# Patient Record
Sex: Female | Born: 1940 | Race: White | Hispanic: No | Smoking: Never smoker
Health system: Southern US, Community
[De-identification: ages and names within clinical notes are randomized; demographics above are authoritative.]

## PROBLEM LIST (undated history)

## (undated) DIAGNOSIS — C189 Malignant neoplasm of colon, unspecified: Secondary | ICD-10-CM

## (undated) DIAGNOSIS — I739 Peripheral vascular disease, unspecified: Secondary | ICD-10-CM

## (undated) DIAGNOSIS — F419 Anxiety disorder, unspecified: Secondary | ICD-10-CM

## (undated) DIAGNOSIS — E119 Type 2 diabetes mellitus without complications: Secondary | ICD-10-CM

## (undated) DIAGNOSIS — I10 Essential (primary) hypertension: Secondary | ICD-10-CM

## (undated) HISTORY — PX: TONSILLECTOMY: SUR1361

## (undated) HISTORY — PX: COLON SURGERY: SHX602

## (undated) SURGERY — ABDOMINAL AORTOGRAM W/LOWER EXTREMITY
Anesthesia: Moderate Sedation

---

## 2005-12-11 ENCOUNTER — Ambulatory Visit: Payer: Self-pay | Admitting: Internal Medicine

## 2007-10-14 ENCOUNTER — Ambulatory Visit: Payer: Self-pay | Admitting: Internal Medicine

## 2008-03-06 ENCOUNTER — Emergency Department: Payer: Self-pay | Admitting: Unknown Physician Specialty

## 2009-09-23 ENCOUNTER — Ambulatory Visit: Payer: Self-pay | Admitting: Internal Medicine

## 2010-06-28 ENCOUNTER — Emergency Department: Payer: Self-pay | Admitting: Emergency Medicine

## 2010-07-03 ENCOUNTER — Ambulatory Visit: Payer: Self-pay | Admitting: Surgery

## 2010-07-04 ENCOUNTER — Inpatient Hospital Stay: Payer: Self-pay | Admitting: Internal Medicine

## 2010-07-06 LAB — CEA: CEA: 20.4 ng/mL — ABNORMAL HIGH (ref 0.0–4.7)

## 2010-07-07 ENCOUNTER — Ambulatory Visit: Payer: Self-pay | Admitting: Surgery

## 2010-07-08 ENCOUNTER — Ambulatory Visit: Payer: Self-pay | Admitting: Internal Medicine

## 2010-07-11 LAB — PATHOLOGY REPORT

## 2010-07-27 ENCOUNTER — Ambulatory Visit: Payer: Self-pay | Admitting: Internal Medicine

## 2010-08-01 ENCOUNTER — Ambulatory Visit: Payer: Self-pay | Admitting: Internal Medicine

## 2010-08-02 ENCOUNTER — Ambulatory Visit: Payer: Self-pay | Admitting: Surgery

## 2010-08-08 ENCOUNTER — Ambulatory Visit: Payer: Self-pay | Admitting: Internal Medicine

## 2010-09-07 ENCOUNTER — Ambulatory Visit: Payer: Self-pay | Admitting: Internal Medicine

## 2010-09-12 ENCOUNTER — Ambulatory Visit: Payer: Self-pay | Admitting: Internal Medicine

## 2010-10-08 ENCOUNTER — Ambulatory Visit: Payer: Self-pay | Admitting: Internal Medicine

## 2010-10-26 LAB — CEA: CEA: 2.9 ng/mL (ref 0.0–4.7)

## 2010-11-08 ENCOUNTER — Ambulatory Visit: Payer: Self-pay | Admitting: Internal Medicine

## 2010-12-07 ENCOUNTER — Ambulatory Visit: Payer: Self-pay | Admitting: Internal Medicine

## 2011-01-07 ENCOUNTER — Ambulatory Visit: Payer: Self-pay | Admitting: Internal Medicine

## 2011-01-23 ENCOUNTER — Ambulatory Visit: Payer: Self-pay | Admitting: Unknown Physician Specialty

## 2011-01-24 LAB — PATHOLOGY REPORT

## 2011-02-06 ENCOUNTER — Ambulatory Visit: Payer: Self-pay | Admitting: Internal Medicine

## 2011-03-09 ENCOUNTER — Ambulatory Visit: Payer: Self-pay | Admitting: Internal Medicine

## 2011-04-08 ENCOUNTER — Ambulatory Visit: Payer: Self-pay | Admitting: Internal Medicine

## 2011-04-09 ENCOUNTER — Emergency Department: Payer: Self-pay | Admitting: Emergency Medicine

## 2011-05-05 IMAGING — CT NM PET TUM IMG INITIAL (PI) SKULL BASE T - THIGH
1 of 5 series · 1 of 25 positions shown · non-contrast
Comparison: none

REASON FOR EXAM: eval for liver METS
COMMENTS:

PROCEDURE:     PET - PET/CT INIT STAGING COLORECTAL  - August 01, 2010 [DATE]
RESULT:
HISTORY: Colorectal cancer.

[Series 3: ct wb 3.0 b30f · axial · 3.0mm · 0.98mm/px · 1 of 376 slices shown]
[im 334/376  brain]
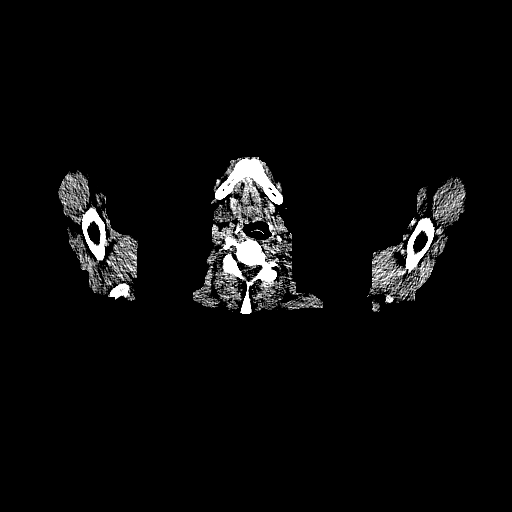

[1 of 25 positions shown; findings below may reference images not displayed]

Comparison Study: No prior.

Procedure and Findings: Following termination of a fasting blood sugar of
107 mg/dl, 14.42 mCi of F-18 FDG was administered to the patient and PET CT
obtained. CT was obtained for attenuation, correction and fusion. Comparison
is made to prior CT of 06/28/2010. Noted in the right lobe of the thyroid is
an intensely PET positive nodule with an SUV of approximately 6.2. Thyroid
malignancy cannot be excluded and further evaluation is suggested. A PET
positive small right adrenal nodular density with an SUV approximately 3 is
noted. Although this could represent adrenal adenoma an adrenal malignancy
cannot be excluded. Further characterization with MRI of the adrenal is
suggested.
IMPRESSION: 1. Intensely PET positive right thyroid lesion for which further evaluation
should be considered. Thyroid malignancy cannot be excluded.
2. Faintly PET positive right adrenal lesion. Although this could represent
adrenal adenoma an adrenal malignancy cannot be excluded and further
characterization with MRI is suggested.
3. No other significant focal abnormalities are identified. The liver
demonstrates no PET positive abnormalities. The previously identified
hepatic lesion on abdominal CT of 06/28/2010 can be followed with CT.

## 2011-05-09 ENCOUNTER — Ambulatory Visit: Payer: Self-pay | Admitting: Internal Medicine

## 2011-06-09 ENCOUNTER — Ambulatory Visit: Payer: Self-pay | Admitting: Internal Medicine

## 2011-07-04 LAB — CEA: CEA: 2.6 ng/mL (ref 0.0–4.7)

## 2011-07-09 ENCOUNTER — Ambulatory Visit: Payer: Self-pay | Admitting: Internal Medicine

## 2011-07-13 ENCOUNTER — Ambulatory Visit: Payer: Self-pay | Admitting: Otolaryngology

## 2011-08-09 ENCOUNTER — Ambulatory Visit: Payer: Self-pay | Admitting: Internal Medicine

## 2011-09-08 ENCOUNTER — Ambulatory Visit: Payer: Self-pay | Admitting: Internal Medicine

## 2011-10-11 ENCOUNTER — Ambulatory Visit: Payer: Self-pay | Admitting: Internal Medicine

## 2011-11-09 ENCOUNTER — Ambulatory Visit: Payer: Self-pay | Admitting: Internal Medicine

## 2011-11-09 ENCOUNTER — Ambulatory Visit: Payer: Self-pay | Admitting: Oncology

## 2011-11-13 LAB — COMPREHENSIVE METABOLIC PANEL
Anion Gap: 8 (ref 7–16)
BUN: 12 mg/dL (ref 7–18)
Calcium, Total: 8.7 mg/dL (ref 8.5–10.1)
Chloride: 106 mmol/L (ref 98–107)
Creatinine: 0.86 mg/dL (ref 0.60–1.30)
EGFR (African American): >60
Osmolality: 284 (ref 275–301)
Potassium: 4.6 mmol/L (ref 3.5–5.1)
SGOT(AST): 27 U/L (ref 15–37)
Total Protein: 6.8 g/dL (ref 6.4–8.2)

## 2011-11-13 LAB — CBC CANCER CENTER
Basophil #: 0 x10 3/mm (ref 0.0–0.1)
Basophil %: 0.5 %
Eosinophil %: 1.1 %
HGB: 11.1 g/dL — ABNORMAL LOW (ref 12.0–16.0)
Lymphocyte #: 1.8 x10 3/mm (ref 1.0–3.6)
Lymphocyte %: 23.6 %
MCH: 29.4 pg (ref 26.0–34.0)
MCV: 87 fL (ref 80–100)
Monocyte #: 0.3 x10 3/mm (ref 0.0–0.7)
Monocyte %: 4.4 %
Neutrophil %: 70.4 %
Platelet: 261 x10 3/mm (ref 150–440)
RDW: 13.3 % (ref 11.5–14.5)
WBC: 7.9 x10 3/mm (ref 3.6–11.0)

## 2011-11-14 LAB — CEA: CEA: 2.8 ng/mL (ref 0.0–4.7)

## 2011-12-07 ENCOUNTER — Ambulatory Visit: Payer: Self-pay | Admitting: Oncology

## 2011-12-07 ENCOUNTER — Ambulatory Visit: Payer: Self-pay | Admitting: Internal Medicine

## 2012-01-07 ENCOUNTER — Ambulatory Visit: Payer: Self-pay | Admitting: Internal Medicine

## 2012-02-05 LAB — HEPATIC FUNCTION PANEL A (ARMC)
Bilirubin, Direct: <0.1 mg/dL (ref 0.00–0.20)
SGOT(AST): 20 U/L (ref 15–37)
Total Protein: 7 g/dL (ref 6.4–8.2)

## 2012-02-05 LAB — CBC CANCER CENTER
Basophil %: 0.9 %
Eosinophil #: 0.2 x10 3/mm (ref 0.0–0.7)
Eosinophil %: 2.3 %
Lymphocyte #: 2.2 x10 3/mm (ref 1.0–3.6)
Lymphocyte %: 28.1 %
MCHC: 32.6 g/dL (ref 32.0–36.0)
Monocyte #: 0.4 x10 3/mm (ref 0.2–0.9)
Neutrophil %: 63.3 %
Platelet: 247 x10 3/mm (ref 150–440)
RBC: 4.03 10*6/uL (ref 3.80–5.20)

## 2012-02-05 LAB — CREATININE, SERUM: EGFR (African American): >60

## 2012-02-06 ENCOUNTER — Ambulatory Visit: Payer: Self-pay | Admitting: Internal Medicine

## 2012-02-06 LAB — CEA: CEA: 2.6 ng/mL (ref 0.0–4.7)

## 2012-03-18 ENCOUNTER — Ambulatory Visit: Payer: Self-pay | Admitting: Internal Medicine

## 2012-04-07 ENCOUNTER — Ambulatory Visit: Payer: Self-pay | Admitting: Internal Medicine

## 2012-06-10 ENCOUNTER — Ambulatory Visit: Payer: Self-pay | Admitting: Internal Medicine

## 2012-06-10 LAB — CBC CANCER CENTER
Basophil #: 0.1 x10 3/mm (ref 0.0–0.1)
Basophil %: 0.6 %
Eosinophil %: 0.7 %
HCT: 35.8 % (ref 35.0–47.0)
HGB: 11.5 g/dL — ABNORMAL LOW (ref 12.0–16.0)
Lymphocyte #: 2 x10 3/mm (ref 1.0–3.6)
Lymphocyte %: 19 %
MCH: 27.8 pg (ref 26.0–34.0)
MCHC: 32.2 g/dL (ref 32.0–36.0)
Monocyte %: 4.1 %
Neutrophil #: 7.9 x10 3/mm — ABNORMAL HIGH (ref 1.4–6.5)
Neutrophil %: 75.6 %
RBC: 4.15 10*6/uL (ref 3.80–5.20)
RDW: 14.2 % (ref 11.5–14.5)
WBC: 10.5 x10 3/mm (ref 3.6–11.0)

## 2012-06-10 LAB — HEPATIC FUNCTION PANEL A (ARMC)
Albumin: 3.4 g/dL (ref 3.4–5.0)
Bilirubin, Direct: <0.1 mg/dL (ref 0.00–0.20)
Bilirubin,Total: 0.4 mg/dL (ref 0.2–1.0)

## 2012-06-10 LAB — CREATININE, SERUM: EGFR (Non-African Amer.): 53 — ABNORMAL LOW

## 2012-06-13 LAB — CEA: CEA: 2.4 ng/mL (ref 0.0–4.7)

## 2012-07-08 ENCOUNTER — Ambulatory Visit: Payer: Self-pay | Admitting: Internal Medicine

## 2012-07-09 ENCOUNTER — Ambulatory Visit: Payer: Self-pay | Admitting: Internal Medicine

## 2012-07-10 ENCOUNTER — Ambulatory Visit: Payer: Self-pay | Admitting: Internal Medicine

## 2012-07-18 ENCOUNTER — Ambulatory Visit: Payer: Self-pay | Admitting: Otolaryngology

## 2012-11-24 ENCOUNTER — Ambulatory Visit: Payer: Self-pay | Admitting: Internal Medicine

## 2012-11-25 ENCOUNTER — Ambulatory Visit: Payer: Self-pay | Admitting: Internal Medicine

## 2012-11-25 LAB — HEPATIC FUNCTION PANEL A (ARMC)
Bilirubin, Direct: <0.05 mg/dL (ref 0.00–0.20)
Bilirubin,Total: 0.2 mg/dL (ref 0.2–1.0)

## 2012-11-25 LAB — CBC CANCER CENTER
Basophil #: 0.1 x10 3/mm (ref 0.0–0.1)
Eosinophil #: 0.1 x10 3/mm (ref 0.0–0.7)
HCT: 38.9 % (ref 35.0–47.0)
HGB: 12.9 g/dL (ref 12.0–16.0)
MCH: 28.2 pg (ref 26.0–34.0)
Monocyte #: 0.4 x10 3/mm (ref 0.2–0.9)
Monocyte %: 4.3 %
Neutrophil #: 6.4 x10 3/mm (ref 1.4–6.5)
Platelet: 276 x10 3/mm (ref 150–440)
RBC: 4.6 10*6/uL (ref 3.80–5.20)
RDW: 14.1 % (ref 11.5–14.5)
WBC: 8.9 x10 3/mm (ref 3.6–11.0)

## 2012-11-25 LAB — CREATININE, SERUM
Creatinine: 1.06 mg/dL (ref 0.60–1.30)
EGFR (African American): 60 — ABNORMAL LOW

## 2012-11-27 LAB — CEA: CEA: 2.1 ng/mL (ref 0.0–4.7)

## 2012-12-06 ENCOUNTER — Ambulatory Visit: Payer: Self-pay | Admitting: Internal Medicine

## 2013-03-26 ENCOUNTER — Ambulatory Visit: Payer: Self-pay | Admitting: Unknown Physician Specialty

## 2013-05-08 ENCOUNTER — Ambulatory Visit: Payer: Self-pay | Admitting: Internal Medicine

## 2013-05-25 LAB — HEPATIC FUNCTION PANEL A (ARMC)
Albumin: 3.3 g/dL — ABNORMAL LOW (ref 3.4–5.0)
Bilirubin,Total: 0.4 mg/dL (ref 0.2–1.0)
SGOT(AST): 13 U/L — ABNORMAL LOW (ref 15–37)
SGPT (ALT): 15 U/L (ref 12–78)
Total Protein: 6.9 g/dL (ref 6.4–8.2)

## 2013-05-25 LAB — CBC CANCER CENTER
Basophil #: 0.1 x10 3/mm (ref 0.0–0.1)
Basophil %: 0.9 %
Eosinophil %: 1.2 %
HGB: 12.5 g/dL (ref 12.0–16.0)
Lymphocyte #: 2 x10 3/mm (ref 1.0–3.6)
MCH: 29.4 pg (ref 26.0–34.0)
MCV: 85 fL (ref 80–100)
Neutrophil #: 6.7 x10 3/mm — ABNORMAL HIGH (ref 1.4–6.5)
Neutrophil %: 71.9 %
Platelet: 283 x10 3/mm (ref 150–440)
RBC: 4.25 10*6/uL (ref 3.80–5.20)
RDW: 14 % (ref 11.5–14.5)
WBC: 9.3 x10 3/mm (ref 3.6–11.0)

## 2013-05-25 LAB — CREATININE, SERUM
EGFR (African American): 53 — ABNORMAL LOW
EGFR (Non-African Amer.): 46 — ABNORMAL LOW

## 2013-05-26 LAB — CEA: CEA: 2 ng/mL (ref 0.0–4.7)

## 2013-06-08 ENCOUNTER — Ambulatory Visit: Payer: Self-pay | Admitting: Internal Medicine

## 2013-07-21 ENCOUNTER — Ambulatory Visit: Payer: Self-pay | Admitting: Otolaryngology

## 2013-12-02 ENCOUNTER — Ambulatory Visit: Payer: Self-pay | Admitting: Internal Medicine

## 2013-12-04 ENCOUNTER — Ambulatory Visit: Payer: Self-pay | Admitting: Internal Medicine

## 2013-12-04 LAB — CBC CANCER CENTER
BASOS ABS: 0.1 x10 3/mm (ref 0.0–0.1)
Basophil %: 0.6 %
EOS ABS: 0.2 x10 3/mm (ref 0.0–0.7)
Eosinophil %: 1.9 %
HCT: 41 % (ref 35.0–47.0)
HGB: 13.2 g/dL (ref 12.0–16.0)
Lymphocyte #: 2.3 x10 3/mm (ref 1.0–3.6)
Lymphocyte %: 24.7 %
MCH: 27.6 pg (ref 26.0–34.0)
MCHC: 32.3 g/dL (ref 32.0–36.0)
MCV: 85 fL (ref 80–100)
Monocyte #: 0.4 x10 3/mm (ref 0.2–0.9)
Monocyte %: 4.5 %
Neutrophil #: 6.3 x10 3/mm (ref 1.4–6.5)
Neutrophil %: 68.3 %
PLATELETS: 324 x10 3/mm (ref 150–440)
RBC: 4.8 10*6/uL (ref 3.80–5.20)
RDW: 14.1 % (ref 11.5–14.5)
WBC: 9.2 x10 3/mm (ref 3.6–11.0)

## 2013-12-04 LAB — CREATININE, SERUM
Creatinine: 1.1 mg/dL (ref 0.60–1.30)
EGFR (African American): 57 — ABNORMAL LOW
EGFR (Non-African Amer.): 49 — ABNORMAL LOW

## 2013-12-04 LAB — HEPATIC FUNCTION PANEL A (ARMC)
ALBUMIN: 3.6 g/dL (ref 3.4–5.0)
AST: 15 U/L (ref 15–37)
Alkaline Phosphatase: 112 U/L
Bilirubin, Direct: 0.1 mg/dL (ref 0.00–0.20)
Bilirubin,Total: 0.3 mg/dL (ref 0.2–1.0)
SGPT (ALT): 19 U/L (ref 12–78)
TOTAL PROTEIN: 7.6 g/dL (ref 6.4–8.2)

## 2013-12-06 ENCOUNTER — Ambulatory Visit: Payer: Self-pay | Admitting: Internal Medicine

## 2013-12-07 LAB — CEA: CEA: 2.2 ng/mL (ref 0.0–4.7)

## 2014-11-22 ENCOUNTER — Ambulatory Visit: Payer: Self-pay | Admitting: Internal Medicine

## 2014-12-02 ENCOUNTER — Ambulatory Visit: Payer: Self-pay | Admitting: Internal Medicine

## 2014-12-07 ENCOUNTER — Ambulatory Visit
Admit: 2014-12-07 | Disposition: A | Discharge status: Home / Self Care | Payer: Self-pay | Attending: Internal Medicine | Admitting: Internal Medicine

## 2015-01-21 ENCOUNTER — Other Ambulatory Visit: Payer: Self-pay | Admitting: Internal Medicine

## 2015-01-21 DIAGNOSIS — C189 Malignant neoplasm of colon, unspecified: Secondary | ICD-10-CM

## 2015-12-06 ENCOUNTER — Inpatient Hospital Stay: Payer: Medicare Other | Attending: Family Medicine

## 2015-12-06 ENCOUNTER — Inpatient Hospital Stay: Payer: Medicare Other | Admitting: Family Medicine

## 2015-12-06 ENCOUNTER — Ambulatory Visit: Payer: Self-pay | Admitting: Internal Medicine

## 2016-01-04 ENCOUNTER — Inpatient Hospital Stay
Admission: AD | Admit: 2016-01-04 | Discharge: 2016-01-06 | DRG: 253 | Disposition: A | Discharge status: Home / Self Care | Payer: Medicare Other | Source: Ambulatory Visit | Attending: Internal Medicine | Admitting: Internal Medicine

## 2016-01-04 DIAGNOSIS — E1152 Type 2 diabetes mellitus with diabetic peripheral angiopathy with gangrene: Principal | ICD-10-CM | POA: Diagnosis present

## 2016-01-04 DIAGNOSIS — L97509 Non-pressure chronic ulcer of other part of unspecified foot with unspecified severity: Secondary | ICD-10-CM

## 2016-01-04 DIAGNOSIS — Z794 Long term (current) use of insulin: Secondary | ICD-10-CM | POA: Diagnosis not present

## 2016-01-04 DIAGNOSIS — E871 Hypo-osmolality and hyponatremia: Secondary | ICD-10-CM | POA: Diagnosis present

## 2016-01-04 DIAGNOSIS — E1122 Type 2 diabetes mellitus with diabetic chronic kidney disease: Secondary | ICD-10-CM | POA: Diagnosis present

## 2016-01-04 DIAGNOSIS — N182 Chronic kidney disease, stage 2 (mild): Secondary | ICD-10-CM | POA: Diagnosis present

## 2016-01-04 DIAGNOSIS — I771 Stricture of artery: Secondary | ICD-10-CM | POA: Diagnosis present

## 2016-01-04 DIAGNOSIS — L03115 Cellulitis of right lower limb: Secondary | ICD-10-CM | POA: Diagnosis present

## 2016-01-04 DIAGNOSIS — E11621 Type 2 diabetes mellitus with foot ulcer: Secondary | ICD-10-CM | POA: Diagnosis present

## 2016-01-04 DIAGNOSIS — Z86718 Personal history of other venous thrombosis and embolism: Secondary | ICD-10-CM | POA: Diagnosis not present

## 2016-01-04 DIAGNOSIS — Z833 Family history of diabetes mellitus: Secondary | ICD-10-CM | POA: Diagnosis not present

## 2016-01-04 DIAGNOSIS — I129 Hypertensive chronic kidney disease with stage 1 through stage 4 chronic kidney disease, or unspecified chronic kidney disease: Secondary | ICD-10-CM | POA: Diagnosis present

## 2016-01-04 DIAGNOSIS — Z85038 Personal history of other malignant neoplasm of large intestine: Secondary | ICD-10-CM | POA: Diagnosis not present

## 2016-01-04 DIAGNOSIS — E1140 Type 2 diabetes mellitus with diabetic neuropathy, unspecified: Secondary | ICD-10-CM | POA: Diagnosis present

## 2016-01-04 HISTORY — DX: Essential (primary) hypertension: I10

## 2016-01-04 HISTORY — DX: Type 2 diabetes mellitus without complications: E11.9

## 2016-01-04 HISTORY — DX: Malignant neoplasm of colon, unspecified: C18.9

## 2016-01-04 LAB — BASIC METABOLIC PANEL
Anion gap: 8 (ref 5–15)
BUN: 17 mg/dL (ref 6–20)
CO2: 22 mmol/L (ref 22–32)
CREATININE: 1.1 mg/dL — AB (ref 0.44–1.00)
Calcium: 8.6 mg/dL — ABNORMAL LOW (ref 8.9–10.3)
Chloride: 102 mmol/L (ref 101–111)
GFR calc Af Amer: 55 mL/min — ABNORMAL LOW (ref >60)
GFR, EST NON AFRICAN AMERICAN: 47 mL/min — AB (ref >60)
GLUCOSE: 153 mg/dL — AB (ref 65–99)
POTASSIUM: 3.7 mmol/L (ref 3.5–5.1)
SODIUM: 132 mmol/L — AB (ref 135–145)

## 2016-01-04 LAB — CBC
HEMATOCRIT: 34.4 % — AB (ref 35.0–47.0)
Hemoglobin: 11.3 g/dL — ABNORMAL LOW (ref 12.0–16.0)
MCH: 26.8 pg (ref 26.0–34.0)
MCHC: 32.8 g/dL (ref 32.0–36.0)
MCV: 81.8 fL (ref 80.0–100.0)
PLATELETS: 547 10*3/uL — AB (ref 150–440)
RBC: 4.2 MIL/uL (ref 3.80–5.20)
RDW: 14.4 % (ref 11.5–14.5)
WBC: 17.6 10*3/uL — AB (ref 3.6–11.0)

## 2016-01-04 LAB — GLUCOSE, CAPILLARY: Glucose-Capillary: 220 mg/dL — ABNORMAL HIGH (ref 65–99)

## 2016-01-04 LAB — SURGICAL PCR SCREEN
MRSA, PCR: NEGATIVE
STAPHYLOCOCCUS AUREUS: NEGATIVE

## 2016-01-04 MED ORDER — VANCOMYCIN HCL IN DEXTROSE 1-5 GM/200ML-% IV SOLN
1000.0000 mg | INTRAVENOUS | Status: DC
  Administered 2016-01-05 – 2016-01-06 (×2): 1000 mg via INTRAVENOUS
  Filled 2016-01-04 (×3): qty 200

## 2016-01-04 MED ORDER — ONDANSETRON HCL 4 MG/2ML IJ SOLN: 4.0000 mg | Freq: Four times a day (QID) | INTRAMUSCULAR | Status: DC | PRN

## 2016-01-04 MED ORDER — PIPERACILLIN-TAZOBACTAM 3.375 G IVPB
3.3750 g | Freq: Once | INTRAVENOUS | Status: AC
  Administered 2016-01-04: 3.375 g via INTRAVENOUS
  Filled 2016-01-04: qty 50

## 2016-01-04 MED ORDER — ENOXAPARIN SODIUM 30 MG/0.3ML ~~LOC~~ SOLN: 30.0000 mg | SUBCUTANEOUS | Status: DC

## 2016-01-04 MED ORDER — DOCUSATE SODIUM 100 MG PO CAPS
100.0000 mg | ORAL_CAPSULE | Freq: Two times a day (BID) | ORAL | Status: DC
  Administered 2016-01-04 – 2016-01-06 (×4): 100 mg via ORAL
  Filled 2016-01-04 (×4): qty 1

## 2016-01-04 MED ORDER — VANCOMYCIN HCL IN DEXTROSE 1-5 GM/200ML-% IV SOLN: 1000.0000 mg | INTRAVENOUS | Status: DC

## 2016-01-04 MED ORDER — BISACODYL 5 MG PO TBEC: 5.0000 mg | DELAYED_RELEASE_TABLET | Freq: Every day | ORAL | Status: DC | PRN

## 2016-01-04 MED ORDER — ENOXAPARIN SODIUM 40 MG/0.4ML ~~LOC~~ SOLN
40.0000 mg | SUBCUTANEOUS | Status: DC
  Administered 2016-01-05: 40 mg via SUBCUTANEOUS
  Filled 2016-01-04: qty 0.4

## 2016-01-04 MED ORDER — ACETAMINOPHEN 325 MG PO TABS: 650.0000 mg | ORAL_TABLET | Freq: Four times a day (QID) | ORAL | Status: DC | PRN

## 2016-01-04 MED ORDER — ACETAMINOPHEN 650 MG RE SUPP: 650.0000 mg | Freq: Four times a day (QID) | RECTAL | Status: DC | PRN

## 2016-01-04 MED ORDER — INSULIN ASPART 100 UNIT/ML ~~LOC~~ SOLN
0.0000 [IU] | Freq: Three times a day (TID) | SUBCUTANEOUS | Status: DC
  Administered 2016-01-05: 8 [IU] via SUBCUTANEOUS
  Administered 2016-01-05: 5 [IU] via SUBCUTANEOUS
  Administered 2016-01-05 – 2016-01-06 (×4): 3 [IU] via SUBCUTANEOUS
  Filled 2016-01-04: qty 3
  Filled 2016-01-04: qty 5
  Filled 2016-01-04: qty 3
  Filled 2016-01-04: qty 8
  Filled 2016-01-04 (×2): qty 3

## 2016-01-04 MED ORDER — HYDROCODONE-ACETAMINOPHEN 5-325 MG PO TABS: 1.0000 | ORAL_TABLET | ORAL | Status: DC | PRN

## 2016-01-04 MED ORDER — SODIUM CHLORIDE 0.9 % IV SOLN
INTRAVENOUS | Status: DC
  Administered 2016-01-04 – 2016-01-05 (×2): via INTRAVENOUS

## 2016-01-04 MED ORDER — VANCOMYCIN HCL IN DEXTROSE 1-5 GM/200ML-% IV SOLN
1000.0000 mg | INTRAVENOUS | Status: AC
  Administered 2016-01-04: 1000 mg via INTRAVENOUS
  Filled 2016-01-04: qty 200

## 2016-01-04 MED ORDER — ONDANSETRON HCL 4 MG PO TABS: 4.0000 mg | ORAL_TABLET | Freq: Four times a day (QID) | ORAL | Status: DC | PRN

## 2016-01-04 NOTE — Progress Notes (Signed)
ANTIBIOTIC CONSULT NOTE - INITIAL  Pharmacy Consult for Vancomycin and Zosyn  Indication: wound infection  No Known Allergies  Patient Measurements: Height: 5\' 2"  (157.5 cm) Weight: 156 lb 1.6 oz (70.806 kg) IBW/kg (Calculated) : 50.1 Adjusted Body Weight:   Vital Signs: Temp: 99.9 F (37.7 C) (03/29 2104) Temp Source: Oral (03/29 2104) BP: 153/49 mmHg (03/29 2104) Pulse Rate: 71 (03/29 2104) Intake/Output from previous day:   Intake/Output from this shift:    Labs:  Recent Labs  01/04/16 1804  WBC 17.6*  HGB 11.3*  PLT 547*  CREATININE 1.10*   Estimated Creatinine Clearance: 39.5 mL/min (by C-G formula based on Cr of 1.1). No results for input(s): VANCOTROUGH, VANCOPEAK, VANCORANDOM, GENTTROUGH, GENTPEAK, GENTRANDOM, TOBRATROUGH, TOBRAPEAK, TOBRARND, AMIKACINPEAK, AMIKACINTROU, AMIKACIN in the last 72 hours.   Microbiology: No results found for this or any previous visit (from the past 720 hour(s)).  Medical History: History reviewed. No pertinent past medical history.  Medications:  Prescriptions prior to admission  Medication Sig Dispense Refill Last Dose  . insulin lispro protamine-lispro (HUMALOG 75/25 MIX) (75-25) 100 UNIT/ML SUSP injection Inject 40 Units into the skin.   01/04/2016 at Unknown time  . lisinopril (PRINIVIL,ZESTRIL) 20 MG tablet Take 20 mg by mouth daily.   01/04/2016 at Unknown time  . simvastatin (ZOCOR) 20 MG tablet Take 20 mg by mouth daily.      Scheduled:  . docusate sodium  100 mg Oral BID  . enoxaparin (LOVENOX) injection  40 mg Subcutaneous Q24H  . [START ON 01/05/2016] insulin aspart  0-15 Units Subcutaneous TID WC  . piperacillin-tazobactam (ZOSYN)  IV  3.375 g Intravenous Once  . [START ON 01/05/2016] vancomycin  1,000 mg Intravenous Q24H   Assessment: Pharmacy consulted to dose and monitor Vancomycin and Zosyn therapy in this 75 year old female being treated for right great toe infection secondary to Type II DM. Podiatry consult  has been placed by hospitalist  PK parameters: Kel (hr-1): 0.033 Half-life (hrs): 21.00 Vd (liters): 49.56 (factor used: 0.7 L/kg)   Will give Vancomycin 1 g IV x 1 then will start Vancomycin 1 g IV q24 hours ~10 hours after the initial dose of Vancomycin.  Zosyn: Ordered Zosyn 3.375 g IV x 1 and Will then start Zosyn 3.375 g IV q8 hours.   Will draw 1st Vanc trough on 4/2 @ 6:00, which will not be at [facility].    Goal of Therapy:  Vancomycin Trough level target 15-20 mcg/ml.   Plan:  Follow up culture results     Myley Bahner D 01/04/2016,9:53 PM

## 2016-01-04 NOTE — Progress Notes (Signed)
ANTIBIOTIC CONSULT NOTE - INITIAL  Pharmacy Consult for Vancomycin and Zosyn  Indication: wound infection  No Known Allergies  Patient Measurements: Height: 5\' 2"  (157.5 cm) Weight: 156 lb 1.6 oz (70.806 kg) IBW/kg (Calculated) : 50.1 Adjusted Body Weight:   Vital Signs: Temp: 99 F (37.2 C) (03/29 1741) Temp Source: Oral (03/29 1741) BP: 115/67 mmHg (03/29 1741) Pulse Rate: 70 (03/29 1741) Intake/Output from previous day:   Intake/Output from this shift:    Labs:  Recent Labs  01/04/16 1804  WBC 17.6*  HGB 11.3*  PLT 547*  CREATININE 1.10*   Estimated Creatinine Clearance: 39.5 mL/min (by C-G formula based on Cr of 1.1). No results for input(s): VANCOTROUGH, VANCOPEAK, VANCORANDOM, GENTTROUGH, GENTPEAK, GENTRANDOM, TOBRATROUGH, TOBRAPEAK, TOBRARND, AMIKACINPEAK, AMIKACINTROU, AMIKACIN in the last 72 hours.   Microbiology: No results found for this or any previous visit (from the past 720 hour(s)).  Medical History: No past medical history on file.  Medications:  No prescriptions prior to admission   Scheduled:  . docusate sodium  100 mg Oral BID  . enoxaparin (LOVENOX) injection  30 mg Subcutaneous Q24H  . [START ON 01/05/2016] insulin aspart  0-15 Units Subcutaneous TID WC  . piperacillin-tazobactam (ZOSYN)  IV  3.375 g Intravenous Once  . vancomycin  1,000 mg Intravenous STAT   Assessment: Pharmacy consulted to dose and monitor Vancomycin and Zosyn therapy in this 75 year old female being treated for right great toe infection secondary to Type II DM. Podiatry consult has been placed by hospitalist  PK parameters: Kel (hr-1): 0.033 Half-life (hrs): 21.00 Vd (liters): 49.56 (factor used: 0.7 L/kg)   Will give Vancomycin 1 g IV x 1 then will start Vancomycin 1 g IV q24 hours ~10 hours after the initial dose of Vancomycin.  Zosyn: Ordered Zosyn 3.375 g IV x 1 and Will then start Zosyn 3.375 g IV q8 hours.    Goal of Therapy:  Vancomycin Trough level  target 15-20 mcg/ml.   Plan:  Follow up culture results  Alexismarie Flaim D 01/04/2016,7:04 PM

## 2016-01-04 NOTE — H&P (Signed)
[facility] [place] NAME: Beth Mcguire    MR#:  [id number]  DATE OF BIRTH:  January 22, 1941  DATE OF ADMISSION:  01/04/2016  PRIMARY CARE PHYSICIAN: Beth Noble, MD    REQUESTING/REFERRING PHYSICIAN:Dr.Cline  CHIEF COMPLAINT:  No chief complaint on file.   HISTORY OF PRESENT ILLNESS:  Beth Mcguire  is a 75 y.o. female with a known history of  DMII, htn sent in from her at the office because of worsening great toe infection on the right great toe. Patient is referred for direct admission because of possible left chronic infection of the right great toe with poor  Circulation.the patient says it started  Like a  a blister on the right great toe  with a worsening redness, swelling, pain ,her primary doctor prescribed amoxicillin. ML Dr. patient's and has 1 more day to take but because of black discoloration associated with worsening pain she was sent to  Podiatrist. No fever.  PAST MEDICAL HISTORY:  No past medical history on file. history of hypertension, diabetes mellitus.  PAST SURGICAL HISTOIRY:  No past surgical history on file. History of colon surgery before. History of colon cancer.  SOCIAL HISTORY:  No smoking or drinking no drugs  Social History  Substance Use Topics  . Smoking status: Not on file  . Smokeless tobacco: Not on file  . Alcohol Use: Not on file    FAMILY HISTORY:  No family history on file. Grandmother has diabetes  DRUG ALLERGIES:  Allergies not on fileNo known allergies CONSTITUTIONAL: No fever, fatigue or weakness.  EYES: No blurred or double vision.  EARS, NOSE, AND THROAT: No tinnitus or ear pain.  RESPIRATORY: No cough, shortness of breath, wheezing or hemoptysis.  CARDIOVASCULAR: No chest pain, orthopnea, edema.  GASTROINTESTINAL: No nausea, vomiting, diarrhea or abdominal pain.  GENITOURINARY: No dysuria, hematuria.  ENDOCRINE: No polyuria, nocturia,  HEMATOLOGY: No anemia, easy bruising or  bleeding SKIN: No rash or lesion. MUSCULOSKELET;Patient has area of redness tenderness and black discoloration on the right great toe.NEUROLOGIC: No tingling, numbness, weakness.  PSYCHIATRY: No anxiety or depression.   MEDICATIONS AT HOME:   Prior to Admission medications   Not on File      VITAL SIGNS:  Blood pressure 115/67, pulse 70, temperature 99 F (37.2 C), temperature source Oral, resp. rate 18, SpO2 100 %.  PHYSICAL EXAMINATION:  GENERAL:  75 y.o.-year-old patient lying in the bed with no acute distress.  EYES: Pupils equal, round, reactive to light and accommodation. No scleral icterus. Extraocular muscles intact.  HEENT: Head atraumatic, normocephalic. Oropharynx and nasopharynx clear.  NECK:  Supple, no jugular venous distention. No thyroid enlargement, no tenderness.  LUNGS: Normal breath sounds bilaterally, no wheezing, rales,rhonchi or crepitation. No use of accessory muscles of respiration.  CARDIOVASCULAR: S1, S2 normal. No murmurs, rubs, or gallops.  ABDOMEN: Soft, nontender, nondistended. Bowel sounds present. No organomegaly or mass.  EXTREMITIES:  Erythema, edema, pain on the right great toe with black discoloration and open area without drainage, right great toe is discolored to black, with poor circulation dorsalis pedis on the right foot. NEUROLOGIC: Cranial nerves II through XII are intact. Muscle strength 5/5 in all extremities. Sensation intact. Gait not checked.  PSYCHIATRIC: The patient is alert and oriented x 3.  SKIN: No obvious rash, lesion, or ulcer.   LABORATORY PANEL:   CBC No results for input(s): WBC, HGB, HCT, PLT in the last 168 hours. ------------------------------------------------------------------------------------------------------------------  Chemistries  No results for input(s): NA, K, CL, CO2, GLUCOSE, BUN, CREATININE, CALCIUM, MG, AST, ALT, ALKPHOS, BILITOT in the last 168 hours.  Invalid input(s):  GFRCGP ------------------------------------------------------------------------------------------------------------------  Cardiac Enzymes No results for input(s): TROPONINI in the last 168 hours. ------------------------------------------------------------------------------------------------------------------  RADIOLOGY:  No results found.  EKG:   Orders placed or performed in visit on 07/04/10  . EKG 12-Lead    IMPRESSION AND PLAN:  #1 right great toe infection secondary to diabetes mellitus type 2: Failed outpatient therapy with amoxicillin: Started on IV vancomycin, Zosyn, podiatry consult, vascular consult. Obtain CBC ,BMP., blood cultures, EKG. #2 peripheral vascular disease with possible right toe gangrene: Vascular consult, dorsalis pedis is absent on the right foot. #3 diabetes mellitus type 2: get  home medication list, right now start on sliding scale with coverage. #4 pain in the right great toe due to infection: Use pain medicine as needed. #5 GI, DVT prophylaxis.  D/w  this with patient's family.    All the records are reviewed and case discussed with ED provider. Management plans discussed with the patient, family and they are in agreement.  CODE STATUS: full  TOTAL TIME TAKING CARE OF THIS PATIENT: 15minutes.    Beth Mcguire M.D on 01/04/2016 at 5:56 PM  Between 7am to 6pm - Pager - [phone]  After 6pm go to [email or web address] - password [email or web address] [facility]  Office  [phone]  CC: Primary care physician; Beth Noble, MD  Note: This dictation was prepared with Dragon dictation along with smaller phrase technology. Any transcriptional errors that result from this process are unintentional.

## 2016-01-04 NOTE — Consult Note (Signed)
Reason for Consult: Diabetic ulcer with cellulitis right foot Referring Physician: [facility]  Beth Mcguire is an 75 y.o. female.  HPI: The patient relates a sore on her right foot is been going on for about a month. Started with what appeared to be a blister but then began to ulcerate. She was seen by her primary care physician a couple of weeks ago and placed on Augmentin. Was seen on follow-up this past Monday with no significant improvement and referred to podiatry. Outpatient she was noted to have continued significant cellulitis with evidence for vascular compromise and decision was made to admit for IV antibiotics and vascular workup as well as possible debridement  No past medical history on file.  No past surgical history on file.  No family history on file.  Social History:  has no tobacco, alcohol, and drug history on file.  Allergies: No Known Allergies  Medications:  Scheduled: . docusate sodium  100 mg Oral BID  . enoxaparin (LOVENOX) injection  40 mg Subcutaneous Q24H  . [START ON 01/05/2016] insulin aspart  0-15 Units Subcutaneous TID WC  . piperacillin-tazobactam (ZOSYN)  IV  3.375 g Intravenous Once  . vancomycin  1,000 mg Intravenous STAT    Results for orders placed or performed during the hospital encounter of 01/04/16 (from the past 48 hour(s))  Basic metabolic panel     Status: Abnormal   Collection Time: 01/04/16  6:04 PM  Result Value Ref Range   Sodium 132 (L) 135 - 145 mmol/L   Potassium 3.7 3.5 - 5.1 mmol/L   Chloride 102 101 - 111 mmol/L   CO2 22 22 - 32 mmol/L   Glucose, Bld 153 (H) 65 - 99 mg/dL   BUN 17 6 - 20 mg/dL   Creatinine, Ser 1.10 (H) 0.44 - 1.00 mg/dL   Calcium 8.6 (L) 8.9 - 10.3 mg/dL   GFR calc non Af Amer 47 (L) >60 mL/min   GFR calc Af Amer 55 (L) >60 mL/min    Comment: (NOTE) The eGFR has been calculated using the CKD EPI equation. This calculation has not been validated in all clinical situations. eGFR's  persistently <60 mL/min signify possible Chronic Kidney Disease.    Anion gap 8 5 - 15  CBC     Status: Abnormal   Collection Time: 01/04/16  6:04 PM  Result Value Ref Range   WBC 17.6 (H) 3.6 - 11.0 K/uL   RBC 4.20 3.80 - 5.20 MIL/uL   Hemoglobin 11.3 (L) 12.0 - 16.0 g/dL   HCT 34.4 (L) 35.0 - 47.0 %   MCV 81.8 80.0 - 100.0 fL   MCH 26.8 26.0 - 34.0 pg   MCHC 32.8 32.0 - 36.0 g/dL   RDW 14.4 11.5 - 14.5 %   Platelets 547 (H) 150 - 440 K/uL    No results found.  Review of Systems  Constitutional: Negative.   HENT: Negative.   Eyes: Negative.   Respiratory: Negative.   Cardiovascular: Negative.   Gastrointestinal: Negative.   Genitourinary: Negative.   Musculoskeletal:       Some pain in the right foot around the great toe joint or ulceration  Skin:       Redness and swelling in her right foot with a draining ulcer for the last few weeks  Neurological:       Relates some numbness and tingling in the feet associated with her diabetes  Endo/Heme/Allergies: Negative.   Psychiatric/Behavioral: Negative.    Blood  pressure 115/67, pulse 70, temperature 99 F (37.2 C), temperature source Oral, resp. rate 18, height '5\' 2"'  (1.575 m), weight 70.806 kg (156 lb 1.6 oz), SpO2 100 %. Physical Exam  Cardiovascular:  DP pulses trace on the left and thready and barely palpable on the right. PT pulses nonpalpable bilateral  Musculoskeletal:  Adequate range of motion the pedal joints. There is pain on palpation around the right great toe joint area.  Neurological:  Complete loss of protective threshold with a monofilament wire in the forefoot and toes bilateral. Proprioception is absent  Skin:  Significant erythema and edema in the entire right foot extending up to the level of the ankle. A necrotic-appearing ulcerations noted over the medial first metatarsal head. Full thickness underlying ulceration measuring approximately 3 cm x 2 cm. Some cyanotic appearance is noted to the hallux on  the right.    Assessment/Plan: Assessment: 1. Full-thickness ulceration right foot with significant cellulitis. 2. Diabetes with associated neuropathy. 3. Evidence for peripheral vascular disease.  Plan: The patient was discussed with vascular surgery and they are going to be able to get her in tomorrow for an angioma procedure to evaluate her circulation. She may need some type of debridement or possible amputation depending on her vascular status and clinical course over the next few days. We may also consider MRI to evaluate for deeper infection or abscess. Her outpatient x-rays were negative for any obvious bone involvement. We will follow her closely after her vascular procedure.  Durward Fortes 01/04/2016, 7:20 PM

## 2016-01-05 ENCOUNTER — Encounter: Payer: Self-pay | Admitting: Internal Medicine

## 2016-01-05 ENCOUNTER — Encounter
Admission: AD | Disposition: A | Discharge status: Home / Self Care | Payer: Self-pay | Source: Ambulatory Visit | Attending: Internal Medicine

## 2016-01-05 HISTORY — PX: PERIPHERAL VASCULAR CATHETERIZATION: SHX172C

## 2016-01-05 LAB — BASIC METABOLIC PANEL
Anion gap: 7 (ref 5–15)
BUN: 17 mg/dL (ref 6–20)
CHLORIDE: 104 mmol/L (ref 101–111)
CO2: 20 mmol/L — AB (ref 22–32)
CREATININE: 1.05 mg/dL — AB (ref 0.44–1.00)
Calcium: 8 mg/dL — ABNORMAL LOW (ref 8.9–10.3)
GFR calc Af Amer: 58 mL/min — ABNORMAL LOW (ref >60)
GFR calc non Af Amer: 50 mL/min — ABNORMAL LOW (ref >60)
Glucose, Bld: 236 mg/dL — ABNORMAL HIGH (ref 65–99)
POTASSIUM: 4.1 mmol/L (ref 3.5–5.1)
SODIUM: 131 mmol/L — AB (ref 135–145)

## 2016-01-05 LAB — CBC
HCT: 30.1 % — ABNORMAL LOW (ref 35.0–47.0)
Hemoglobin: 10 g/dL — ABNORMAL LOW (ref 12.0–16.0)
MCH: 26.9 pg (ref 26.0–34.0)
MCHC: 33.3 g/dL (ref 32.0–36.0)
MCV: 80.7 fL (ref 80.0–100.0)
PLATELETS: 490 10*3/uL — AB (ref 150–440)
RBC: 3.73 MIL/uL — ABNORMAL LOW (ref 3.80–5.20)
RDW: 14.4 % (ref 11.5–14.5)
WBC: 13.5 10*3/uL — AB (ref 3.6–11.0)

## 2016-01-05 LAB — GLUCOSE, CAPILLARY
GLUCOSE-CAPILLARY: 151 mg/dL — AB (ref 65–99)
Glucose-Capillary: 272 mg/dL — ABNORMAL HIGH (ref 65–99)
Glucose-Capillary: 224 mg/dL — ABNORMAL HIGH (ref 65–99)
Glucose-Capillary: 227 mg/dL — ABNORMAL HIGH (ref 65–99)

## 2016-01-05 LAB — HEMOGLOBIN A1C: Hgb A1c MFr Bld: 9.5 % — ABNORMAL HIGH (ref 4.0–6.0)

## 2016-01-05 SURGERY — LOWER EXTREMITY ANGIOGRAPHY
Anesthesia: Moderate Sedation

## 2016-01-05 MED ORDER — MIDAZOLAM HCL 5 MG/5ML IJ SOLN
INTRAMUSCULAR | Status: AC
  Filled 2016-01-05: qty 5

## 2016-01-05 MED ORDER — SODIUM CHLORIDE 0.9 % IV SOLN: INTRAVENOUS | Status: DC

## 2016-01-05 MED ORDER — FENTANYL CITRATE (PF) 100 MCG/2ML IJ SOLN
INTRAMUSCULAR | Status: AC
  Filled 2016-01-05: qty 2

## 2016-01-05 MED ORDER — HYDROMORPHONE HCL 1 MG/ML IJ SOLN
1.0000 mg | Freq: Once | INTRAMUSCULAR | Status: AC
  Administered 2016-01-05: 1 mg via INTRAVENOUS

## 2016-01-05 MED ORDER — HYDROMORPHONE HCL 1 MG/ML IJ SOLN
INTRAMUSCULAR | Status: AC
  Administered 2016-01-05: 1 mg via INTRAVENOUS
  Filled 2016-01-05: qty 1

## 2016-01-05 MED ORDER — LIDOCAINE-EPINEPHRINE (PF) 1 %-1:200000 IJ SOLN
INTRAMUSCULAR | Status: DC | PRN
  Administered 2016-01-05: 10 mL via INTRADERMAL

## 2016-01-05 MED ORDER — CLOPIDOGREL BISULFATE 75 MG PO TABS
75.0000 mg | ORAL_TABLET | Freq: Every day | ORAL | Status: DC
  Administered 2016-01-05 – 2016-01-06 (×2): 75 mg via ORAL
  Filled 2016-01-05 (×2): qty 1

## 2016-01-05 MED ORDER — LIDOCAINE-EPINEPHRINE (PF) 1 %-1:200000 IJ SOLN
INTRAMUSCULAR | Status: AC
  Filled 2016-01-05: qty 30

## 2016-01-05 MED ORDER — PIPERACILLIN-TAZOBACTAM 3.375 G IVPB
3.3750 g | Freq: Three times a day (TID) | INTRAVENOUS | Status: DC
  Administered 2016-01-05 – 2016-01-06 (×3): 3.375 g via INTRAVENOUS
  Filled 2016-01-05 (×8): qty 50

## 2016-01-05 MED ORDER — HYDRALAZINE HCL 20 MG/ML IJ SOLN
INTRAMUSCULAR | Status: DC | PRN
  Administered 2016-01-05: 20 mg via INTRAVENOUS

## 2016-01-05 MED ORDER — SODIUM BICARBONATE BOLUS VIA INFUSION
INTRAVENOUS | Status: AC
  Administered 2016-01-05: 15:00:00 via INTRAVENOUS
  Filled 2016-01-05: qty 1

## 2016-01-05 MED ORDER — CHLORHEXIDINE GLUCONATE CLOTH 2 % EX PADS: 6.0000 | MEDICATED_PAD | Freq: Once | CUTANEOUS | Status: DC

## 2016-01-05 MED ORDER — FENTANYL CITRATE (PF) 100 MCG/2ML IJ SOLN
INTRAMUSCULAR | Status: DC | PRN
  Administered 2016-01-05 (×3): 50 ug via INTRAVENOUS

## 2016-01-05 MED ORDER — IOPAMIDOL (ISOVUE-300) INJECTION 61%
INTRAVENOUS | Status: DC | PRN
  Administered 2016-01-05: 75 mL via INTRA_ARTERIAL

## 2016-01-05 MED ORDER — DIPHENHYDRAMINE HCL 50 MG/ML IJ SOLN
INTRAMUSCULAR | Status: DC | PRN
  Administered 2016-01-05: 50 mg via INTRAVENOUS

## 2016-01-05 MED ORDER — SODIUM BICARBONATE 8.4 % IV SOLN
INTRAVENOUS | Status: AC
  Administered 2016-01-05: 14:00:00 via INTRAVENOUS
  Filled 2016-01-05: qty 1000

## 2016-01-05 MED ORDER — HYDRALAZINE HCL 20 MG/ML IJ SOLN
INTRAMUSCULAR | Status: AC
  Filled 2016-01-05: qty 1

## 2016-01-05 MED ORDER — DIPHENHYDRAMINE HCL 50 MG/ML IJ SOLN
INTRAMUSCULAR | Status: AC
  Filled 2016-01-05: qty 1

## 2016-01-05 MED ORDER — HEPARIN SODIUM (PORCINE) 1000 UNIT/ML IJ SOLN
INTRAMUSCULAR | Status: AC
  Filled 2016-01-05: qty 1

## 2016-01-05 MED ORDER — INSULIN GLARGINE 100 UNIT/ML ~~LOC~~ SOLN
15.0000 [IU] | Freq: Every day | SUBCUTANEOUS | Status: DC
  Administered 2016-01-05: 15 [IU] via SUBCUTANEOUS
  Filled 2016-01-05 (×2): qty 0.15

## 2016-01-05 MED ORDER — MIDAZOLAM HCL 2 MG/2ML IJ SOLN
INTRAMUSCULAR | Status: DC | PRN
  Administered 2016-01-05: 1 mg via INTRAVENOUS
  Administered 2016-01-05: 2 mg via INTRAVENOUS
  Administered 2016-01-05: 1 mg via INTRAVENOUS

## 2016-01-05 MED ORDER — HEPARIN SODIUM (PORCINE) 1000 UNIT/ML IJ SOLN
INTRAMUSCULAR | Status: DC | PRN
  Administered 2016-01-05: 4000 [IU] via INTRAVENOUS

## 2016-01-05 SURGICAL SUPPLY — 24 items
BALLN LUTONIX DCB 4X100X130 (BALLOONS) ×4
BALLN LUTONIX DCB 6X100X130 (BALLOONS) ×4
BALLN ULTRVRSE 3X220X150 (BALLOONS) ×4
BALLN ULTRVRSE 5X220X150 (BALLOONS) ×4
BALLOON LUTONIX DCB 4X100X130 (BALLOONS) ×2 IMPLANT
BALLOON LUTONIX DCB 6X100X130 (BALLOONS) ×2 IMPLANT
BALLOON ULTRVRSE 3X220X150 (BALLOONS) ×2 IMPLANT
BALLOON ULTRVRSE 5X220X150 (BALLOONS) ×2 IMPLANT
CATH CXI SUPP ANG 4FR 135 (MICROCATHETER) ×2 IMPLANT
CATH CXI SUPP ANG 4FR 135CM (MICROCATHETER) ×4
CATH KA2 5FR 65CM (CATHETERS) ×4 IMPLANT
CATH PIG 70CM (CATHETERS) ×4 IMPLANT
CATH VERT 100CM (CATHETERS) ×4 IMPLANT
DEVICE PRESTO INFLATION (MISCELLANEOUS) ×4 IMPLANT
DEVICE STARCLOSE SE CLOSURE (Vascular Products) ×4 IMPLANT
GLIDEWIRE ADV .035X260CM (WIRE) ×4 IMPLANT
PACK ANGIOGRAPHY (CUSTOM PROCEDURE TRAY) ×4 IMPLANT
SHEATH ANL2 6FRX45 HC (SHEATH) ×4 IMPLANT
SHEATH BRITE TIP 5FRX11 (SHEATH) ×4 IMPLANT
STENT TIGRIS 6X100X120 (Permanent Stent) ×4 IMPLANT
SYR MEDRAD MARK V 150ML (SYRINGE) ×4 IMPLANT
TUBING CONTRAST HIGH PRESS 72 (TUBING) ×4 IMPLANT
WIRE G V18X300CM (WIRE) ×4 IMPLANT
WIRE J 3MM .035X145CM (WIRE) ×4 IMPLANT

## 2016-01-05 NOTE — Progress Notes (Signed)
Patient is lethargic, arousable to voice. Administered dilaudid x 1 for pain with improvement. Not able to doppler pulses in right foot, also ashen/cyanotic, MD notified. BICARB to infuse until 2200, patient to lay flat until 1750, RN notified. Report called to [facility]. Will transport shortly.

## 2016-01-05 NOTE — Op Note (Signed)
**Note De-Mcguire via Obfuscation** Beth Mcguire Percutaneous Study/Intervention Procedural Note   Date of Surgery: 01/05/2016  Surgeon(s):DEW,JASON   Assistants:none  Pre-operative Diagnosis: PAD with ulceration and gangrenous changes right foot  Post-operative diagnosis: Same  Procedure(s) Performed: 1. Ultrasound guidance for vascular access left femoral artery 2. Catheter placement into right peroneal artery from left femoral approach 3. Aortogram and selective right lower extremity angiogram 4. Percutaneous transluminal angioplasty of Beth entire superficial femoral artery and popliteal artery with 5 mm diameter angioplasty balloon as well as a 6 mm diameter Lutonix drug-coated angioplasty balloon in Beth proximal SFA 5. Percutaneous transluminal angioplasty of Beth tibioperoneal trunk and distal popliteal artery with 4 mm diameter Lutonix drug-coated angioplasty balloon percutaneous transluminal angioplasty of Beth peroneal artery with 3 mm diameter angioplasty balloon  6.  Tigris stent placement to Beth mid superficial femoral artery for greater than 50% residual stenosis after angioplasty 7. StarClose closure device left femoral artery  EBL: 25 cc  Contrast: 75 cc  Fluro Time: 7.7 minutes  Moderate Conscious Sedation Time: approximately 60 minutes using 4 mg of Versed and 100 mcg of Fentanyl  Indications: Beth is a 75 Mcguire with gangrenous changes and ulceration of Beth right foot and nonpalpable pedal pulses. Beth Mcguire is brought in for angiography for further evaluation and potential treatment. Risks and benefits are discussed and informed consent is obtained  Procedure: Beth Mcguire and appropriate procedural time out was performed. Beth Mcguire supine on Beth table and prepped and draped in Beth usual sterile fashion.Moderate conscious sedation was  administered during a face to face encounter with Beth Mcguire throughout Beth procedure with my supervision of the RN administering medicines and monitoring Beth Mcguire's vital signs, pulse oximetry, telemetry and mental status throughout from Beth start of Beth procedure until Beth Mcguire was taken to Beth recovery room. Ultrasound was used to evaluate Beth left common femoral artery. It was patent . A digital ultrasound image was acquired. A Seldinger needle was used to access Beth left common femoral artery under direct ultrasound guidance and a permanent image was performed. A 0.035 J wire was advanced without resistance and a 5Fr sheath was Mcguire. Pigtail catheter was Mcguire into Beth aorta and an AP aortogram was performed. This demonstrated normal renal arteries and normal aorta and iliac segments without significant stenosis. I then crossed Beth aortic bifurcation and advanced to Beth right femoral head. Selective right lower extremity angiogram was then performed. This demonstrated moderate to high-grade mid profunda femoris artery stenosis, flush occlusion of Beth SFA with reconstitution of Beth popliteal artery just above Beth knee. There is then one-vessel runoff with a diseased peroneal artery with high-grade stenosis in Beth proximal to mid segment and significant stenosis in Beth tibioperoneal trunk as well.. Beth Mcguire was systemically heparinized and a 6 [place] [employer] sheath was then Mcguire over Beth [employer] wire. I then used a Kumpe catheter and Beth advantage wire to get into Beth SFA and navigate through Beth occlusion confirming intraluminal flow in Beth popliteal artery and then advancing into Beth mid peroneal artery distally. I then decided to proceed with treatment. I exchanged for a 0.018 wire. Beth peroneal artery was treated with a 3 mm diameter by 22 cm length angioplasty balloon from Beth mid to distal peroneal artery up to Beth tibioperoneal trunk. This was inflated to 8 atm for 1 minute. A 4  mm diameter at 10 cm length Lutonix drug-coated angioplasty balloon was used to treat Beth tibioperoneal trunk.  This was inflated to 10 atm for 1 minute. I then used a 5 mm diameter by 22 cm length angioplasty balloon to treat from Beth below-knee popliteal artery up through Beth origin of Beth superficial femoral artery. 3 inflations with this balloon were required to treat Beth entire area. Each inflation was from 8-12 atm and held for 1 minute. Completion angiogram following this showed residual moderate stenosis at Beth origin of Beth superficial femoral artery and one area of residual high-grade near occlusive stenosis in Beth mid superficial femoral artery. Beth tibioperoneal trunk and peroneal artery looked good with no greater than 30% residual stenosis. I treated Beth mid superficial femoral artery stenosis with a 6 mm diameter by 10 cm length Tigris stent. I treated Beth origin of Beth superficial femoral artery stenosis with a 6 mm diameter by 10 cm length Lutonix drug-coated angioplasty balloon, and then postdilated Beth stent with this balloon as well. Completion angiogram showed about a 25% residual stenosis at Beth SFA origin, no residual stenosis within Beth SFA stent, and no greater than 20% residual stenosis throughout Beth other regions of Beth SFA and popliteal arteries. Beth Mcguire flow had been markedly improved. I elected to terminate Beth procedure. Beth sheath was removed and StarClose closure device was deployed in Beth left femoral artery with excellent hemostatic result. Beth Mcguire having tolerated Beth procedure well.  Findings:  Aortogram: 2 right renal arteries and one left renal artery all patent without stenosis. Aorta and iliac arteries widely patent without significant stenosis. Right Lower Extremity: Moderate to high-grade mid profunda femoris artery stenosis, flush occlusion of Beth SFA with reconstitution of Beth popliteal  artery just above Beth knee. There is then one-vessel runoff with a diseased peroneal artery with high-grade stenosis in Beth proximal to mid segment and significant stenosis in Beth tibioperoneal trunk as well.   Disposition: Beth was taken to Beth recovery room in stable Mcguire having tolerated Beth procedure well.  Complications: None  DEW,JASON 01/05/2016 4:02 PM

## 2016-01-05 NOTE — Progress Notes (Addendum)
Inpatient Diabetes Program Recommendations  AACE/ADA: New Consensus Statement on Inpatient Glycemic Control (2015)  Target Ranges:  Prepandial:   less than 140 mg/dL      Peak postprandial:   less than 180 mg/dL (1-2 hours)      Critically ill patients:  140 - 180 mg/dL   Review of Glycemic Control  Results for MIREILY, WICHERS (MRN [id number]) as of 01/05/2016 10:32  Ref. Range 01/04/2016 22:16 01/05/2016 07:52  Glucose-Capillary Latest Ref Range: 65-99 mg/dL 220 (H) 272 (H)   Diabetes history: Type 2 Outpatient Diabetes medications: Humalog 72/25 insulin 40 units at hs Current orders for Inpatient glycemic control: Novolog 0-15 units tid  Inpatient Diabetes Program Recommendations:  Please consider adding 15 units of Lantus insulin stating now (home dose of basal is 30 units).  Please order an A1C to determine blood sugars over the past 3 months.  If we are going to keep this patient NPO for an extended period of time- consider changing Novolog to q6h.  Patient reports she's been taking her insulin at hs.  She reports waking through the night sweaty.  She does not take insulin in the am.  Re-educated on the use of Humalog 75/25 and the need to take it BEFORE her evening meal for safety- I have written this down for her. Reviewed site selection for insulin administration and insulin pen technique.  Gentry Fitz, RN, BA, MHA, CDE Diabetes Coordinator Inpatient Diabetes Program  [phone] (Team Pager) [phone] ([place]) 01/05/2016 10:37 AM

## 2016-01-05 NOTE — Progress Notes (Signed)
[facility] [place] NAME: Beth Mcguire    MR#:  [id number]  DATE OF BIRTH:  [birth year]  SUBJECTIVE:  CHIEF COMPLAINT:  No chief complaint on file.  Admitted for right great toe cellulitis failed outpatient therapy. Continues to have on and off pain. Angiogram later today. Afebrile. REVIEW OF SYSTEMS:    Review of Systems  Constitutional: Negative for fever and chills.  HENT: Negative for sore throat.   Eyes: Negative for blurred vision, double vision and pain.  Respiratory: Negative for cough, hemoptysis, shortness of breath and wheezing.   Cardiovascular: Negative for chest pain, palpitations, orthopnea and leg swelling.  Gastrointestinal: Negative for heartburn, nausea, vomiting, abdominal pain, diarrhea and constipation.  Genitourinary: Negative for dysuria and hematuria.  Musculoskeletal: Negative for back pain and joint pain.  Skin: Negative for rash.  Neurological: Negative for sensory change, speech change, focal weakness and headaches.  Endo/Heme/Allergies: Does not bruise/bleed easily.  Psychiatric/Behavioral: Negative for depression. The patient is not nervous/anxious.    Right foot pain DRUG ALLERGIES:  No Known Allergies  VITALS:  Blood pressure 151/43, pulse 66, temperature 99.1 F (37.3 C), temperature source Oral, resp. rate 20, height 5\' 2"  (1.575 m), weight 68.266 kg (150 lb 8 oz), SpO2 96 %.  PHYSICAL EXAMINATION:   Physical Exam  GENERAL:  75 y.o.-year-old patient lying in the bed with no acute distress.  EYES: Pupils equal, round, reactive to light and accommodation. No scleral icterus. Extraocular muscles intact.  HEENT: Head atraumatic, normocephalic. Oropharynx and nasopharynx clear.  NECK:  Supple, no jugular venous distention. No thyroid enlargement, no tenderness.  LUNGS: Normal breath sounds bilaterally, no wheezing, rales, rhonchi. No use of accessory muscles of respiration.  CARDIOVASCULAR:  S1, S2 normal. No murmurs, rubs, or gallops.  ABDOMEN: Soft, nontender, nondistended. Bowel sounds present. No organomegaly or mass.  EXTREMITIES: No cyanosis, clubbing or edema b/l.    NEUROLOGIC: Cranial nerves II through XII are intact. No focal Motor or sensory deficits b/l.   PSYCHIATRIC: The patient is alert and oriented x 3.  SKIN: Right foot dressing. Decreased pulses in lower extremity's. Absent dorsalis pedis right foot  LABORATORY PANEL:   CBC  Recent Labs Lab 01/05/16 0633  WBC 13.5*  HGB 10.0*  HCT 30.1*  PLT 490*   ------------------------------------------------------------------------------------------------------------------ Chemistries   Recent Labs Lab 01/05/16 0633  NA 131*  K 4.1  CL 104  CO2 20*  GLUCOSE 236*  BUN 17  CREATININE 1.05*  CALCIUM 8.0*   ------------------------------------------------------------------------------------------------------------------  Cardiac Enzymes No results for input(s): TROPONINI in the last 168 hours. ------------------------------------------------------------------------------------------------------------------  RADIOLOGY:  No results found.   ASSESSMENT AND PLAN:   # Right great toe cellulitis with possible osteomyelitis Due to peripheral arterial disease Failed outpatient treatment with amoxicillin. On IV Zosyn and vancomycin per Blood cultures pending We'll need to wait for intraoperative cultures. 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  # Diabetes mellitus Start Lantus at lower dose Sliding scale insulin Diabetic diet  # hypertension Continue lisinopril  # Mild asymptomatic hyponatremia On IV fluids  # DVT prophylaxis with Lovenox  All the records are reviewed and case discussed with Care Management/Social Workerr. Management plans discussed with the patient, family and  they are in agreement.  CODE STATUS: FULL CODE  DVT Prophylaxis: SCDs  TOTAL TIME TAKING CARE OF THIS PATIENT: 35 minutes.   POSSIBLE D/C  IN 2-3 DAYS, DEPENDING ON CLINICAL CONDITION.  Hillary Bow R M.D on 01/05/2016 at 12:01 PM  Between 7am to 6pm - Pager - [phone]  After 6pm go to [email or web address] - password [email or web address] [facility]  Office  [phone]  CC: Primary care physician; Marden Noble, MD  Note: This dictation was prepared with Dragon dictation along with smaller phrase technology. Any transcriptional errors that result from this process are unintentional.

## 2016-01-05 NOTE — Consult Note (Signed)
[facility] Consult Note  MRN : [id number]  Beth Mcguire is a 75 y.o. (02/16/1941) female who presents with chief complaint of No chief complaint on file. Marland Kitchen  History of Present Illness: I am asked by Dr. Cleda Mccreedy from podiatry to evaluate the patient's perfusion secondary to gangrenous changes and nonhealing ulcerations on the right foot. She has had skin changes for weeks to months now. Her pain has gradually worsened over weeks to months as well. She has minimal improvement with local wound care and antibiotics, and her foot is cold and painful. This is on the right side. Her left side does not have any open ulcerations or ischemic rest pain. It is difficult to discern whether or not she had claudication prior to this. She has long-standing diabetes mellitus and hypertension as atherosclerotic risk factors.  Current Facility-Administered Medications  Medication Dose Route Frequency Provider Last Rate Last Dose  . 0.9 %  sodium chloride infusion   Intravenous Continuous Epifanio Lesches, MD 50 mL/hr at 01/04/16 1956    . 0.9 %  sodium chloride infusion   Intravenous Continuous Algernon Huxley, MD      . Doug Sou Hold] acetaminophen (TYLENOL) tablet 650 mg  650 mg Oral Q6H PRN Epifanio Lesches, MD       Or  . Doug Sou Hold] acetaminophen (TYLENOL) suppository 650 mg  650 mg Rectal Q6H PRN Epifanio Lesches, MD      . Doug Sou Hold] bisacodyl (DULCOLAX) EC tablet 5 mg  5 mg Oral Daily PRN Epifanio Lesches, MD      . Chlorhexidine Gluconate Cloth 2 % PADS 6 each  6 each Topical Once Algernon Huxley, MD      . Doug Sou Hold] docusate sodium (COLACE) capsule 100 mg  100 mg Oral BID Epifanio Lesches, MD   100 mg at 01/05/16 0853  . [MAR Hold] enoxaparin (LOVENOX) injection 40 mg  40 mg Subcutaneous Q24H Epifanio Lesches, MD   40 mg at 01/04/16 2202  . [MAR Hold] HYDROcodone-acetaminophen (NORCO/VICODIN) 5-325 MG per tablet 1-2 tablet  1-2 tablet Oral Q4H PRN Epifanio Lesches, MD      . Doug Sou Hold] insulin aspart (novoLOG) injection 0-15 Units  0-15 Units Subcutaneous TID WC Epifanio Lesches, MD   3 Units at 01/05/16 1153  . [MAR Hold] insulin glargine (LANTUS) injection 15 Units  15 Units Subcutaneous QHS Hillary Bow, MD      . Doug Sou Hold] ondansetron [facility]) tablet 4 mg  4 mg Oral Q6H PRN Epifanio Lesches, MD       Or  . Doug Sou Hold] ondansetron (ZOFRAN) injection 4 mg  4 mg Intravenous Q6H PRN Epifanio Lesches, MD      . Doug Sou Hold] piperacillin-tazobactam (ZOSYN) IVPB 3.375 g  3.375 g Intravenous 3 times per day Epifanio Lesches, MD   3.375 g at 01/05/16 0854  . [MAR Hold] vancomycin (VANCOCIN) IVPB 1000 mg/200 mL premix  1,000 mg Intravenous Q24H Epifanio Lesches, MD   1,000 mg at 01/05/16 [id number]    Past Medical History  Diagnosis Date  . Diabetes ([place])   . Hypertension   . Colon cancer [facility])     Past Surgical History  Procedure Laterality Date  . Colon surgery      Social History Social History  Substance Use Topics  . Smoking status: Never Smoker   . Smokeless tobacco: None  . Alcohol Use: None  No IV drug use  Family History Family History  Problem Relation Age of  Onset  . Diabetes    No bleeding disorders, clotting disorders, autoimmune diseases, or aneurysms known  No Known Allergies   REVIEW OF SYSTEMS (Negative unless checked)  Constitutional: [] Weight loss  [] Fever  [] Chills Cardiac: [] Chest pain   [] Chest pressure   [] Palpitations   [] Shortness of breath when laying flat   [] Shortness of breath at rest   [] Shortness of breath with exertion. Vascular:  [] Pain in legs with walking   [] Pain in legs at rest   [] Pain in legs when laying flat   [] Claudication   [] Pain in feet when walking  [x] Pain in feet at rest  [] Pain in feet when laying flat   [] History of DVT   [] Phlebitis   [] Swelling in legs   [] Varicose veins   [x] Non-healing ulcers Pulmonary:   [] Uses home oxygen   [] Productive cough   [] Hemoptysis    [] Wheeze  [] COPD   [] Asthma Neurologic:  [] Dizziness  [] Blackouts   [] Seizures   [] History of stroke   [] History of TIA  [] Aphasia   [] Temporary blindness   [] Dysphagia   [] Weakness or numbness in arms   [] Weakness or numbness in legs Musculoskeletal:  [] Arthritis   [] Joint swelling   [] Joint pain   [] Low back pain Hematologic:  [] Easy bruising  [] Easy bleeding   [] Hypercoagulable state   [] Anemic  [] Hepatitis Gastrointestinal:  [] Blood in stool   [] Vomiting blood  [] Gastroesophageal reflux/heartburn   [] Difficulty swallowing. Genitourinary:  [x] Chronic kidney disease   [] Difficult urination  [] Frequent urination  [] Burning with urination   [] Blood in urine Skin:  [] Rashes   [x] Ulcers   [x] Wounds Psychological:  [] History of anxiety   []  History of major depression.  Physical Examination  Filed Vitals:   01/04/16 2104 01/05/16 0436 01/05/16 0500 01/05/16 1335  BP: 153/49 151/43  154/53  Pulse: 71 66  64  Temp: 99.9 F (37.7 C) 99.1 F (37.3 C)  98.8 F (37.1 C)  TempSrc: Oral Oral  Oral  Resp: 18 20  17   Height:      Weight:   68.266 kg (150 lb 8 oz)   SpO2: 97% 96%  98%   Body mass index is 27.52 kg/(m^2). Gen:  WD/WN, NAD Head: [place]/AT, No temporalis wasting. Prominent temp pulse not noted. Ear/Nose/Throat: Hearing grossly intact, nares w/o erythema or drainage, oropharynx w/o Erythema/Exudate Eyes: PERRLA, EOMI.  Neck: Supple, no nuchal rigidity.  No JVD.  Pulmonary:  Good air movement, Equal bilaterally.  Cardiac: RRR, normal S1, S2 Vascular:  Vessel Right Left  Radial Palpable Palpable  Ulnar Palpable Palpable  Brachial Palpable Palpable  Carotid Palpable, without bruit Palpable, without bruit  Aorta Not palpable N/A  Femoral Palpable Palpable  Popliteal Not Palpable Not Palpable  PT Not Palpable Palpable  DP Not Palpable Not Palpable   Gastrointestinal: soft, non-tender/non-distended. No guarding/reflex. No masses, surgical incisions, or scars. Musculoskeletal: M/S  5/5 throughout.  Right lower extremity with ischemic changes.  No deformity or atrophy. Mild lower extremity edema bilaterally Neurologic: CN 2-12 intact. Pain and light touch intact in extremities.  Symmetrical.  Speech is fluent. Motor exam as listed above. Psychiatric: Judgment intact, Mood & affect appropriate for pt's clinical situation. Dermatologic: Gangrenous changes to right great toe. Ulceration on the feet with mild surrounding erythema. Right foot is cool with poor capillary refill. Left foot is warm with good capillary refill Lymph : No Cervical, Axillary, or Inguinal lymphadenopathy.   CBC Lab Results  Component Value Date   WBC 13.5* 01/05/2016  HGB 10.0* 01/05/2016   HCT 30.1* 01/05/2016   MCV 80.7 01/05/2016   PLT 490* 01/05/2016    BMET    Component Value Date/Time   NA 131* 01/05/2016 0633   NA 142 11/13/2011 1110   K 4.1 01/05/2016 0633   K 4.6 11/13/2011 1110   CL 104 01/05/2016 0633   CL 106 11/13/2011 1110   CO2 20* 01/05/2016 0633   CO2 28 11/13/2011 1110   GLUCOSE 236* 01/05/2016 0633   GLUCOSE 123* 11/13/2011 1110   BUN 17 01/05/2016 0633   BUN 12 11/13/2011 1110   CREATININE 1.05* 01/05/2016 0633   CREATININE 1.10 12/04/2013 0953   CALCIUM 8.0* 01/05/2016 0633   CALCIUM 8.7 11/13/2011 1110   GFRNONAA 50* 01/05/2016 0633   GFRNONAA 49* 12/04/2013 0953   GFRNONAA >60 11/13/2011 1110   GFRAA 58* 01/05/2016 0633   GFRAA 57* 12/04/2013 0953   GFRAA >60 11/13/2011 1110   Estimated Creatinine Clearance: 40.7 mL/min (by C-G formula based on Cr of 1.05).  COAG No results found for: INR, PROTIME  Radiology No results found.    Assessment/Plan 1. Gangrene/ulceration of the right foot. Clearly a critically limb threatening situation. Discussed with the patient that revascularization is indicated to try to improve her perfusion to attempt wound healing and limb salvage. Without optimize perfusion, limb loss would be expected. Given the situation,  she will proceed for angiography today. Risks and benefits were discussed. 2. PAD with ulceration/gangrene. Plan as above. 3. Mild chronic kidney disease with estimated glomerular filtration rate of approximately 50. Continue hydration to avoid nephrotoxicity. 4. Diabetes mellitus. Optimize glucose control important for wound healing as well as avoidance of progression of atherosclerotic peripheral arterial disease.   Helayne Metsker, MD  01/05/2016 3:55 PM

## 2016-01-06 LAB — GLUCOSE, CAPILLARY
GLUCOSE-CAPILLARY: 152 mg/dL — AB (ref 65–99)
GLUCOSE-CAPILLARY: 190 mg/dL — AB (ref 65–99)
GLUCOSE-CAPILLARY: 173 mg/dL — AB (ref 65–99)

## 2016-01-06 MED ORDER — FREESTYLE SYSTEM KIT: 1.0000 | PACK | Freq: Three times a day (TID) | Status: DC

## 2016-01-06 MED ORDER — CLOPIDOGREL BISULFATE 75 MG PO TABS: 75.0000 mg | ORAL_TABLET | Freq: Every day | ORAL | Status: DC

## 2016-01-06 MED ORDER — ASPIRIN 81 MG PO TABS: 81.0000 mg | ORAL_TABLET | Freq: Every day | ORAL | Status: DC

## 2016-01-06 MED ORDER — AMOXICILLIN-POT CLAVULANATE 875-125 MG PO TABS: 1.0000 | ORAL_TABLET | Freq: Two times a day (BID) | ORAL | Status: DC

## 2016-01-06 NOTE — Discharge Instructions (Signed)
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
## 2016-01-06 NOTE — Progress Notes (Signed)
Inpatient Diabetes Program Recommendations  AACE/ADA: New Consensus Statement on Inpatient Glycemic Control (2015)  Target Ranges:  Prepandial:   less than 140 mg/dL      Peak postprandial:   less than 180 mg/dL (1-2 hours)      Critically ill patients:  140 - 180 mg/dL   Review of Glycemic Control  Results for Beth Mcguire, Beth Mcguire (MRN [id number]) as of 01/06/2016 08:58  Ref. Range 01/05/2016 07:52 01/05/2016 11:20 01/05/2016 17:14 01/05/2016 21:07 01/06/2016 07:39  Glucose-Capillary Latest Ref Range: 65-99 mg/dL 272 (H) 151 (H) 224 (H) 227 (H) 152 (H)    Diabetes history: Type 2, A1C 9.5% Outpatient Diabetes medications: Humalog 72/25 insulin 40 units at hs Current orders for Inpatient glycemic control: Novolog 0-15 units tid, Lantus 15 units qhs  Inpatient Diabetes Program Recommendations: Improved fasting blood sugar after starting Lantus 15 units last night.  (home dose of basal is 30 units).  Consider starting Novolog 3 units tid with meals (hold if she eats less than 50%)   Gentry Fitz, RN, [place], [place], CDE Diabetes Coordinator Inpatient Diabetes Program  [phone] (Team Pager) [phone] ([place]) 01/06/2016 9:01 AM

## 2016-01-06 NOTE — Progress Notes (Signed)
Pharmacy called this writer to administer missed dose from 0630.

## 2016-01-06 NOTE — Care Management (Signed)
Glucometer Rx placed with other meds on patient's chart for RN. No further RNCM needs.

## 2016-01-06 NOTE — Progress Notes (Signed)
1 Day Post-Op  Subjective: Patient seen. Does not complain of any pain this morning.  Objective: Vital signs in last 24 hours: Temp:  [97.8 F (36.6 C)-99.3 F (37.4 C)] 98.7 F (37.1 C) (03/31 0740) Pulse Rate:  [64-79] 69 (03/31 0742) Resp:  [17-21] 18 (03/31 0346) BP: (137-167)/(34-55) 150/35 mmHg (03/31 0742) SpO2:  [93 %-99 %] 98 % (03/31 0740) Weight:  [74.254 kg (163 lb 11.2 oz)] 74.254 kg (163 lb 11.2 oz) (03/31 0532) Last BM Date: 01/03/16  Intake/Output from previous day: 03/30 0701 - 03/31 0700 In: 1214.2 [I.V.:1214.2] Out: -  Intake/Output this shift: Total I/O In: 120 [P.O.:120] Out: -   Dry eschar with some necrotic tissue over the medial aspect of the first metatarsal head. Some mild progression of the cyanosis and bluish discoloration in the right hallux. Erythema has improved and is mostly around the first metatarsophalangeal joint. Maybe some very slight discoloration change in the second toe but capillary refill was still intact.  Lab Results:   Recent Labs  01/04/16 1804 01/05/16 0633  WBC 17.6* 13.5*  HGB 11.3* 10.0*  HCT 34.4* 30.1*  PLT 547* 490*   BMET  Recent Labs  01/04/16 1804 01/05/16 0633  NA 132* 131*  K 3.7 4.1  CL 102 104  CO2 22 20*  GLUCOSE 153* 236*  BUN 17 17  CREATININE 1.10* 1.05*  CALCIUM 8.6* 8.0*   PT/INR No results for input(s): LABPROT, INR in the last 72 hours. ABG No results for input(s): PHART, HCO3 in the last 72 hours.  Invalid input(s): PCO2, PO2  Studies/Results: No results found.  Anti-infectives: Anti-infectives    Start     Dose/Rate Route Frequency Ordered Stop   01/05/16 0630  vancomycin (VANCOCIN) IVPB 1000 mg/200 mL premix     1,000 mg 200 mL/hr over 60 Minutes Intravenous Every 24 hours 01/04/16 2151     01/05/16 0600  piperacillin-tazobactam (ZOSYN) IVPB 3.375 g     3.375 g 12.5 mL/hr over 240 Minutes Intravenous 3 times per day 01/05/16 0110     01/04/16 1815  vancomycin (VANCOCIN)  IVPB 1000 mg/200 mL premix     1,000 mg 200 mL/hr over 60 Minutes Intravenous STAT 01/04/16 1804 01/04/16 2129   01/04/16 1815  piperacillin-tazobactam (ZOSYN) IVPB 3.375 g     3.375 g 12.5 mL/hr over 240 Minutes Intravenous  Once 01/04/16 1806 01/05/16 0200   01/04/16 1800  vancomycin (VANCOCIN) IVPB 1000 mg/200 mL premix  Status:  Discontinued     1,000 mg 200 mL/hr over 60 Minutes Intravenous Every 24 hours 01/04/16 1756 01/04/16 1802      Assessment/Plan: s/p Procedure(s): Lower Extremity Angiography (N/A) Lower Extremity Intervention Assessment: Gangrene with improving cellulitis right first metatarsophalangeal joint. Peripheral vascular disease. Diabetes with associated neuropathy.   Plan: A wet-to-dry dressing was applied to the area on the right foot. Discussed with the patient that she will need amputation of the great toe with partial removal of her forefoot bone at some point. Decision will need to be made as to whether to let it demarcate additionally go ahead and perform this weekend. Spoke with Dr. Lucky Cowboy this morning and the patient only has peroneal vessel runoff into the foot. I will speak with Dr. Vickki Muff this morning about the patient as he will be covering over the weekend.  LOS: 2 days    Durward Fortes 01/06/2016

## 2016-01-06 NOTE — Progress Notes (Signed)
[facility]  Daily Progress Note   Subjective  - 1 Day Post-Op  Did well overnight without major events.  Objective Filed Vitals:   01/06/16 0346 01/06/16 0532 01/06/16 0740 01/06/16 0742  BP: 139/55  156/34 150/35  Pulse: 74  71 69  Temp: 98.7 F (37.1 C)  98.7 F (37.1 C)   TempSrc: Oral  Oral   Resp: 18     Height:      Weight:  74.254 kg (163 lb 11.2 oz)    SpO2: 93%  98%     Intake/Output Summary (Last 24 hours) at 01/06/16 0928 Last data filed at 01/06/16 0723  Gross per 24 hour  Intake 1334.17 ml  Output      0 ml  Net 1334.17 ml    PULM  CTAB CV  RRR VASC  Incision clean dry and intact from access. Right foot warm with gangrenous changes to the right great toe.  Laboratory CBC    Component Value Date/Time   WBC 13.5* 01/05/2016 0633   WBC 9.2 12/04/2013 0953   HGB 10.0* 01/05/2016 0633   HGB 13.2 12/04/2013 0953   HCT 30.1* 01/05/2016 0633   HCT 41.0 12/04/2013 0953   PLT 490* 01/05/2016 0633   PLT 324 12/04/2013 0953    BMET    Component Value Date/Time   NA 131* 01/05/2016 0633   NA 142 11/13/2011 1110   K 4.1 01/05/2016 0633   K 4.6 11/13/2011 1110   CL 104 01/05/2016 0633   CL 106 11/13/2011 1110   CO2 20* 01/05/2016 0633   CO2 28 11/13/2011 1110   GLUCOSE 236* 01/05/2016 0633   GLUCOSE 123* 11/13/2011 1110   BUN 17 01/05/2016 0633   BUN 12 11/13/2011 1110   CREATININE 1.05* 01/05/2016 0633   CREATININE 1.10 12/04/2013 0953   CALCIUM 8.0* 01/05/2016 0633   CALCIUM 8.7 11/13/2011 1110   GFRNONAA 50* 01/05/2016 0633   GFRNONAA 49* 12/04/2013 0953   GFRNONAA >60 11/13/2011 1110   GFRAA 58* 01/05/2016 0633   GFRAA 57* 12/04/2013 0953   GFRAA >60 11/13/2011 1110    Assessment/Planning: POD #1 s/p extensive RLE revascularization   Doing well. Access site clean and dry and intact. Perfusion seems improved.  Okay to proceed with any podiatry surgery as needed. Follow-up in our office in 3 weeks with  ABIs    Beth Mcguire  01/06/2016, 9:28 AM

## 2016-01-06 NOTE — Progress Notes (Signed)
Discharge note: Reviewed discharge information and summary, with verbalization of understanding. Escorted by NT

## 2016-01-06 NOTE — Care Management Important Message (Signed)
Important Message  Patient Details  Name: Beth Mcguire MRN: [id number] Date of Birth: [birth year]   Medicare Important Message Given:  Yes    Marshell Garfinkel, RN 01/06/2016, 8:19 AM

## 2016-01-09 ENCOUNTER — Encounter: Payer: Self-pay | Admitting: Vascular Surgery

## 2016-01-09 NOTE — Discharge Summary (Signed)
[facility] [place] NAME: Beth Mcguire    MR#:  [id number]  DATE OF BIRTH:  September 29, 1941  DATE OF ADMISSION:  01/04/2016 ADMITTING PHYSICIAN: Epifanio Lesches, MD  DATE OF DISCHARGE: 01/06/2016  7:08 PM  PRIMARY CARE PHYSICIAN: Marden Noble, MD   ADMISSION DIAGNOSIS:  cellulitis right foot pad with ulceration  DISCHARGE DIAGNOSIS:  Active Problems:   Toe ulcer due to DM [facility])   SECONDARY DIAGNOSIS:   Past Medical History  Diagnosis Date  . Diabetes ([place])   . Hypertension   . Colon cancer [facility])      ADMITTING HISTORY  Beth Mcguire is a 75 y.o. female with a known history of DMII, htn sent in from her at the office because of worsening great toe infection on the right great toe. Patient is referred for direct admission because of possible left chronic infection of the right great toe with poor Circulation.the patient says it started Like a a blister on the right great toe with a worsening redness, swelling, pain ,her primary doctor prescribed amoxicillin. ML Dr. patient's and has 1 more day to take but because of black discoloration associated with worsening pain she was sent to Podiatrist. No fever.  HOSPITAL COURSE:   # Right great toe cellulitis with gangrene Nonhealing Due to peripheral arterial disease Failed outpatient treatment On IV Zosyn and vancomycin during hospitalization Blood cultures negative Discussed with podiatry Dr. Cleda Mccreedy on day of discharge and suggested outpatient oral antibiotics and close follow-up in the office. May need amputation in the future if gangrene worsens.  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  CONSULTS OBTAINED:  Treatment Team:  Algernon Huxley, MD  DRUG ALLERGIES:  No Known Allergies  DISCHARGE MEDICATIONS:   Discharge Medication List as of 01/06/2016  6:04 PM    START taking these medications   Details  amoxicillin-clavulanate (AUGMENTIN) 875-125 MG tablet Take 1 tablet by mouth 2 (two) times daily., Starting 01/06/2016, Until Discontinued, Print    aspirin 81 MG tablet Take 1 tablet (81 mg total) by mouth daily., Starting 01/06/2016, Until Discontinued, No Print    clopidogrel (PLAVIX) 75 MG tablet Take 1 tablet (75 mg total) by mouth daily., Starting 01/06/2016, Until Discontinued, Print    glucose monitoring kit (FREESTYLE) monitoring kit 1 each by Does not apply route 4 (four) times daily -  before meals and at bedtime., Starting 01/06/2016, Until Discontinued, Print      CONTINUE these medications which have NOT CHANGED   Details  insulin lispro protamine-lispro (HUMALOG 75/25 MIX) (75-25) 100 UNIT/ML SUSP injection Inject 40 Units into the skin., Until Discontinued, Historical Med    lisinopril (PRINIVIL,ZESTRIL) 20 MG tablet Take 20 mg by mouth daily., Until Discontinued, Historical Med    simvastatin (ZOCOR) 20 MG tablet Take 20 mg by mouth daily., Until Discontinued, Historical Med       Today   VITAL SIGNS:  Blood pressure 169/48, pulse 72, temperature 99.1 F (37.3 C), temperature source Oral, resp. rate 18, height '5\' 2"'  (1.575 m), weight 74.254 kg (163 lb 11.2 oz), SpO2 97 %.  I/O:  No intake or output data in the 24 hours ending 01/09/16 1330  PHYSICAL EXAMINATION:  Physical Exam  GENERAL:  75 y.o.-year-old patient lying in the bed with no acute distress.  LUNGS: Normal breath sounds bilaterally, no wheezing, rales,rhonchi or crepitation. No use of accessory muscles of respiration.  CARDIOVASCULAR: S1, S2 normal. No murmurs, rubs, or gallops.  ABDOMEN: Soft, non-tender, non-distended. Bowel sounds present. No organomegaly or mass.   NEUROLOGIC: Moves all 4 extremities. PSYCHIATRIC: The patient is alert and awake SKIN: Right foot cellulitis and toe gangrene.  DATA REVIEW:   CBC  Recent Labs Lab 01/05/16 0633  WBC 13.5*  HGB 10.0*  HCT 30.1*  PLT 490*   Chemistries   Recent Labs Lab 01/05/16 0633  NA 131*  K 4.1  CL 104  CO2 20*  GLUCOSE 236*  BUN 17  CREATININE 1.05*  CALCIUM 8.0*    Cardiac Enzymes No results for input(s): TROPONINI in the last 168 hours.  Microbiology Results  Results for orders placed or performed during the hospital encounter of 01/04/16  CULTURE, BLOOD (ROUTINE X 2) w Reflex to PCR ID Panel     Status: None (Preliminary result)   Collection Time: 01/04/16  7:07 PM  Result Value Ref Range Status   Specimen Description BLOOD RIGHT WRIST  Final   Special Requests BOTTLES DRAWN AEROBIC AND ANAEROBIC  1CC  Final   Culture NO GROWTH 4 DAYS  Final   Report Status PENDING  Incomplete  CULTURE, BLOOD (ROUTINE X 2) w Reflex to PCR ID Panel     Status: None (Preliminary result)   Collection Time: 01/04/16  8:25 PM  Result Value Ref Range Status   Specimen Description BLOOD RIGHT ANTECUBITAL  Final   Special Requests BOTTLES DRAWN AEROBIC AND ANAEROBIC 10ML  Final   Culture NO GROWTH 4 DAYS  Final   Report Status PENDING  Incomplete  Surgical PCR screen     Status: None   Collection Time: 01/04/16 10:04 PM  Result Value Ref Range Status   MRSA, PCR NEGATIVE NEGATIVE Final   Staphylococcus aureus NEGATIVE NEGATIVE Final    Comment:        The Xpert SA Assay (FDA approved for NASAL specimens in patients over 69 years of age), is one component of a comprehensive surveillance program.  Test performance has been validated by [facility] for patients greater than or equal to 7 year old. It is not intended to diagnose infection nor to guide or monitor treatment.     RADIOLOGY:  No results found.  Follow up with PCP in 1 week.  Management plans discussed with the  patient, family and they are in agreement.  CODE STATUS:  Code Status History    Date Active Date Inactive Code Status Order ID Comments User Context   01/04/2016  5:56 PM 01/06/2016 10:08 PM Full Code [id number]  Epifanio Lesches, MD Inpatient     TOTAL TIME TAKING CARE OF THIS PATIENT ON DAY OF DISCHARGE: more than 30 minutes.   Hillary Bow R M.D on 01/09/2016 at 1:30 PM  Between 7am to 6pm - Pager - [phone]  After 6pm go to [email or web address] - password [email or web address] [facility] [facility]  Office  [phone]  CC: Primary care physician; Marden Noble, MD  Note: This dictation was prepared with Dragon dictation along with smaller phrase technology. Any transcriptional errors that result from this process are unintentional.

## 2016-01-10 LAB — CULTURE, BLOOD (ROUTINE X 2)
CULTURE: NO GROWTH
Culture: NO GROWTH

## 2016-01-12 ENCOUNTER — Inpatient Hospital Stay
Admission: AD | Admit: 2016-01-12 | Discharge: 2016-01-16 | DRG: 256 | Disposition: A | Discharge status: Skilled Nursing Facility | Payer: Medicare Other | Source: Ambulatory Visit | Attending: Internal Medicine | Admitting: Internal Medicine

## 2016-01-12 ENCOUNTER — Encounter: Payer: Self-pay | Admitting: Internal Medicine

## 2016-01-12 DIAGNOSIS — Z833 Family history of diabetes mellitus: Secondary | ICD-10-CM | POA: Diagnosis not present

## 2016-01-12 DIAGNOSIS — Z85038 Personal history of other malignant neoplasm of large intestine: Secondary | ICD-10-CM

## 2016-01-12 DIAGNOSIS — L97519 Non-pressure chronic ulcer of other part of right foot with unspecified severity: Secondary | ICD-10-CM | POA: Diagnosis present

## 2016-01-12 DIAGNOSIS — F419 Anxiety disorder, unspecified: Secondary | ICD-10-CM | POA: Diagnosis present

## 2016-01-12 DIAGNOSIS — Z794 Long term (current) use of insulin: Secondary | ICD-10-CM

## 2016-01-12 DIAGNOSIS — E11621 Type 2 diabetes mellitus with foot ulcer: Secondary | ICD-10-CM | POA: Diagnosis present

## 2016-01-12 DIAGNOSIS — E1169 Type 2 diabetes mellitus with other specified complication: Secondary | ICD-10-CM | POA: Diagnosis present

## 2016-01-12 DIAGNOSIS — Z9862 Peripheral vascular angioplasty status: Secondary | ICD-10-CM | POA: Diagnosis not present

## 2016-01-12 DIAGNOSIS — Z79899 Other long term (current) drug therapy: Secondary | ICD-10-CM

## 2016-01-12 DIAGNOSIS — L97509 Non-pressure chronic ulcer of other part of unspecified foot with unspecified severity: Secondary | ICD-10-CM

## 2016-01-12 DIAGNOSIS — E1152 Type 2 diabetes mellitus with diabetic peripheral angiopathy with gangrene: Secondary | ICD-10-CM | POA: Diagnosis present

## 2016-01-12 DIAGNOSIS — M869 Osteomyelitis, unspecified: Secondary | ICD-10-CM | POA: Diagnosis present

## 2016-01-12 DIAGNOSIS — Z7982 Long term (current) use of aspirin: Secondary | ICD-10-CM | POA: Diagnosis not present

## 2016-01-12 DIAGNOSIS — L03031 Cellulitis of right toe: Secondary | ICD-10-CM | POA: Diagnosis present

## 2016-01-12 DIAGNOSIS — Z7902 Long term (current) use of antithrombotics/antiplatelets: Secondary | ICD-10-CM | POA: Diagnosis not present

## 2016-01-12 DIAGNOSIS — I10 Essential (primary) hypertension: Secondary | ICD-10-CM | POA: Diagnosis present

## 2016-01-12 HISTORY — DX: Anxiety disorder, unspecified: F41.9

## 2016-01-12 LAB — CBC WITH DIFFERENTIAL/PLATELET
BASOS PCT: 1 %
Basophils Absolute: 0.1 10*3/uL (ref 0–0.1)
Eosinophils Absolute: 0.3 10*3/uL (ref 0–0.7)
Eosinophils Relative: 2 %
HEMATOCRIT: 32.9 % — AB (ref 35.0–47.0)
HEMOGLOBIN: 10.8 g/dL — AB (ref 12.0–16.0)
LYMPHS ABS: 2.7 10*3/uL (ref 1.0–3.6)
LYMPHS PCT: 20 %
MCH: 26.3 pg (ref 26.0–34.0)
MCHC: 32.7 g/dL (ref 32.0–36.0)
MCV: 80.4 fL (ref 80.0–100.0)
MONO ABS: 0.8 10*3/uL (ref 0.2–0.9)
MONOS PCT: 6 %
NEUTROS ABS: 9.4 10*3/uL — AB (ref 1.4–6.5)
NEUTROS PCT: 71 %
Platelets: 678 10*3/uL — ABNORMAL HIGH (ref 150–440)
RBC: 4.09 MIL/uL (ref 3.80–5.20)
RDW: 14.1 % (ref 11.5–14.5)
WBC: 13.2 10*3/uL — ABNORMAL HIGH (ref 3.6–11.0)

## 2016-01-12 LAB — GLUCOSE, CAPILLARY
GLUCOSE-CAPILLARY: 150 mg/dL — AB (ref 65–99)
Glucose-Capillary: 165 mg/dL — ABNORMAL HIGH (ref 65–99)

## 2016-01-12 LAB — BASIC METABOLIC PANEL
ANION GAP: 8 (ref 5–15)
BUN: 11 mg/dL (ref 6–20)
CHLORIDE: 105 mmol/L (ref 101–111)
CO2: 23 mmol/L (ref 22–32)
Calcium: 9 mg/dL (ref 8.9–10.3)
Creatinine, Ser: 0.98 mg/dL (ref 0.44–1.00)
GFR calc non Af Amer: 54 mL/min — ABNORMAL LOW (ref >60)
Glucose, Bld: 200 mg/dL — ABNORMAL HIGH (ref 65–99)
POTASSIUM: 4.3 mmol/L (ref 3.5–5.1)
Sodium: 136 mmol/L (ref 135–145)

## 2016-01-12 MED ORDER — BISACODYL 10 MG RE SUPP: 10.0000 mg | Freq: Every day | RECTAL | Status: DC | PRN

## 2016-01-12 MED ORDER — HYDROCODONE-ACETAMINOPHEN 5-325 MG PO TABS: 1.0000 | ORAL_TABLET | ORAL | Status: DC | PRN

## 2016-01-12 MED ORDER — DOCUSATE SODIUM 100 MG PO CAPS
100.0000 mg | ORAL_CAPSULE | Freq: Two times a day (BID) | ORAL | Status: DC
  Administered 2016-01-12 – 2016-01-16 (×7): 100 mg via ORAL
  Filled 2016-01-12 (×7): qty 1

## 2016-01-12 MED ORDER — INSULIN GLARGINE 100 UNIT/ML ~~LOC~~ SOLN
15.0000 [IU] | Freq: Every day | SUBCUTANEOUS | Status: DC
  Administered 2016-01-12 – 2016-01-15 (×4): 15 [IU] via SUBCUTANEOUS
  Filled 2016-01-12 (×5): qty 0.15

## 2016-01-12 MED ORDER — POLYETHYLENE GLYCOL 3350 17 G PO PACK
17.0000 g | PACK | Freq: Every day | ORAL | Status: DC | PRN
Start: 2016-01-12 — End: 2016-01-16

## 2016-01-12 MED ORDER — INSULIN ASPART 100 UNIT/ML ~~LOC~~ SOLN
0.0000 [IU] | Freq: Three times a day (TID) | SUBCUTANEOUS | Status: DC
  Administered 2016-01-12: 3 [IU] via SUBCUTANEOUS
  Administered 2016-01-13: 2 [IU] via SUBCUTANEOUS
  Administered 2016-01-13: 3 [IU] via SUBCUTANEOUS
  Administered 2016-01-13: 2 [IU] via SUBCUTANEOUS
  Administered 2016-01-14: 5 [IU] via SUBCUTANEOUS
  Administered 2016-01-14 – 2016-01-15 (×3): 3 [IU] via SUBCUTANEOUS
  Administered 2016-01-15: 2 [IU] via SUBCUTANEOUS
  Administered 2016-01-16: 3 [IU] via SUBCUTANEOUS
  Filled 2016-01-12 (×5): qty 3
  Filled 2016-01-12: qty 2
  Filled 2016-01-12: qty 5
  Filled 2016-01-12: qty 3
  Filled 2016-01-12: qty 2
  Filled 2016-01-12 (×2): qty 3

## 2016-01-12 MED ORDER — ACETAMINOPHEN 650 MG RE SUPP: 650.0000 mg | Freq: Four times a day (QID) | RECTAL | Status: DC | PRN

## 2016-01-12 MED ORDER — SIMVASTATIN 20 MG PO TABS
20.0000 mg | ORAL_TABLET | Freq: Every day | ORAL | Status: DC
  Administered 2016-01-12 – 2016-01-16 (×5): 20 mg via ORAL
  Filled 2016-01-12 (×5): qty 1

## 2016-01-12 MED ORDER — CLOPIDOGREL BISULFATE 75 MG PO TABS
75.0000 mg | ORAL_TABLET | Freq: Every day | ORAL | Status: DC
  Administered 2016-01-15 – 2016-01-16 (×2): 75 mg via ORAL
  Filled 2016-01-12 (×2): qty 1

## 2016-01-12 MED ORDER — AMOXICILLIN-POT CLAVULANATE 600-42.9 MG/5ML PO SUSR
600.0000 mg | Freq: Two times a day (BID) | ORAL | Status: DC
  Administered 2016-01-12: 600 mg via ORAL
  Filled 2016-01-12 (×4): qty 5

## 2016-01-12 MED ORDER — ACETAMINOPHEN 325 MG PO TABS
650.0000 mg | ORAL_TABLET | Freq: Four times a day (QID) | ORAL | Status: DC | PRN
  Administered 2016-01-14: 650 mg via ORAL
  Filled 2016-01-12: qty 2

## 2016-01-12 MED ORDER — LISINOPRIL 20 MG PO TABS
20.0000 mg | ORAL_TABLET | Freq: Every day | ORAL | Status: DC
  Administered 2016-01-13 – 2016-01-16 (×4): 20 mg via ORAL
  Filled 2016-01-12 (×5): qty 1

## 2016-01-12 MED ORDER — ENOXAPARIN SODIUM 40 MG/0.4ML ~~LOC~~ SOLN
40.0000 mg | SUBCUTANEOUS | Status: DC
  Administered 2016-01-12 – 2016-01-15 (×3): 40 mg via SUBCUTANEOUS
  Filled 2016-01-12 (×3): qty 0.4

## 2016-01-12 NOTE — Consult Note (Signed)
Patient Demographics  Beth Mcguire, is a 75 y.o. female   MRN: [id number]   DOB - 1941/03/10  Admit Date - 01/12/2016    Outpatient Primary MD for the patient is Marden Noble, MD  Consult requested in the Hospital by Hillary Bow, MD, On 01/12/2016    Reason for consult ingrained to the right great toe   With History of -  Past Medical History  Diagnosis Date  . Diabetes ([place])   . Hypertension   . Colon cancer ([place])   . Anxiety       Past Surgical History  Procedure Laterality Date  . Colon surgery    . Peripheral vascular catheterization N/A 01/05/2016    Procedure: Lower Extremity Angiography;  Surgeon: Algernon Huxley, MD;  Location: [place] CV LAB;  Service: Cardiovascular;  Laterality: N/A;  . Peripheral vascular catheterization  01/05/2016    Procedure: Lower Extremity Intervention;  Surgeon: Algernon Huxley, MD;  Location: [place] CV LAB;  Service: Cardiovascular;;  . Tonsillectomy      HPI  Beth Mcguire  is a 75 y.o. female, patient is diabetic with a recent history of discoloration to the right great toe and underwent a right lower extremity angioplasty recently by Dr. Leotis Pain. Patient states that the second toe pinked up and did better but the great toes continue to stay discolored and darkened and is now relatively active. The tissue necrosis extends somewhat medially and proximally as well. She was seen in the office today by my partner Dr. Sharlotte Alamo and was admitted hospital for IV antibiotics and surgical treatment likely consisting of a first ray amputation   Social History Social History  Substance Use Topics  . Smoking status: Never Smoker   . Smokeless tobacco: Not on file  . Alcohol Use: Not on file     Family History Family History  Problem Relation Age of Onset  .  Diabetes Other      Prior to Admission medications   Medication Sig Start Date End Date Taking? Authorizing Provider  amoxicillin-clavulanate (AUGMENTIN) 875-125 MG tablet Take 1 tablet by mouth 2 (two) times daily. 01/06/16  Yes Srikar Sudini, MD  clopidogrel (PLAVIX) 75 MG tablet Take 1 tablet (75 mg total) by mouth daily. 01/06/16  Yes Srikar Sudini, MD  glucose monitoring kit (FREESTYLE) monitoring kit 1 each by Does not apply route 4 (four) times daily -  before meals and at bedtime. 01/06/16  Yes Srikar Sudini, MD  lisinopril (PRINIVIL,ZESTRIL) 20 MG tablet Take 20 mg by mouth daily.   Yes Historical Provider, MD  simvastatin (ZOCOR) 20 MG tablet Take 20 mg by mouth daily.   Yes Historical Provider, MD  aspirin 81 MG tablet Take 1 tablet (81 mg total) by mouth daily. 01/06/16   Srikar Sudini, MD  insulin lispro protamine-lispro (HUMALOG 75/25 MIX) (75-25) 100 UNIT/ML SUSP injection Inject 40 Units into the skin.    Historical Provider, MD    Anti-infectives    Start     Dose/Rate Route Frequency Ordered Stop   01/12/16 1800  amoxicillin-clavulanate (AUGMENTIN) 600-42.9 MG/5ML suspension 600 mg     600 mg Oral 2 times daily  01/12/16 1655        Scheduled Meds: . amoxicillin-clavulanate  600 mg Oral BID  . [START ON 01/13/2016] clopidogrel  75 mg Oral Daily  . docusate sodium  100 mg Oral BID  . enoxaparin (LOVENOX) injection  40 mg Subcutaneous Q24H  . insulin aspart  0-15 Units Subcutaneous TID WC  . insulin glargine  15 Units Subcutaneous QHS  . lisinopril  20 mg Oral Daily  . simvastatin  20 mg Oral Daily   Continuous Infusions:  PRN Meds:.acetaminophen **OR** acetaminophen, bisacodyl, HYDROcodone-acetaminophen, polyethylene glycol  No Known Allergies  Physical Exam  Vitals  Blood pressure 141/54, pulse 68, temperature 98.1 F (36.7 C), temperature source Oral, height '5\' 2"'  (1.575 m), weight 65.908 kg (145 lb 4.8 oz), SpO2 100 %.  Lower Extremity exam:  Vascular: DP  and PT pulses are nonpalpable bilateral. Patient has reasonable coloration to the second third fourth and fifth toes of the right foot but the right hallux is gangrenous completely discolored and is nonviable. Some of this nonviable tissue extends to the plantar medial area of that region as well.  Dermatological: Gangrene necrosis of skin as noted above. No significant cellulitis redness or progressive infection is noted.  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  Data Review  CBC No results for input(s): WBC, HGB, HCT, PLT, MCV, MCH, MCHC, RDW, LYMPHSABS, MONOABS, EOSABS, BASOSABS, BANDABS in the last 168 hours.  Invalid input(s): NEUTRABS, BANDSABD ------------------------------------------------------------------------------------------------------------------  Chemistries  No results for input(s): NA, K, CL, CO2, GLUCOSE, BUN, CREATININE, CALCIUM, MG, AST, ALT, ALKPHOS, BILITOT in the last 168 hours.  Invalid input(s): GFRCGP ------------------------------------------------------------------------------------------------------------------ estimated creatinine clearance is 40 mL/min (by C-G formula based on Cr of 1.05). ------------------------------------------------------------------------------------------------------------------ -----------------------------------------------------------------------  Urinalysis No results found for: COLORURINE, APPEARANCEUR, LABSPEC, PHURINE, GLUCOSEU, HGBUR, BILIRUBINUR, KETONESUR, PROTEINUR, UROBILINOGEN, NITRITE, LEUKOCYTESUR   Imaging results:   No results found.  Assessment & Plan: Patient has gangrene to the right hallux and this will need to be amputated. We'll likely need to do a first ray amputation with removal of part of the first metatarsal as well in order to be able to remove enough of the necrotic tissue from the region. Likely not be old close up  completely but hopefully we'll get primary closure 2 some areas. Asked for vascular consult today for input on vascular status to that right lower extremity.  Active Problems:   Toe ulcer due to DM [facility])   Foot osteomyelitis, right ([facility])     Perry Mount M.D on 01/12/2016 at 5:07 PM  Thank you for the consult, we will follow the patient with you in the Hospital.

## 2016-01-12 NOTE — H&P (Signed)
[facility] [place] NAME: Beth Mcguire    MR#:  [id number]  DATE OF BIRTH:  1941/05/27  DATE OF ADMISSION:  01/12/2016  PRIMARY CARE PHYSICIAN: Marden Noble, MD   REQUESTING/REFERRING PHYSICIAN: Dr. Caryl Comes  CHIEF COMPLAINT:  No chief complaint on file.  Right great toe gangrene HISTORY OF PRESENT ILLNESS:  Beth Mcguire  is a 75 y.o. female with a known history of hypertension, diabetes, peripheral arterial disease was recently in the hospital for right great toe cellulitis. She had angiogram with intervention on the right lower extremity by Dr. dew of vascular surgery. Podiatry started her on Augmentin and discharged home for further demarcation of the toe. With worsening patient has been admitted directly from the podiatry office for an dictation. She is afebrile. Has been ambulating with a walker. Has not noticed any pus.   PAST MEDICAL HISTORY:   Past Medical History  Diagnosis Date  . Diabetes ([place])   . Hypertension   . Colon cancer ([place])     PAST SURGICAL HISTORY:   Past Surgical History  Procedure Laterality Date  . Colon surgery    . Peripheral vascular catheterization N/A 01/05/2016    Procedure: Lower Extremity Angiography;  Surgeon: Algernon Huxley, MD;  Location: [place] CV LAB;  Service: Cardiovascular;  Laterality: N/A;  . Peripheral vascular catheterization  01/05/2016    Procedure: Lower Extremity Intervention;  Surgeon: Algernon Huxley, MD;  Location: [place] CV LAB;  Service: Cardiovascular;;    SOCIAL HISTORY:   Social History  Substance Use Topics  . Smoking status: Never Smoker   . Smokeless tobacco: Not on file  . Alcohol Use: Not on file    FAMILY HISTORY:   Family History  Problem Relation Age of Onset  . Diabetes Other     DRUG ALLERGIES:  No Known Allergies  REVIEW OF SYSTEMS:   Review of Systems  Constitutional: Positive for malaise/fatigue. Negative for fever, chills and  weight loss.  HENT: Negative for hearing loss and nosebleeds.   Eyes: Negative for blurred vision, double vision and pain.  Respiratory: Negative for cough, hemoptysis, sputum production, shortness of breath and wheezing.   Cardiovascular: Negative for chest pain, palpitations, orthopnea and leg swelling.  Gastrointestinal: Negative for nausea, vomiting, abdominal pain, diarrhea and constipation.  Genitourinary: Negative for dysuria and hematuria.  Musculoskeletal: Negative for myalgias, back pain and falls.  Skin: Positive for rash.  Neurological: Negative for dizziness, tremors, sensory change, speech change, focal weakness, seizures and headaches.  Endo/Heme/Allergies: Does not bruise/bleed easily.  Psychiatric/Behavioral: Negative for depression and memory loss. The patient is not nervous/anxious.     MEDICATIONS AT HOME:   Prior to Admission medications   Medication Sig Start Date End Date Taking? Authorizing Provider  amoxicillin-clavulanate (AUGMENTIN) 875-125 MG tablet Take 1 tablet by mouth 2 (two) times daily. 01/06/16   Hillary Bow, MD  aspirin 81 MG tablet Take 1 tablet (81 mg total) by mouth daily. 01/06/16   Hillary Bow, MD  clopidogrel (PLAVIX) 75 MG tablet Take 1 tablet (75 mg total) by mouth daily. 01/06/16   Thailan Sava, MD  glucose monitoring kit (FREESTYLE) monitoring kit 1 each by Does not apply route 4 (four) times daily -  before meals and at bedtime. 01/06/16   Rylyn Zawistowski, MD  insulin lispro protamine-lispro (HUMALOG 75/25 MIX) (75-25) 100 UNIT/ML SUSP injection Inject 40 Units into the skin.    Historical Provider, MD  lisinopril (PRINIVIL,ZESTRIL) 20 MG tablet Take 20 mg by mouth daily.    Historical Provider, MD  simvastatin (ZOCOR) 20 MG tablet Take 20 mg by mouth daily.    Historical Provider, MD     VITAL SIGNS:  There were no vitals taken for this visit.  PHYSICAL EXAMINATION:  Physical Exam  GENERAL:  75 y.o.-year-old patient lying in the bed with  no acute distress.  EYES: Pupils equal, round, reactive to light and accommodation. No scleral icterus. Extraocular muscles intact.  HEENT: Head atraumatic, normocephalic. Oropharynx and nasopharynx clear. No oropharyngeal erythema, moist oral mucosa  NECK:  Supple, no jugular venous distention. No thyroid enlargement, no tenderness.  LUNGS: Normal breath sounds bilaterally, no wheezing, rales, rhonchi. No use of accessory muscles of respiration.  CARDIOVASCULAR: S1, S2 normal. No murmurs, rubs, or gallops.  ABDOMEN: Soft, nontender, nondistended. Bowel sounds present. No organomegaly or mass.  EXTREMITIES: No pedal edema, cyanosis, or clubbing. + 2 pedal & radial pulses b/l.   NEUROLOGIC: Cranial nerves II through XII are intact. No focal Motor or sensory deficits appreciated b/l PSYCHIATRIC: The patient is alert and oriented x 3. Good affect.  SKIN: Right foot with great toe gangrene and swelling. No discharge. Surrounding cellulitis.  LABORATORY PANEL:   CBC No results for input(s): WBC, HGB, HCT, PLT in the last 168 hours. ------------------------------------------------------------------------------------------------------------------  Chemistries  No results for input(s): NA, K, CL, CO2, GLUCOSE, BUN, CREATININE, CALCIUM, MG, AST, ALT, ALKPHOS, BILITOT in the last 168 hours.  Invalid input(s): GFRCGP ------------------------------------------------------------------------------------------------------------------  Cardiac Enzymes No results for input(s): TROPONINI in the last 168 hours. ------------------------------------------------------------------------------------------------------------------  RADIOLOGY:  No results found.   IMPRESSION AND PLAN:   * Right foot great toe gangrene with surrounding cellulitis Consult podiatry for cellulitis. Check CBC and BMP. Continue Augmentin. As needed pain medication  * Insulin independent diabetes mellitus Start Lantus 15  units which she was on during the recent hospital stay with good control. Sliding scale insulin  * Hypertension Continue lisinopril  * DVT prophylaxis with Lovenox  All the records are reviewed and case discussed with ED provider. Management plans discussed with the patient, family and they are in agreement.  CODE STATUS: Full code  TOTAL TIME TAKING CARE OF THIS PATIENT: 40 minutes.   Hillary Bow R M.D on 01/12/2016 at 4:48 PM  Between 7am to 6pm - Pager - [phone]  After 6pm go to [email or web address] - password [email or web address] [facility]  Office  [phone]  CC: Primary care physician; Marden Noble, MD  Note: This dictation was prepared with Dragon dictation along with smaller phrase technology. Any transcriptional errors that result from this process are unintentional.

## 2016-01-13 LAB — GLUCOSE, CAPILLARY
GLUCOSE-CAPILLARY: 134 mg/dL — AB (ref 65–99)
GLUCOSE-CAPILLARY: 150 mg/dL — AB (ref 65–99)
GLUCOSE-CAPILLARY: 176 mg/dL — AB (ref 65–99)
Glucose-Capillary: 193 mg/dL — ABNORMAL HIGH (ref 65–99)

## 2016-01-13 MED ORDER — AMOXICILLIN-POT CLAVULANATE 875-125 MG PO TABS
1.0000 | ORAL_TABLET | Freq: Two times a day (BID) | ORAL | Status: DC
  Administered 2016-01-13 – 2016-01-16 (×7): 1 via ORAL
  Filled 2016-01-13 (×7): qty 1

## 2016-01-13 MED ORDER — PIPERACILLIN-TAZOBACTAM 3.375 G IVPB
3.3750 g | Freq: Once | INTRAVENOUS | Status: AC
  Administered 2016-01-14: 3.375 g via INTRAVENOUS
  Filled 2016-01-13 (×2): qty 50

## 2016-01-13 MED ORDER — INSULIN ASPART 100 UNIT/ML ~~LOC~~ SOLN
3.0000 [IU] | Freq: Three times a day (TID) | SUBCUTANEOUS | Status: DC
  Administered 2016-01-13 – 2016-01-16 (×7): 3 [IU] via SUBCUTANEOUS
  Filled 2016-01-13 (×9): qty 3

## 2016-01-13 MED ORDER — GLUCERNA SHAKE PO LIQD
237.0000 mL | ORAL | Status: DC
  Administered 2016-01-14 – 2016-01-15 (×2): 237 mL via ORAL

## 2016-01-13 NOTE — Progress Notes (Signed)
Initial Nutrition Assessment  DOCUMENTATION CODES:   Not applicable  INTERVENTION:   -Cater to pt preferences on Carb Modified Diet Order -Will recommend Glucerna Shake po daily, supplement provides 220 kcal and 10 grams of protein   NUTRITION DIAGNOSIS:   Inadequate oral intake related to poor appetite as evidenced by per patient/family report.  GOAL:   Patient will meet greater than or equal to 90% of their needs  MONITOR:   PO intake, Supplement acceptance, Labs, Weight trends, I & O's  REASON FOR ASSESSMENT:   Malnutrition Screening Tool    ASSESSMENT:   Pt admitted with right big toe gangrene scheduled for amputation 4/8 per MD note.   Past Medical History  Diagnosis Date  . Diabetes ([place])   . Hypertension   . Colon cancer ([place])   . Anxiety     Diet Order:  Diet Carb Modified Fluid consistency:: Thin; Room service appropriate?: Yes Diet NPO time specified    Current Nutrition: Pt reports eating very well at lunch today, pleased her appetite has returned. 100% of lunch tray observed. Pt reports appetite started to go down when she started antibiotics roughly one week ago. Pt reports eating but only wanting softer foods. Pt reports never trying Ensure/Glucerna before.   Scheduled Medications:  . amoxicillin-clavulanate  1 tablet Oral Q12H  . clopidogrel  75 mg Oral Daily  . docusate sodium  100 mg Oral BID  . enoxaparin (LOVENOX) injection  40 mg Subcutaneous Q24H  . insulin aspart  0-15 Units Subcutaneous TID WC  . insulin aspart  3 Units Subcutaneous TID WC  . insulin glargine  15 Units Subcutaneous QHS  . lisinopril  20 mg Oral Daily  . simvastatin  20 mg Oral Daily   Labs: reviewed, Glu 200 this am  Gastrointestinal Profile: Last BM:  01/13/2016   Nutrition-Focused Physical Exam Findings: Nutrition-Focused physical exam completed. Findings are WDL for fat depletion, muscle depletion, and edema.    Weight Change: Pt reports weight has been  stable PTA her usual body weight of 160-162lbs. Current measured weight of 145lbs in CHL. Will follow weight trend, as daily weights ordered.   Skin:   (Diabetic foot ulcer)  Height:   Ht Readings from Last 1 Encounters:  01/12/16 5\' 2"  (1.575 m)    Weight:   Wt Readings from Last 1 Encounters:  01/13/16 145 lb 4.8 oz (65.908 kg)     BMI:  Body mass index is 26.57 kg/(m^2).  Estimated Nutritional Needs:   Kcal:  1650-1980kcals  Protein:  66-80g protein  Fluid:  1.9-2.1L  EDUCATION NEEDS:   No education needs identified at this time  Dwyane Luo, RD, LDN Pager [phone] Weekend/On-Call Pager [phone]

## 2016-01-13 NOTE — Care Management Note (Signed)
Case Management Note  Patient Details  Name: Beth Mcguire MRN: [id number] Date of Birth: 12/10/1940  Subjective/Objective:                   Met with patient, husband, and daughter to discuss discharge planning. She did not accept home health last visit but agrees this visit if needed. She has a walker and a wheelchair  available at home. Her PCP is Dr. Brynda Greathouse. She has lots of concerns about her diabetes Rx and states Dr. Brynda Greathouse has increased her Humalog injections. Her glucometer only needed batteries and now it works- she still has glucometer Rx from last visit. Action/Plan:    List of home health agencies left with patient's husband. RNCM will continue to follow.  Expected Discharge Date:                  Expected Discharge Plan:     In-House Referral:     Discharge planning Services  CM Consult  Post Acute Care Choice:  Home Health, Durable Medical Equipment Choice offered to:  Patient  DME Arranged:    DME Agency:     HH Arranged:    [place] Agency:     Status of Service:  In process, will continue to follow  Medicare Important Message Given:    Date Medicare IM Given:    Medicare IM give by:    Date Additional Medicare IM Given:    Additional Medicare Important Message give by:     If discussed at [place] of Stay Meetings, dates discussed:    Additional Comments:  Marshell Garfinkel, RN 01/13/2016, 11:42 AM

## 2016-01-13 NOTE — Progress Notes (Signed)
Patient Demographics  Beth Mcguire, is a 75 y.o. female   MRN: [id number]   DOB - 1941/09/16  Admit Date - 01/12/2016    Outpatient Primary MD for the patient is Marden Noble, MD  Consult requested in the Hospital by Bettey Costa, MD, On 01/13/2016       With History of -  Past Medical History  Diagnosis Date  . Diabetes ([place])   . Hypertension   . Colon cancer ([place])   . Anxiety       Past Surgical History  Procedure Laterality Date  . Colon surgery    . Peripheral vascular catheterization N/A 01/05/2016    Procedure: Lower Extremity Angiography;  Surgeon: Algernon Huxley, MD;  Location: [facility];  Service: Cardiovascular;  Laterality: N/A;  . Peripheral vascular catheterization  01/05/2016    Procedure: Lower Extremity Intervention;  Surgeon: Algernon Huxley, MD;  Location: [facility];  Service: Cardiovascular;;  . Tonsillectomy      HPI  Beth Mcguire  is a 75 y.o. 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 Underwent revascularization last week and has some improvement but still some compromised circulation. Great toe since that time frame has become more gangrenous and has a dry gangrene associated with this point. The entire aspect of the toes involved including some extension proximally along the plantar medial metatarsal head area.    Review of Systems    In addition to the HPI above,  No Fever-chills, No Headache, No changes with Vision or hearing, No problems swallowing food or Liquids, No Chest pain, Cough or Shortness of Breath, No Abdominal pain, No Nausea or Vommitting, Bowel movements are regular, No Blood in stool or Urine, No dysuria, No new skin rashes or bruises, No new joints pains-aches,  No new weakness, tingling, numbness in any extremity, No  recent weight gain or loss, No polyuria, polydypsia or polyphagia, No significant Mental Stressors.  A full 10 point Review of Systems was done, except as stated above, all other Review of Systems were negative.   Social History Social History  Substance Use Topics  . Smoking status: Never Smoker   . Smokeless tobacco: Not on file  . Alcohol Use: Not on file     Family History Family History  Problem Relation Age of Onset  . Diabetes Other     Prior to Admission medications   Medication Sig Start Date End Date Taking? Authorizing Provider  amoxicillin-clavulanate (AUGMENTIN) 875-125 MG tablet Take 1 tablet by mouth 2 (two) times daily. 01/06/16  Yes Srikar Sudini, MD  clopidogrel (PLAVIX) 75 MG tablet Take 1 tablet (75 mg total) by mouth daily. 01/06/16  Yes Srikar Sudini, MD  glucose monitoring kit (FREESTYLE) monitoring kit 1 each by Does not apply route 4 (four) times daily -  before meals and at bedtime. 01/06/16  Yes Srikar Sudini, MD  lisinopril (PRINIVIL,ZESTRIL) 20 MG tablet Take 20 mg by mouth daily.   Yes Historical Provider, MD  simvastatin (ZOCOR) 20 MG tablet Take 20 mg by mouth daily.   Yes Historical Provider, MD  aspirin 81 MG tablet Take 1 tablet (81 mg  total) by mouth daily. 01/06/16   Srikar Sudini, MD  insulin lispro protamine-lispro (HUMALOG 75/25 MIX) (75-25) 100 UNIT/ML SUSP injection Inject 40 Units into the skin.    Historical Provider, MD    Anti-infectives    Start     Dose/Rate Route Frequency Ordered Stop   01/13/16 1230  amoxicillin-clavulanate (AUGMENTIN) 875-125 MG per tablet 1 tablet     1 tablet Oral Every 12 hours 01/13/16 1220     01/12/16 1800  amoxicillin-clavulanate (AUGMENTIN) 600-42.9 MG/5ML suspension 600 mg  Status:  Discontinued     600 mg Oral 2 times daily 01/12/16 1655 01/13/16 1220      Scheduled Meds: . amoxicillin-clavulanate  1 tablet Oral Q12H  . clopidogrel  75 mg Oral Daily  . docusate sodium  100 mg Oral BID  .  enoxaparin (LOVENOX) injection  40 mg Subcutaneous Q24H  . insulin aspart  0-15 Units Subcutaneous TID WC  . insulin aspart  3 Units Subcutaneous TID WC  . insulin glargine  15 Units Subcutaneous QHS  . lisinopril  20 mg Oral Daily  . simvastatin  20 mg Oral Daily   Continuous Infusions:  PRN Meds:.acetaminophen **OR** acetaminophen, bisacodyl, HYDROcodone-acetaminophen, polyethylene glycol  No Known Allergies  Physical Exam  Vitals  Blood pressure 158/46, pulse 72, temperature 98.7 F (37.1 C), temperature source Oral, resp. rate 18, height 5' 2" (1.575 m), weight 65.908 kg (145 lb 4.8 oz), SpO2 99 %.  Lower Extremity exam:  Vascular: Nonpalpable  Dermatological: Gangrene to right first toe and first ray as noted above.  Neurological: Significant neuropathy secondary to diabetes  Ortho: No gross deformities  Data Review  CBC  Recent Labs Lab 01/12/16 1709  WBC 13.2*  HGB 10.8*  HCT 32.9*  PLT 678*  MCV 80.4  MCH 26.3  MCHC 32.7  RDW 14.1  LYMPHSABS 2.7  MONOABS 0.8  EOSABS 0.3  BASOSABS 0.1   ------------------------------------------------------------------------------------------------------------------  Chemistries   Recent Labs Lab 01/12/16 1709  NA 136  K 4.3  CL 105  CO2 23  GLUCOSE 200*  BUN 11  CREATININE 0.98  CALCIUM 9.0   ---Urinalysis No results found for: COLORURINE, APPEARANCEUR, LABSPEC, PHURINE, GLUCOSEU, HGBUR, BILIRUBINUR, KETONESUR, PROTEINUR, UROBILINOGEN, NITRITE, LEUKOCYTESUR   Assessment & Plan: 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 No chance of reestablishment of viable tissue to the area. Plan: Scheduled for first ray amputation tomorrow morning.  Active Problems:   Toe ulcer due to DM [facility])   Foot osteomyelitis, right (HCC)  Perry Mount M.D on 01/13/2016 at 2:38 PM

## 2016-01-13 NOTE — Progress Notes (Signed)
Per Dr Elvina Mattes, hold plavix and lovenox until after surgery tomorrow.

## 2016-01-13 NOTE — Progress Notes (Signed)
Inpatient Diabetes Program Recommendations  AACE/ADA: New Consensus Statement on Inpatient Glycemic Control (2015)  Target Ranges:  Prepandial:   less than 140 mg/dL      Peak postprandial:   less than 180 mg/dL (1-2 hours)      Critically ill patients:  140 - 180 mg/dL   Consult to review insulin at home versus inpatient regimen here. Spoke with patient and daughter to clarify they regimen we have her on here Lantus and Novolog versus the 75/25 insulin taken at home. Patient and daughter understands and have no further questions at this time.   Thanks,  Tama Headings RN, MSN, [facility] Inpatient Diabetes Coordinator Team Pager [phone] (8a-5p)

## 2016-01-13 NOTE — Progress Notes (Signed)
[facility] at [place] NAME: Beth Mcguire    MR#:  [id number]  DATE OF BIRTH:  21-Dec-1940  SUBJECTIVE:  Doing well this am No acute issues. No pain in right lower extremity.  REVIEW OF SYSTEMS:    Review of Systems  Constitutional: Negative for fever, chills and malaise/fatigue.  HENT: Negative for ear discharge, ear pain, hearing loss, nosebleeds and sore throat.   Eyes: Negative for blurred vision and pain.  Respiratory: Negative for cough, hemoptysis, shortness of breath and wheezing.   Cardiovascular: Negative for chest pain, palpitations and leg swelling.  Gastrointestinal: Negative for nausea, vomiting, abdominal pain, diarrhea and blood in stool.  Genitourinary: Negative for dysuria.  Musculoskeletal: Negative for back pain.  Skin:       GANGRENE FIRST BIG TO RIGHT FOOT  Neurological: Negative for dizziness, tremors, speech change, focal weakness, seizures and headaches.  Endo/Heme/Allergies: Does not bruise/bleed easily.  Psychiatric/Behavioral: Negative for depression, suicidal ideas and hallucinations.    Tolerating Diet:yes      DRUG ALLERGIES:  No Known Allergies  VITALS:  Blood pressure 158/46, pulse 72, temperature 98.7 F (37.1 C), temperature source Oral, resp. rate 18, height 5\' 2"  (1.575 m), weight 65.908 kg (145 lb 4.8 oz), SpO2 99 %.  PHYSICAL EXAMINATION:   Physical Exam  Constitutional: She is oriented to person, place, and time and well-developed, well-nourished, and in no distress. No distress.  HENT:  Head: Normocephalic.  Eyes: No scleral icterus.  Neck: Normal range of motion. Neck supple. No JVD present. No tracheal deviation present.  Cardiovascular: Normal rate, regular rhythm and normal heart sounds.  Exam reveals no gallop and no friction rub.   No murmur heard. Pulmonary/Chest: Effort normal and breath sounds normal. No respiratory distress. She has no wheezes. She has no rales. She  exhibits no tenderness.  Abdominal: Soft. Bowel sounds are normal. She exhibits no distension and no mass. There is no tenderness. There is no rebound and no guarding.  Musculoskeletal: Normal range of motion. She exhibits no edema.  Neurological: She is alert and oriented to person, place, and time.  Skin: Skin is warm.  Gangrene of first big toe  Psychiatric: Affect and judgment normal.      LABORATORY PANEL:   CBC  Recent Labs Lab 01/12/16 1709  WBC 13.2*  HGB 10.8*  HCT 32.9*  PLT 678*   ------------------------------------------------------------------------------------------------------------------  Chemistries   Recent Labs Lab 01/12/16 1709  NA 136  K 4.3  CL 105  CO2 23  GLUCOSE 200*  BUN 11  CREATININE 0.98  CALCIUM 9.0   ------------------------------------------------------------------------------------------------------------------  Cardiac Enzymes No results for input(s): TROPONINI in the last 168 hours. ------------------------------------------------------------------------------------------------------------------  RADIOLOGY:  No results found.   ASSESSMENT AND PLAN:    75 year old female with a history of diabetes, essential hypertension and PAD with recent hospitalization for right great toe cellulitis and angioplasty of right lower extremity who presents with gangrene to the right hallux.  1. Right big toe gangrene: Plan for amputation tomorrow by podiatry.  Continue Augmentin.  2. Essential hypertension: Continue lisinopril. Blood pressure is acceptable.  3. Diabetes: Continue Lantus and sliding scale insulin. Add NovoLog 3 units 3 times a day as per recommendations by diabetes coordinator.  4. PAD status post revascularization: Continue Plavix. Vascular surgery consult placed.   Management plans discussed with the patient and she is in agreement.  CODE STATUS: FULL  TOTAL TIME TAKING CARE OF THIS PATIENT: 30 minutes.  POSSIBLE D/C tomorrow, DEPENDING ON CLINICAL CONDITION.   Sohrab Keelan M.D on 01/13/2016 at 10:58 AM  Between 7am to 6pm - Pager - [phone] After 6pm go to [email or web address] - password [email or web address] [facility]  Office  [phone]  CC: Primary care physician; Marden Noble, MD  Note: This dictation was prepared with Dragon dictation along with smaller phrase technology. Any transcriptional errors that result from this process are unintentional.

## 2016-01-13 NOTE — Progress Notes (Signed)
Inpatient Diabetes Program Recommendations  AACE/ADA: New Consensus Statement on Inpatient Glycemic Control (2015)  Target Ranges:  Prepandial:   less than 140 mg/dL      Peak postprandial:   less than 180 mg/dL (1-2 hours)      Critically ill patients:  140 - 180 mg/dL   Review of Glycemic Control  Results for Beth Mcguire, Beth Mcguire (MRN [id number]) as of 01/13/2016 09:54  Ref. Range 01/12/2016 18:08 01/12/2016 21:37 01/13/2016 08:14  Glucose-Capillary Latest Ref Range: 65-99 mg/dL 165 (H) 150 (H) 134 (H)   Diabetes history: Type 2, A1C 9.5% on 01/05/16 Outpatient Diabetes medications: Humalog 72/25 insulin 40 units  Current orders for Inpatient glycemic control: Novolog 0-15 units tid, Lantus 15 units qhs  Inpatient Diabetes Program Recommendations: Improved fasting blood sugar after starting Lantus 15 units last night. Consider starting Novolog 3 units tid with meals (hold if she eats less than 50%) - continue Novolog correction as ordered.  Gentry Fitz, RN, BA, MHA, CDE Diabetes Coordinator Inpatient Diabetes Program  [phone] (Team Pager) [phone] ([place]) 01/13/2016 9:58 AM

## 2016-01-14 ENCOUNTER — Inpatient Hospital Stay: Payer: Medicare Other | Admitting: Anesthesiology

## 2016-01-14 ENCOUNTER — Encounter
Admission: AD | Disposition: A | Discharge status: Skilled Nursing Facility | Payer: Self-pay | Source: Ambulatory Visit | Attending: Internal Medicine

## 2016-01-14 ENCOUNTER — Encounter: Payer: Self-pay | Admitting: Anesthesiology

## 2016-01-14 HISTORY — PX: AMPUTATION: SHX166

## 2016-01-14 LAB — CBC
HEMATOCRIT: 31.3 % — AB (ref 35.0–47.0)
HEMOGLOBIN: 10.2 g/dL — AB (ref 12.0–16.0)
MCH: 26.2 pg (ref 26.0–34.0)
MCHC: 32.5 g/dL (ref 32.0–36.0)
MCV: 80.6 fL (ref 80.0–100.0)
Platelets: 595 10*3/uL — ABNORMAL HIGH (ref 150–440)
RBC: 3.88 MIL/uL (ref 3.80–5.20)
RDW: 14.2 % (ref 11.5–14.5)
WBC: 11.4 10*3/uL — ABNORMAL HIGH (ref 3.6–11.0)

## 2016-01-14 LAB — BASIC METABOLIC PANEL
ANION GAP: 5 (ref 5–15)
BUN: 11 mg/dL (ref 6–20)
CHLORIDE: 107 mmol/L (ref 101–111)
CO2: 23 mmol/L (ref 22–32)
Calcium: 9 mg/dL (ref 8.9–10.3)
Creatinine, Ser: 0.85 mg/dL (ref 0.44–1.00)
GFR calc non Af Amer: >60 mL/min (ref >60)
Glucose, Bld: 190 mg/dL — ABNORMAL HIGH (ref 65–99)
POTASSIUM: 4 mmol/L (ref 3.5–5.1)
SODIUM: 135 mmol/L (ref 135–145)

## 2016-01-14 LAB — GLUCOSE, CAPILLARY
GLUCOSE-CAPILLARY: 161 mg/dL — AB (ref 65–99)
GLUCOSE-CAPILLARY: 203 mg/dL — AB (ref 65–99)
GLUCOSE-CAPILLARY: 154 mg/dL — AB (ref 65–99)
Glucose-Capillary: 152 mg/dL — ABNORMAL HIGH (ref 65–99)

## 2016-01-14 SURGERY — AMPUTATION, FOOT, RAY
Anesthesia: General | Laterality: Right | Wound class: Dirty or Infected

## 2016-01-14 MED ORDER — SODIUM CHLORIDE 0.9 % IR SOLN
Status: DC | PRN
  Administered 2016-01-14: 3000 mL

## 2016-01-14 MED ORDER — EPHEDRINE SULFATE 50 MG/ML IJ SOLN
INTRAMUSCULAR | Status: DC | PRN
  Administered 2016-01-14: 10 mg via INTRAVENOUS

## 2016-01-14 MED ORDER — FENTANYL CITRATE (PF) 100 MCG/2ML IJ SOLN
INTRAMUSCULAR | Status: DC | PRN
  Administered 2016-01-14 (×4): 25 ug via INTRAVENOUS

## 2016-01-14 MED ORDER — LIDOCAINE HCL (CARDIAC) 20 MG/ML IV SOLN
INTRAVENOUS | Status: DC | PRN
  Administered 2016-01-14: 60 mg via INTRAVENOUS

## 2016-01-14 MED ORDER — ONDANSETRON HCL 4 MG/2ML IJ SOLN: 4.0000 mg | Freq: Once | INTRAMUSCULAR | Status: DC | PRN

## 2016-01-14 MED ORDER — FENTANYL CITRATE (PF) 100 MCG/2ML IJ SOLN: 25.0000 ug | INTRAMUSCULAR | Status: DC | PRN

## 2016-01-14 MED ORDER — PROPOFOL 10 MG/ML IV BOLUS
INTRAVENOUS | Status: DC | PRN
  Administered 2016-01-14: 100 mg via INTRAVENOUS

## 2016-01-14 MED ORDER — BUPIVACAINE HCL 0.5 % IJ SOLN
INTRAMUSCULAR | Status: DC | PRN
  Administered 2016-01-14: 12 mL

## 2016-01-14 MED ORDER — LACTATED RINGERS IV SOLN
INTRAVENOUS | Status: DC | PRN
  Administered 2016-01-14: 09:00:00 via INTRAVENOUS

## 2016-01-14 SURGICAL SUPPLY — 39 items
BANDAGE ELASTIC 4 LF NS (GAUZE/BANDAGES/DRESSINGS) ×3 IMPLANT
BANDAGE STRETCH 3X4.1 STRL (GAUZE/BANDAGES/DRESSINGS) ×3 IMPLANT
BLADE MED AGGRESSIVE (BLADE) ×3 IMPLANT
BLADE SURG 15 STRL LF DISP TIS (BLADE) ×4 IMPLANT
BLADE SURG 15 STRL SS (BLADE) ×8
BNDG ESMARK 4X12 TAN STRL LF (GAUZE/BANDAGES/DRESSINGS) ×3 IMPLANT
BNDG GAUZE 4.5X4.1 6PLY STRL (MISCELLANEOUS) ×3 IMPLANT
CANISTER SUCT 1200ML W/VALVE (MISCELLANEOUS) ×3 IMPLANT
CUFF TOURN 18 STER (MISCELLANEOUS) IMPLANT
CUFF TOURN DUAL PL 12 NO SLV (MISCELLANEOUS) ×3 IMPLANT
DRAPE FLUOR MINI C-ARM 54X84 (DRAPES) IMPLANT
DURAPREP 26ML APPLICATOR (WOUND CARE) ×3 IMPLANT
ELECT REM PT RETURN 9FT ADLT (ELECTROSURGICAL) ×3
ELECTRODE REM PT RTRN 9FT ADLT (ELECTROSURGICAL) ×1 IMPLANT
GAUZE PETRO XEROFOAM 1X8 (MISCELLANEOUS) ×3 IMPLANT
GAUZE SPONGE 4X4 12PLY STRL (GAUZE/BANDAGES/DRESSINGS) ×3 IMPLANT
GLOVE BIO SURGEON STRL SZ8 (GLOVE) ×3 IMPLANT
GLOVE INDICATOR 7.5 STRL GRN (GLOVE) ×3 IMPLANT
GOWN STRL REUS W/ TWL LRG LVL3 (GOWN DISPOSABLE) ×2 IMPLANT
GOWN STRL REUS W/TWL LRG LVL3 (GOWN DISPOSABLE) ×4
HANDPIECE VERSAJET DEBRIDEMENT (MISCELLANEOUS) ×3 IMPLANT
KIT DRSG VAC SLVR GRANUFM (MISCELLANEOUS) ×3 IMPLANT
KIT RM TURNOVER STRD PROC AR (KITS) ×3 IMPLANT
LABEL OR SOLS (LABEL) IMPLANT
NDL SAFETY 18GX1.5 (NEEDLE) ×3 IMPLANT
NEEDLE HYPO 25X1 1.5 SAFETY (NEEDLE) ×3 IMPLANT
NS IRRIG 500ML POUR BTL (IV SOLUTION) ×3 IMPLANT
PACK EXTREMITY ARMC (MISCELLANEOUS) ×3 IMPLANT
PENCIL ELECTRO HAND CTR (MISCELLANEOUS) IMPLANT
RASP SM TEAR CROSS CUT (RASP) IMPLANT
STOCKINETTE STRL 6IN 960660 (GAUZE/BANDAGES/DRESSINGS) ×3 IMPLANT
SUT ETH BLK MONO 3 0 FS 1 12/B (SUTURE) IMPLANT
SUT ETHILON 3 0 PS 1 (SUTURE) ×12 IMPLANT
SUT ETHILON 5 0 PS 2 18 (SUTURE) IMPLANT
SUT VIC AB 3-0 PS2 18 (SUTURE) ×3 IMPLANT
SUT VIC AB 4-0 FS2 27 (SUTURE) ×3 IMPLANT
SWAB CULTURE AMIES ANAERIB BLU (MISCELLANEOUS) ×3 IMPLANT
SYRINGE 10CC LL (SYRINGE) ×6 IMPLANT
VERSAJET EXACT ×3 IMPLANT

## 2016-01-14 NOTE — Progress Notes (Signed)
[place] at [place] NAME: Hillari Ishida    MR#:  [id number]  DATE OF BIRTH:  Jul 22, 1941  SUBJECTIVE:  s/p  Amputation today.  REVIEW OF SYSTEMS:    Review of Systems  Constitutional: Negative for fever, chills and malaise/fatigue.  HENT: Negative for ear discharge, ear pain, hearing loss, nosebleeds and sore throat.   Eyes: Negative for blurred vision and pain.  Respiratory: Negative for cough, hemoptysis, shortness of breath and wheezing.   Cardiovascular: Negative for chest pain, palpitations and leg swelling.  Gastrointestinal: Negative for nausea, vomiting, abdominal pain, diarrhea and blood in stool.  Genitourinary: Negative for dysuria.  Musculoskeletal: Negative for back pain.  Skin: no rash or jaundice. Neurological: Negative for dizziness, tremors, speech change, focal weakness, seizures and headaches.  Endo/Heme/Allergies: Does not bruise/bleed easily.  Psychiatric/Behavioral: Negative for depression, suicidal ideas and hallucinations.    Tolerating Diet:yes      DRUG ALLERGIES:  No Known Allergies  VITALS:  Blood pressure 161/44, pulse 65, temperature 97.9 F (36.6 C), temperature source Oral, resp. rate 18, height 5\' 2"  (1.575 m), weight 66.271 kg (146 lb 1.6 oz), SpO2 100 %.  PHYSICAL EXAMINATION:   Physical Exam  Constitutional: She is oriented to person, place, and time and well-developed, well-nourished, and in no distress. No distress.  HENT:  Head: Normocephalic.  Eyes: No scleral icterus.  Neck: Normal range of motion. Neck supple. No JVD present. No tracheal deviation present.  Cardiovascular: Normal rate, regular rhythm and normal heart sounds.  Exam reveals no gallop and no friction rub.   No murmur heard. Pulmonary/Chest: Effort normal and breath sounds normal. No respiratory distress. She has no wheezes. She has no rales. She exhibits no tenderness.  Abdominal: Soft. Bowel sounds are normal.  She exhibits no distension and no mass. There is no tenderness. There is no rebound and no guarding.  Musculoskeletal: Normal range of motion. She exhibits no edema. right foot in dressing with wound VAC. Neurological: She is alert and oriented to person, place, and time.  Skin: Skin is warm.  Psychiatric: Affect and judgment normal.      LABORATORY PANEL:   CBC  Recent Labs Lab 01/14/16 0413  WBC 11.4*  HGB 10.2*  HCT 31.3*  PLT 595*   ------------------------------------------------------------------------------------------------------------------  Chemistries   Recent Labs Lab 01/14/16 0413  NA 135  K 4.0  CL 107  CO2 23  GLUCOSE 190*  BUN 11  CREATININE 0.85  CALCIUM 9.0   ------------------------------------------------------------------------------------------------------------------  Cardiac Enzymes No results for input(s): TROPONINI in the last 168 hours. ------------------------------------------------------------------------------------------------------------------  RADIOLOGY:  No results found.   ASSESSMENT AND PLAN:    75 year old female with a history of diabetes, essential hypertension and PAD with recent hospitalization for right great toe cellulitis and angioplasty of right lower extremity who presents with gangrene to the right hallux.  1. Right big toe gangrene: s/p  Amputation (First ray amputation and removal of gangrenous necrotic tissue right foot) today and on wound VAC with bloody drainage. Continue Augmentin.  2. Essential hypertension: Continue lisinopril. Blood pressure is acceptable.  3. Diabetes: Continue Lantus and sliding scale insulin. Added NovoLog 3 units 3 times a day as per recommendations by diabetes coordinator.  4. PAD status post revascularization: hold today, resume Plavix from tomorrow. Vascular surgery consult placed.   Management plans discussed with the patient and her husband, they are in agreement.  CODE  STATUS: FULL  TOTAL TIME TAKING CARE OF THIS  PATIENT: 35 minutes.     POSSIBLE D/C2 days, DEPENDING ON CLINICAL CONDITION.   Demetrios Loll M.D on 01/14/2016 at 1:37 PM  Between 7am to 6pm - Pager - [phone] After 6pm go to [email or web address] - password [email or web address] [facility]  Office  [phone]  CC: Primary care physician; Marden Noble, MD  Note: This dictation was prepared with Dragon dictation along with smaller phrase technology. Any transcriptional errors that result from this process are unintentional.

## 2016-01-14 NOTE — Progress Notes (Signed)
Patient due for plavix and lovenox today. Called MD and will hold plavix until tomorrow.

## 2016-01-14 NOTE — Op Note (Signed)
Operative note   Surgeon: Dr. Albertine Patricia, DPM.    Assistant: None    Preop diagnosis: Gangrene right hallux and first ray    Postop diagnosis: Same    Procedure:   1. First ray amputation and removal of gangrenous necrotic tissue right foot          EBL: 35 cc    Anesthesia:general delivered by the anesthesia team. Local block delivered by me with 12 cc of 0.5% Marcaine plain    Hemostasis: Ankle tourniquet 250 mils mercury pressure for 10 minutes    Specimen: Amputated great toe and first ray secondary to gangrene    Complications: None    Operative indications: Gangrene right first ray    Procedure:  Patient was brought into the OR and placed on the operating table in thesupine position. After anesthesia was obtained theright lower extremity was prepped and draped in usual sterile fashion.  Operative Report: At this time attention was directed to the right foot where the tissues were closely inspected. There is a large swath of gangrenous tissue including the entire great toe extending medially and plantarly about 4 cm proximally and is about 2 and half centimeters wide. There is probed with a 20-gauge needle and some bleeding occurred in the tissues that were close to this distally on the dorsum of the great toe. Incision was made at this time trying to preserve all the healthy tissue that seemed present on the area of the great toe and first ray. Basically a an ellipse was performed around the gangrenous great toe and gangrenous tissue and the toe was removed at the MTP joint in toto. Some tissue damage was noted at the distal portion of this area with better tissue proximally. At this time the metatarsal was freed up about two thirds of the way proximally on the first metatarsal and a power equipment was used to osteotomize the bone and the distal portion first metatarsal was removed. The sesamoid apparatus was also removed this timeframe. At this time a versa jet was used to  remove all nonviable tissue from the deep and superficial fascia. This was mostly concentrated the distal portion once this was removed healthier tissue was noted. The tourniquet had been released prior to versa jetting as soon as the great toe and the metatarsal were removed. Bleeding gradually occurred in all facets of the incision margin. Starting more strongly at the proximal healthier tissue. After copious irrigation the deep fascial layer and capsular tissue was closed with 3-0 Vicryl simple interrupted stitch. Skin was then closed proximally to distally with 3-0 nylon simple interrupted sutures. The wound was able to be closed with primary closure approximately 80-90% of the wound. The very distal portion had some gapping and I didn't want to put tension on the skin so I elected to leave that open and wound VAC the region. This will also allow any drainage to occur that may be associated with any type of superficial infection to the area. A stimulate more granulation tissue coming does distal tissues overall. Wound VAC was placed in that area and also over the incision margin. Extra padding was placed across the area consisting of 4 x 4's Kerlix and an Ace wrap.   Patient tolerated the procedure and anesthesia well.  Was transported from the OR to the PACU with all vital signs stable and vascular status intact. To be discharged per routine protocol.  Will follow up in approximately 1 week in the outpatient clinic.

## 2016-01-14 NOTE — Anesthesia Preprocedure Evaluation (Addendum)
Anesthesia Evaluation  Patient identified by MRN, date of birth, ID band Patient awake    Reviewed: Allergy & Precautions, NPO status , Patient's Chart, lab work & pertinent test results  Airway Mallampati: II  TM Distance: >3 FB     Dental  (+) Poor Dentition, Missing, Chipped   Pulmonary neg pulmonary ROS,    Pulmonary exam normal breath sounds clear to auscultation       Cardiovascular hypertension, Pt. on medications + Peripheral Vascular Disease  Normal cardiovascular exam     Neuro/Psych Anxiety negative neurological ROS     GI/Hepatic negative GI ROS, Neg liver ROS,   Endo/Other  diabetes, Well Controlled, Type 2, Insulin Dependent, Oral Hypoglycemic Agents  Renal/GU negative Renal ROS  negative genitourinary   Musculoskeletal Gangrene of foot   Abdominal Normal abdominal exam  (+)   Peds negative pediatric ROS (+)  Hematology negative hematology ROS (+)   Anesthesia Other Findings   Reproductive/Obstetrics                           Anesthesia Physical Anesthesia Plan  ASA: III and emergent  Anesthesia Plan: General   Post-op Pain Management:    Induction: Intravenous  Airway Management Planned: LMA  Additional Equipment:   Intra-op Plan:   Post-operative Plan: Extubation in OR  Informed Consent: I have reviewed the patients History and Physical, chart, labs and discussed the procedure including the risks, benefits and alternatives for the proposed anesthesia with the patient or authorized representative who has indicated his/her understanding and acceptance.   Dental advisory given  Plan Discussed with: CRNA and Surgeon  Anesthesia Plan Comments:        Anesthesia Quick Evaluation

## 2016-01-14 NOTE — Anesthesia Procedure Notes (Signed)
Procedure Name: LMA Insertion Date/Time: 01/14/2016 9:18 AM Performed by: Johnna Acosta Pre-anesthesia Checklist: Patient identified, Emergency Drugs available, Suction available, Patient being monitored and Timeout performed Patient Re-evaluated:Patient Re-evaluated prior to inductionOxygen Delivery Method: Circle system utilized Preoxygenation: Pre-oxygenation with 100% oxygen Intubation Type: IV induction LMA: LMA inserted LMA Size: 3.5 Tube type: Oral Number of attempts: 1 Placement Confirmation: positive ETCO2 and breath sounds checked- equal and bilateral Tube secured with: Tape Dental Injury: Teeth and Oropharynx as per pre-operative assessment

## 2016-01-14 NOTE — Transfer of Care (Signed)
Immediate Anesthesia Transfer of Care Note  Patient: Beth Mcguire  Procedure(s) Performed: Procedure(s): AMPUTATION RAY (Right)  Patient Location: PACU  Anesthesia Type:General  Level of Consciousness: sedated  Airway & Oxygen Therapy: Patient Spontanous Breathing and Patient connected to face mask oxygen  Post-op Assessment: Report given to RN and Post -op Vital signs reviewed and stable  Post vital signs: Reviewed and stable  Last Vitals:  Filed Vitals:   01/14/16 0727 01/14/16 1025  BP: 162/57 150/51  Pulse: 73 66  Temp: 36.8 C 36.8 C  Resp: 18 13    Complications: No apparent anesthesia complications

## 2016-01-14 NOTE — Anesthesia Postprocedure Evaluation (Signed)
Anesthesia Post Note  Patient: Beth Mcguire  Procedure(s) Performed: Procedure(s) (LRB): AMPUTATION RAY (Right)  Patient location during evaluation: PACU Anesthesia Type: General Level of consciousness: awake and alert and oriented Pain management: pain level controlled Vital Signs Assessment: post-procedure vital signs reviewed and stable Respiratory status: spontaneous breathing Cardiovascular status: blood pressure returned to baseline Anesthetic complications: no    Last Vitals:  Filed Vitals:   01/14/16 1124 01/14/16 1206  BP: 168/44 171/48  Pulse: 64 69  Temp: 36.4 C 36.4 C  Resp: 18 18    Last Pain:  Filed Vitals:   01/14/16 1206  PainSc: 4                  Michi Herrmann

## 2016-01-14 NOTE — H&P (Signed)
H and P has been reviewed and no changes are noted.  

## 2016-01-15 LAB — GLUCOSE, CAPILLARY
GLUCOSE-CAPILLARY: 141 mg/dL — AB (ref 65–99)
GLUCOSE-CAPILLARY: 187 mg/dL — AB (ref 65–99)
GLUCOSE-CAPILLARY: 145 mg/dL — AB (ref 65–99)
Glucose-Capillary: 153 mg/dL — ABNORMAL HIGH (ref 65–99)

## 2016-01-15 NOTE — Progress Notes (Signed)
[facility] NAME: Beth Mcguire    MR#:  [id number]  DATE OF BIRTH:  26-Nov-1940  SUBJECTIVE:  No complaint.  s/p  Amputation yesterday.  REVIEW OF SYSTEMS:    Review of Systems  Constitutional: Negative for fever, chills and malaise/fatigue.  HENT: Negative for ear discharge, ear pain, hearing loss, nosebleeds and sore throat.   Eyes: Negative for blurred vision and pain.  Respiratory: Negative for cough, hemoptysis, shortness of breath and wheezing.   Cardiovascular: Negative for chest pain, palpitations and leg swelling.  Gastrointestinal: Negative for nausea, vomiting, abdominal pain, diarrhea and blood in stool.  Genitourinary: Negative for dysuria.  Musculoskeletal: Negative for back pain.  Skin: no rash or jaundice. Neurological: Negative for dizziness, tremors, speech change, focal weakness, seizures and headaches.  Endo/Heme/Allergies: Does not bruise/bleed easily.  Psychiatric/Behavioral: Negative for depression, suicidal ideas and hallucinations.    Tolerating Diet:yes      DRUG ALLERGIES:  No Known Allergies  VITALS:  Blood pressure 146/50, pulse 67, temperature 98.9 F (37.2 C), temperature source Oral, resp. rate 18, height 5\' 2"  (1.575 m), weight 64.864 kg (143 lb), SpO2 98 %.  PHYSICAL EXAMINATION:   Physical Exam  Constitutional: She is oriented to person, place, and time and well-developed, well-nourished, and in no distress. No distress.  HENT:  Head: Normocephalic.  Eyes: No scleral icterus.  Neck: Normal range of motion. Neck supple. No JVD present. No tracheal deviation present.  Cardiovascular: Normal rate, regular rhythm and normal heart sounds.  Exam reveals no gallop and no friction rub.   No murmur heard. Pulmonary/Chest: Effort normal and breath sounds normal. No respiratory distress. She has no wheezes. She has no rales. She exhibits no tenderness.  Abdominal: Soft. Bowel sounds  are normal. She exhibits no distension and no mass. There is no tenderness. There is no rebound and no guarding.  Musculoskeletal: Normal range of motion. She exhibits no edema. right foot in dressing with wound VAC. Neurological: She is alert and oriented to person, place, and time.  Skin: Skin is warm.  Psychiatric: Affect and judgment normal.      LABORATORY PANEL:   CBC  Recent Labs Lab 01/14/16 0413  WBC 11.4*  HGB 10.2*  HCT 31.3*  PLT 595*   ------------------------------------------------------------------------------------------------------------------  Chemistries   Recent Labs Lab 01/14/16 0413  NA 135  K 4.0  CL 107  CO2 23  GLUCOSE 190*  BUN 11  CREATININE 0.85  CALCIUM 9.0   ------------------------------------------------------------------------------------------------------------------  Cardiac Enzymes No results for input(s): TROPONINI in the last 168 hours. ------------------------------------------------------------------------------------------------------------------  RADIOLOGY:  No results found.   ASSESSMENT AND PLAN:    75 year old female with a history of diabetes, essential hypertension and PAD with recent hospitalization for right great toe cellulitis and angioplasty of right lower extremity who presents with gangrene to the right hallux.  1. Right big toe gangrene: s/p  Amputation (First ray amputation and removal of gangrenous necrotic tissue right foot) and on wound VAC. Continue Augmentin.  2. Essential hypertension: Continue lisinopril. Blood pressure is acceptable.  3. Diabetes: Continue Lantus and sliding scale insulin. Added NovoLog 3 units 3 times a day as per recommendations by diabetes coordinator.  4. PAD status post revascularization: hold for surgery and resume Plavix from today. Follow-up Vascular surgery consult.  I discussed with Dr. Elvina Mattes and case manager. The patient will need home health and home wound  VAC. Management plans discussed with the patient and her  husband, they are in agreement.  CODE STATUS: FULL  TOTAL TIME TAKING CARE OF THIS PATIENT: 35 minutes.     POSSIBLE D/C 1-2 days, DEPENDING ON CLINICAL CONDITION.   Demetrios Loll M.D on 01/15/2016 at 1:25 PM  Between 7am to 6pm - Pager - [phone] After 6pm go to [email or web address] - password [email or web address] [email or web address]  Office  [phone]  CC: Primary care physician; Marden Noble, MD  Note: This dictation was prepared with Dragon dictation along with smaller phrase technology. Any transcriptional errors that result from this process are unintentional.

## 2016-01-15 NOTE — Evaluation (Signed)
Physical Therapy Evaluation Patient Details Name: Beth Mcguire MRN: [id number] DOB: Jun 30, 1941 Today's Date: 01/15/2016   History of Present Illness  75 yo female with onset of R foot ulcer with amputation of first ray R great toe with wound vac.  Pt has history of DM, colon CA, HTN, anxiety.  MD has removed PT order after PT had evaluated pt.  Clinical Impression  Pt was assessed before today's order was removed and will continue as scheduled tomorrow since she only got over to chair with PT and had RLE elevated on 2 pillows.  Will continue on with transfer training but has 3 steps to ascend on her house to enter, no ramp and cannot WB on RLE.  Recommend inpt therapy to recover and restore independence.    Follow Up Recommendations SNF    Equipment Recommendations  None recommended by PT (await SNF disposition)    Recommendations for Other Services Rehab consult     Precautions / Restrictions Precautions Precautions: Fall Restrictions Weight Bearing Restrictions: Yes RLE Weight Bearing: Non weight bearing      Mobility  Bed Mobility Overal bed mobility: Needs Assistance Bed Mobility: Supine to Sit     Supine to sit: Min assist        Transfers Overall transfer level: Needs assistance Equipment used: Rolling walker (2 wheeled);1 person hand held assist Transfers: Stand Pivot Transfers;Sit to/from Stand Sit to Stand: Min assist;Mod assist Stand pivot transfers: Min assist;Mod assist       General transfer comment: reminders for hand placement  Ambulation/Gait             General Gait Details: unable  Stairs            Wheelchair Mobility    Modified Rankin (Stroke Patients Only)       Balance Overall balance assessment: Needs assistance Sitting-balance support: Single extremity supported Sitting balance-Leahy Scale: Fair     Standing balance support: During functional activity Standing balance-Leahy Scale: Poor                                Pertinent Vitals/Pain Pain Assessment: Faces Faces Pain Scale: Hurts little more Pain Location: R foot Pain Intervention(s): Limited activity within patient's tolerance;Monitored during session;Premedicated before session;Repositioned    Home Living Family/patient expects to be discharged to:: Private residence Living Arrangements: Spouse/significant other;Other (Comment) Available Help at Discharge: Family;Available 24 hours/day Type of Home: House Home Access: Stairs to enter Entrance Stairs-Rails: Right;Left;Can reach both Entrance Stairs-Number of Steps: 3 Home Layout: Two level Home Equipment: Walker - 2 wheels;Wheelchair - manual      Prior Function Level of Independence: Needs assistance   Gait / Transfers Assistance Needed: wc or RW with no assist  ADL's / Homemaking Assistance Needed: husband to help        Hand Dominance        Extremity/Trunk Assessment   Upper Extremity Assessment: Overall WFL for tasks assessed           Lower Extremity Assessment: Generalized weakness      Cervical / Trunk Assessment: Normal  Communication   Communication: No difficulties  Cognition Arousal/Alertness: Awake/alert Behavior During Therapy: WFL for tasks assessed/performed Overall Cognitive Status: Within Functional Limits for tasks assessed                      General Comments General comments (skin integrity, edema, etc.): Pt was unable to  get up without help to chair but did sit with 2 pillow support to RLE in reclined sitting position    Exercises        Assessment/Plan    PT Assessment Patient needs continued PT services  PT Diagnosis Generalized weakness   PT Problem List Decreased strength;Decreased range of motion;Decreased activity tolerance;Decreased balance;Decreased mobility;Decreased coordination;Decreased knowledge of use of DME;Decreased knowledge of precautions;Cardiopulmonary status limiting  activity;Pain;Decreased skin integrity  PT Treatment Interventions DME instruction;Gait training;Stair training;Functional mobility training;Therapeutic activities;Therapeutic exercise;Balance training;Neuromuscular re-education;Patient/family education   PT Goals (Current goals can be found in the Care Plan section) Acute Rehab PT Goals Patient Stated Goal: to sit in chair PT Goal Formulation: With patient/family Time For Goal Achievement: 01/29/16 Potential to Achieve Goals: Good    Frequency 7X/week   Barriers to discharge Inaccessible home environment;Decreased caregiver support husband will not be able to get her up NWB on stairs    Co-evaluation               End of Session   Activity Tolerance: Patient limited by fatigue Patient left: in chair;with call bell/phone within reach;with chair alarm set;with family/visitor present Nurse Communication: Mobility status         Time: [id number] PT Time Calculation (min) (ACUTE ONLY): 24 min   Charges:   PT Evaluation $PT Eval Moderate Complexity: 1 Procedure PT Treatments $Therapeutic Activity: 8-22 mins   PT G Codes:        Ramond Dial 2016/02/13, 1:29 PM   Mee Hives, PT MS Acute Rehab Dept. Number: [phone] [phone] and [place] [phone]

## 2016-01-15 NOTE — Plan of Care (Signed)
Problem: Acute Rehab PT Goals(only PT should resolve) Goal: Pt Will Go Up/Down Stairs With NWB on RLE

## 2016-01-15 NOTE — Progress Notes (Signed)
Patient Demographics  Beth Mcguire, is a 75 y.o. female   MRN: [id number]   DOB - 1940/12/11  Admit Date - 01/12/2016    Outpatient Primary MD for the patient is Marden Noble, MD  Consult requested in the Hospital by Demetrios Loll, MD, On 01/15/2016     With History of -  Past Medical History  Diagnosis Date  . Diabetes ([place])   . Hypertension   . Colon cancer ([place])   . Anxiety       Past Surgical History  Procedure Laterality Date  . Colon surgery    . Peripheral vascular catheterization N/A 01/05/2016    Procedure: Lower Extremity Angiography;  Surgeon: Algernon Huxley, MD;  Location: [place] [facility];  Service: Cardiovascular;  Laterality: N/A;  . Peripheral vascular catheterization  01/05/2016    Procedure: Lower Extremity Intervention;  Surgeon: Algernon Huxley, MD;  Location: [place] [facility];  Service: Cardiovascular;;  . Tonsillectomy      HPI  Beth Mcguire  is a 75 y.o. female, admitted several days ago for gangrene to the right great toe and plantar foot. She underwent surgery yesterday for amputation of the first ray.   Social History Social History  Substance Use Topics  . Smoking status: Never Smoker   . Smokeless tobacco: Not on file  . Alcohol Use: Not on file     Family History Family History  Problem Relation Age of Onset  . Diabetes Other      Prior to Admission medications   Medication Sig Start Date End Date Taking? Authorizing Provider  amoxicillin-clavulanate (AUGMENTIN) 875-125 MG tablet Take 1 tablet by mouth 2 (two) times daily. 01/06/16  Yes Srikar Sudini, MD  clopidogrel (PLAVIX) 75 MG tablet Take 1 tablet (75 mg total) by mouth daily. 01/06/16  Yes Srikar Sudini, MD  glucose monitoring kit (FREESTYLE) monitoring kit 1 each by Does not apply route 4 (four) times  daily -  before meals and at bedtime. 01/06/16  Yes Srikar Sudini, MD  lisinopril (PRINIVIL,ZESTRIL) 20 MG tablet Take 20 mg by mouth daily.   Yes Historical Provider, MD  simvastatin (ZOCOR) 20 MG tablet Take 20 mg by mouth daily.   Yes Historical Provider, MD  aspirin 81 MG tablet Take 1 tablet (81 mg total) by mouth daily. 01/06/16   Srikar Sudini, MD  insulin lispro protamine-lispro (HUMALOG 75/25 MIX) (75-25) 100 UNIT/ML SUSP injection Inject 40 Units into the skin.    Historical Provider, MD    Anti-infectives    Start     Dose/Rate Route Frequency Ordered Stop   01/14/16 0745  piperacillin-tazobactam (ZOSYN) IVPB 3.375 g     3.375 g 12.5 mL/hr over 240 Minutes Intravenous  Once 01/13/16 1742 01/14/16 0933   01/13/16 1230  amoxicillin-clavulanate (AUGMENTIN) 875-125 MG per tablet 1 tablet     1 tablet Oral Every 12 hours 01/13/16 1220     01/12/16 1800  amoxicillin-clavulanate (AUGMENTIN) 600-42.9 MG/5ML suspension 600 mg  Status:  Discontinued     600 mg Oral 2 times daily 01/12/16 1655 01/13/16 1220      Scheduled Meds: . amoxicillin-clavulanate  1 tablet Oral Q12H  . clopidogrel  75 mg Oral Daily  . docusate sodium  100 mg Oral BID  . enoxaparin (LOVENOX) injection  40 mg Subcutaneous Q24H  . feeding supplement (GLUCERNA SHAKE)  237 mL Oral Q24H  . insulin aspart  0-15 Units Subcutaneous TID WC  . insulin aspart  3 Units Subcutaneous TID WC  . insulin glargine  15 Units Subcutaneous QHS  . lisinopril  20 mg Oral Daily  . simvastatin  20 mg Oral Daily   Continuous Infusions:  PRN Meds:.acetaminophen **OR** acetaminophen, bisacodyl, HYDROcodone-acetaminophen, polyethylene glycol  No Known Allergies  Physical Exam  Vitals  Blood pressure 146/50, pulse 67, temperature 98.9 F (37.2 C), temperature source Oral, resp. rate 18, height '5\' 2"'  (1.575 m), weight 64.864 kg (143 lb), SpO2 98 %.  Lower Extremity exam: Wound VAC is intact. No swelling or redness to the right foot  or leg with exposed. Wound VAC is functional. Data Review  CBC  Recent Labs Lab 01/12/16 1709 01/14/16 0413  WBC 13.2* 11.4*  HGB 10.8* 10.2*  HCT 32.9* 31.3*  PLT 678* 595*  MCV 80.4 80.6  MCH 26.3 26.2  MCHC 32.7 32.5  RDW 14.1 14.2  LYMPHSABS 2.7  --   MONOABS 0.8  --   EOSABS 0.3  --   BASOSABS 0.1  --    ------------------------------------------------------------------------------------------------------------------  Chemistries   Recent Labs Lab 01/12/16 1709 01/14/16 0413  NA 136 135  K 4.3 4.0  CL 105 107  CO2 23 23  GLUCOSE 200* 190*  BUN 11 11  CREATININE 0.98 0.85  CALCIUM 9.0 9.0   ------------------------------------------------------------------------------------------------------------------ estimated creatinine clearance is 49 mL/min (by C-G formula based on Cr of 0.85). -----  Assessment & Plan: Patient is stable to this juncture. White count still slightly elevated but down considerably from first at part of admission. Surgery yesterday when well with 85% closure of the wound wound VAC applied to the open area and also along the incision margin. Plan: Leave the wound VAC intact. [place] patient bed rest today but start physical therapy tomorrow to help her with transfer to chair and possible bathroom privileges. Once she is able to do these effectively she should be able to go home as long as home health care and wound VAC care is available.  Active Problems:   Toe ulcer due to DM ([place])   Foot osteomyelitis, right ([place])   Family Communication: Plan discussed with patient and family.  Perry Mount M.D on 01/15/2016 at 10:15 AM  Thank you for the consult, we will follow the patient with you in the Hospital.

## 2016-01-15 NOTE — Addendum Note (Signed)
Addendum  created 01/15/16 0603 by Alvin Critchley, MD   Modules edited: Clinical Notes   Clinical Notes:  File: [id number]

## 2016-01-16 ENCOUNTER — Encounter: Payer: Self-pay | Admitting: Podiatry

## 2016-01-16 LAB — GLUCOSE, CAPILLARY
GLUCOSE-CAPILLARY: 118 mg/dL — AB (ref 65–99)
Glucose-Capillary: 188 mg/dL — ABNORMAL HIGH (ref 65–99)

## 2016-01-16 MED ORDER — HYDROCODONE-ACETAMINOPHEN 5-325 MG PO TABS: 1.0000 | ORAL_TABLET | Freq: Four times a day (QID) | ORAL | Status: DC | PRN

## 2016-01-16 MED ORDER — INSULIN ASPART 100 UNIT/ML ~~LOC~~ SOLN: 3.0000 [IU] | Freq: Three times a day (TID) | SUBCUTANEOUS | Status: DC

## 2016-01-16 MED ORDER — INSULIN GLARGINE 100 UNIT/ML ~~LOC~~ SOLN: 15.0000 [IU] | Freq: Every day | SUBCUTANEOUS | Status: DC

## 2016-01-16 MED ORDER — BISACODYL 10 MG RE SUPP: 10.0000 mg | Freq: Every day | RECTAL | Status: DC | PRN

## 2016-01-16 MED ORDER — INSULIN ASPART 100 UNIT/ML ~~LOC~~ SOLN: 0.0000 [IU] | Freq: Three times a day (TID) | SUBCUTANEOUS | Status: DC

## 2016-01-16 MED ORDER — DOCUSATE SODIUM 100 MG PO CAPS: 100.0000 mg | ORAL_CAPSULE | Freq: Two times a day (BID) | ORAL | Status: DC

## 2016-01-16 MED ORDER — AMOXICILLIN-POT CLAVULANATE 875-125 MG PO TABS: 1.0000 | ORAL_TABLET | Freq: Two times a day (BID) | ORAL | Status: DC

## 2016-01-16 MED ORDER — POLYETHYLENE GLYCOL 3350 17 G PO PACK: 17.0000 g | PACK | Freq: Every day | ORAL | Status: DC | PRN

## 2016-01-16 NOTE — Clinical Social Work Note (Signed)
Clinical Social Work Assessment  Patient Details  Name: Beth Mcguire MRN: [id number] Date of Birth: 06-08-1941  Date of referral:  01/16/16               Reason for consult:  Facility Placement                Permission sought to share information with:  Family Supports, Customer service manager Permission granted to share information::  Yes, Verbal Permission Granted  Name::     [name] [phone]  Agency::  [facility]  Relationship::  yes  Contact Information:  yes  Housing/Transportation Living arrangements for the past 2 months:  [place] of Information:  Patient, Spouse Patient Interpreter Needed:  None Criminal Activity/Legal Involvement Pertinent to Current Situation/Hospitalization:  No - Comment as needed Significant Relationships:  Adult Children, Spouse, [place] Lives with:  Spouse Do you feel safe going back to the place where you live?  Yes Need for family participation in patient care:  Yes (Comment)  Care giving concerns:  Wound vac required    Social Worker assessment / plan:  LCSW met with patient and her husband she is oriented and to person place and time. She is married for 77 years and 3 children ,6  Children and 4 great grand kids. Patient is agreeable for short term rehab at [facility] is the patients choice. Patient is full code. She had her right toe amputated and requires PT. She is diabetic. Patient has good hearing, sight and good cognition.  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) PT Recommendations:  [place] / Referral to community resources:  [place]  Patient/Family's Response to care:  Wants short term rehab  Patient/Family's Understanding of and Emotional Response to Diagnosis, Current Treatment, and Prognosis:  Good  Emotional Assessment Appearance:  Appears stated  age Attitude/Demeanor/Rapport:    Calm and pleasant Affect (typically observed):  Accepting, Adaptable, Happy Orientation:  Oriented to Self, Oriented to Place, Oriented to  Time, Oriented to Situation Alcohol / Substance use:  Never Used Psych involvement (Current and /or in the community):  No (Comment)  Discharge Needs  Concerns to be addressed:  Discharge Planning Concerns Readmission within the last 30 days:  No Current discharge risk:  None Barriers to Discharge:  No Barriers Identified   Beth Reamer, LCSW 01/16/2016, 11:43 AM

## 2016-01-16 NOTE — Care Management Important Message (Signed)
Important Message  Patient Details  Name: Beth Mcguire MRN: [id number] Date of Birth: Feb 24, 1941   Medicare Important Message Given:  Yes    Juliann Pulse A Usher Hedberg 01/16/2016, 11:04 AM

## 2016-01-16 NOTE — Discharge Summary (Signed)
[facility] [place] NAME: Beth Mcguire    MR#:  [id number]  DATE OF BIRTH:  1940/11/01  DATE OF ADMISSION:  01/12/2016 ADMITTING PHYSICIAN: Hillary Bow, MD  DATE OF DISCHARGE: 01/16/2016 PRIMARY CARE PHYSICIAN: Marden Noble, MD    ADMISSION DIAGNOSIS:  gangrene rt foot Gangrene right foot   DISCHARGE DIAGNOSIS:  Right big toe gangrene  SECONDARY DIAGNOSIS:   Past Medical History  Diagnosis Date  . Diabetes ([place])   . Hypertension   . Colon cancer ([place])   . Anxiety     HOSPITAL COURSE:   75 year old female with a history of diabetes, essential hypertension and PAD with recent hospitalization for right great toe cellulitis and angioplasty of right lower extremity who presents with gangrene to the right hallux.  1. Right big toe gangrene: s/p Amputation (First ray amputation and removal of gangrenous necrotic tissue right foot) and on wound VAC. Continue Augmentin.  2. Essential hypertension: Continue lisinopril. Blood pressure is acceptable.  3. Diabetes: Continue Lantus and sliding scale insulin. Added NovoLog 3 units 3 times a day as per recommendations by diabetes coordinator.  4. PAD status post revascularization: hold for surgery and resumed Plavix after surgery. Follow-up Vascular surgeon as outpatient.  DISCHARGE CONDITIONS:  Stable, discharge to SNF today.  CONSULTS OBTAINED:     DRUG ALLERGIES:  No Known Allergies  DISCHARGE MEDICATIONS:   Current Discharge Medication List    START taking these medications   Details  bisacodyl (DULCOLAX) 10 MG suppository Place 1 suppository (10 mg total) rectally daily as needed for moderate constipation. Qty: 12 suppository, Refills: 0    docusate sodium (COLACE) 100 MG capsule Take 1 capsule (100 mg total) by mouth 2 (two) times daily. Qty: 10 capsule, Refills: 0    HYDROcodone-acetaminophen (NORCO/VICODIN) 5-325 MG tablet Take 1 tablet by mouth every 6  (six) hours as needed for moderate pain. Qty: 15 tablet, Refills: 0    !! insulin aspart (NOVOLOG) 100 UNIT/ML injection Inject 0-15 Units into the skin 3 (three) times daily with meals. Qty: 10 mL, Refills: 11    !! insulin aspart (NOVOLOG) 100 UNIT/ML injection Inject 3 Units into the skin 3 (three) times daily with meals. Qty: 10 mL, Refills: 11    insulin glargine (LANTUS) 100 UNIT/ML injection Inject 0.15 mLs (15 Units total) into the skin at bedtime. Qty: 10 mL, Refills: 11    polyethylene glycol (MIRALAX / GLYCOLAX) packet Take 17 g by mouth daily as needed for mild constipation. Qty: 14 each, Refills: 0     !! - Potential duplicate medications found. Please discuss with provider.    CONTINUE these medications which have CHANGED   Details  amoxicillin-clavulanate (AUGMENTIN) 875-125 MG tablet Take 1 tablet by mouth 2 (two) times daily. Qty: 14 tablet, Refills: 0      CONTINUE these medications which have NOT CHANGED   Details  clopidogrel (PLAVIX) 75 MG tablet Take 1 tablet (75 mg total) by mouth daily. Qty: 90 tablet, Refills: 0    glucose monitoring kit (FREESTYLE) monitoring kit 1 each by Does not apply route 4 (four) times daily -  before meals and at bedtime. Qty: 1 each, Refills: 0    lisinopril (PRINIVIL,ZESTRIL) 20 MG tablet Take 20 mg by mouth daily.    simvastatin (ZOCOR) 20 MG tablet Take 20 mg by mouth daily.    aspirin 81 MG tablet Take 1 tablet (81 mg total) by mouth daily.  STOP taking these medications     insulin lispro protamine-lispro (HUMALOG 75/25 MIX) (75-25) 100 UNIT/ML SUSP injection          DISCHARGE INSTRUCTIONS:   If you experience worsening of your admission symptoms, develop shortness of breath, life threatening emergency, suicidal or homicidal thoughts you must seek medical attention immediately by calling 911 or calling your MD immediately  if symptoms less severe.  You Must read complete instructions/literature along with  all the possible adverse reactions/side effects for all the Medicines you take and that have been prescribed to you. Take any new Medicines after you have completely understood and accept all the possible adverse reactions/side effects.   Please note  You were cared for by a hospitalist during your hospital stay. If you have any questions about your discharge medications or the care you received while you were in the hospital after you are discharged, you can call the unit and asked to speak with the hospitalist on call if the hospitalist that took care of you is not available. Once you are discharged, your primary care physician will handle any further medical issues. Please note that NO REFILLS for any discharge medications will be authorized once you are discharged, as it is imperative that you return to your primary care physician (or establish a relationship with a primary care physician if you do not have one) for your aftercare needs so that they can reassess your need for medications and monitor your lab values.    Today   SUBJECTIVE   No complaint. On wound VAC, no bloody drainage.   VITAL SIGNS:  Blood pressure 161/56, pulse 86, temperature 98 F (36.7 C), temperature source Oral, resp. rate 18, height '5\' 2"'  (1.575 m), weight 64.864 kg (143 lb), SpO2 97 %.  I/O:   Intake/Output Summary (Last 24 hours) at 01/16/16 1109 Last data filed at 01/15/16 1853  Gross per 24 hour  Intake    360 ml  Output    800 ml  Net   -440 ml    PHYSICAL EXAMINATION:  GENERAL:  75 y.o.-year-old patient lying in the bed with no acute distress.  EYES: Pupils equal, round, reactive to light and accommodation. No scleral icterus. Extraocular muscles intact.  HEENT: Head atraumatic, normocephalic. Oropharynx and nasopharynx clear.  NECK:  Supple, no jugular venous distention. No thyroid enlargement, no tenderness.  LUNGS: Normal breath sounds bilaterally, no wheezing, rales,rhonchi or crepitation. No  use of accessory muscles of respiration.  CARDIOVASCULAR: S1, S2 normal. No murmurs, rubs, or gallops.  ABDOMEN: Soft, non-tender, non-distended. Bowel sounds present. No organomegaly or mass.  EXTREMITIES: No pedal edema, cyanosis, or clubbing. Right foot in dressing with wound VAC. NEUROLOGIC: Cranial nerves II through XII are intact. Muscle strength 5/5 in all extremities. Sensation intact. Gait not checked.  PSYCHIATRIC: The patient is alert and oriented x 3.  SKIN: No obvious rash, lesion, or ulcer.   DATA REVIEW:   CBC  Recent Labs Lab 01/14/16 0413  WBC 11.4*  HGB 10.2*  HCT 31.3*  PLT 595*    Chemistries   Recent Labs Lab 01/14/16 0413  NA 135  K 4.0  CL 107  CO2 23  GLUCOSE 190*  BUN 11  CREATININE 0.85  CALCIUM 9.0    Cardiac Enzymes No results for input(s): TROPONINI in the last 168 hours.  Microbiology Results  Results for orders placed or performed during the hospital encounter of 01/12/16  Wound culture     Status: None (Preliminary  result)   Collection Time: 01/14/16 10:03 AM  Result Value Ref Range Status   Specimen Description FOOT  Final   Special Requests NONE  Final   Gram Stain FEW WBC SEEN NO ORGANISMS SEEN   Final   Culture NO GROWTH < 24 HOURS  Final   Report Status PENDING  Incomplete    RADIOLOGY:  No results found.      Management plans discussed with the patient, her husband and they are in agreement.  CODE STATUS:     Code Status Orders        Start     Ordered   01/12/16 1645  Full code   Continuous     01/12/16 1645    Code Status History    Date Active Date Inactive Code Status Order ID Comments User Context   01/04/2016  5:56 PM 01/06/2016 10:08 PM Full Code [id number]  Epifanio Lesches, MD Inpatient      TOTAL TIME TAKING CARE OF THIS PATIENT: 38 minutes.    Demetrios Loll M.D on 01/16/2016 at 11:09 AM  Between 7am to 6pm - Pager - [phone]  After 6pm go to [email or web address] - password [email or web address]  [facility]  Office  [phone]  CC: Primary care physician; Marden Noble, MD

## 2016-01-16 NOTE — Progress Notes (Signed)
Spoke with Janett Billow, [facility] rep at [phone], to notify of non-emergent EMS transport.  Auth notification reference given as [id number].   Service date range good from 01/16/16 - 04/15/16.   Gap exception requested to determine if services can be considered at an in-network level.

## 2016-01-16 NOTE — Discharge Instructions (Signed)
Heart healthy and ADA diet. Activity as tolerated. Wound VAC and wound care.

## 2016-01-16 NOTE — Progress Notes (Signed)
EMS here to transport pt. 

## 2016-01-16 NOTE — Clinical Social Work Placement (Signed)
   CLINICAL SOCIAL WORK PLACEMENT  NOTE  Date:  01/16/2016  Patient Details  Name: Beth Mcguire MRN: [id number] Date of Birth: May 10, 1941  Clinical Social Work is seeking post-discharge placement for this patient at the [place] level of care (*CSW will initial, date and re-position this form in  chart as items are completed):  Yes   Patient/family provided with [facility] Work Department's list of facilities offering this level of care within the geographic area requested by the patient (or if unable, by the patient's family).  Yes   Patient/family informed of their freedom to choose among providers that offer the needed level of care, that participate in Medicare, Medicaid or managed care program needed by the patient, have an available bed and are willing to accept the patient.  Yes   Patient/family informed of [facility]'s ownership interest in [facility] and [facility], as well as of the fact that they are under no obligation to receive care at these facilities.  PASRR submitted to EDS on       PASRR number received on       Existing PASRR number confirmed on 01/16/16     FL2 transmitted to all facilities in geographic area requested by pt/family on 01/16/16     FL2 transmitted to all facilities within larger geographic area on       Patient informed that his/her managed care company has contracts with or will negotiate with certain facilities, including the following:        Yes   Patient/family informed of bed offers received.  Patient chooses bed at  [facility] )     Physician recommends and patient chooses bed at      Patient to be transferred to  [facility]) on 01/16/16.  Patient to be transferred to facility by  [facility] )     Patient family notified on 01/16/16 of transfer.  Name of family member notified:   (Patient's Husband [name] is at bedside and aware of D/C today. )      PHYSICIAN       Additional Comment:    _______________________________________________ Loralyn Freshwater, LCSW 01/16/2016, 1:59 PM

## 2016-01-16 NOTE — Progress Notes (Signed)
DISCHARGE NOTE:  Report called to [place] at [facility]. EMS called for transportation.

## 2016-01-16 NOTE — Progress Notes (Signed)
Physical Therapy Treatment Patient Details Name: Beth Mcguire MRN: [id number] DOB: 15-Jul-1941 Today's Date: 01/16/2016    History of Present Illness 75 yo female with onset of R foot ulcer with amputation of first ray R great toe with wound vac.  Pt has history of DM, colon CA, HTN, anxiety.  MD has removed PT order after PT had evaluated pt.    PT Comments    Pt making minimal progress toward goals today, pain better controlled and able to participate in seated exercises. Pt is most limited in all activities by fatigue and limited activity tolerance with DOE, and requiring 60s rest between repeated transfers. Pt can state NWB status and is able to maintain during functional activity. Pt requires heavy verbal cues for pivot transfers standing using a hop-to gait and RW, and give early complaint if being tired and feeling weak. Will cont with POC as outlined in PT eval.   Follow Up Recommendations  SNF     Equipment Recommendations   (to be determined by receiving facility. )    Recommendations for Other Services       Precautions / Restrictions Precautions Precautions: None Restrictions Weight Bearing Restrictions: Yes RLE Weight Bearing: Non weight bearing    Mobility  Bed Mobility Overal bed mobility: Independent Bed Mobility: Supine to Sit              Transfers Overall transfer level: Needs assistance Equipment used: Rolling walker (2 wheeled) Transfers: Sit to/from [facility] Sit to Stand: Min guard Stand pivot transfers: Min guard       General transfer comment: STS requires maximal effort, asynchronous hip/trunk movement. Pt maintains NWB throughout.   Ambulation/Gait             General Gait Details: standing pivot only tolerated; too fatigued for additional gait training.    Stairs            Wheelchair Mobility    Modified Rankin (Stroke Patients Only)       Balance Overall balance assessment: Needs assistance                                   Cognition Arousal/Alertness: Awake/alert Behavior During Therapy: WFL for tasks assessed/performed Overall Cognitive Status: Within Functional Limits for tasks assessed                      Exercises General Exercises - Lower Extremity Long Arc Quad: Strengthening;Right;15 reps;Seated Hip Flexion/Marching: Strengthening;Right;15 reps;Seated    General Comments        Pertinent Vitals/Pain Pain Assessment: No/denies pain    Home Living                      Prior Function            PT Goals (current goals can now be found in the care plan section) Acute Rehab PT Goals Patient Stated Goal: to sit in chair PT Goal Formulation: With patient/family Time For Goal Achievement: 01/29/16 Potential to Achieve Goals: Good Progress towards PT goals: Progressing toward goals    Frequency  7X/week    PT Plan Current plan remains appropriate    Co-evaluation             End of Session Equipment Utilized During Treatment: Gait belt Activity Tolerance: Patient limited by fatigue Patient left: in chair;with call bell/phone within reach;with chair alarm  set;with family/visitor present     Time: 1012-1037 PT Time Calculation (min) (ACUTE ONLY): 25 min  Charges:  $Gait Training: 8-22 mins $Therapeutic Exercise: 8-22 mins                    G Codes:      11:44 AM, 29-Jan-2016 Etta Grandchild, PT, DPT PRN Physical Therapist - [place] License # [id number] [phone] (mobile)

## 2016-01-16 NOTE — Progress Notes (Signed)
LCSW called [facility] and left voice mail message that patient has accepted the bed offer for [facility]. LCSW met with patient and husband and several bed offers reviewed and they selected to [facility] other family lives there as well. LCSW collected information and will complete assessment.  [employer] LCSW [phone]

## 2016-01-16 NOTE — Progress Notes (Signed)
LCSW met with patient and husband  and reviewed that they have been accepted to [facility] and she will be in room 30A. LCSW completed documentation and prepared discharge envelope. LCSW gave nurse call report # and room number for patient who will be transported by EMS.  [employer] LCSW [phone]

## 2016-01-16 NOTE — NC FL2 (Signed)
[place] LEVEL OF CARE SCREENING TOOL     IDENTIFICATION  Patient Name: Beth Mcguire Birthdate: 08-Nov-1940 Sex: female Admission Date (Current Location): 01/12/2016  New Site and [place] Number:  Engineering geologist and Address:  [facility], [address], [place], [place] [address]      Provider Number: [id number]  Attending Physician Name and Address:  Demetrios Loll, MD  Relative Name and Phone Number:       Current Level of Care: SNF Recommended Level of Care: [place] Prior Approval Number:    Date Approved/Denied:   PASRR Number:  ([id number])  Discharge Plan: SNF    Current Diagnoses: Patient Active Problem List   Diagnosis Date Noted  . Foot osteomyelitis, right ([place]) 01/12/2016  . 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 )                       Personal Care Assistance Level of Assistance  Bathing, Feeding, Dressing Bathing Assistance: Limited assistance Feeding assistance: Independent Dressing Assistance: Limited assistance     Functional Limitations Info  Sight, Hearing, Speech Sight Info: Adequate Hearing Info: Adequate Speech Info: Adequate    SPECIAL CARE FACTORS FREQUENCY  PT (By licensed PT), OT (By licensed OT)     PT Frequency:  (5) OT Frequency:  (5)            Contractures      Additional Factors Info  Code Status, Allergies, Insulin Sliding Scale Code Status Info:  (Full Code. ) Allergies Info:  (No Known Allergies. )   Insulin Sliding Scale Info:  (NovoLog Insulin  Injections 3 times daily. )       Current Medications (01/16/2016):  This is the current hospital active medication list Current Facility-Administered Medications  Medication Dose Route Frequency Provider Last Rate Last Dose  . acetaminophen (TYLENOL) tablet 650 mg  650 mg Oral Q6H PRN Hillary Bow, MD   650 mg at 01/14/16 1152   Or  . acetaminophen (TYLENOL) suppository 650 mg  650 mg Rectal Q6H PRN Hillary Bow, MD      . amoxicillin-clavulanate (AUGMENTIN) 875-125 MG per tablet 1 tablet  1 tablet Oral Q12H Bettey Costa, MD   1 tablet at 01/16/16 0819  . bisacodyl (DULCOLAX) suppository 10 mg  10 mg Rectal Daily PRN Srikar Sudini, MD      . clopidogrel (PLAVIX) tablet 75 mg  75 mg Oral Daily Hillary Bow, MD   75 mg at 01/16/16 0819  . docusate sodium (COLACE) capsule 100 mg  100 mg Oral BID Hillary Bow, MD   100 mg at 01/16/16 0819  . enoxaparin (LOVENOX) injection 40 mg  40 mg Subcutaneous Q24H Hillary Bow, MD   40 mg at 01/15/16 1719  . feeding supplement (GLUCERNA SHAKE) (GLUCERNA SHAKE) liquid 237 mL  237 mL Oral Q24H Sital Mody, MD   237 mL at 01/15/16 1230  . HYDROcodone-acetaminophen (NORCO/VICODIN) 5-325 MG per tablet 1-2 tablet  1-2 tablet Oral Q4H PRN Hillary Bow, MD      .  insulin aspart (novoLOG) injection 0-15 Units  0-15 Units Subcutaneous TID WC Hillary Bow, MD   3 Units at 01/15/16 1719  . insulin aspart (novoLOG) injection 3 Units  3 Units Subcutaneous TID WC Bettey Costa, MD   3 Units at 01/16/16 0818  . insulin glargine (LANTUS) injection 15 Units  15 Units Subcutaneous QHS Hillary Bow, MD   15 Units at 01/15/16 2113  . lisinopril (PRINIVIL,ZESTRIL) tablet 20 mg  20 mg Oral Daily Hillary Bow, MD   20 mg at 01/16/16 0820  . polyethylene glycol (MIRALAX / GLYCOLAX) packet 17 g  17 g Oral Daily PRN Srikar Sudini, MD      . simvastatin (ZOCOR) tablet 20 mg  20 mg Oral Daily Hillary Bow, MD   20 mg at 01/16/16 0820     Discharge Medications: Please see  discharge summary for a list of discharge medications.  Relevant Imaging Results:  Relevant Lab Results:   Additional Information  (SSN: [id number])  Loralyn Freshwater, LCSW

## 2016-01-17 LAB — WOUND CULTURE

## 2016-01-17 LAB — SURGICAL PATHOLOGY

## 2016-01-18 LAB — ANAEROBIC CULTURE

## 2016-06-20 ENCOUNTER — Other Ambulatory Visit: Payer: Self-pay | Admitting: Vascular Surgery

## 2016-06-22 ENCOUNTER — Other Ambulatory Visit
Admission: RE | Admit: 2016-06-22 | Discharge: 2016-06-22 | Disposition: A | Discharge status: Home / Self Care | Payer: Medicare Other | Source: Ambulatory Visit | Attending: Vascular Surgery | Admitting: Vascular Surgery

## 2016-06-22 DIAGNOSIS — I10 Essential (primary) hypertension: Secondary | ICD-10-CM | POA: Diagnosis not present

## 2016-06-22 DIAGNOSIS — E785 Hyperlipidemia, unspecified: Secondary | ICD-10-CM | POA: Diagnosis not present

## 2016-06-22 DIAGNOSIS — Z79899 Other long term (current) drug therapy: Secondary | ICD-10-CM | POA: Diagnosis not present

## 2016-06-22 DIAGNOSIS — L97512 Non-pressure chronic ulcer of other part of right foot with fat layer exposed: Secondary | ICD-10-CM | POA: Diagnosis not present

## 2016-06-22 DIAGNOSIS — E7800 Pure hypercholesterolemia, unspecified: Secondary | ICD-10-CM | POA: Insufficient documentation

## 2016-06-22 DIAGNOSIS — Z85038 Personal history of other malignant neoplasm of large intestine: Secondary | ICD-10-CM | POA: Insufficient documentation

## 2016-06-22 DIAGNOSIS — Z87891 Personal history of nicotine dependence: Secondary | ICD-10-CM | POA: Insufficient documentation

## 2016-06-22 DIAGNOSIS — Z7982 Long term (current) use of aspirin: Secondary | ICD-10-CM | POA: Diagnosis not present

## 2016-06-22 DIAGNOSIS — I70235 Atherosclerosis of native arteries of right leg with ulceration of other part of foot: Secondary | ICD-10-CM | POA: Diagnosis present

## 2016-06-22 DIAGNOSIS — E119 Type 2 diabetes mellitus without complications: Secondary | ICD-10-CM | POA: Insufficient documentation

## 2016-06-22 DIAGNOSIS — I999 Unspecified disorder of circulatory system: Secondary | ICD-10-CM | POA: Diagnosis not present

## 2016-06-22 LAB — CREATININE, SERUM
CREATININE: 1.64 mg/dL — AB (ref 0.44–1.00)
GFR calc Af Amer: 34 mL/min — ABNORMAL LOW (ref >60)
GFR, EST NON AFRICAN AMERICAN: 29 mL/min — AB (ref >60)

## 2016-06-22 LAB — BUN: BUN: 24 mg/dL — AB (ref 6–20)

## 2016-06-25 ENCOUNTER — Ambulatory Visit
Admission: RE | Admit: 2016-06-25 | Discharge: 2016-06-25 | Disposition: A | Discharge status: Home / Self Care | Payer: Medicare Other | Source: Ambulatory Visit | Attending: Vascular Surgery | Admitting: Vascular Surgery

## 2016-06-25 ENCOUNTER — Encounter: Payer: Self-pay | Admitting: *Deleted

## 2016-06-25 ENCOUNTER — Encounter
Admission: RE | Disposition: A | Discharge status: Home / Self Care | Payer: Self-pay | Source: Ambulatory Visit | Attending: Vascular Surgery

## 2016-06-25 DIAGNOSIS — F419 Anxiety disorder, unspecified: Secondary | ICD-10-CM | POA: Insufficient documentation

## 2016-06-25 DIAGNOSIS — I10 Essential (primary) hypertension: Secondary | ICD-10-CM | POA: Diagnosis not present

## 2016-06-25 DIAGNOSIS — E119 Type 2 diabetes mellitus without complications: Secondary | ICD-10-CM | POA: Insufficient documentation

## 2016-06-25 DIAGNOSIS — L97501 Non-pressure chronic ulcer of other part of unspecified foot limited to breakdown of skin: Secondary | ICD-10-CM | POA: Diagnosis not present

## 2016-06-25 DIAGNOSIS — I70245 Atherosclerosis of native arteries of left leg with ulceration of other part of foot: Secondary | ICD-10-CM | POA: Insufficient documentation

## 2016-06-25 DIAGNOSIS — Z833 Family history of diabetes mellitus: Secondary | ICD-10-CM | POA: Diagnosis not present

## 2016-06-25 DIAGNOSIS — Z85038 Personal history of other malignant neoplasm of large intestine: Secondary | ICD-10-CM | POA: Insufficient documentation

## 2016-06-25 DIAGNOSIS — Z89421 Acquired absence of other right toe(s): Secondary | ICD-10-CM | POA: Insufficient documentation

## 2016-06-25 HISTORY — DX: Peripheral vascular disease, unspecified: I73.9

## 2016-06-25 HISTORY — PX: PERIPHERAL VASCULAR CATHETERIZATION: SHX172C

## 2016-06-25 LAB — GLUCOSE, CAPILLARY
GLUCOSE-CAPILLARY: 30 mg/dL — AB (ref 65–99)
GLUCOSE-CAPILLARY: 99 mg/dL (ref 65–99)
Glucose-Capillary: 105 mg/dL — ABNORMAL HIGH (ref 65–99)
Glucose-Capillary: 38 mg/dL — CL (ref 65–99)

## 2016-06-25 SURGERY — LOWER EXTREMITY ANGIOGRAPHY
Anesthesia: Moderate Sedation | Site: Leg Lower | Laterality: Right

## 2016-06-25 MED ORDER — DEXTROSE 5 % IV SOLN: 1.5000 g | INTRAVENOUS | Status: DC

## 2016-06-25 MED ORDER — HYDROMORPHONE HCL 1 MG/ML IJ SOLN: 0.5000 mg | INTRAMUSCULAR | Status: DC | PRN

## 2016-06-25 MED ORDER — HYDRALAZINE HCL 20 MG/ML IJ SOLN: 5.0000 mg | INTRAMUSCULAR | Status: DC | PRN

## 2016-06-25 MED ORDER — METHYLPREDNISOLONE SODIUM SUCC 125 MG IJ SOLR: 125.0000 mg | INTRAMUSCULAR | Status: DC | PRN

## 2016-06-25 MED ORDER — FENTANYL CITRATE (PF) 100 MCG/2ML IJ SOLN
INTRAMUSCULAR | Status: DC | PRN
  Administered 2016-06-25: 50 ug via INTRAVENOUS
  Administered 2016-06-25 (×2): 25 ug via INTRAVENOUS

## 2016-06-25 MED ORDER — OXYCODONE-ACETAMINOPHEN 5-325 MG PO TABS
1.0000 | ORAL_TABLET | ORAL | Status: DC | PRN
Start: 2016-06-25 — End: 2016-06-26

## 2016-06-25 MED ORDER — DEXTROSE 50 % IV SOLN
INTRAVENOUS | Status: AC
Start: 2016-06-25 — End: 2016-06-25
  Administered 2016-06-25: 50 mL via INTRAVENOUS
  Filled 2016-06-25: qty 50

## 2016-06-25 MED ORDER — HYDRALAZINE HCL 20 MG/ML IJ SOLN
INTRAMUSCULAR | Status: DC | PRN
  Administered 2016-06-25: 20 mg via INTRAVENOUS

## 2016-06-25 MED ORDER — GUAIFENESIN-DM 100-10 MG/5ML PO SYRP: 15.0000 mL | ORAL_SOLUTION | ORAL | Status: DC | PRN

## 2016-06-25 MED ORDER — MIDAZOLAM HCL 2 MG/2ML IJ SOLN
INTRAMUSCULAR | Status: DC | PRN
  Administered 2016-06-25 (×2): 1 mg via INTRAVENOUS
  Administered 2016-06-25: 2 mg via INTRAVENOUS

## 2016-06-25 MED ORDER — METOPROLOL TARTRATE 5 MG/5ML IV SOLN: 2.0000 mg | INTRAVENOUS | Status: DC | PRN

## 2016-06-25 MED ORDER — MIDAZOLAM HCL 5 MG/5ML IJ SOLN
INTRAMUSCULAR | Status: AC
  Filled 2016-06-25: qty 5

## 2016-06-25 MED ORDER — LIDOCAINE-EPINEPHRINE (PF) 1 %-1:200000 IJ SOLN
INTRAMUSCULAR | Status: AC
  Filled 2016-06-25: qty 30

## 2016-06-25 MED ORDER — ACETAMINOPHEN 325 MG PO TABS
ORAL_TABLET | ORAL | Status: AC
  Filled 2016-06-25: qty 2

## 2016-06-25 MED ORDER — SODIUM CHLORIDE 0.9 % IV SOLN
INTRAVENOUS | Status: DC
  Administered 2016-06-25: 15:00:00 via INTRAVENOUS

## 2016-06-25 MED ORDER — HYDRALAZINE HCL 20 MG/ML IJ SOLN
INTRAMUSCULAR | Status: AC
  Filled 2016-06-25: qty 1

## 2016-06-25 MED ORDER — ONDANSETRON HCL 4 MG/2ML IJ SOLN: 4.0000 mg | Freq: Four times a day (QID) | INTRAMUSCULAR | Status: DC | PRN

## 2016-06-25 MED ORDER — HEPARIN (PORCINE) IN NACL 2-0.9 UNIT/ML-% IJ SOLN
INTRAMUSCULAR | Status: AC
  Filled 2016-06-25: qty 1000

## 2016-06-25 MED ORDER — IOPAMIDOL (ISOVUE-300) INJECTION 61%
INTRAVENOUS | Status: DC | PRN
  Administered 2016-06-25: 75 mL via INTRAVENOUS

## 2016-06-25 MED ORDER — HEPARIN SODIUM (PORCINE) 1000 UNIT/ML IJ SOLN
INTRAMUSCULAR | Status: DC | PRN
  Administered 2016-06-25: 5000 [IU] via INTRAVENOUS

## 2016-06-25 MED ORDER — FENTANYL CITRATE (PF) 100 MCG/2ML IJ SOLN
INTRAMUSCULAR | Status: AC
  Filled 2016-06-25: qty 2

## 2016-06-25 MED ORDER — ACETAMINOPHEN 325 MG PO TABS: 325.0000 mg | ORAL_TABLET | ORAL | Status: DC | PRN

## 2016-06-25 MED ORDER — LABETALOL HCL 5 MG/ML IV SOLN: 10.0000 mg | INTRAVENOUS | Status: DC | PRN

## 2016-06-25 MED ORDER — ACETAMINOPHEN 325 MG RE SUPP
325.0000 mg | RECTAL | Status: DC | PRN
  Administered 2016-06-25: 650 mg via RECTAL
  Filled 2016-06-25 (×2): qty 2

## 2016-06-25 MED ORDER — PHENOL 1.4 % MT LIQD: 1.0000 | OROMUCOSAL | Status: DC | PRN

## 2016-06-25 MED ORDER — DEXTROSE 50 % IV SOLN
1.0000 | Freq: Once | INTRAVENOUS | Status: AC
  Administered 2016-06-25: 50 mL via INTRAVENOUS

## 2016-06-25 MED ORDER — HYDROMORPHONE HCL 1 MG/ML IJ SOLN: 1.0000 mg | Freq: Once | INTRAMUSCULAR | Status: DC

## 2016-06-25 MED ORDER — SODIUM CHLORIDE 0.9 % IV SOLN: 500.0000 mL | Freq: Once | INTRAVENOUS | Status: DC | PRN

## 2016-06-25 MED ORDER — FAMOTIDINE 20 MG PO TABS: 40.0000 mg | ORAL_TABLET | ORAL | Status: DC | PRN

## 2016-06-25 SURGICAL SUPPLY — 26 items
BALLN LUTONIX 5X120X130 (BALLOONS) ×4
BALLN LUTONIX 5X150X130 (BALLOONS) ×4
BALLN ULTRVRSE 3X300X130 (BALLOONS) ×4
BALLN ULTRVRSE 6X60X130C (BALLOONS) ×4
BALLOON LUTONIX 5X120X130 (BALLOONS) ×2 IMPLANT
BALLOON LUTONIX 5X150X130 (BALLOONS) ×2 IMPLANT
BALLOON ULTRVRSE 3X300X130 (BALLOONS) ×2 IMPLANT
BALLOON ULTRVRSE 6X60X130C (BALLOONS) ×2 IMPLANT
CATH CXI SUPP ANG 4FR 135 (MICROCATHETER) ×2 IMPLANT
CATH CXI SUPP ANG 4FR 135CM (MICROCATHETER) ×4
CATH PIG 70CM (CATHETERS) ×4 IMPLANT
CATH VERT 100CM (CATHETERS) ×4 IMPLANT
DEVICE PRESTO INFLATION (MISCELLANEOUS) ×4 IMPLANT
DEVICE SOLENT OMNI 120CM (CATHETERS) ×4 IMPLANT
DEVICE STARCLOSE SE CLOSURE (Vascular Products) ×4 IMPLANT
GLIDEWIRE ADV .035X260CM (WIRE) ×4 IMPLANT
PACK ANGIOGRAPHY (CUSTOM PROCEDURE TRAY) ×4 IMPLANT
SHEATH ANL2 6FRX45 HC (SHEATH) ×4 IMPLANT
SHEATH BRITE TIP 5FRX11 (SHEATH) ×4 IMPLANT
STENT TIGRIS 6X60X120 (Permanent Stent) ×4 IMPLANT
STENT VIABAHN 5X250X120 (Permanent Stent) ×4 IMPLANT
STENT VIABAHN 6X150X120 (Permanent Stent) ×4 IMPLANT
SYR MEDRAD MARK V 150ML (SYRINGE) ×4 IMPLANT
TUBING CONTRAST HIGH PRESS 72 (TUBING) ×4 IMPLANT
WIRE G V18X300CM (WIRE) ×4 IMPLANT
WIRE J 3MM .035X145CM (WIRE) ×4 IMPLANT

## 2016-06-25 NOTE — H&P (Signed)
[facility] Admission History & Physical  MRN : [id number]  Beth Mcguire is a 75 y.o. (Jul 08, 1941) female who presents with chief complaint of No chief complaint on file. 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 She was sent to the office by Dr. Elvina Mattes and noninvasive studies were performed last week. These demonstrate recurrent occlusion throughout the right lower extremity with her previous interventions no longer being patent and flat digital waveforms with poor flow distally.  No current facility-administered medications for this encounter.     Past Medical History:  Diagnosis Date  . Anxiety   . Colon cancer ([place])   . Diabetes ([place])   . Hypertension     Past Surgical History:  Procedure Laterality Date  . AMPUTATION Right 01/14/2016   Procedure: AMPUTATION RAY;  Surgeon: Albertine Patricia, DPM;  Location: [facility];  Service: Podiatry;  Laterality: Right;  . COLON SURGERY    . PERIPHERAL VASCULAR CATHETERIZATION N/A 01/05/2016   Procedure: Lower Extremity Angiography;  Surgeon: Algernon Huxley, MD;  Location: [facility];  Service: Cardiovascular;  Laterality: N/A;  . PERIPHERAL VASCULAR CATHETERIZATION  01/05/2016   Procedure: Lower Extremity Intervention;  Surgeon: Algernon Huxley, MD;  Location: [facility];  Service: Cardiovascular;;  . TONSILLECTOMY      Social History Social History  Substance Use Topics  . Smoking status: Never Smoker  . Smokeless tobacco: Not on file  . Alcohol use Not on file  lives with husband at home. No IVDU  Family History Family History  Problem Relation Age of Onset  . Diabetes Other   No bleeding disorders, clotting disorders, autoimmune diseases, or aneurysms  No Known Allergies   REVIEW OF SYSTEMS (Negative unless checked)  Constitutional: [] Weight loss  [] Fever  [] Chills Cardiac: [] Chest pain    [] Chest pressure   [] Palpitations   [] Shortness of breath when laying flat   [] Shortness of breath at rest   [x] Shortness of breath with exertion. Vascular:  [] Pain in legs with walking   [] Pain in legs at rest   [] Pain in legs when laying flat   [] Claudication   [] Pain in feet when walking  [] Pain in feet at rest  [] Pain in feet when laying flat   [] History of DVT   [] Phlebitis   [x] Swelling in legs   [] Varicose veins   [x] Non-healing ulcers Pulmonary:   [] Uses home oxygen   [] Productive cough   [] Hemoptysis   [] Wheeze  [] COPD   [] Asthma Neurologic:  [] Dizziness  [] Blackouts   [] Seizures   [] History of stroke   [] History of TIA  [] Aphasia   [] Temporary blindness   [] Dysphagia   [] Weakness or numbness in arms   [] Weakness or numbness in legs Musculoskeletal:  [] Arthritis   [] Joint swelling   [] Joint pain   [] Low back pain Hematologic:  [] Easy bruising  [] Easy bleeding   [] Hypercoagulable state   [] Anemic  [] Hepatitis Gastrointestinal:  [] Blood in stool   [] Vomiting blood  [] Gastroesophageal reflux/heartburn   [] Difficulty swallowing. Genitourinary:  [x] Chronic kidney disease   [] Difficult urination  [] Frequent urination  [] Burning with urination   [] Blood in urine Skin:  [] Rashes   [] Ulcers   [] Wounds Psychological:  [] History of anxiety   []  History of major depression.  Physical Examination  There were no vitals filed for this visit. There is no height or weight on file to calculate BMI. Gen: WD/WN, NAD  Head: [place]/AT, No temporalis wasting. Prominent temp pulse not noted. Ear/Nose/Throat: Hearing grossly intact, nares w/o erythema or drainage, oropharynx w/o Erythema/Exudate,  Eyes: PERRLA, EOMI.  Neck: Supple, no nuchal rigidity.  No JVD.  Pulmonary:  Good air movement,  no use of accessory muscles.  Cardiac: RRR, normal S1, S2. Vascular:  Vessel Right Left  Radial Palpable Palpable  Ulnar Palpable Palpable  Brachial Palpable Palpable  Carotid Palpable, without bruit Palpable, without  bruit  Aorta Not palpable N/A  Femoral Palpable Palpable  Popliteal Not Palpable Not Palpable  PT Not Palpable Not Palpable  DP Not Palpable Palpable   Gastrointestinal: soft, non-tender/non-distended. No guarding/reflex.  Musculoskeletal: M/S 5/5 throughout.  No deformity or atrophy.  Neurologic: CN 2-12 intact. Pain and light touch intact in extremities.  Symmetrical.  Speech is fluent. Motor exam as listed above. Psychiatric: Judgment intact, Mood & affect appropriate for pt's clinical situation. Dermatologic: open wound on the right foot with mild surrounding erythema. Lymph : No Cervical, Axillary, or Inguinal lymphadenopathy.     CBC Lab Results  Component Value Date   WBC 11.4 (H) 01/14/2016   HGB 10.2 (L) 01/14/2016   HCT 31.3 (L) 01/14/2016   MCV 80.6 01/14/2016   PLT 595 (H) 01/14/2016    BMET    Component Value Date/Time   NA 135 01/14/2016 0413   NA 142 11/13/2011 1110   K 4.0 01/14/2016 0413   K 4.6 11/13/2011 1110   CL 107 01/14/2016 0413   CL 106 11/13/2011 1110   CO2 23 01/14/2016 0413   CO2 28 11/13/2011 1110   GLUCOSE 190 (H) 01/14/2016 0413   GLUCOSE 123 (H) 11/13/2011 1110   BUN 24 (H) 06/22/2016 1231   BUN 12 11/13/2011 1110   CREATININE 1.64 (H) 06/22/2016 1231   CREATININE 1.10 12/04/2013 0953   CALCIUM 9.0 01/14/2016 0413   CALCIUM 8.7 11/13/2011 1110   GFRNONAA 29 (L) 06/22/2016 1231   GFRNONAA 49 (L) 12/04/2013 0953   GFRAA 34 (L) 06/22/2016 1231   GFRAA 57 (L) 12/04/2013 0953   CrCl cannot be calculated (Unknown ideal weight.).  COAG No results found for: INR, PROTIME  Radiology No results found.   Assessment/Plan 1. PAD with ulceration right lower extremity. Recurrent occlusion of her previous interventions with very poor flow distally. This is a critically limb threatening situation and requires intervention or she is likely to lose her leg. Risks and benefits are discussed. 2. Diabetes. Stable on outpatient medications 3.  Hypertension. Stable on outpatient medications   DEW,JASON, MD  06/25/2016 1:04 PM

## 2016-06-25 NOTE — Discharge Instructions (Signed)

## 2016-06-25 NOTE — Progress Notes (Signed)
Pt remains clinically stable, no bleeding nor hematoma at groin site, discharge instructions given with questions answered.

## 2016-06-25 NOTE — Op Note (Signed)
[facility] Percutaneous Study/Intervention Procedural Note   Date of Surgery: 06/25/2016  Surgeon(s):Jersee Winiarski   Assistants:none  Pre-operative Diagnosis: PAD with nonhealing ulceration right lower extremity  Post-operative diagnosis: Same  Procedure(s) Performed: 1. Ultrasound guidance for vascular access left femoral artery 2. Catheter placement into right peroneal artery from left femoral approach 3. Aortogram and selective right lower extremity angiogram 4. Catheter directed thrombolytic therapy delivered with the AngioJet Omni catheter to the right SFA, popliteal artery, tibioperoneal trunk, and peroneal arteries 5. Mechanical rheolytic thrombectomy with the AngioJet Omni catheter to the right SFA, popliteal artery, tibioperoneal trunk, and peroneal arteries  6.  Percutaneous transluminal angioplasty of the right peroneal artery and tibioperoneal trunk with 3 mm diameter by 30 cm length angioplasty balloon  7.  Percutaneous transluminal angioplasty of the right popliteal artery and superficial femoral artery with 5 mm diameter angioplasty balloons including drug-coated angioplasty balloons proximally and distally  8. Stent placement 3 to the right SFA and popliteal arteries using a 5 mm diameter by 25 cm length Viabahn distally, 6 mm diameter by 15 cm length Viabahn in the proximal to mid SFA, and a 6 mm diameter by 6 cm length Tigris stent to the proximal SFA. All for residual thrombosis, dissection, and stenosis after above procedures  9. StarClose closure device left  femoral artery  EBL: Minimal   Contrast: 75  cc  Fluoro Time: 11  minutes  Moderate Conscious Sedation Time: approximately 70  minutes using 3  mg of Versed and 100  mcg of Fentanyl  Indications: Patient is a 75 y.o.female with worsening ulceration of the right foot. The patient has noninvasive  study showing occlusion of her previous interventions on duplex with flat waveforms distally. The patient is brought in for angiography for further evaluation and potential treatment. Risks and benefits are discussed and informed consent is obtained  Procedure: The patient was identified and appropriate procedural time out was performed. The patient was then placed supine on the table and prepped and draped in the usual sterile fashion.Moderate conscious sedation was administered during a face to face encounter with the patient throughout the procedure with my supervision of the RN administering medicines and monitoring the patient's vital signs, pulse oximetry, telemetry and mental status throughout from the start of the procedure until the patient was taken to the recovery room. Ultrasound was used to evaluate the left  common femoral artery. It was patent . A digital ultrasound image was acquired. A Seldinger needle was used to access the left  common femoral artery under direct ultrasound guidance and a permanent image was performed. A 0.035 J wire was advanced without resistance and a 5Fr sheath was placed. Pigtail catheter was placed into the aorta and an AP aortogram was performed. This demonstrated normal renal arteries and normal aorta and iliac segments without significant stenosis. I then crossed the aortic bifurcation and advanced to the right  femoral head. Selective right  lower extremity angiogram was then performed. This demonstrated a flush occlusion of the superficial femoral artery with reconstitution of the popliteal artery just above the knee that had areas of stenosis at just below the knee. The only distal runoff was through the peroneal artery and the tibioperoneal trunk and proximal peroneal artery had what appeared to be a moderate to high-grade stenosis. The patient was systemically heparinized and a 6 [place] Ansell sheath was then placed over the [employer] wire. I then  used a Kumpe catheter and the advantage wire to  navigate through the SFA and down into the peroneal artery. The wire actually advanced through the SFA without resistance and without catheter support consistent with acute on chronic thrombosis which would be likely from the recent worsening of her ulceration and the relatively recent intervention. For this reason, I elected to instill TPA. 8 mg of TPA were delivered at a power pulse spray fashion from the origin of the SFA throughout the entire SFA and popliteal arteries down into the tibioperoneal trunk and proximal peroneal arteries. This was allowed to dwell and proceeded with angioplasty. A 3 mm diameter by 30 cm length angioplasty balloon was inflated from the mid peroneal artery up through the tibioperoneal trunk and back into the popliteal artery. This was inflated to 8 atm for 1 minute. I used this balloon to predilate the SFA as well. A 5 mm diameter by 15 cm length Lutonix drug-coated angioplasty balloon was then brought down into the popliteal artery and inflated to 8 atm for 1 minute. It was pulled back into further inflations were used to treat the distal SFA and then the mid SFA with each inflations being to 10 atm. A 5 mm diameter by 12 cm length Lutonix drug-coated balloon was then used to treat the proximal SFA. This was inflated to 10 atm for 1 minute as well. Following this, there was a dissection with significant thrombosis in the proximal SFA as well as thrombus in the distal SFA and popliteal artery with near occlusive stenosis and thrombus residual in the popliteal artery just below the knee. The tibioperoneal trunk and peroneal arteries had marked improvement in the stenosis but the thrombus from the popliteal artery went down into the tibioperoneal trunk and proximal peroneal arteries. I then performed mechanical rheolytic thrombectomy with the AngioJet Omni catheter to the right SFA, popliteal artery, tibioperoneal trunk, and proximal  peroneal artery. The 3 mm balloon was used to treat the peroneal artery and tibioperoneal trunk with good result and the tibial vessels, but significant residual stenosis and thrombosis remained throughout the SFA and popliteal arteries. I exchanged for a 0.018 wire and began placing stents. From the distal popliteal artery just above the tibioperoneal trunk up to the distal SFA a 5 mm diameter by 25 cm length Viabahn stent was deployed. A 6 mm diameter by 15 cm length Viabahn stent was then taken from the edge of the stent up into the proximal SFA about 5 cm beyond the origin. These vessel postdilated with a 5 mm balloon and an additional inflation with a 5 mm balloon was performed in the proximal SFA as well. The stents were now widely patent with good distal runoff still seen to the tibioperoneal trunk and peroneal arteries, but the proximal SFA had a continued significant dissection which was flow-limiting. I then exchanged for a 0.035 wire and elected to place a Tigris stent up to near the origin of the SFA. A 6 mm diameter by 6 cm length stent was deployed with slight overlap into the more proximal Viabahn stent as well. This was deployed to ensure that we had not harm the profunda origin and we were about 1/2 cm below the bifurcation. This was postdilated with a 6 mm balloon. There was still some dissection in the common femoral artery, but there was no longer flow limitation and there was now brisk flow through the SFA stents. I elected to terminate the procedure. The sheath was removed and StarClose closure device was deployed in the left femoral artery with excellent  hemostatic result. The patient was taken to the recovery room in stable condition having tolerated the procedure well.  Findings:  Aortogram: normal renal arteries and normal aorta and iliac segments without significant stenosis Right Lower Extremity: flush occlusion of the superficial femoral artery with  reconstitution of the popliteal artery just above the knee that had areas of stenosis at just below the knee. The only distal runoff was through the peroneal artery and the tibioperoneal trunk and proximal peroneal artery had what appeared to be a moderate to high-grade stenosis.   Disposition: Patient was taken to the recovery room in stable condition having tolerated the procedure well.  Complications: None  Madsen Riddle 06/25/2016 5:02 PM

## 2016-06-25 NOTE — Progress Notes (Signed)
Pt post procedure,clinically stable, family at bedside, Dr Lucky Cowboy out to speak with patient and family, 3 stents placed with procedure, daughter stating that mother quit taking plavix due to nosebleed occasional, I and Dr Lucky Cowboy explained significant importance of pt taking plavix in presence of her having several stents placed and importance of plavix helping to keep stents all functioning. Vss, no bleeding nor hematoma at left groin,

## 2016-06-27 ENCOUNTER — Encounter: Payer: Self-pay | Admitting: Vascular Surgery

## 2016-07-20 ENCOUNTER — Encounter (INDEPENDENT_AMBULATORY_CARE_PROVIDER_SITE_OTHER): Payer: Self-pay

## 2016-07-20 ENCOUNTER — Ambulatory Visit (INDEPENDENT_AMBULATORY_CARE_PROVIDER_SITE_OTHER): Payer: Self-pay | Admitting: Vascular Surgery

## 2016-07-24 ENCOUNTER — Ambulatory Visit (INDEPENDENT_AMBULATORY_CARE_PROVIDER_SITE_OTHER): Payer: Medicare Other

## 2016-07-24 ENCOUNTER — Other Ambulatory Visit (INDEPENDENT_AMBULATORY_CARE_PROVIDER_SITE_OTHER): Payer: Self-pay | Admitting: Vascular Surgery

## 2016-07-24 ENCOUNTER — Encounter (INDEPENDENT_AMBULATORY_CARE_PROVIDER_SITE_OTHER): Payer: Self-pay | Admitting: Vascular Surgery

## 2016-07-24 ENCOUNTER — Ambulatory Visit (INDEPENDENT_AMBULATORY_CARE_PROVIDER_SITE_OTHER): Payer: Medicare Other | Admitting: Vascular Surgery

## 2016-07-24 VITALS — BP 159/62 | HR 67 | Resp 16 | Ht 62.0 in | Wt 154.0 lb

## 2016-07-24 DIAGNOSIS — L97909 Non-pressure chronic ulcer of unspecified part of unspecified lower leg with unspecified severity: Secondary | ICD-10-CM

## 2016-07-24 DIAGNOSIS — E1151 Type 2 diabetes mellitus with diabetic peripheral angiopathy without gangrene: Secondary | ICD-10-CM

## 2016-07-24 DIAGNOSIS — Z794 Long term (current) use of insulin: Secondary | ICD-10-CM | POA: Diagnosis not present

## 2016-07-24 DIAGNOSIS — I10 Essential (primary) hypertension: Secondary | ICD-10-CM | POA: Diagnosis not present

## 2016-07-24 DIAGNOSIS — E785 Hyperlipidemia, unspecified: Secondary | ICD-10-CM

## 2016-07-24 DIAGNOSIS — I739 Peripheral vascular disease, unspecified: Secondary | ICD-10-CM

## 2016-07-24 DIAGNOSIS — I7025 Atherosclerosis of native arteries of other extremities with ulceration: Secondary | ICD-10-CM

## 2016-07-24 DIAGNOSIS — L97512 Non-pressure chronic ulcer of other part of right foot with fat layer exposed: Secondary | ICD-10-CM

## 2016-07-24 DIAGNOSIS — I70299 Other atherosclerosis of native arteries of extremities, unspecified extremity: Secondary | ICD-10-CM

## 2016-07-25 DIAGNOSIS — E119 Type 2 diabetes mellitus without complications: Secondary | ICD-10-CM | POA: Insufficient documentation

## 2016-07-25 DIAGNOSIS — I10 Essential (primary) hypertension: Secondary | ICD-10-CM | POA: Insufficient documentation

## 2016-07-25 DIAGNOSIS — E785 Hyperlipidemia, unspecified: Secondary | ICD-10-CM | POA: Insufficient documentation

## 2016-07-25 DIAGNOSIS — E1122 Type 2 diabetes mellitus with diabetic chronic kidney disease: Secondary | ICD-10-CM | POA: Insufficient documentation

## 2016-07-25 DIAGNOSIS — I7025 Atherosclerosis of native arteries of other extremities with ulceration: Secondary | ICD-10-CM | POA: Insufficient documentation

## 2016-07-25 NOTE — Assessment & Plan Note (Signed)
lipid control important in reducing the progression of atherosclerotic disease. Continue statin therapy  

## 2016-07-25 NOTE — Progress Notes (Signed)
MRN : [id number]  Beth Mcguire is a 75 y.o. (July 24, 1941) female who presents with chief complaint of  Chief Complaint  Patient presents with  . Follow-up  .  History of Present Illness: Patient returns today in follow up of Her peripheral arterial disease and ulceration on the right foot. Her ulceration is improving and is under the care of her podiatrist who is doing an excellent job with wound care. She has a negative pressure dressing on today. Her noninvasive studies showed noncompressible vessels but excellent digital waveforms and decent digital pressures bilaterally consistently with markedly improved flow from her levels in the right leg prior to her procedure last month. She had no periprocedural complications and her access site is well-healed.  Current Outpatient Prescriptions  Medication Sig Dispense Refill  . aspirin 81 MG tablet Take 1 tablet (81 mg total) by mouth daily.    . bisacodyl (DULCOLAX) 10 MG suppository Place 1 suppository (10 mg total) rectally daily as needed for moderate constipation. 12 suppository 0  . Blood Glucose Monitoring Suppl (ONE TOUCH ULTRA MINI) w/Device KIT     . clopidogrel (PLAVIX) 75 MG tablet Take 1 tablet (75 mg total) by mouth daily. 90 tablet 0  . docusate sodium (COLACE) 100 MG capsule Take 1 capsule (100 mg total) by mouth 2 (two) times daily. 10 capsule 0  . doxycycline (VIBRA-TABS) 100 MG tablet Take by mouth.    Marland Kitchen glucose monitoring kit (FREESTYLE) monitoring kit 1 each by Does not apply route 4 (four) times daily -  before meals and at bedtime. 1 each 0  . HYDROcodone-acetaminophen (NORCO/VICODIN) 5-325 MG tablet Take 1 tablet by mouth every 6 (six) hours as needed for moderate pain. 15 tablet 0  . insulin aspart (NOVOLOG) 100 UNIT/ML injection Inject 0-15 Units into the skin 3 (three) times daily with meals. 10 mL 11  . insulin aspart (NOVOLOG) 100 UNIT/ML injection Inject 3 Units into the skin 3 (three) times daily with meals. 10 mL  11  . lisinopril (PRINIVIL,ZESTRIL) 20 MG tablet Take 20 mg by mouth daily.    . ONE TOUCH ULTRA TEST test strip     . polyethylene glycol (MIRALAX / GLYCOLAX) packet Take 17 g by mouth daily as needed for mild constipation. 14 each 0  . simvastatin (ZOCOR) 20 MG tablet Take 20 mg by mouth daily.    Marland Kitchen amoxicillin-clavulanate (AUGMENTIN) 875-125 MG tablet Take 1 tablet by mouth 2 (two) times daily. (Patient not taking: Reported on 07/24/2016) 14 tablet 0  . insulin glargine (LANTUS) 100 UNIT/ML injection Inject 0.15 mLs (15 Units total) into the skin at bedtime. (Patient not taking: Reported on 07/24/2016) 10 mL 11   No current facility-administered medications for this visit.     Past Medical History:  Diagnosis Date  . Anxiety   . Colon cancer ([place])   . Diabetes ([place])   . Hypertension   . Peripheral vascular disease [facility])     Past Surgical History:  Procedure Laterality Date  . AMPUTATION Right 01/14/2016   Procedure: AMPUTATION RAY;  Surgeon: Albertine Patricia, DPM;  Location: [facility] ORS;  Service: Podiatry;  Laterality: Right;  . COLON SURGERY    . PERIPHERAL VASCULAR CATHETERIZATION N/A 01/05/2016   Procedure: Lower Extremity Angiography;  Surgeon: Algernon Huxley, MD;  Location: [place] [facility];  Service: Cardiovascular;  Laterality: N/A;  . PERIPHERAL VASCULAR CATHETERIZATION  01/05/2016   Procedure: Lower Extremity Intervention;  Surgeon: Algernon Huxley, MD;  Location:  [place] [facility];  Service: Cardiovascular;;  . PERIPHERAL VASCULAR CATHETERIZATION Right 06/25/2016   Procedure: Lower Extremity Angiography;  Surgeon: Algernon Huxley, MD;  Location: [place] [facility];  Service: Cardiovascular;  Laterality: Right;  . PERIPHERAL VASCULAR CATHETERIZATION  06/25/2016   Procedure: Lower Extremity Intervention;  Surgeon: Algernon Huxley, MD;  Location: [place] [facility];  Service: Cardiovascular;;  . TONSILLECTOMY      Social History Social History  Substance Use Topics  . Smoking  status: Never Smoker  . Smokeless tobacco: Not on file  . Alcohol use Not on file    Married. Lives with spouse  Family History Family History  Problem Relation Age of Onset  . Diabetes Other     No bleeding or clotting disorders  No Known Allergies   REVIEW OF SYSTEMS (Negative unless checked)  Constitutional: '[]' Weight loss  '[]' Fever  '[]' Chills Cardiac: '[]' Chest pain   '[]' Chest pressure   '[]' Palpitations   '[]' Shortness of breath when laying flat   '[]' Shortness of breath at rest   '[]' Shortness of breath with exertion. Vascular:  '[]' Pain in legs with walking   '[]' Pain in legs at rest   '[]' Pain in legs when laying flat   '[]' Claudication   '[]' Pain in feet when walking  '[x]' Pain in feet at rest  '[]' Pain in feet when laying flat   '[]' History of DVT   '[]' Phlebitis   '[]' Swelling in legs   '[]' Varicose veins   '[x]' Non-healing ulcers Pulmonary:   '[]' Uses home oxygen   '[]' Productive cough   '[]' Hemoptysis   '[]' Wheeze  '[]' COPD   '[]' Asthma Neurologic:  '[]' Dizziness  '[]' Blackouts   '[]' Seizures   '[]' History of stroke   '[]' History of TIA  '[]' Aphasia   '[]' Temporary blindness   '[]' Dysphagia   '[]' Weakness or numbness in arms   '[]' Weakness or numbness in legs Musculoskeletal:  '[]' Arthritis   '[]' Joint swelling   '[]' Joint pain   '[]' Low back pain Hematologic:  '[]' Easy bruising  '[]' Easy bleeding   '[]' Hypercoagulable state   '[]' Anemic   Gastrointestinal:  '[]' Blood in stool   '[]' Vomiting blood  '[]' Gastroesophageal reflux/heartburn   '[]' Abdominal pain Genitourinary:  '[]' Chronic kidney disease   '[]' Difficult urination  '[]' Frequent urination  '[]' Burning with urination   '[]' Hematuria Skin:  '[]' Rashes   '[x]' Ulcers   '[x]' Wounds Psychological:  '[]' History of anxiety   '[]'  History of major depression.  Physical Examination  BP (!) 159/62 (BP Location: Right Arm)   Pulse 67   Resp 16   Ht '5\' 2"'  (1.575 m)   Wt 69.9 kg (154 lb)   BMI 28.17 kg/m  Gen:  WD/WN, NAD Head: [place]/AT, No temporalis wasting. Ear/Nose/Throat: Hearing grossly intact, nares w/o erythema or  drainage, trachea midline Eyes: PERRLA, EOMI. Sclera non-icteric Neck: Supple, no nuchal rigidity.  No JVD.  Pulmonary:  Good air movement, no use of accessory muscles.  Cardiac: RRR, normal S1, S2 Vascular:  Vessel Right Left  Radial Palpable Palpable  Ulnar Palpable Palpable  Brachial Palpable Palpable  Carotid Palpable, without bruit Palpable, without bruit  Aorta Not palpable N/A  Femoral Palpable Palpable  Popliteal Palpable Palpable  PT 1+ Palpable Palpable  DP trace Palpable Not Palpable   Gastrointestinal: soft, non-tender/non-distended. No guarding/reflex.  Musculoskeletal: M/S 5/5 throughout.  No deformity or atrophy. 1+ RLE edema. VAC on right foot Neurologic:  Symmetrical.  Speech is fluent.  Psychiatric: Judgment intact, Mood & affect appropriate for pt's clinical situation. Dermatologic: open wound right foot with dressing in place Lymph : No Cervical, Axillary, or Inguinal  lymphadenopathy.      Labs Recent Results (from the past 2160 hour(s))  BUN     Status: Abnormal   Collection Time: 06/22/16 12:31 PM  Result Value Ref Range   BUN 24 (H) 6 - 20 mg/dL  Creatinine, serum     Status: Abnormal   Collection Time: 06/22/16 12:31 PM  Result Value Ref Range   Creatinine, Ser 1.64 (H) 0.44 - 1.00 mg/dL   GFR calc non Af Amer 29 (L) >60 mL/min   GFR calc Af Amer 34 (L) >60 mL/min    Comment: (NOTE) The eGFR has been calculated using the CKD EPI equation. This calculation has not been validated in all clinical situations. eGFR's persistently <60 mL/min signify possible Chronic Kidney Disease.   Glucose, capillary     Status: Abnormal   Collection Time: 06/25/16  2:30 PM  Result Value Ref Range   Glucose-Capillary 30 (LL) 65 - 99 mg/dL   Comment 1 Repeat Test   Glucose, capillary     Status: Abnormal   Collection Time: 06/25/16  2:31 PM  Result Value Ref Range   Glucose-Capillary 38 (LL) 65 - 99 mg/dL   Comment 1 Call MD NNP PA CNM   Glucose, capillary      Status: Abnormal   Collection Time: 06/25/16  2:52 PM  Result Value Ref Range   Glucose-Capillary 105 (H) 65 - 99 mg/dL  Glucose, capillary     Status: None   Collection Time: 06/25/16  5:14 PM  Result Value Ref Range   Glucose-Capillary 99 65 - 99 mg/dL    Radiology No results found.   Assessment/Plan  Diabetes (HCC) blood glucose control important in reducing the progression of atherosclerotic disease. Also, involved in wound healing. On appropriate medications.   Essential hypertension blood pressure control important in reducing the progression of atherosclerotic disease. On appropriate oral medications.   Hyperlipidemia lipid control important in reducing the progression of atherosclerotic disease. Continue statin therapy   Atherosclerosis of native arteries of the extremities with ulceration ([place]) The patient has undergone 2 previous revascularization procedures to improve the perfusion to her foot. Her wound is improving. Her toe amputation site is largely healed. Although her noninvasive studies today demonstrate noncompressible vessels, her digital waveforms were excellent consistent with reasonably good flow to her feet bilaterally. We will plan to see her back in 3-4 months with noninvasive studies due to the high-risk nature of this patient who is required previous intervention twice and still has an active ulceration. She will contact our office with any problems in the interim.    Leotis Pain, MD  07/25/2016 2:45 PM    This note was created with Dragon medical transcription system.  Any errors from dictation are purely unintentional

## 2016-07-25 NOTE — Assessment & Plan Note (Signed)
blood glucose control important in reducing the progression of atherosclerotic disease. Also, involved in wound healing. On appropriate medications.  

## 2016-07-25 NOTE — Assessment & Plan Note (Signed)
blood pressure control important in reducing the progression of atherosclerotic disease. On appropriate oral medications.  

## 2016-07-25 NOTE — Assessment & Plan Note (Signed)
The patient has undergone 2 previous revascularization procedures to improve the perfusion to her foot. Her wound is improving. Her toe amputation site is largely healed. Although her noninvasive studies today demonstrate noncompressible vessels, her digital waveforms were excellent consistent with reasonably good flow to her feet bilaterally. We will plan to see her back in 3-4 months with noninvasive studies due to the high-risk nature of this patient who is required previous intervention twice and still has an active ulceration. She will contact our office with any problems in the interim.

## 2016-07-30 ENCOUNTER — Telehealth (INDEPENDENT_AMBULATORY_CARE_PROVIDER_SITE_OTHER): Payer: Self-pay | Admitting: Vascular Surgery

## 2016-07-30 NOTE — Telephone Encounter (Signed)
Called patient asked which pharmacy would she prefer her refill of 75mg  Plavix to go to? She stated [facility] on [address], calling it in now.  EMT, CMA

## 2016-07-30 NOTE — Telephone Encounter (Signed)
Mother needs Clopidogrel 75mg  called into the pharmacy she is out of refills.

## 2016-10-19 ENCOUNTER — Ambulatory Visit (INDEPENDENT_AMBULATORY_CARE_PROVIDER_SITE_OTHER): Payer: Medicare Other

## 2016-10-19 ENCOUNTER — Ambulatory Visit (INDEPENDENT_AMBULATORY_CARE_PROVIDER_SITE_OTHER): Payer: Medicare Other | Admitting: Vascular Surgery

## 2016-10-19 ENCOUNTER — Encounter (INDEPENDENT_AMBULATORY_CARE_PROVIDER_SITE_OTHER): Payer: Self-pay | Admitting: Vascular Surgery

## 2016-10-19 VITALS — BP 181/67 | HR 70 | Resp 16 | Wt 165.0 lb

## 2016-10-19 DIAGNOSIS — I7025 Atherosclerosis of native arteries of other extremities with ulceration: Secondary | ICD-10-CM

## 2016-10-19 DIAGNOSIS — L97513 Non-pressure chronic ulcer of other part of right foot with necrosis of muscle: Secondary | ICD-10-CM

## 2016-10-19 DIAGNOSIS — E11621 Type 2 diabetes mellitus with foot ulcer: Secondary | ICD-10-CM | POA: Diagnosis not present

## 2016-10-19 DIAGNOSIS — E1151 Type 2 diabetes mellitus with diabetic peripheral angiopathy without gangrene: Secondary | ICD-10-CM | POA: Diagnosis not present

## 2016-10-19 DIAGNOSIS — Z794 Long term (current) use of insulin: Secondary | ICD-10-CM

## 2016-10-19 NOTE — Progress Notes (Signed)
Subjective:    Patient ID: Beth Mcguire, female    DOB: March 26, 1941, 76 y.o.   MRN: [id number] Chief Complaint  Patient presents with  . Follow-up   Patient presents for a three month PAD follow up. She is s/p RLE endovascular intervention on 06/25/16 for a right foot ulceration. The patient presents today without complaint. States her right foot wound is now healed. The patient denies any claudication like symptoms, rest pain or ulcers to her feet / toes. The patient underwent an ABI which showed Right ABI: 0.75 and Left 1.00 (on 07/24/16, Right ABI: 1.05 and Left >1.3). A right lower extremity arterial duplex is notable for biphasic flow distally, >50% stenosis in the SFA / stent, >75% stenosis in the TP trunk and >50% stenosis in the common femoral artery.   Review of Systems  Constitutional: Negative.   HENT: Negative.   Eyes: Negative.   Respiratory: Negative.   Cardiovascular: Negative.   Gastrointestinal: Negative.   Endocrine: Negative.   Genitourinary: Negative.   Musculoskeletal: Negative.   Skin: Negative.   Allergic/Immunologic: Negative.   Neurological: Negative.   Hematological: Negative.   Psychiatric/Behavioral: Negative.       Objective:   Physical Exam  Constitutional: She is oriented to person, place, and time. She appears well-developed and well-nourished.  HENT:  Head: Normocephalic and atraumatic.  Right Ear: External ear normal.  Left Ear: External ear normal.  Eyes: Conjunctivae are normal. Pupils are equal, round, and reactive to light.  Neck: Normal range of motion.  Cardiovascular: Normal rate, regular rhythm, normal heart sounds and intact distal pulses.   Pulses:      Radial pulses are 2+ on the right side, and 2+ on the left side.       Dorsalis pedis pulses are 1+ on the right side, and 2+ on the left side.       Posterior tibial pulses are 1+ on the right side, and 2+ on the left side.  Pulmonary/Chest: Effort normal and breath sounds normal.   Abdominal: Bowel sounds are normal.  Musculoskeletal: Normal range of motion. She exhibits edema (Mild Bilateral Edema).  Neurological: She is alert and oriented to person, place, and time.  Skin: Skin is warm and dry. No rash noted. No erythema.  Psychiatric: She has a normal mood and affect. Her behavior is normal. Judgment and thought content normal.   BP (!) 181/67   Pulse 70   Resp 16   Wt 165 lb (74.8 kg)   BMI 30.18 kg/m   Past Medical History:  Diagnosis Date  . Anxiety   . Colon cancer ([place])   . Diabetes ([place])   . Hypertension   . Peripheral vascular disease [facility])     Social History   Social History  . Marital status: Married    Spouse name: N/A  . Number of children: N/A  . Years of education: N/A   Occupational History  . Not on file.   Social History Main Topics  . Smoking status: Never Smoker  . Smokeless tobacco: Not on file  . Alcohol use Not on file  . Drug use: Unknown  . Sexual activity: Not Currently   Other Topics Concern  . Not on file   Social History Narrative  . No narrative on file    Past Surgical History:  Procedure Laterality Date  . AMPUTATION Right 01/14/2016   Procedure: AMPUTATION RAY;  Surgeon: Albertine Patricia, DPM;  Location: [facility] ORS;  Service: Podiatry;  Laterality: Right;  . COLON SURGERY    . PERIPHERAL VASCULAR CATHETERIZATION N/A 01/05/2016   Procedure: Lower Extremity Angiography;  Surgeon: Algernon Huxley, MD;  Location: [place] [facility];  Service: Cardiovascular;  Laterality: N/A;  . PERIPHERAL VASCULAR CATHETERIZATION  01/05/2016   Procedure: Lower Extremity Intervention;  Surgeon: Algernon Huxley, MD;  Location: [place] [facility];  Service: Cardiovascular;;  . PERIPHERAL VASCULAR CATHETERIZATION Right 06/25/2016   Procedure: Lower Extremity Angiography;  Surgeon: Algernon Huxley, MD;  Location: [place] [facility];  Service: Cardiovascular;  Laterality: Right;  . PERIPHERAL VASCULAR CATHETERIZATION  06/25/2016    Procedure: Lower Extremity Intervention;  Surgeon: Algernon Huxley, MD;  Location: [place] [facility];  Service: Cardiovascular;;  . TONSILLECTOMY      Family History  Problem Relation Age of Onset  . Diabetes Other     No Known Allergies     Assessment & Plan:  Patient presents for a three month PAD follow up. She is s/p RLE endovascular intervention on 06/25/16 for a right foot ulceration. The patient presents today without complaint. States her right foot wound is now healed. The patient denies any claudication like symptoms, rest pain or ulcers to her feet / toes. The patient underwent an ABI which showed Right ABI: 0.75 and Left 1.00 (on 07/24/16, Right ABI: 1.05 and Left >1.3). A right lower extremity arterial duplex is notable for biphasic flow distally, >50% stenosis in the SFA / stent, >75% stenosis in the TP trunk and >50% stenosis in the common femoral artery.  1. Atherosclerosis of native arteries of the extremities with ulceration ([place]) - Stable Right foot ulceration has healed and patient is asymptomatic.  Decrease in ABI and stenosis on duplex. No intervention at this time due to patient being asymptomatic and ulceration healing however she may need intervention in the future.  Patient to follow up in six months. I have discussed with the patient at length the risk factors for and pathogenesis of atherosclerotic disease and encouraged a healthy diet, regular exercise regimen and blood pressure / glucose control.  The patient was encouraged to call the office in the interim if she experiences any claudication like symptoms, rest pain or ulcers to her feet / toes.  - VAS [place] ABI WITH/WO TBI; Future - VAS [place] LOWER EXTREMITY ARTERIAL DUPLEX; Future  2. Diabetic ulcer of toe of right foot associated with type 2 diabetes mellitus, with necrosis of muscle (HCC) - Improved Ulceration has healed. Encouraged good glucose control.  3. Type 2 diabetes mellitus with diabetic peripheral  angiopathy without gangrene, with long-term current use of insulin (HCC) - Stable Encouraged good control as its slows the progression of atherosclerotic disease  Current Outpatient Prescriptions on File Prior to Visit  Medication Sig Dispense Refill  . amoxicillin-clavulanate (AUGMENTIN) 875-125 MG tablet Take 1 tablet by mouth 2 (two) times daily. 14 tablet 0  . aspirin 81 MG tablet Take 1 tablet (81 mg total) by mouth daily.    . bisacodyl (DULCOLAX) 10 MG suppository Place 1 suppository (10 mg total) rectally daily as needed for moderate constipation. 12 suppository 0  . Blood Glucose Monitoring Suppl (ONE TOUCH ULTRA MINI) w/Device KIT     . clopidogrel (PLAVIX) 75 MG tablet Take 1 tablet (75 mg total) by mouth daily. 90 tablet 0  . docusate sodium (COLACE) 100 MG capsule Take 1 capsule (100 mg total) by mouth 2 (two) times daily. 10 capsule 0  . doxycycline (VIBRA-TABS) 100  MG tablet Take by mouth.    Marland Kitchen glucose monitoring kit (FREESTYLE) monitoring kit 1 each by Does not apply route 4 (four) times daily -  before meals and at bedtime. 1 each 0  . insulin aspart (NOVOLOG) 100 UNIT/ML injection Inject 0-15 Units into the skin 3 (three) times daily with meals. 10 mL 11  . lisinopril (PRINIVIL,ZESTRIL) 20 MG tablet Take 20 mg by mouth daily.    . ONE TOUCH ULTRA TEST test strip     . polyethylene glycol (MIRALAX / GLYCOLAX) packet Take 17 g by mouth daily as needed for mild constipation. 14 each 0  . simvastatin (ZOCOR) 20 MG tablet Take 20 mg by mouth daily.    Marland Kitchen HYDROcodone-acetaminophen (NORCO/VICODIN) 5-325 MG tablet Take 1 tablet by mouth every 6 (six) hours as needed for moderate pain. (Patient not taking: Reported on 10/19/2016) 15 tablet 0  . insulin aspart (NOVOLOG) 100 UNIT/ML injection Inject 3 Units into the skin 3 (three) times daily with meals. (Patient not taking: Reported on 10/19/2016) 10 mL 11  . insulin glargine (LANTUS) 100 UNIT/ML injection Inject 0.15 mLs (15 Units total)  into the skin at bedtime. (Patient not taking: Reported on 10/19/2016) 10 mL 11   No current facility-administered medications on file prior to visit.     There are no Patient Instructions on file for this visit. No Follow-up on file.   Ruffin Lada A Charonda Hefter, PA-C

## 2016-10-25 ENCOUNTER — Encounter (INDEPENDENT_AMBULATORY_CARE_PROVIDER_SITE_OTHER): Payer: Medicare Other

## 2017-04-26 ENCOUNTER — Ambulatory Visit (INDEPENDENT_AMBULATORY_CARE_PROVIDER_SITE_OTHER): Payer: Medicare Other

## 2017-04-26 ENCOUNTER — Ambulatory Visit (INDEPENDENT_AMBULATORY_CARE_PROVIDER_SITE_OTHER): Payer: Medicare Other | Admitting: Vascular Surgery

## 2017-04-26 DIAGNOSIS — I7025 Atherosclerosis of native arteries of other extremities with ulceration: Secondary | ICD-10-CM | POA: Diagnosis not present

## 2017-04-26 LAB — VAS US LOWER EXTREMITY ARTERIAL DUPLEX
RIGHT ANT DIST TIBAL DIA PSV: 3 cm/s
RIGHT ANT DIST TIBAL SYS PSV: 12 cm/s
RIGHT PERO MID SYS: 47 cm/s
RIGHT POPLITEAL PROX EDV: 11 cm/s
RIGHT SUPER FEMORAL DIST EDV: -13 cm/s
RPERPSV: -57 cm/s
RPOPDPSV: -46 cm/s
RPOPPPSV: 47 cm/s
RSFMPSV: -45 cm/s
RSFPPSV: -559 cm/s
RTIBDISTSYS: 15 cm/s
RTPOPDISDIA: -9 cm/s
RTSFAMIDDIA: -10 cm/s
RTSFAPROXDIA: -76 cm/s
Right super femoral dist sys PSV: -62 cm/s

## 2017-04-30 ENCOUNTER — Encounter (INDEPENDENT_AMBULATORY_CARE_PROVIDER_SITE_OTHER): Payer: Self-pay | Admitting: Vascular Surgery

## 2017-04-30 ENCOUNTER — Ambulatory Visit (INDEPENDENT_AMBULATORY_CARE_PROVIDER_SITE_OTHER): Payer: Medicare Other | Admitting: Vascular Surgery

## 2017-04-30 VITALS — BP 189/75 | HR 71 | Resp 16 | Wt 172.0 lb

## 2017-04-30 DIAGNOSIS — I7025 Atherosclerosis of native arteries of other extremities with ulceration: Secondary | ICD-10-CM | POA: Diagnosis not present

## 2017-04-30 DIAGNOSIS — E785 Hyperlipidemia, unspecified: Secondary | ICD-10-CM | POA: Diagnosis not present

## 2017-04-30 DIAGNOSIS — E1151 Type 2 diabetes mellitus with diabetic peripheral angiopathy without gangrene: Secondary | ICD-10-CM | POA: Diagnosis not present

## 2017-04-30 DIAGNOSIS — Z794 Long term (current) use of insulin: Secondary | ICD-10-CM

## 2017-04-30 DIAGNOSIS — E11621 Type 2 diabetes mellitus with foot ulcer: Secondary | ICD-10-CM | POA: Diagnosis not present

## 2017-04-30 DIAGNOSIS — I10 Essential (primary) hypertension: Secondary | ICD-10-CM | POA: Diagnosis not present

## 2017-04-30 DIAGNOSIS — L97511 Non-pressure chronic ulcer of other part of right foot limited to breakdown of skin: Secondary | ICD-10-CM

## 2017-04-30 NOTE — Progress Notes (Signed)
MRN : [id number]  Beth Mcguire is a 76 y.o. ([birth year]) female who presents with chief complaint of  Chief Complaint  Patient presents with  . Follow-up    abnormal ultrasound  .  History of Present Illness: Patient returns today in follow up of PAD with ulceration. She has missed several follow-up appointments and had not been in the office in over 6 months. Her podiatrist insisted she come back and have her blood flow checked because her ulcerations on both feet have not been healing very well. She says she thinks they're doing a lot better now although the podiatrist does not seem to agree with this assessment. She does not have any fevers or chills. She says her feet do not hurt. Her ABIs were markedly reduced bilaterally and the 0.5 range with poor waveforms worse on the left than the right.  Current Outpatient Prescriptions  Medication Sig Dispense Refill  . aspirin 81 MG tablet Take 1 tablet (81 mg total) by mouth daily.    . bisacodyl (DULCOLAX) 10 MG suppository Place 1 suppository (10 mg total) rectally daily as needed for moderate constipation. 12 suppository 0  . Blood Glucose Monitoring Suppl (ONE TOUCH ULTRA MINI) w/Device KIT     . clopidogrel (PLAVIX) 75 MG tablet Take 1 tablet (75 mg total) by mouth daily. 90 tablet 0  . docusate sodium (COLACE) 100 MG capsule Take 1 capsule (100 mg total) by mouth 2 (two) times daily. 10 capsule 0  . glucose monitoring kit (FREESTYLE) monitoring kit 1 each by Does not apply route 4 (four) times daily -  before meals and at bedtime. 1 each 0  . HYDROcodone-acetaminophen (NORCO/VICODIN) 5-325 MG tablet Take 1 tablet by mouth every 6 (six) hours as needed for moderate pain. 15 tablet 0  . Insulin Lispro Prot & Lispro (HUMALOG MIX 75/25 KWIKPEN) (75-25) 100 UNIT/ML Kwikpen     . lisinopril (PRINIVIL,ZESTRIL) 20 MG tablet Take 20 mg by mouth daily.    . ONE TOUCH ULTRA TEST test strip     . polyethylene glycol (MIRALAX / GLYCOLAX) packet  Take 17 g by mouth daily as needed for mild constipation. 14 each 0  . simvastatin (ZOCOR) 20 MG tablet Take 20 mg by mouth daily.    Marland Kitchen amoxicillin-clavulanate (AUGMENTIN) 875-125 MG tablet Take 1 tablet by mouth 2 (two) times daily. (Patient not taking: Reported on 04/30/2017) 14 tablet 0  . doxycycline (VIBRA-TABS) 100 MG tablet Take by mouth.    . insulin aspart (NOVOLOG) 100 UNIT/ML injection Inject 0-15 Units into the skin 3 (three) times daily with meals. (Patient not taking: Reported on 04/30/2017) 10 mL 11  . insulin aspart (NOVOLOG) 100 UNIT/ML injection Inject 3 Units into the skin 3 (three) times daily with meals. (Patient not taking: Reported on 10/19/2016) 10 mL 11  . insulin glargine (LANTUS) 100 UNIT/ML injection Inject 0.15 mLs (15 Units total) into the skin at bedtime. (Patient not taking: Reported on 10/19/2016) 10 mL 11   No current facility-administered medications for this visit.     Past Medical History:  Diagnosis Date  . Anxiety   . Colon cancer ([place])   . Diabetes ([place])   . Hypertension   . Peripheral vascular disease [facility])     Past Surgical History:  Procedure Laterality Date  . AMPUTATION Right 01/14/2016   Procedure: AMPUTATION RAY;  Surgeon: Albertine Patricia, DPM;  Location: [facility] ORS;  Service: Podiatry;  Laterality: Right;  . COLON SURGERY    .  PERIPHERAL VASCULAR CATHETERIZATION N/A 01/05/2016   Procedure: Lower Extremity Angiography;  Surgeon: Algernon Huxley, MD;  Location: [place] [facility];  Service: Cardiovascular;  Laterality: N/A;  . PERIPHERAL VASCULAR CATHETERIZATION  01/05/2016   Procedure: Lower Extremity Intervention;  Surgeon: Algernon Huxley, MD;  Location: [place] [facility];  Service: Cardiovascular;;  . PERIPHERAL VASCULAR CATHETERIZATION Right 06/25/2016   Procedure: Lower Extremity Angiography;  Surgeon: Algernon Huxley, MD;  Location: [place] [facility];  Service: Cardiovascular;  Laterality: Right;  . PERIPHERAL VASCULAR CATHETERIZATION  06/25/2016    Procedure: Lower Extremity Intervention;  Surgeon: Algernon Huxley, MD;  Location: [place] [facility];  Service: Cardiovascular;;  . TONSILLECTOMY       Social History     Social History  Substance Use Topics  . Smoking status: Never Smoker  . Smokeless tobacco: Not on file  . Alcohol use Not on file    Married. Lives with spouse  Family History      Family History  Problem Relation Age of Onset  . Diabetes Other     No bleeding or clotting disorders  No Known Allergies   REVIEW OF SYSTEMS (Negative unless checked)  Constitutional: '[]' Weight loss  '[]' Fever  '[]' Chills Cardiac: '[]' Chest pain   '[]' Chest pressure   '[]' Palpitations   '[]' Shortness of breath when laying flat   '[]' Shortness of breath at rest   '[]' Shortness of breath with exertion. Vascular:  '[]' Pain in legs with walking   '[]' Pain in legs at rest   '[]' Pain in legs when laying flat   '[]' Claudication   '[]' Pain in feet when walking  '[x]' Pain in feet at rest  '[]' Pain in feet when laying flat   '[]' History of DVT   '[]' Phlebitis   '[]' Swelling in legs   '[]' Varicose veins   '[x]' Non-healing ulcers Pulmonary:   '[]' Uses home oxygen   '[]' Productive cough   '[]' Hemoptysis   '[]' Wheeze  '[]' COPD   '[]' Asthma Neurologic:  '[]' Dizziness  '[]' Blackouts   '[]' Seizures   '[]' History of stroke   '[]' History of TIA  '[]' Aphasia   '[]' Temporary blindness   '[]' Dysphagia   '[]' Weakness or numbness in arms   '[]' Weakness or numbness in legs Musculoskeletal:  '[]' Arthritis   '[]' Joint swelling   '[]' Joint pain   '[]' Low back pain Hematologic:  '[]' Easy bruising  '[]' Easy bleeding   '[]' Hypercoagulable state   '[]' Anemic   Gastrointestinal:  '[]' Blood in stool   '[]' Vomiting blood  '[]' Gastroesophageal reflux/heartburn   '[]' Abdominal pain Genitourinary:  '[]' Chronic kidney disease   '[]' Difficult urination  '[]' Frequent urination  '[]' Burning with urination   '[]' Hematuria Skin:  '[]' Rashes   '[x]' Ulcers   '[x]' Wounds Psychological:  '[]' History of anxiety   '[]'  History of major depression.    Physical Examination  BP  (!) 189/75   Pulse 71   Resp 16   Wt 78 kg (172 lb)   BMI 31.46 kg/m  Gen:  WD/WN, NAD Head: [place]/AT, No temporalis wasting. Ear/Nose/Throat: Hearing grossly intact, nares w/o erythema or drainage, trachea midline Eyes: Conjunctiva clear. Sclera non-icteric Neck: Supple.  No JVD.  Pulmonary:  Good air movement, no use of accessory muscles.  Cardiac: Irregular Vascular:  Vessel Right Left  Radial Palpable Palpable                      Popliteal Not Palpable Not Palpable  PT Not Palpable Not Palpable  DP 1+ Palpable Trace Palpable    Musculoskeletal: M/S 5/5 throughout.  No deformity or atrophy. 1+ bilateral lower extremity  edema. Neurologic: Sensation grossly intact in extremities.  Symmetrical.  Speech is fluent.  Psychiatric: Judgment intact, Mood & affect appropriate for pt's clinical situation. Dermatologic: Small scab is present on the base of the right foot with no surrounding erythema or drainage. On the left foot, there is a pressure area at the medial first metatarsal head. No erythema surrounding or drainage.   Labs No results found for this or any previous visit (from the past 2160 hour(s)).  Radiology No results found.    Assessment/Plan Diabetes (HCC) blood glucose control important in reducing the progression of atherosclerotic disease. Also, involved in wound healing. On appropriate medications.   Essential hypertension blood pressure control important in reducing the progression of atherosclerotic disease. On appropriate oral medications.   Hyperlipidemia lipid control important in reducing the progression of atherosclerotic disease. Continue statin therapy  Atherosclerosis of native arteries of the extremities with ulceration (HCC) Her ABIs were markedly reduced bilaterally and the 0.5 range with poor waveforms worse on the left than the right. At this point, I discussed with the patient that it would be prudent to perform angiography with  possible revascularization due to the limb threatening nature of the situation. She declined angiography at this point. She says she wants to see how her wounds do now that they're getting better. I have discussed with her that if infection develops or she develops any sort of osteomyelitis, limb loss is certainly possible. She has agreed to return in 3-4 weeks and if the wounds have not healed that time, would consider angiography. I would also recommend she continue to follow with her podiatrist as regularly scheduled.    Leotis Pain, MD  05/01/2017 4:19 PM    This note was created with Dragon medical transcription system.  Any errors from dictation are purely unintentional

## 2017-05-01 NOTE — Assessment & Plan Note (Signed)
Her ABIs were markedly reduced bilaterally and the 0.5 range with poor waveforms worse on the left than the right. At this point, I discussed with the patient that it would be prudent to perform angiography with possible revascularization due to the limb threatening nature of the situation. She declined angiography at this point. She says she wants to see how her wounds do now that they're getting better. I have discussed with her that if infection develops or she develops any sort of osteomyelitis, limb loss is certainly possible. She has agreed to return in 3-4 weeks and if the wounds have not healed that time, would consider angiography. I would also recommend she continue to follow with her podiatrist as regularly scheduled.

## 2017-05-01 NOTE — Patient Instructions (Signed)

## 2017-05-31 ENCOUNTER — Ambulatory Visit (INDEPENDENT_AMBULATORY_CARE_PROVIDER_SITE_OTHER): Payer: Medicare Other | Admitting: Vascular Surgery

## 2017-05-31 ENCOUNTER — Encounter (INDEPENDENT_AMBULATORY_CARE_PROVIDER_SITE_OTHER): Payer: Self-pay | Admitting: Vascular Surgery

## 2017-05-31 VITALS — BP 200/73 | HR 66 | Resp 14 | Ht 62.0 in | Wt 176.0 lb

## 2017-05-31 DIAGNOSIS — L97511 Non-pressure chronic ulcer of other part of right foot limited to breakdown of skin: Secondary | ICD-10-CM

## 2017-05-31 DIAGNOSIS — E11621 Type 2 diabetes mellitus with foot ulcer: Secondary | ICD-10-CM

## 2017-05-31 DIAGNOSIS — E1151 Type 2 diabetes mellitus with diabetic peripheral angiopathy without gangrene: Secondary | ICD-10-CM | POA: Diagnosis not present

## 2017-05-31 DIAGNOSIS — I10 Essential (primary) hypertension: Secondary | ICD-10-CM

## 2017-05-31 DIAGNOSIS — E785 Hyperlipidemia, unspecified: Secondary | ICD-10-CM

## 2017-05-31 DIAGNOSIS — I7025 Atherosclerosis of native arteries of other extremities with ulceration: Secondary | ICD-10-CM | POA: Diagnosis not present

## 2017-05-31 DIAGNOSIS — Z794 Long term (current) use of insulin: Secondary | ICD-10-CM

## 2017-05-31 NOTE — Assessment & Plan Note (Signed)
The patient has wounds that are improving despite reduced perfusion. We again discussed that if she has any deterioration or infection, angiography would be recommended. At this point I will just plan to see her back in 2-3 months.

## 2017-05-31 NOTE — Progress Notes (Signed)
MRN : [id number]  Beth Mcguire is a 76 y.o. (18-Dec-1940) female who presents with chief complaint of  Chief Complaint  Patient presents with  . Follow-up    3-4 week follow up  .  History of Present Illness: Patient returns today in follow up of PAD. Her wounds continued to improve and she is under the care of Dr. Elvina Mattes who has ordered her specialty shoes at this point. She reports no fever or chills. She has no current pain. She has an ulcer on the medial side of the first metatarsal head on the left foot and on the base of the first metatarsal on the right foot. She has significantly reduced ABIs in the 0.5-0.6 range bilaterally, and we have previously discussed angiogram but she has declined this.  Current Outpatient Prescriptions  Medication Sig Dispense Refill  . aspirin 81 MG tablet Take 1 tablet (81 mg total) by mouth daily.    . bisacodyl (DULCOLAX) 10 MG suppository Place 1 suppository (10 mg total) rectally daily as needed for moderate constipation. 12 suppository 0  . Blood Glucose Monitoring Suppl (ONE TOUCH ULTRA MINI) w/Device KIT     . clopidogrel (PLAVIX) 75 MG tablet Take 1 tablet (75 mg total) by mouth daily. 90 tablet 0  . docusate sodium (COLACE) 100 MG capsule Take 1 capsule (100 mg total) by mouth 2 (two) times daily. 10 capsule 0  . glucose monitoring kit (FREESTYLE) monitoring kit 1 each by Does not apply route 4 (four) times daily -  before meals and at bedtime. 1 each 0  . HYDROcodone-acetaminophen (NORCO/VICODIN) 5-325 MG tablet Take 1 tablet by mouth every 6 (six) hours as needed for moderate pain. 15 tablet 0  . Insulin Lispro Prot & Lispro (HUMALOG MIX 75/25 KWIKPEN) (75-25) 100 UNIT/ML Kwikpen     . lisinopril (PRINIVIL,ZESTRIL) 20 MG tablet Take 20 mg by mouth daily.    . ONE TOUCH ULTRA TEST test strip     . polyethylene glycol (MIRALAX / GLYCOLAX) packet Take 17 g by mouth daily as needed for mild constipation. 14 each 0  . simvastatin  (ZOCOR) 20 MG tablet Take 20 mg by mouth daily.    Marland Kitchen amoxicillin-clavulanate (AUGMENTIN) 875-125 MG tablet Take 1 tablet by mouth 2 (two) times daily. (Patient not taking: Reported on 04/30/2017) 14 tablet 0  . doxycycline (VIBRA-TABS) 100 MG tablet Take by mouth.    . insulin aspart (NOVOLOG) 100 UNIT/ML injection Inject 0-15 Units into the skin 3 (three) times daily with meals. (Patient not taking: Reported on 04/30/2017) 10 mL 11  . insulin aspart (NOVOLOG) 100 UNIT/ML injection Inject 3 Units into the skin 3 (three) times daily with meals. (Patient not taking: Reported on 10/19/2016) 10 mL 11  . insulin glargine (LANTUS) 100 UNIT/ML injection Inject 0.15 mLs (15 Units total) into the skin at bedtime. (Patient not taking: Reported on 10/19/2016) 10 mL 11   No current facility-administered medications for this visit.         Past Medical History:  Diagnosis Date  . Anxiety   . Colon cancer ([place])   . Diabetes ([place])   . Hypertension   . Peripheral vascular disease [facility])          Past Surgical History:  Procedure Laterality Date  . AMPUTATION Right 01/14/2016   Procedure: AMPUTATION RAY;  Surgeon: Albertine Patricia, DPM;  Location: [facility] ORS;  Service: Podiatry;  Laterality: Right;  . COLON SURGERY    . PERIPHERAL VASCULAR  CATHETERIZATION N/A 01/05/2016   Procedure: Lower Extremity Angiography;  Surgeon: Algernon Huxley, MD;  Location: [place] [facility];  Service: Cardiovascular;  Laterality: N/A;  . PERIPHERAL VASCULAR CATHETERIZATION  01/05/2016   Procedure: Lower Extremity Intervention;  Surgeon: Algernon Huxley, MD;  Location: [place] [facility];  Service: Cardiovascular;;  . PERIPHERAL VASCULAR CATHETERIZATION Right 06/25/2016   Procedure: Lower Extremity Angiography;  Surgeon: Algernon Huxley, MD;  Location: [place] [facility];  Service: Cardiovascular;  Laterality: Right;  . PERIPHERAL VASCULAR CATHETERIZATION  06/25/2016   Procedure: Lower Extremity Intervention;   Surgeon: Algernon Huxley, MD;  Location: [place] [facility];  Service: Cardiovascular;;  . TONSILLECTOMY       Social History     Social History  Substance Use Topics  . Smoking status: Never Smoker  . Smokeless tobacco: Not on file  . Alcohol use Not on file    Married. Lives with spouse  Family History      Family History  Problem Relation Age of Onset  . Diabetes Other     No bleeding or clotting disorders  No Known Allergies   REVIEW OF SYSTEMS(Negative unless checked)  Constitutional: _0 Weight loss_1 Fever_2 Chills Cardiac:_3 Chest pain_4 Chest pressure_5 Palpitations _6 Shortness of breath when laying flat _7 Shortness of breath at rest _8 Shortness of breath with exertion. Vascular: _9 Pain in legs with walking_10 Pain in legsat rest_11 Pain in legs when laying flat _12 Claudication _13 Pain in feet when walking _14 Pain in feet at rest _15 Pain in feet when laying flat _16 History of DVT _17 Phlebitis _18 Swelling in legs _19 Varicose veins _20 Non-healing ulcers Pulmonary: _21 Uses home oxygen _22 Productive cough_23 Hemoptysis _24 Wheeze _25 COPD _26 Asthma Neurologic: _27 Dizziness _28 Blackouts _29 Seizures _30 History of stroke _31 History of TIA_32 Aphasia _33 Temporary blindness_34 Dysphagia _35 Weaknessor numbness in arms _36 Weakness or numbnessin legs Musculoskeletal: _37 Arthritis _38 Joint swelling _39 Joint pain _40 Low back pain Hematologic:_41 Easy bruising_42 Easy bleeding _43 Hypercoagulable state _44 Anemic  Gastrointestinal:_45 Blood in stool_46 Vomiting blood_47 Gastroesophageal reflux/heartburn_48 Abdominal pain Genitourinary: _49 Chronic kidney disease _50 Difficulturination _51 Frequenturination _52 Burning with urination_53 Hematuria Skin: _54 Rashes _55 Ulcers _56 Wounds Psychological: _57 History of anxiety_58 History of major depression.   Physical Examination  BP (!) 200/73 (BP Location: Right Arm)    Pulse 66   Resp 14   Ht _59  (1.575 m)   Wt 176 lb (79.8 kg)   BMI 32.19 kg/m  Gen:  WD/WN, NAD Head: [place]/AT, No temporalis wasting. Ear/Nose/Throat: Hearing grossly intact, nares w/o erythema or drainage, trachea midline Eyes: Conjunctiva clear. Sclera non-icteric Neck: Supple.  No JVD.  Pulmonary:  Good air movement, no use of accessory muscles.  Cardiac: RRR, normal S1, S2 Vascular:  Vessel Right Left  Radial Palpable Palpable                          PT 1+ Palpable Not Palpable  DP Trace Palpable 1+ Palpable    Musculoskeletal: M/S 5/5 throughout.  No deformity or atrophy. Trace bilateral lower extremity edema. Neurologic: Sensation grossly intact in extremities.  Symmetrical.  Speech is fluent.  Psychiatric: Judgment intact, Mood & affect appropriate for pt's clinical situation. Dermatologic: Previous bilateral feet ulcers as described above have almost completely healed      Labs No results found for this or any previous visit (from the past 2160 hour(s)).  Radiology No results found.    Assessment/Plan Diabetes (HCC) blood glucose control important in reducing the progression of atherosclerotic disease. Also, involved in wound healing. On appropriate medications.   Essential hypertension blood pressure control important in reducing the progression of atherosclerotic disease. On appropriate oral medications.   Hyperlipidemia lipid control important  in reducing the progression of atherosclerotic disease. Continue statin therapy  Atherosclerosis of native arteries of the extremities with ulceration ([place]) The patient has wounds that are improving despite reduced perfusion. We again discussed that if she has any deterioration or infection, angiography would be recommended. At this point I will just plan to see her back in 2-3 months.    Leotis Pain, MD  05/31/2017 3:08 PM    This note was created with Dragon medical transcription system.  Any  errors from dictation are purely unintentional

## 2017-05-31 NOTE — Patient Instructions (Signed)

## 2017-06-20 ENCOUNTER — Telehealth (INDEPENDENT_AMBULATORY_CARE_PROVIDER_SITE_OTHER): Payer: Self-pay | Admitting: Vascular Surgery

## 2017-06-20 NOTE — Telephone Encounter (Signed)
States mother ran out of plavix (generic-starts with a C) yesterday. Please call in new RX.  [facility] on [address]. Please call dtr when this in done.

## 2017-06-21 NOTE — Telephone Encounter (Signed)
A prescription was called in for Clopidogrel 75mg , 1 tab by mouth daily #30 with 5 refills to [facility]. Attempted to call the daughter and was unable to leave a voicemail due to voicemail not being set up.

## 2017-08-09 ENCOUNTER — Other Ambulatory Visit (INDEPENDENT_AMBULATORY_CARE_PROVIDER_SITE_OTHER): Payer: Self-pay | Admitting: Vascular Surgery

## 2017-08-09 ENCOUNTER — Encounter (INDEPENDENT_AMBULATORY_CARE_PROVIDER_SITE_OTHER): Payer: Self-pay

## 2017-08-09 ENCOUNTER — Ambulatory Visit (INDEPENDENT_AMBULATORY_CARE_PROVIDER_SITE_OTHER): Payer: Medicare Other

## 2017-08-09 ENCOUNTER — Ambulatory Visit (INDEPENDENT_AMBULATORY_CARE_PROVIDER_SITE_OTHER): Payer: Medicare Other | Admitting: Vascular Surgery

## 2017-08-09 ENCOUNTER — Encounter (INDEPENDENT_AMBULATORY_CARE_PROVIDER_SITE_OTHER): Payer: Self-pay | Admitting: Vascular Surgery

## 2017-08-09 VITALS — BP 202/64 | HR 63 | Resp 16 | Wt 175.0 lb

## 2017-08-09 DIAGNOSIS — Z794 Long term (current) use of insulin: Secondary | ICD-10-CM

## 2017-08-09 DIAGNOSIS — I7025 Atherosclerosis of native arteries of other extremities with ulceration: Secondary | ICD-10-CM

## 2017-08-09 DIAGNOSIS — E1151 Type 2 diabetes mellitus with diabetic peripheral angiopathy without gangrene: Secondary | ICD-10-CM

## 2017-08-09 DIAGNOSIS — I10 Essential (primary) hypertension: Secondary | ICD-10-CM

## 2017-08-09 DIAGNOSIS — E785 Hyperlipidemia, unspecified: Secondary | ICD-10-CM

## 2017-08-09 NOTE — Progress Notes (Signed)
MRN : [id number]  Beth Mcguire is a 76 y.o. (May 08, 1941) female who presents with chief complaint of  Chief Complaint  Patient presents with  . Follow-up    2-3 mo abi  .  History of Present Illness: Patient returns today in follow up of PAD.  She has developed a new ulceration between the first and second toes on the left foot.  This is new since her last visit.  She had some sort of pressure device for a area on her metatarsal head which was an impending ulcer, but now she has a large actual ulceration.  Her right foot ulceration is basically stable.  She has no fevers or chills.  She recently completed a course of antibiotics.  There is some lower extremity swelling but is stable. Noninvasive studies were performed today demonstrating stable right ABI of about 0.6 with a digit pressure of only 37.  Her left ABI has dropped to 0.47 with a digit pressure of only 33.  Current Outpatient Prescriptions  Medication Sig Dispense Refill  . aspirin 81 MG tablet Take 1 tablet (81 mg total) by mouth daily.    . bisacodyl (DULCOLAX) 10 MG suppository Place 1 suppository (10 mg total) rectally daily as needed for moderate constipation. 12 suppository 0  . Blood Glucose Monitoring Suppl (ONE TOUCH ULTRA MINI) w/Device KIT     . clopidogrel (PLAVIX) 75 MG tablet Take 1 tablet (75 mg total) by mouth daily. 90 tablet 0  . docusate sodium (COLACE) 100 MG capsule Take 1 capsule (100 mg total) by mouth 2 (two) times daily. 10 capsule 0  . glucose monitoring kit (FREESTYLE) monitoring kit 1 each by Does not apply route 4 (four) times daily -  before meals and at bedtime. 1 each 0  . HYDROcodone-acetaminophen (NORCO/VICODIN) 5-325 MG tablet Take 1 tablet by mouth every 6 (six) hours as needed for moderate pain. 15 tablet 0  . Insulin Lispro Prot & Lispro (HUMALOG MIX 75/25 KWIKPEN) (75-25) 100 UNIT/ML Kwikpen     . lisinopril (PRINIVIL,ZESTRIL) 20 MG tablet Take 20 mg by mouth daily.    . ONE  TOUCH ULTRA TEST test strip     . polyethylene glycol (MIRALAX / GLYCOLAX) packet Take 17 g by mouth daily as needed for mild constipation. 14 each 0  . simvastatin (ZOCOR) 20 MG tablet Take 20 mg by mouth daily.    Marland Kitchen amoxicillin-clavulanate (AUGMENTIN) 875-125 MG tablet Take 1 tablet by mouth 2 (two) times daily. (Patient not taking: Reported on 04/30/2017) 14 tablet 0  . doxycycline (VIBRA-TABS) 100 MG tablet Take by mouth.    . insulin aspart (NOVOLOG) 100 UNIT/ML injection Inject 0-15 Units into the skin 3 (three) times daily with meals. (Patient not taking: Reported on 04/30/2017) 10 mL 11  . insulin aspart (NOVOLOG) 100 UNIT/ML injection Inject 3 Units into the skin 3 (three) times daily with meals. (Patient not taking: Reported on 10/19/2016) 10 mL 11  . insulin glargine (LANTUS) 100 UNIT/ML injection Inject 0.15 mLs (15 Units total) into the skin at bedtime. (Patient not taking: Reported on 10/19/2016) 10 mL 11   No current facility-administered medications for this visit.         Past Medical History:  Diagnosis Date  . Anxiety   . Colon cancer ([place])   . Diabetes ([place])   . Hypertension   . Peripheral vascular disease [facility])          Past Surgical History:  Procedure Laterality Date  .  AMPUTATION Right 01/14/2016   Procedure: AMPUTATION RAY;  Surgeon: Albertine Patricia, DPM;  Location: [facility] ORS;  Service: Podiatry;  Laterality: Right;  . COLON SURGERY    . PERIPHERAL VASCULAR CATHETERIZATION N/A 01/05/2016   Procedure: Lower Extremity Angiography;  Surgeon: Algernon Huxley, MD;  Location: [place] [facility];  Service: Cardiovascular;  Laterality: N/A;  . PERIPHERAL VASCULAR CATHETERIZATION  01/05/2016   Procedure: Lower Extremity Intervention;  Surgeon: Algernon Huxley, MD;  Location: [place] [facility];  Service: Cardiovascular;;  . PERIPHERAL VASCULAR CATHETERIZATION Right 06/25/2016   Procedure: Lower Extremity Angiography;  Surgeon: Algernon Huxley, MD;  Location:  [place] [facility];  Service: Cardiovascular;  Laterality: Right;  . PERIPHERAL VASCULAR CATHETERIZATION  06/25/2016   Procedure: Lower Extremity Intervention;  Surgeon: Algernon Huxley, MD;  Location: [place] [facility];  Service: Cardiovascular;;  . TONSILLECTOMY       Social History     Social History  Substance Use Topics  . Smoking status: Never Smoker  . Smokeless tobacco: Not on file  . Alcohol use Not on file  No IV drug use Married. Lives with spouse  Family History      Family History  Problem Relation Age of Onset  . Diabetes Other   No aneurysms or porphyria No bleeding or clotting disorders  No Known Allergies   REVIEW OF SYSTEMS(Negative unless checked)  Constitutional: '[]' Weight loss'[]' Fever'[]' Chills Cardiac:'[]' Chest pain'[]' Chest pressure'[]' Palpitations '[]' Shortness of breath when laying flat '[]' Shortness of breath at rest '[]' Shortness of breath with exertion. Vascular: '[]' Pain in legs with walking'[]' Pain in legsat rest'[]' Pain in legs when laying flat '[]' Claudication '[]' Pain in feet when walking '[x]' Pain in feet at rest '[]' Pain in feet when laying flat '[]' History of DVT '[]' Phlebitis '[]' Swelling in legs '[]' Varicose veins '[x]' Non-healing ulcers Pulmonary: '[]' Uses home oxygen '[]' Productive cough'[]' Hemoptysis '[]' Wheeze '[]' COPD '[]' Asthma Neurologic: '[]' Dizziness '[]' Blackouts '[]' Seizures '[]' History of stroke '[]' History of TIA'[]' Aphasia '[]' Temporary blindness'[]' Dysphagia '[]' Weaknessor numbness in arms '[]' Weakness or numbnessin legs Musculoskeletal: '[]' Arthritis '[]' Joint swelling '[]' Joint pain '[]' Low back pain Hematologic:'[]' Easy bruising'[]' Easy bleeding '[]' Hypercoagulable state '[]' Anemic  Gastrointestinal:'[]' Blood in stool'[]' Vomiting blood'[]' Gastroesophageal reflux/heartburn'[]' Abdominal pain Genitourinary: '[]' Chronic kidney disease '[]' Difficulturination '[]' Frequenturination '[]' Burning with  urination'[]' Hematuria Skin: '[]' Rashes '[x]' Ulcers '[x]' Wounds Psychological: '[]' History of anxiety'[]' History of major depression.   Physical Examination  BP (!) 202/64 (BP Location: Left Arm)   Pulse 63   Resp 16   Wt 79.4 kg (175 lb)   BMI 32.01 kg/m  Gen:  WD/WN, NAD Head: [place]/AT, No temporalis wasting. Ear/Nose/Throat: Hearing grossly intact, nares w/o erythema or drainage, trachea midline Eyes: Conjunctiva clear. Sclera non-icteric Neck: Supple.  No JVD.  Pulmonary:  Good air movement, no use of accessory muscles.  Cardiac: Irregular Vascular:  Vessel Right Left  Radial Palpable Palpable                      Popliteal  not palpable  not palpable  PT  not palpable  not palpable  DP  not palpable  not palpable   Musculoskeletal: M/S 5/5 throughout.  No deformity or atrophy.  1+ bilateral lower extremity edema.  Wounds as described below Neurologic: Sensation grossly intact in extremities.  Symmetrical.  Speech is fluent.  Psychiatric: Judgment intact, Mood & affect appropriate for pt's clinical situation. Dermatologic: Stable ulceration on the right foot.  The ulceration on the left foot is new and is a very pale unhealthy appearing ulceration between toes 1 and 2 at the base of the first toe.  There is mild to moderate surrounding erythema.  No purulent  drainage.      Labs No results found for this or any previous visit (from the past 2160 hour(s)).  Radiology No results found.    Assessment/Plan Diabetes (HCC) blood glucose control important in reducing the progression of atherosclerotic disease. Also, involved in wound healing. On appropriate medications.   Essential hypertension blood pressure control important in reducing the progression of atherosclerotic disease. On appropriate oral medications.   Hyperlipidemia lipid control important in reducing the progression of atherosclerotic disease. Continue statin therapy  Atherosclerosis of  native arteries of the extremities with ulceration ([place]) Noninvasive studies were performed today demonstrating stable right ABI of about 0.6 with a digit pressure of only 37.  Her left ABI has dropped to 0.47 with a digit pressure of only 33. This represents a critical and limb threatening situation.  Although I remain concerned about her right leg, her left leg is now a more worrisome situation strongly recommended she have an angiogram and she has agreed to that today.  This will be scheduled at her convenience in the near future.  Continue current medical regimen including aspirin, Plavix, and a statin agent.    Leotis Pain, MD  08/09/2017 3:54 PM    This note was created with Dragon medical transcription system.  Any errors from dictation are purely unintentional

## 2017-08-09 NOTE — Patient Instructions (Signed)
Angiogram  An angiogram, also called angiography, is a procedure used to look at the blood vessels. In this procedure, dye is injected through a long, thin tube (catheter) into an artery. X-rays are then taken. The X-rays will show if there is a blockage or problem in a blood vessel.  Tell a health care provider about:   Any allergies you have, including allergies to shellfish or contrast dye.   All medicines you are taking, including vitamins, herbs, eye drops, creams, and over-the-counter medicines.   Any problems you or family members have had with anesthetic medicines.   Any blood disorders you have.   Any surgeries you have had.   Any previous kidney problems or failure you have had.   Any medical conditions you have.   Possibility of pregnancy, if this applies.  What are the risks?  Generally, an angiogram is a safe procedure. However, as with any procedure, problems can occur. Possible problems include:   Injury to the blood vessels, including rupture or bleeding.   Infection or bruising at the catheter site.   Allergic reaction to the dye or contrast used.   Kidney damage from the dye or contrast used.   Blood clots that can lead to a stroke or heart attack.    What happens before the procedure?   Do not eat or drink after midnight on the night before the procedure, or as directed by your health care provider.   Ask your health care provider if you may drink enough water to take any needed medicines the morning of the procedure.  What happens during the procedure?   You may be given a medicine to help you relax (sedative) before and during the procedure. This medicine is given through an IV access tube that is inserted into one of your veins.   The area where the catheter will be inserted will be washed and shaved. This is usually done in the groin but may be done in the fold of your arm (near your elbow) or in the wrist.   A medicine will be given to numb the area where the catheter will  be inserted (local anesthetic).   The catheter will be inserted with a guide wire into an artery. The catheter is guided by using a type of X-ray (fluoroscopy) to the blood vessel being examined.   Dye is then injected into the catheter, and X-rays are taken. The dye helps to show where any narrowing or blockages are located.  What happens after the procedure?   If the procedure is done through the leg, you will be kept in bed lying flat for several hours. You will be instructed to not bend or cross your legs.   The insertion site will be checked frequently.   The pulse in your feet or wrist will be checked frequently.   Additional blood tests, X-rays, and electrocardiography may be done.   You may need to stay in the hospital overnight for observation.  This information is not intended to replace advice given to you by your health care provider. Make sure you discuss any questions you have with your health care provider.  Document Released: 07/04/2005 Document Revised: 03/07/2016 Document Reviewed: 02/25/2013  Elsevier Interactive Patient Education  2017 [employer]

## 2017-08-09 NOTE — Assessment & Plan Note (Signed)
Noninvasive studies were performed today demonstrating stable right ABI of about 0.6 with a digit pressure of only 37.  Her left ABI has dropped to 0.47 with a digit pressure of only 33. This represents a critical and limb threatening situation.  Although I remain concerned about her right leg, her left leg is now a more worrisome situation strongly recommended she have an angiogram and she has agreed to that today.  This will be scheduled at her convenience in the near future.  Continue current medical regimen including aspirin, Plavix, and a statin agent.

## 2017-08-13 DIAGNOSIS — E7800 Pure hypercholesterolemia, unspecified: Secondary | ICD-10-CM | POA: Insufficient documentation

## 2017-08-14 ENCOUNTER — Encounter: Payer: Self-pay | Admitting: *Deleted

## 2017-08-14 ENCOUNTER — Encounter
Admission: RE | Disposition: A | Discharge status: Home / Self Care | Payer: Self-pay | Source: Ambulatory Visit | Attending: Vascular Surgery

## 2017-08-14 ENCOUNTER — Ambulatory Visit
Admission: RE | Admit: 2017-08-14 | Discharge: 2017-08-14 | Disposition: A | Discharge status: Home / Self Care | Payer: Medicare Other | Source: Ambulatory Visit | Attending: Vascular Surgery | Admitting: Vascular Surgery

## 2017-08-14 DIAGNOSIS — E1151 Type 2 diabetes mellitus with diabetic peripheral angiopathy without gangrene: Secondary | ICD-10-CM | POA: Insufficient documentation

## 2017-08-14 DIAGNOSIS — I10 Essential (primary) hypertension: Secondary | ICD-10-CM | POA: Diagnosis not present

## 2017-08-14 DIAGNOSIS — I7025 Atherosclerosis of native arteries of other extremities with ulceration: Secondary | ICD-10-CM | POA: Insufficient documentation

## 2017-08-14 DIAGNOSIS — I70299 Other atherosclerosis of native arteries of extremities, unspecified extremity: Secondary | ICD-10-CM

## 2017-08-14 DIAGNOSIS — E11621 Type 2 diabetes mellitus with foot ulcer: Secondary | ICD-10-CM | POA: Insufficient documentation

## 2017-08-14 DIAGNOSIS — Z794 Long term (current) use of insulin: Secondary | ICD-10-CM | POA: Diagnosis not present

## 2017-08-14 DIAGNOSIS — E785 Hyperlipidemia, unspecified: Secondary | ICD-10-CM | POA: Diagnosis not present

## 2017-08-14 DIAGNOSIS — Z899 Acquired absence of limb, unspecified: Secondary | ICD-10-CM | POA: Insufficient documentation

## 2017-08-14 DIAGNOSIS — Z7982 Long term (current) use of aspirin: Secondary | ICD-10-CM | POA: Diagnosis not present

## 2017-08-14 DIAGNOSIS — L97529 Non-pressure chronic ulcer of other part of left foot with unspecified severity: Secondary | ICD-10-CM | POA: Insufficient documentation

## 2017-08-14 DIAGNOSIS — Z833 Family history of diabetes mellitus: Secondary | ICD-10-CM | POA: Diagnosis not present

## 2017-08-14 DIAGNOSIS — Z79899 Other long term (current) drug therapy: Secondary | ICD-10-CM | POA: Insufficient documentation

## 2017-08-14 DIAGNOSIS — Z7902 Long term (current) use of antithrombotics/antiplatelets: Secondary | ICD-10-CM | POA: Insufficient documentation

## 2017-08-14 DIAGNOSIS — Z9889 Other specified postprocedural states: Secondary | ICD-10-CM | POA: Insufficient documentation

## 2017-08-14 DIAGNOSIS — L97519 Non-pressure chronic ulcer of other part of right foot with unspecified severity: Secondary | ICD-10-CM | POA: Insufficient documentation

## 2017-08-14 DIAGNOSIS — L97909 Non-pressure chronic ulcer of unspecified part of unspecified lower leg with unspecified severity: Secondary | ICD-10-CM

## 2017-08-14 DIAGNOSIS — I70248 Atherosclerosis of native arteries of left leg with ulceration of other part of lower left leg: Secondary | ICD-10-CM | POA: Diagnosis not present

## 2017-08-14 HISTORY — PX: LOWER EXTREMITY ANGIOGRAPHY: CATH118251

## 2017-08-14 LAB — CREATININE, SERUM
CREATININE: 1.11 mg/dL — AB (ref 0.44–1.00)
GFR calc non Af Amer: 46 mL/min — ABNORMAL LOW (ref >60)
GFR, EST AFRICAN AMERICAN: 54 mL/min — AB (ref >60)

## 2017-08-14 LAB — BUN: BUN: 18 mg/dL (ref 6–20)

## 2017-08-14 LAB — GLUCOSE, CAPILLARY: Glucose-Capillary: 110 mg/dL — ABNORMAL HIGH (ref 65–99)

## 2017-08-14 SURGERY — LOWER EXTREMITY ANGIOGRAPHY
Anesthesia: Moderate Sedation | Laterality: Left

## 2017-08-14 MED ORDER — HEPARIN SODIUM (PORCINE) 1000 UNIT/ML IJ SOLN
INTRAMUSCULAR | Status: DC | PRN
  Administered 2017-08-14: 5000 [IU] via INTRAVENOUS

## 2017-08-14 MED ORDER — LABETALOL HCL 5 MG/ML IV SOLN
INTRAVENOUS | Status: DC | PRN
  Administered 2017-08-14: 10 mg via INTRAVENOUS

## 2017-08-14 MED ORDER — FENTANYL CITRATE (PF) 100 MCG/2ML IJ SOLN
INTRAMUSCULAR | Status: DC | PRN
  Administered 2017-08-14: 25 ug via INTRAVENOUS
  Administered 2017-08-14: 50 ug via INTRAVENOUS

## 2017-08-14 MED ORDER — HYDRALAZINE HCL 20 MG/ML IJ SOLN
INTRAMUSCULAR | Status: DC | PRN
  Administered 2017-08-14 (×3): 10 mg via INTRAVENOUS

## 2017-08-14 MED ORDER — FAMOTIDINE 20 MG PO TABS: 40.0000 mg | ORAL_TABLET | ORAL | Status: DC | PRN

## 2017-08-14 MED ORDER — HEPARIN SODIUM (PORCINE) 1000 UNIT/ML IJ SOLN
INTRAMUSCULAR | Status: AC
  Filled 2017-08-14: qty 1

## 2017-08-14 MED ORDER — MIDAZOLAM HCL 5 MG/5ML IJ SOLN
INTRAMUSCULAR | Status: AC
Start: 2017-08-14 — End: ?
  Filled 2017-08-14: qty 10

## 2017-08-14 MED ORDER — SODIUM CHLORIDE 0.9 % IV SOLN
INTRAVENOUS | Status: DC
Start: 2017-08-14 — End: 2017-08-14
  Administered 2017-08-14: 09:00:00 via INTRAVENOUS

## 2017-08-14 MED ORDER — MIDAZOLAM HCL 2 MG/2ML IJ SOLN
INTRAMUSCULAR | Status: DC | PRN
  Administered 2017-08-14 (×2): 1 mg via INTRAVENOUS

## 2017-08-14 MED ORDER — FENTANYL CITRATE (PF) 100 MCG/2ML IJ SOLN
INTRAMUSCULAR | Status: AC
  Filled 2017-08-14: qty 4

## 2017-08-14 MED ORDER — CEFAZOLIN SODIUM-DEXTROSE 2-4 GM/100ML-% IV SOLN
INTRAVENOUS | Status: AC
  Filled 2017-08-14: qty 100

## 2017-08-14 MED ORDER — ONDANSETRON HCL 4 MG/2ML IJ SOLN
INTRAMUSCULAR | Status: AC
  Administered 2017-08-14: 4 mg via INTRAVENOUS
  Filled 2017-08-14: qty 2

## 2017-08-14 MED ORDER — HYDRALAZINE HCL 20 MG/ML IJ SOLN
INTRAMUSCULAR | Status: AC
Start: 2017-08-14 — End: ?
  Filled 2017-08-14: qty 1

## 2017-08-14 MED ORDER — IOPAMIDOL (ISOVUE-300) INJECTION 61%
INTRAVENOUS | Status: DC | PRN
Start: 2017-08-14 — End: 2017-08-14
  Administered 2017-08-14: 60 mL via INTRAVENOUS

## 2017-08-14 MED ORDER — ONDANSETRON HCL 4 MG/2ML IJ SOLN: 4.0000 mg | Freq: Once | INTRAMUSCULAR | Status: DC | PRN

## 2017-08-14 MED ORDER — LIDOCAINE-EPINEPHRINE 1 %-1:200000 IJ SOLN
INTRAMUSCULAR | Status: AC
Start: 2017-08-14 — End: ?
  Filled 2017-08-14: qty 30

## 2017-08-14 MED ORDER — METHYLPREDNISOLONE SODIUM SUCC 125 MG IJ SOLR: 125.0000 mg | INTRAMUSCULAR | Status: DC | PRN

## 2017-08-14 MED ORDER — CEFAZOLIN SODIUM-DEXTROSE 2-4 GM/100ML-% IV SOLN: 2.0000 g | Freq: Once | INTRAVENOUS | Status: DC

## 2017-08-14 MED ORDER — CEFAZOLIN SODIUM-DEXTROSE 1-4 GM/50ML-% IV SOLN
INTRAVENOUS | Status: AC
  Administered 2017-08-14: 1 g via INTRAVENOUS
  Filled 2017-08-14: qty 50

## 2017-08-14 MED ORDER — LABETALOL HCL 5 MG/ML IV SOLN
INTRAVENOUS | Status: AC
Start: 2017-08-14 — End: ?
  Filled 2017-08-14: qty 4

## 2017-08-14 SURGICAL SUPPLY — 23 items
BALLN LUTONIX 4X220X130 (BALLOONS) ×2
BALLN LUTONIX 5X220X130 (BALLOONS) ×2
BALLN ULTRVRSE 018 2.5X150X150 (BALLOONS) ×2
BALLOON LUTONIX 4X220X130 (BALLOONS) ×1 IMPLANT
BALLOON LUTONIX 5X220X130 (BALLOONS) ×1 IMPLANT
BALLOON ULTRVS 018 2.5X150X150 (BALLOONS) ×1 IMPLANT
CATH BEACON 5 .038 100 VERT TP (CATHETERS) ×2 IMPLANT
CATH CXI SUPP ANG 2.6FR 150CM (MICROCATHETER) ×2 IMPLANT
CATH IMAGER II S 5FR 65CM (MISCELLANEOUS) ×2 IMPLANT
DEVICE PRESTO INFLATION (MISCELLANEOUS) ×2 IMPLANT
DEVICE STARCLOSE SE CLOSURE (Vascular Products) ×2 IMPLANT
DEVICE TORQUE (MISCELLANEOUS) ×2 IMPLANT
GLIDEWIRE STIFF .35X180X3 HYDR (WIRE) ×2 IMPLANT
PACK ANGIOGRAPHY (CUSTOM PROCEDURE TRAY) ×2 IMPLANT
SHEATH ANL2 6FRX45 HC (SHEATH) ×2 IMPLANT
SHEATH BRITE TIP 5FRX11 (SHEATH) ×2 IMPLANT
STENT VIABAHN 6X100X120 (Permanent Stent) ×2 IMPLANT
STENT VIABAHN 6X250X120 (Permanent Stent) ×2 IMPLANT
SYR MEDRAD MARK V 150ML (SYRINGE) ×2 IMPLANT
TUBING CONTRAST HIGH PRESS 72 (TUBING) ×2 IMPLANT
WIRE G V18X300CM (WIRE) ×2 IMPLANT
WIRE J 3MM .035X145CM (WIRE) ×2 IMPLANT
WIRE MAGIC TORQUE 260C (WIRE) ×2 IMPLANT

## 2017-08-14 NOTE — Progress Notes (Signed)
Pt complaining of nausea. Started to vomit, log rolled to right side. Pt with moderate amount of brown emesis. Also Pt incontinent of soft brown b. Right groin site intact. VSS. Po2 Sats 95-99 % on room air. Dr. Lucky Cowboy notified of Pt status. Zofran IVP ordered and given STAT. After clean up Pt resting quietly, denies pain and denies nausea. Will monitor closely.

## 2017-08-14 NOTE — Op Note (Signed)
[facility] Percutaneous Study/Intervention Procedural Note   Date of Surgery: 08/14/2017  Surgeon(s):DEW,JASON   Assistants:none  Pre-operative Diagnosis: PAD with ulceration bilateral lower extremity  Post-operative diagnosis: Same  Procedure(s) Performed: 1. Ultrasound guidance for vascular access right femoral artery 2. Catheter placement into left posterior tibial artery from right femoral approach 3. Aortogram and selective left lower extremity angiogram 4. Percutaneous transluminal angioplasty of tibioperoneal trunk proximally and posterior tibial artery distally with 2 inflations with a 2.5 mm diameter by 15 cm length angioplasty balloon 5. Percutaneous transluminal angioplasty of the SFA and popliteal arteries with 5 mm diameter by 22 cm length Lutonix drug-coated angioplasty balloon in the proximal segments and 4 mm diameter by 22 cm Lutonix drug-coated angioplasty balloon more distally  6.  Viabahn stent placement x2 in the left SFA and above-knee popliteal arteries for greater than 50% residual stenosis and dissection after angioplasty in multiple areas using a 6 mm diameter by 25 cm length stent and a 6 mm diameter by 10 cm length stent 7. StarClose closure device right femoral artery  EBL: 10 cc  Contrast: 60 cc  Fluoro Time: 7.9 minutes  Moderate Conscious Sedation Time: approximately 40 minutes using 2 mg of Versed and 75 Mcg of Fentanyl  Indications: Patient is a 76 y.o.female with nonhealing ulcerations bilaterally arterial disease. The patient has noninvasive study showing significant reduction in perfusion bilaterally worse on the left than the right. The patient is brought in for angiography for further evaluation and potential treatment. Risks and benefits are discussed and informed consent is obtained  Procedure: The patient was identified and  appropriate procedural time out was performed. The patient was then placed supine on the table and prepped and draped in the usual sterile fashion.Moderate conscious sedation was administered during a face to face encounter with the patient throughout the procedure with my supervision of the RN administering medicines and monitoring the patient's vital signs, pulse oximetry, telemetry and mental status throughout from the start of the procedure until the patient was taken to the recovery room. Ultrasound was used to evaluate the right common femoral artery. It was patent . A digital ultrasound image was acquired. A Seldinger needle was used to access the right common femoral artery under direct ultrasound guidance and a permanent image was performed. A 0.035 J wire was advanced without resistance and a 5Fr sheath was placed. Pigtail catheter was placed into the aorta and an AP aortogram was performed. This demonstrated normal renal arteries and normal aorta and iliac segments without significant stenosis. I then crossed the aortic bifurcation and advanced to the left femoral head. Selective left lower extremity angiogram was then performed. This demonstrated a diffusely diseased left SFA and popliteal artery with a short segment occlusion in the distal SFA and multiple areas of greater than 70% stenosis throughout both the SFA and above-knee popliteal arteries.  The tibioperoneal trunk was then heavily diseased with a greater than 80% stenosis with the posterior tibial is the only runoff distally.  This had a short segment occlusion in its distal segment as well. . The patient was systemically heparinized and a 6 [place] Ansell sheath was then placed over the [employer] wire. I then used a Kumpe catheter and the Glidewire to navigate through the SFA and popliteal stenoses and occlusions and confirm intraluminal flow in the below-knee popliteal artery.  I then exchanged for a 0.018 wire and a CXI catheter  and advanced down into the tibioperoneal trunk across the  stenosis there and into the posterior tibial artery.  Across the short segment occlusion of the distal posterior tibial artery with the CXI catheter and the 0.018 wire and parked the wire in the foot.  At this point, we proceeded with intervention.  A 2.5 mm diameter by 15 cm length angioplasty balloon was inflated in the distal posterior tibial artery and inflated to 12 atm for 1 minute.  It was then pulled up to the tibioperoneal trunk and proximal posterior tibial artery and inflated to 16 atm for 1 minute.  To address the SFA and popliteal lesions, drug-coated balloons were used.  From the below-knee popliteal artery up through Hunter's canal a 4 mm diameter 22 cm length Lutonix drug-coated angioplasty balloon was inflated up to 10 atm for 1 minute.  For the remainder of the SFA up to about 2-3 cm from the SFA origin, a 5 mm diameter by 22 cm length Lutonix drug-coated angioplasty balloon was used.  This was also inflated to 10 atm for 1 minute.  The SFA and popliteal arteries had several areas of residual greater than 50% stenosis as well as dissection in the mid SFA and at the reentry site and the above-knee popliteal artery.  I elected to treat these areas with a covered stent.  A 6 mm diameter by 25 cm length Viabahn stent was then deployed from the level of the knee up to the mid superficial femoral artery.  An additional 6 mm diameter by 10 cm length Viabahn stent was deployed in the proximal superficial femoral artery starting about 5 cm below the origin.  These were postdilated with 5 mm balloons with excellent angiographic completion result in less than 10% residual stenosis.  The tibioperoneal trunk had about a 20% residual stenosis which was not flow-limiting.  There is only about a 10% residual stenosis in the distal posterior tibial artery which was now brisk and continuous into the foot. I elected to terminate the procedure. The sheath was  removed and StarClose closure device was deployed in the right femoral artery with excellent hemostatic result. The patient was taken to the recovery room in stable condition having tolerated the procedure well.  Findings:  Aortogram:What appeared to be good flow through the renal arteries without obvious stenosis.  The aorta and iliac segments did not have any significant stenosis. Left lower Extremity:Diffusely diseased left SFA and popliteal artery with a short segment occlusion in the distal SFA and multiple areas of greater than 70% stenosis throughout both the SFA and above-knee popliteal arteries.  The tibioperoneal trunk was then heavily diseased with a greater than 80% stenosis with the posterior tibial is the only runoff distally.  This had a short segment occlusion in its distal segment as well.   Disposition: Patient was taken to the recovery room in stable condition having tolerated the procedure well.  Complications: None  Beth Mcguire 08/14/2017 11:20 AM   This note was created with Dragon Medical transcription system. Any errors in dictation are purely unintentional.

## 2017-08-14 NOTE — H&P (Signed)
[facility] History & Physical Update  The patient was interviewed and re-examined.  The patient's previous History and Physical has been reviewed and is unchanged.  There is no change in the plan of care. We plan to proceed with the scheduled procedure.  Leotis Pain, MD  08/14/2017, 8:09 AM

## 2017-08-15 ENCOUNTER — Encounter: Payer: Self-pay | Admitting: Vascular Surgery

## 2017-09-13 ENCOUNTER — Other Ambulatory Visit (INDEPENDENT_AMBULATORY_CARE_PROVIDER_SITE_OTHER): Payer: Self-pay | Admitting: Vascular Surgery

## 2017-09-13 DIAGNOSIS — I739 Peripheral vascular disease, unspecified: Secondary | ICD-10-CM

## 2017-09-18 ENCOUNTER — Encounter (INDEPENDENT_AMBULATORY_CARE_PROVIDER_SITE_OTHER): Payer: Self-pay | Admitting: Vascular Surgery

## 2017-09-18 ENCOUNTER — Ambulatory Visit (INDEPENDENT_AMBULATORY_CARE_PROVIDER_SITE_OTHER): Payer: Medicare Other

## 2017-09-18 ENCOUNTER — Ambulatory Visit (INDEPENDENT_AMBULATORY_CARE_PROVIDER_SITE_OTHER): Payer: Medicare Other | Admitting: Vascular Surgery

## 2017-09-18 VITALS — BP 195/70 | HR 71 | Resp 16 | Ht 64.0 in | Wt 185.0 lb

## 2017-09-18 DIAGNOSIS — E785 Hyperlipidemia, unspecified: Secondary | ICD-10-CM | POA: Diagnosis not present

## 2017-09-18 DIAGNOSIS — Z794 Long term (current) use of insulin: Secondary | ICD-10-CM

## 2017-09-18 DIAGNOSIS — E1151 Type 2 diabetes mellitus with diabetic peripheral angiopathy without gangrene: Secondary | ICD-10-CM

## 2017-09-18 DIAGNOSIS — I739 Peripheral vascular disease, unspecified: Secondary | ICD-10-CM

## 2017-09-18 NOTE — Progress Notes (Signed)
Subjective:    Patient ID: Beth Mcguire, female    DOB: 19-Sep-1941, 76 y.o.   MRN: [id number] Chief Complaint  Patient presents with  . Follow-up   Patient presents for her first post procedure follow-up.  The patient is status post a left lower extremity angiogram with intervention.  The patient's postprocedure course was unremarkable with exception of some swelling to the left lower extremity.  The patient notes her swelling has slowly improved.  The patient notes an improvement in the overall feeling of her left lower extremity.  The patient underwent a bilateral lower extremity ABI which was notable for moderate right lower extremity arterial occlusive disease.  No evidence of significant left lower extremity arterial disease.  Patient continues to see her podiatrist for wound care.  The patient denies any fever, nausea vomiting.   Review of Systems  Constitutional: Negative.   HENT: Negative.   Eyes: Negative.   Respiratory: Negative.   Cardiovascular: Positive for leg swelling.  Gastrointestinal: Negative.   Endocrine: Negative.   Genitourinary: Negative.   Musculoskeletal: Negative.   Skin: Negative.   Allergic/Immunologic: Negative.   Neurological: Negative.   Hematological: Negative.   Psychiatric/Behavioral: Negative.       Objective:   Physical Exam  Constitutional: She is oriented to person, place, and time. She appears well-developed and well-nourished. No distress.  HENT:  Head: Normocephalic and atraumatic.  Eyes: Conjunctivae are normal. Pupils are equal, round, and reactive to light.  Neck: Normal range of motion.  Cardiovascular: Normal rate, regular rhythm, normal heart sounds and intact distal pulses.  Pulses:      Radial pulses are 2+ on the right side, and 2+ on the left side.       Dorsalis pedis pulses are 1+ on the left side.       Posterior tibial pulses are 1+ on the left side.  Hard to palpate right pedal pulses however the foot is warm    Pulmonary/Chest: Effort normal and breath sounds normal.  Musculoskeletal: Normal range of motion. She exhibits edema (Mild to moderate left lower extremity edema.  Minimal right lower extremity edema).  Neurological: She is alert and oriented to person, place, and time.  Skin: Skin is warm and dry. She is not diaphoretic.  Psychiatric: She has a normal mood and affect. Her behavior is normal. Judgment and thought content normal.  Vitals reviewed.  BP (!) 195/70 (BP Location: Right Arm, Patient Position: Sitting)   Pulse 71   Resp 16   Ht '5\' 4"'  (1.626 m)   Wt 185 lb (83.9 kg)   BMI 31.76 kg/m   Past Medical History:  Diagnosis Date  . Anxiety   . Colon cancer ([place])   . Diabetes ([address])   . Hypertension   . Peripheral vascular disease ([place])    Social History   Socioeconomic History  . Marital status: Married    Spouse name: Not on file  . Number of children: Not on file  . Years of education: Not on file  . Highest education level: Not on file  Social Needs  . Financial resource strain: Not on file  . Food insecurity - worry: Not on file  . Food insecurity - inability: Not on file  . Transportation needs - medical: Not on file  . Transportation needs - non-medical: Not on file  Occupational History  . Not on file  Tobacco Use  . Smoking status: Never Smoker  . Smokeless tobacco: Never Used  Substance  and Sexual Activity  . Alcohol use: No    Alcohol/week: 0.0 oz  . Drug use: No  . Sexual activity: Not Currently  Other Topics Concern  . Not on file  Social History Narrative  . Not on file   Past Surgical History:  Procedure Laterality Date  . AMPUTATION Right 01/14/2016   Procedure: AMPUTATION RAY;  Surgeon: Albertine Patricia, DPM;  Location: [facility];  Service: Podiatry;  Laterality: Right;  . COLON SURGERY    . LOWER EXTREMITY ANGIOGRAPHY Left 08/14/2017   Procedure: Lower Extremity Angiography;  Surgeon: Algernon Huxley, MD;  Location: [place] [facility];   Service: Cardiovascular;  Laterality: Left;  . PERIPHERAL VASCULAR CATHETERIZATION N/A 01/05/2016   Procedure: Lower Extremity Angiography;  Surgeon: Algernon Huxley, MD;  Location: [place] [facility];  Service: Cardiovascular;  Laterality: N/A;  . PERIPHERAL VASCULAR CATHETERIZATION  01/05/2016   Procedure: Lower Extremity Intervention;  Surgeon: Algernon Huxley, MD;  Location: [place] [facility];  Service: Cardiovascular;;  . PERIPHERAL VASCULAR CATHETERIZATION Right 06/25/2016   Procedure: Lower Extremity Angiography;  Surgeon: Algernon Huxley, MD;  Location: [place] [facility];  Service: Cardiovascular;  Laterality: Right;  . PERIPHERAL VASCULAR CATHETERIZATION  06/25/2016   Procedure: Lower Extremity Intervention;  Surgeon: Algernon Huxley, MD;  Location: [place] [facility];  Service: Cardiovascular;;  . TONSILLECTOMY     Family History  Problem Relation Age of Onset  . Diabetes Other    No Known Allergies     Assessment & Plan:  Patient presents for her first post procedure follow-up.  The patient is status post a left lower extremity angiogram with intervention.  The patient's postprocedure course was unremarkable with exception of some swelling to the left lower extremity.  The patient notes her swelling has slowly improved.  The patient notes an improvement in the overall feeling of her left lower extremity.  The patient underwent a bilateral lower extremity ABI which was notable for moderate right lower extremity arterial occlusive disease.  No evidence of significant left lower extremity arterial disease.  Patient continues to see her podiatrist for wound care.  The patient denies any fever, nausea vomiting.  1. PAD (peripheral artery disease) (HCC) - Stable Improvement to left lower extremity arterial blood flow status post left lower extremity angiogram with intervention Patient has had a unremarkable postprocedure course Improvement to physical exam especially to the left lower  extremity Patient to follow-up in 6 months with ABI and left lower extremity arterial duplex Patient to call the office sooner if she should experience any increasing left lower extremity pain or wound development  - VAS [place] ABI WITH/WO TBI; Future - VAS [place] LOWER EXTREMITY ARTERIAL DUPLEX; Future  2. Type 2 diabetes mellitus with diabetic peripheral angiopathy without gangrene, with long-term current use of insulin (HCC) - Stable Encouraged good control as its slows the progression of atherosclerotic disease  3. Hyperlipidemia, unspecified hyperlipidemia type - Stable Encouraged good control as its slows the progression of atherosclerotic disease  Current Outpatient Medications on File Prior to Visit  Medication Sig Dispense Refill  . aspirin 81 MG tablet Take 1 tablet (81 mg total) by mouth daily.    Marland Kitchen atorvastatin (LIPITOR) 10 MG tablet Take 10 mg every evening by mouth.     . B-D ULTRAFINE III SHORT PEN 31G X 8 MM MISC   0  . Blood Glucose Monitoring Suppl (ONE TOUCH ULTRA MINI) w/Device KIT     . clopidogrel (  PLAVIX) 75 MG tablet Take 1 tablet (75 mg total) by mouth daily. 90 tablet 0  . glucose monitoring kit (FREESTYLE) monitoring kit 1 each by Does not apply route 4 (four) times daily -  before meals and at bedtime. 1 each 0  . Insulin Lispro Prot & Lispro (HUMALOG MIX 75/25 KWIKPEN) (75-25) 100 UNIT/ML Kwikpen Inject 25 Units 2 (two) times daily with a meal into the skin.     Marland Kitchen lisinopril (PRINIVIL,ZESTRIL) 20 MG tablet Take 20 mg by mouth daily.    . metFORMIN (GLUCOPHAGE-XR) 500 MG 24 hr tablet take 1 tablet by mouth once daily with DINNER  0  . ONE TOUCH ULTRA TEST test strip     . ONETOUCH DELICA LANCETS 66A MISC   0   No current facility-administered medications on file prior to visit.    There are no Patient Instructions on file for this visit. No Follow-up on file.  Rosaisela Jamroz A Zebulon Gantt, PA-C

## 2017-12-20 ENCOUNTER — Other Ambulatory Visit (INDEPENDENT_AMBULATORY_CARE_PROVIDER_SITE_OTHER): Payer: Self-pay | Admitting: Vascular Surgery

## 2018-03-21 ENCOUNTER — Ambulatory Visit (INDEPENDENT_AMBULATORY_CARE_PROVIDER_SITE_OTHER): Payer: Medicare HMO

## 2018-03-21 ENCOUNTER — Ambulatory Visit (INDEPENDENT_AMBULATORY_CARE_PROVIDER_SITE_OTHER): Payer: Medicare HMO | Admitting: Vascular Surgery

## 2018-03-21 ENCOUNTER — Encounter (INDEPENDENT_AMBULATORY_CARE_PROVIDER_SITE_OTHER): Payer: Self-pay | Admitting: Vascular Surgery

## 2018-03-21 VITALS — BP 176/69 | Resp 13 | Ht 64.0 in | Wt 166.0 lb

## 2018-03-21 DIAGNOSIS — Z794 Long term (current) use of insulin: Secondary | ICD-10-CM

## 2018-03-21 DIAGNOSIS — I10 Essential (primary) hypertension: Secondary | ICD-10-CM

## 2018-03-21 DIAGNOSIS — I739 Peripheral vascular disease, unspecified: Secondary | ICD-10-CM

## 2018-03-21 DIAGNOSIS — E1151 Type 2 diabetes mellitus with diabetic peripheral angiopathy without gangrene: Secondary | ICD-10-CM

## 2018-03-21 DIAGNOSIS — E785 Hyperlipidemia, unspecified: Secondary | ICD-10-CM

## 2018-03-21 NOTE — Patient Instructions (Signed)

## 2018-03-21 NOTE — Assessment & Plan Note (Signed)
Noninvasive studies today do demonstrate 50 to 74% stenosis in the left SFA stent with some drop in her digital pressure although her digital waveforms remain pretty good.  Her left leg is noncompressible.  Her right ABI has had a slight drop down to 0.66.  Currently, she really does not have much in the way of symptoms.  No intervention will be planned at this time.  Continue antiplatelet therapy and statin agent.  Recheck in 6 months.

## 2018-03-21 NOTE — Progress Notes (Signed)
MRN : [id number]  Beth Mcguire is a 77 y.o. (1940/12/17) female who presents with chief complaint of  Chief Complaint  Patient presents with  . Follow-up    6 month ABI and arterial  .  History of Present Illness: Patient returns today in follow up of a.  She underwent left lower extremity revascularization last fall for limb salvage with an open ulceration.  Her ulcer has healed and she denies any ischemic rest pain, new ulceration, or lifestyle limiting claudication.  Noninvasive studies today do demonstrate 50 to 74% stenosis in the left SFA stent with some drop in her digital pressure although her digital waveforms remain pretty good.  Her left leg is noncompressible.  Her right ABI has had a slight drop down to 0.66.  She has previously had right leg intervention as well.  Current Outpatient Medications  Medication Sig Dispense Refill  . aspirin 81 MG tablet Take 1 tablet (81 mg total) by mouth daily.    Marland Kitchen atorvastatin (LIPITOR) 10 MG tablet Take 10 mg every evening by mouth.     . clopidogrel (PLAVIX) 75 MG tablet TAKE 1 TABLET BY MOUTH ONCE DAILY 30 tablet 6  . Insulin Lispro Prot & Lispro (HUMALOG MIX 75/25 KWIKPEN) (75-25) 100 UNIT/ML Kwikpen Inject 25 Units 2 (two) times daily with a meal into the skin.     Marland Kitchen lisinopril (PRINIVIL,ZESTRIL) 20 MG tablet Take 20 mg by mouth daily.    . metFORMIN (GLUCOPHAGE-XR) 500 MG 24 hr tablet take 1 tablet by mouth once daily with DINNER  0  . B-D ULTRAFINE III SHORT PEN 31G X 8 MM MISC   0  . Blood Glucose Monitoring Suppl (ONE TOUCH ULTRA MINI) w/Device KIT     . glucose monitoring kit (FREESTYLE) monitoring kit 1 each by Does not apply route 4 (four) times daily -  before meals and at bedtime. (Patient not taking: Reported on 03/21/2018) 1 each 0  . ONE TOUCH ULTRA TEST test strip     . ONETOUCH DELICA LANCETS 50I MISC   0   No current facility-administered medications for this visit.     Past Medical History:  Diagnosis Date  .  Anxiety   . Colon cancer ([place])   . Diabetes ([place])   . Hypertension   . Peripheral vascular disease [facility])     Past Surgical History:  Procedure Laterality Date  . AMPUTATION Right 01/14/2016   Procedure: AMPUTATION RAY;  Surgeon: Albertine Patricia, DPM;  Location: [facility];  Service: Podiatry;  Laterality: Right;  . COLON SURGERY    . LOWER EXTREMITY ANGIOGRAPHY Left 08/14/2017   Procedure: Lower Extremity Angiography;  Surgeon: Algernon Huxley, MD;  Location: [place] [facility];  Service: Cardiovascular;  Laterality: Left;  . PERIPHERAL VASCULAR CATHETERIZATION N/A 01/05/2016   Procedure: Lower Extremity Angiography;  Surgeon: Algernon Huxley, MD;  Location: [place] [facility];  Service: Cardiovascular;  Laterality: N/A;  . PERIPHERAL VASCULAR CATHETERIZATION  01/05/2016   Procedure: Lower Extremity Intervention;  Surgeon: Algernon Huxley, MD;  Location: [place] [facility];  Service: Cardiovascular;;  . PERIPHERAL VASCULAR CATHETERIZATION Right 06/25/2016   Procedure: Lower Extremity Angiography;  Surgeon: Algernon Huxley, MD;  Location: [place] [facility];  Service: Cardiovascular;  Laterality: Right;  . PERIPHERAL VASCULAR CATHETERIZATION  06/25/2016   Procedure: Lower Extremity Intervention;  Surgeon: Algernon Huxley, MD;  Location: [place] [facility];  Service: Cardiovascular;;  . TONSILLECTOMY  Social History  Substance Use Topics  . Smoking status: Never Smoker  . Smokeless tobacco: Not on file  . Alcohol use Not on file  No IV drug use Married. Lives with spouse  Family History      Family History  Problem Relation Age of Onset  . Diabetes Other   No aneurysms or porphyria No bleeding or clotting disorders  No Known Allergies   REVIEW OF SYSTEMS(Negative unless checked)  Constitutional: _0 Weight loss_1 Fever_2 Chills Cardiac:_3 Chest pain_4 Chest pressure_5 Palpitations _6 Shortness of breath when laying flat _7 Shortness of breath at rest _8 Shortness of  breath with exertion. Vascular: _9 Pain in legs with walking_10 Pain in legsat rest_11 Pain in legs when laying flat _12 Claudication _13 Pain in feet when walking _14 Pain in feet at rest _15 Pain in feet when laying flat _16 History of DVT _17 Phlebitis _18 Swelling in legs _19 Varicose veins _20 Non-healing ulcers Pulmonary: _21 Uses home oxygen _22 Productive cough_23 Hemoptysis _24 Wheeze _25 COPD _26 Asthma Neurologic: _27 Dizziness _28 Blackouts _29 Seizures _30 History of stroke _31 History of TIA_32 Aphasia _33 Temporary blindness_34 Dysphagia _35 Weaknessor numbness in arms _36 Weakness or numbnessin legs Musculoskeletal: _37 Arthritis _38 Joint swelling _39 Joint pain _40 Low back pain Hematologic:_41 Easy bruising_42 Easy bleeding _43 Hypercoagulable state _44 Anemic  Gastrointestinal:_45 Blood in stool_46 Vomiting blood_47 Gastroesophageal reflux/heartburn_48 Abdominal pain Genitourinary: _49 Chronic kidney disease _50 Difficulturination _51 Frequenturination _52 Burning with urination_53 Hematuria Skin: _54 Rashes _55 Ulcers _56 Wounds Psychological: _57 History of anxiety_58 History of major depression.      Physical Examination  BP (!) 176/69 (BP Location: Left Arm, Patient Position: Sitting)   Resp 13   Ht _59  (1.626 m)   Wt 166 lb (75.3 kg)   BMI 28.49 kg/m  Gen:  WD/WN, NAD Head: [place]/AT, No temporalis wasting. Ear/Nose/Throat: Hearing grossly intact, nares w/o erythema or drainage Eyes: Conjunctiva clear. Sclera non-icteric Neck: Supple.  Trachea midline Pulmonary:  Good air movement, no use of accessory muscles.  Cardiac: RRR, no JVD Vascular:  Vessel Right Left  Radial Palpable Palpable                          PT 1+ Palpable Palpable  DP Trace Palpable Not Palpable    Musculoskeletal: M/S 5/5 throughout.  No deformity or atrophy. 1+ BLE edema. Neurologic: Sensation grossly intact in extremities.  Symmetrical.  Speech is fluent.    Psychiatric: Judgment intact, Mood & affect appropriate for pt's clinical situation. Dermatologic: No rashes or ulcers noted.  No cellulitis or open wounds.       Labs No results found for this or any previous visit (from the past 2160 hour(s)).  Radiology No results found.  Assessment/Plan Diabetes (HCC) blood glucose control important in reducing the progression of atherosclerotic disease. Also, involved in wound healing. On appropriate medications.   Essential hypertension blood pressure control important in reducing the progression of atherosclerotic disease. On appropriate oral medications.   Hyperlipidemia lipid control important in reducing the progression of atherosclerotic disease. Continue statin therapy   PAD (peripheral artery disease) (HCC) Noninvasive studies today do demonstrate 50 to 74% stenosis in the left SFA stent with some drop in her digital pressure although her digital waveforms remain pretty good.  Her left leg is noncompressible.  Her right ABI has had a slight drop down to 0.66.  Currently, she really does not have much in the way of symptoms.  No intervention will be planned at this time.  Continue antiplatelet therapy and statin agent.  Recheck in 6 months.    Leotis Pain, MD  03/21/2018 1:07 PM    This note was created with Dragon medical transcription system.  Any errors from dictation are purely unintentional

## 2018-06-05 ENCOUNTER — Other Ambulatory Visit (INDEPENDENT_AMBULATORY_CARE_PROVIDER_SITE_OTHER): Payer: Self-pay | Admitting: Vascular Surgery

## 2018-09-30 ENCOUNTER — Encounter (INDEPENDENT_AMBULATORY_CARE_PROVIDER_SITE_OTHER): Payer: Medicare Other

## 2018-09-30 ENCOUNTER — Ambulatory Visit (INDEPENDENT_AMBULATORY_CARE_PROVIDER_SITE_OTHER): Payer: Medicare Other | Admitting: Vascular Surgery

## 2018-10-03 ENCOUNTER — Ambulatory Visit (INDEPENDENT_AMBULATORY_CARE_PROVIDER_SITE_OTHER): Payer: Medicare HMO

## 2018-10-03 ENCOUNTER — Ambulatory Visit (INDEPENDENT_AMBULATORY_CARE_PROVIDER_SITE_OTHER): Payer: Medicare HMO | Admitting: Vascular Surgery

## 2018-10-03 ENCOUNTER — Other Ambulatory Visit: Payer: Self-pay

## 2018-10-03 ENCOUNTER — Encounter (INDEPENDENT_AMBULATORY_CARE_PROVIDER_SITE_OTHER): Payer: Self-pay | Admitting: Vascular Surgery

## 2018-10-03 VITALS — BP 178/71 | HR 71 | Resp 16 | Ht 62.0 in | Wt 164.0 lb

## 2018-10-03 DIAGNOSIS — E1151 Type 2 diabetes mellitus with diabetic peripheral angiopathy without gangrene: Secondary | ICD-10-CM | POA: Diagnosis not present

## 2018-10-03 DIAGNOSIS — I739 Peripheral vascular disease, unspecified: Secondary | ICD-10-CM

## 2018-10-03 DIAGNOSIS — I10 Essential (primary) hypertension: Secondary | ICD-10-CM

## 2018-10-03 DIAGNOSIS — Z794 Long term (current) use of insulin: Secondary | ICD-10-CM

## 2018-10-03 DIAGNOSIS — E785 Hyperlipidemia, unspecified: Secondary | ICD-10-CM

## 2018-10-03 DIAGNOSIS — Z95820 Peripheral vascular angioplasty status with implants and grafts: Secondary | ICD-10-CM

## 2018-10-03 NOTE — Progress Notes (Signed)
MRN : [id number]  Beth Mcguire is a 77 y.o. (1941-09-11) female who presents with chief complaint of  Chief Complaint  Patient presents with  . PAD  .  History of Present Illness: Patient returns today in follow up of her PAD.  She has undergone right great toe amputation which has subsequently healed.  She is not currently having any rest pain symptoms.  No recent ulceration or infection. Noninvasive studies performed today shows stable right ABI of 0.66 and a left ABI of 1.20.  Her digital pressure is 72 on the right and 93 on the left.  Current Outpatient Medications  Medication Sig Dispense Refill  . aspirin 81 MG tablet Take 1 tablet (81 mg total) by mouth daily.    Marland Kitchen atorvastatin (LIPITOR) 10 MG tablet Take 10 mg every evening by mouth.     . B-D ULTRAFINE III SHORT PEN 31G X 8 MM MISC   0  . Blood Glucose Monitoring Suppl (ONE TOUCH ULTRA MINI) w/Device KIT     . clopidogrel (PLAVIX) 75 MG tablet TAKE 1 TABLET BY MOUTH EVERY DAY 90 tablet 5  . glucose monitoring kit (FREESTYLE) monitoring kit 1 each by Does not apply route 4 (four) times daily -  before meals and at bedtime. 1 each 0  . Insulin Lispro Prot & Lispro (HUMALOG MIX 75/25 KWIKPEN) (75-25) 100 UNIT/ML Kwikpen Inject 25 Units 2 (two) times daily with a meal into the skin.     Marland Kitchen lisinopril (PRINIVIL,ZESTRIL) 20 MG tablet Take 20 mg by mouth daily.    . metFORMIN (GLUCOPHAGE-XR) 500 MG 24 hr tablet take 1 tablet by mouth once daily with DINNER  0  . ONE TOUCH ULTRA TEST test strip     . ONETOUCH DELICA LANCETS 78G MISC   0  . QUEtiapine (SEROQUEL) 25 MG tablet 25 mg daily.     No current facility-administered medications for this visit.     Past Medical History:  Diagnosis Date  . Anxiety   . Colon cancer ([place])   . Diabetes ([place])   . Hypertension   . Peripheral vascular disease [facility])     Past Surgical History:  Procedure Laterality Date  . AMPUTATION Right 01/14/2016   Procedure: AMPUTATION RAY;  Surgeon:  Albertine Patricia, DPM;  Location: [facility];  Service: Podiatry;  Laterality: Right;  . COLON SURGERY    . LOWER EXTREMITY ANGIOGRAPHY Left 08/14/2017   Procedure: Lower Extremity Angiography;  Surgeon: Algernon Huxley, MD;  Location: [place] [facility];  Service: Cardiovascular;  Laterality: Left;  . PERIPHERAL VASCULAR CATHETERIZATION N/A 01/05/2016   Procedure: Lower Extremity Angiography;  Surgeon: Algernon Huxley, MD;  Location: [place] [facility];  Service: Cardiovascular;  Laterality: N/A;  . PERIPHERAL VASCULAR CATHETERIZATION  01/05/2016   Procedure: Lower Extremity Intervention;  Surgeon: Algernon Huxley, MD;  Location: [place] [facility];  Service: Cardiovascular;;  . PERIPHERAL VASCULAR CATHETERIZATION Right 06/25/2016   Procedure: Lower Extremity Angiography;  Surgeon: Algernon Huxley, MD;  Location: [place] [facility];  Service: Cardiovascular;  Laterality: Right;  . PERIPHERAL VASCULAR CATHETERIZATION  06/25/2016   Procedure: Lower Extremity Intervention;  Surgeon: Algernon Huxley, MD;  Location: [place] [facility];  Service: Cardiovascular;;  . TONSILLECTOMY     Social History  Substance Use Topics  . Smoking status: Never Smoker  . Smokeless tobacco: Not on file  . Alcohol use Not on file  No IV drug use Married. Lives with  spouse  Family History      Family History  Problem Relation Age of Onset  . Diabetes Other   No aneurysms or porphyria No bleeding or clotting disorders  No Known Allergies   REVIEW OF SYSTEMS(Negative unless checked)  Constitutional: _0 ?Weight loss_1 ?Fever_2 ?Chills Cardiac:_3 ?Chest pain_4 ?Chest pressure_5 ?Palpitations _6 ?Shortness of breath when laying flat _7 ?Shortness of breath at rest _8 ?Shortness of breath with exertion. Vascular: _9 ?Pain in legs with walking_10 ?Pain in legsat rest_11 ?Pain in legs when laying flat _12 ?Claudication _13 ?Pain in feet when walking _14 ?Pain in feet at rest _15 ?Pain in feet when laying  flat _16 ?History of DVT _17 ?Phlebitis _18 ?Swelling in legs _19 ?Varicose veins _20 ?Non-healing ulcers Pulmonary: _21 ?Uses home oxygen _22 ?Productive cough_23 ?Hemoptysis _24 ?Wheeze _25 ?COPD _26 ?Asthma Neurologic: _27 ?Dizziness _28 ?Blackouts _29 ?Seizures _30 ?History of stroke _31 ?History of TIA_32 ?Aphasia _33 ?Temporary blindness_34 ?Dysphagia _35 ?Weaknessor numbness in arms _36 ?Weakness or numbnessin legs Musculoskeletal: _37 ?Arthritis _38 ?Joint swelling _39 ?Joint pain _40 ?Low back pain Hematologic:_41 ?Easy bruising_42 ?Easy bleeding _43 ?Hypercoagulable state _44 ?Anemic  Gastrointestinal:_45 ?Blood in stool_46 ?Vomiting blood_47 ?Gastroesophageal reflux/heartburn_48 ?Abdominal pain Genitourinary: _49 ?Chronic kidney disease _50 ?Difficulturination _51 ?Frequenturination _52 ?Burning with urination_53 ?Hematuria Skin: _54 ?Rashes _55 ?Ulcers _56 ?Wounds Psychological: _57 ?History of anxiety_58 ?History of major depression.    Physical Examination  BP (!) 178/71 (BP Location: Left Arm, Patient Position: Sitting)   Pulse 71   Resp 16   Ht _59  (1.575 m)   Wt 164 lb (74.4 kg)   BMI 30.00 kg/m  Gen:  WD/WN, NAD Head: [place]/AT, No temporalis wasting. Ear/Nose/Throat: Hearing grossly intact, nares w/o erythema or drainage Eyes: Conjunctiva clear. Sclera non-icteric Neck: Supple.  Trachea midline Pulmonary:  Good air movement, no use of accessory muscles.  Cardiac: RRR, no JVD Vascular:  Vessel Right Left  Radial Palpable Palpable                          PT Not Palpable 1+ Palpable  DP 1+ Palpable 2+ Palpable    Musculoskeletal: M/S 5/5 throughout.  No deformity or atrophy. 1+ BLE edema.  Previous amputation site of the great toe has healed without open wounds or infections. Neurologic: Sensation grossly intact in extremities.  Symmetrical.  Speech is fluent.  Psychiatric: Judgment intact, Mood & affect appropriate for pt's clinical  situation. Dermatologic: No rashes or ulcers noted.  No cellulitis or open wounds.       Labs No results found for this or any previous visit (from the past 2160 hour(s)).  Radiology No results found.  Assessment/Plan Diabetes (HCC) blood glucose control important in reducing the progression of atherosclerotic disease. Also, involved in wound healing. On appropriate medications.   Essential hypertension blood pressure control important in reducing the progression of atherosclerotic disease. On appropriate oral medications.   Hyperlipidemia lipid control important in reducing the progression of atherosclerotic disease. Continue statin therapy  PAD (peripheral artery disease) ([place]) Noninvasive studies performed today shows stable right ABI of 0.66 and a left ABI of 1.20.  Her digital pressure is 72 on the right and 93 on the left.  Perfusion is currently stable without any limb threatening issues.  Continue her current medical regimen.  Return in follow-up in 6 months with noninvasive studies.    Leotis Pain, MD  10/03/2018 4:10 PM    This note was created with Dragon medical transcription system.  Any errors from dictation are purely unintentional

## 2018-10-03 NOTE — Patient Instructions (Signed)
Peripheral Vascular Disease  Peripheral vascular disease (PVD) is a disease of the blood vessels that are not part of your heart and brain. A simple term for PVD is poor circulation. In most cases, PVD narrows the blood vessels that carry blood from your heart to the rest of your body. This can reduce the supply of blood to your arms, legs, and internal organs, like your stomach or kidneys. However, PVD most often affects a person's lower legs and feet. Without treatment, PVD tends to get worse. PVD can also lead to acute ischemic limb. This is when an arm or leg suddenly cannot get enough blood. This is a medical emergency. Follow these instructions at home: Lifestyle  Do not use any products that contain nicotine or tobacco, such as cigarettes and e-cigarettes. If you need help quitting, ask your doctor.  Lose weight if you are overweight. Or, stay at a healthy weight as told by your doctor.  Eat a diet that is low in fat and cholesterol. If you need help, ask your doctor.  Exercise regularly. Ask your doctor for activities that are right for you. General instructions  Take over-the-counter and prescription medicines only as told by your doctor.  Take good care of your feet: ? Wear comfortable shoes that fit well. ? Check your feet often for any cuts or sores.  Keep all follow-up visits as told by your doctor This is important. Contact a doctor if:  You have cramps in your legs when you walk.  You have leg pain when you are at rest.  You have coldness in a leg or foot.  Your skin changes.  You are unable to get or have an erection (erectile dysfunction).  You have cuts or sores on your feet that do not heal. Get help right away if:  Your arm or leg turns cold, numb, and blue.  Your arms or legs become red, warm, swollen, painful, or numb.  You have chest pain.  You have trouble breathing.  You suddenly have weakness in your face, arm, or leg.  You become very  confused or you cannot speak.  You suddenly have a very bad headache.  You suddenly cannot see. Summary  Peripheral vascular disease (PVD) is a disease of the blood vessels.  A simple term for PVD is poor circulation. Without treatment, PVD tends to get worse.  Treatment may include exercise, low fat and low cholesterol diet, and quitting smoking. This information is not intended to replace advice given to you by your health care provider. Make sure you discuss any questions you have with your health care provider. Document Released: 12/19/2009 Document Revised: 11/01/2016 Document Reviewed: 11/01/2016 Elsevier Interactive Patient Education  2019 Elsevier Inc.  

## 2018-10-03 NOTE — Assessment & Plan Note (Signed)
Noninvasive studies performed today shows stable right ABI of 0.66 and a left ABI of 1.20.  Her digital pressure is 72 on the right and 93 on the left.  Perfusion is currently stable without any limb threatening issues.  Continue her current medical regimen.  Return in follow-up in 6 months with noninvasive studies.

## 2019-04-07 ENCOUNTER — Ambulatory Visit (INDEPENDENT_AMBULATORY_CARE_PROVIDER_SITE_OTHER): Payer: Medicare HMO | Admitting: Vascular Surgery

## 2019-04-07 ENCOUNTER — Encounter (INDEPENDENT_AMBULATORY_CARE_PROVIDER_SITE_OTHER): Payer: Medicare HMO

## 2019-08-20 ENCOUNTER — Other Ambulatory Visit (INDEPENDENT_AMBULATORY_CARE_PROVIDER_SITE_OTHER): Payer: Self-pay | Admitting: Vascular Surgery

## 2020-03-08 ENCOUNTER — Other Ambulatory Visit: Payer: Self-pay

## 2020-03-08 ENCOUNTER — Emergency Department
Admission: EM | Admit: 2020-03-08 | Discharge: 2020-03-08 | Disposition: A | Discharge status: Home / Self Care | Payer: Medicare HMO | Attending: Emergency Medicine | Admitting: Emergency Medicine

## 2020-03-08 DIAGNOSIS — K0889 Other specified disorders of teeth and supporting structures: Secondary | ICD-10-CM | POA: Diagnosis present

## 2020-03-08 DIAGNOSIS — K029 Dental caries, unspecified: Secondary | ICD-10-CM | POA: Diagnosis not present

## 2020-03-08 DIAGNOSIS — E119 Type 2 diabetes mellitus without complications: Secondary | ICD-10-CM | POA: Diagnosis not present

## 2020-03-08 DIAGNOSIS — K0381 Cracked tooth: Secondary | ICD-10-CM | POA: Diagnosis not present

## 2020-03-08 DIAGNOSIS — Z794 Long term (current) use of insulin: Secondary | ICD-10-CM | POA: Diagnosis not present

## 2020-03-08 DIAGNOSIS — K047 Periapical abscess without sinus: Secondary | ICD-10-CM

## 2020-03-08 MED ORDER — TRAMADOL HCL 50 MG PO TABS: 50.0000 mg | ORAL_TABLET | Freq: Two times a day (BID) | ORAL | 0 refills | Status: DC | PRN

## 2020-03-08 MED ORDER — TRAMADOL HCL 50 MG PO TABS
50.0000 mg | ORAL_TABLET | Freq: Once | ORAL | Status: AC
  Administered 2020-03-08: 50 mg via ORAL
  Filled 2020-03-08: qty 1

## 2020-03-08 MED ORDER — LIDOCAINE VISCOUS HCL 2 % MT SOLN: 5.0000 mL | Freq: Four times a day (QID) | OROMUCOSAL | 0 refills | Status: DC | PRN

## 2020-03-08 MED ORDER — LIDOCAINE VISCOUS HCL 2 % MT SOLN
15.0000 mL | Freq: Once | OROMUCOSAL | Status: AC
  Administered 2020-03-08: 15 mL via OROMUCOSAL
  Filled 2020-03-08: qty 15

## 2020-03-08 NOTE — ED Notes (Signed)
Sent a rainbow with lab.

## 2020-03-08 NOTE — Discharge Instructions (Addendum)
Follow discharge care instructions.  Advised to follow-up with treating dentist or from list provided in the discharge care instructions.  OPTIONS FOR DENTAL FOLLOW UP CARE  [facility] and [place] [email or web address] [phone])  [facility] [phone])  [place] [phone])  [place] [phone])  [facility] [phone]) This clinic caters to the indigent population and is on a lottery system. Location: [employer] [facility], [place], [place], [place] Clinic Hours: Wednesdays from 6pm - 9pm, patients seen by a lottery system. For dates, call or go to [email or web address] Services: Cleanings, fillings and simple extractions. Payment Options: DENTAL WORK IS FREE OF CHARGE. Bring proof of income or support. Best way to get seen: Arrive at 5:15 pm - this is a lottery, NOT first come/first serve, so arriving earlier will not increase your chances of being seen.     [facility] [phone] Select option 1 for emergencies   Location: [facility], [place], [address], [place] Clinic Hours: No walk-ins accepted - call the day before to schedule an appointment. Check in times are 9:30 am and 1:30 pm. Services: Simple extractions, temporary fillings, pulpectomy/pulp debridement, uncomplicated abscess drainage. Payment Options: PAYMENT IS DUE AT THE TIME OF SERVICE.  Fee is usually $100-200, additional surgical procedures (e.g. abscess drainage) may be extra. Cash, checks, Visa/MasterCard accepted.  Can file Medicaid if patient is covered for dental - patient should call case worker to check. No discount for [facility] patients. Best way to get seen: MUST call the day before and get  onto the schedule. Can usually be seen the next 1-2 days. No walk-ins accepted.     [place] [phone]   Location: [place], [place] Clinic Hours: M, W, Th, F 8am or 1:30pm, Tues 9a or 1:30 - first come/first served. Services: Simple extractions, temporary fillings, uncomplicated abscess drainage.  You do not need to be an [facility] resident. Payment Options: PAYMENT IS DUE AT THE TIME OF SERVICE. Dental insurance, otherwise sliding scale - bring proof of income or support. Depending on income and treatment needed, cost is usually $50-200. Best way to get seen: Arrive early as it is first come/first served.     [facility] [phone]   Location: [place] [facility] Hours: Mon-Thu 8a-5p Services: Most basic dental services including extractions and fillings. Payment Options: PAYMENT IS DUE AT THE TIME OF SERVICE. Sliding scale, up to 50% off - bring proof if income or support. Medicaid with dental option accepted. Best way to get seen: Call to schedule an appointment, can usually be seen within 2 weeks OR they will try to see walk-ins - show up at [place] or 2p (you may have to wait).     [facility] [place] RESIDENTS ONLY   Location: [facility], [place] [address], [place], [place] [address] Clinic Hours: By appointment only. Monday - Thursday 8am-5pm, Friday 8am-12pm Services: Cleanings, fillings, extractions. Payment Options: PAYMENT IS DUE AT THE TIME OF SERVICE. Cash, Visa or MasterCard. Sliding scale - $30 minimum per service. Best way to get seen: Come in to office, complete packet and make an appointment - need proof of income or support monies for each household member and proof of [facility] residence. Usually takes about  a month to get in.     [facility] [phone]   Location: [address] [place] Clinic Hours: Walk-in Urgent Care Dental Services are offered Monday-Friday mornings only. The numbers of emergencies accepted daily is limited to the number of providers available. Maximum 15 - Mondays, Wednesdays & Thursdays Maximum 10 - Tuesdays & Fridays Services: You do not need to be a [facility] resident to be seen for a dental emergency. Emergencies are defined as pain, swelling, abnormal bleeding, or dental trauma. Walkins will receive x-rays if needed. NOTE: Dental cleaning is not an emergency. Payment Options: PAYMENT IS DUE AT THE TIME OF SERVICE. Minimum co-pay is $40.00 for uninsured patients. Minimum co-pay is $3.00 for Medicaid with dental coverage. Dental Insurance is accepted and must be presented at time of visit. Medicare does not cover dental. Forms of payment: Cash, credit card, checks. Best way to get seen: If not previously registered with the clinic, walk-in dental registration begins at 7:15 am and is on a first come/first serve basis. If previously registered with the clinic, call to make an appointment.     [facility] [place] ONLY   Location: [address], [place], [place] Clinic Hours: Mon-Thu 10a-2p Services: Extractions only! Payment Options: FREE (donations accepted) - bring proof of income or support Best way to get seen: Call and schedule an appointment OR come at 8am on the 1st Monday of every month (except for holidays) when it is first come/first served.     [facility] [phone]   Location: [place], [place] Clinic Hours: Friday mornings Services, Payment Options, Best way to get seen: Call for info

## 2020-03-08 NOTE — ED Provider Notes (Signed)
[facility] Emergency Department Provider Note   ____________________________________________   First MD Initiated Contact with Patient 03/08/20 1118     (approximate)  I have reviewed the triage vital signs and the nursing notes.   HISTORY  Chief Complaint Dental Pain    HPI Beth Mcguire is a 79 y.o. female patient complain left upper tooth pain which started upon a.m. awakening.  Patient has multiple dental caries and fractured teeth.  Patient not seen a dentist in many years.  Patient rates her pain as 8/10.  Patient described the pain is "achy".  No palliative measure for complaint.      Past Medical History:  Diagnosis Date  . Anxiety   . Colon cancer ([place])   . Diabetes ([place])   . Hypertension   . Peripheral vascular disease [facility])     Patient Active Problem List   Diagnosis Date Noted  . PAD (peripheral artery disease) ([place]) 09/18/2017  . Pure hypercholesterolemia 08/13/2017  . Hyperlipidemia 07/25/2016  . Essential hypertension 07/25/2016  . Diabetes ([place]) 07/25/2016  . 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 Foot osteomyelitis, right ([place]) 01/12/2016  . Osteomyelitis ([place]) 01/12/2016  . 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 AMPUTATION Right 01/14/2016   Procedure: AMPUTATION RAY;  Surgeon: Albertine Patricia, DPM;  Location: [facility] ORS;  Service: Podiatry;  Laterality: Right;  . COLON SURGERY    . LOWER EXTREMITY ANGIOGRAPHY Left 08/14/2017   Procedure: Lower Extremity Angiography;  Surgeon: Algernon Huxley, MD;  Location: [facility];  Service: Cardiovascular;  Laterality: Left;  . PERIPHERAL VASCULAR CATHETERIZATION N/A 01/05/2016   Procedure: Lower Extremity Angiography;  Surgeon: Algernon Huxley, MD;  Location: [place] [facility];  Service: Cardiovascular;  Laterality: N/A;  . PERIPHERAL VASCULAR CATHETERIZATION  01/05/2016   Procedure: Lower Extremity Intervention;  Surgeon: Algernon Huxley, MD;  Location: [place] [facility];  Service: Cardiovascular;;  . PERIPHERAL VASCULAR CATHETERIZATION Right 06/25/2016   Procedure: Lower Extremity Angiography;  Surgeon: Algernon Huxley, MD;  Location: [place] [facility];  Service: Cardiovascular;  Laterality: Right;  . PERIPHERAL VASCULAR CATHETERIZATION  06/25/2016   Procedure: Lower Extremity Intervention;  Surgeon: Algernon Huxley, MD;  Location: [place] [facility];  Service: Cardiovascular;;  . TONSILLECTOMY      Prior to Admission medications   Medication Sig Start Date End Date Taking? Authorizing Provider  aspirin 81 MG tablet Take 1 tablet (81 mg total) by mouth daily. 01/06/16   Hillary Bow, MD  atorvastatin (LIPITOR) 10 MG tablet Take 10 mg every evening by mouth.  02/08/16   [provider]  B-D ULTRAFINE III SHORT PEN 31G X 8 MM MISC  08/15/17   [provider]  Blood Glucose Monitoring Suppl (ONE TOUCH ULTRA MINI) w/Device KIT  07/23/16   [provider]  clopidogrel (PLAVIX) 75 MG tablet TAKE 1 TABLET BY MOUTH EVERY DAY 08/20/19   Kris Hartmann, NP  glucose monitoring kit (FREESTYLE) monitoring kit 1 each by Does not apply route 4 (four) times daily -  before meals and at bedtime. 01/06/16   Hillary Bow, MD  Insulin Lispro Prot & Lispro (HUMALOG MIX 75/25 KWIKPEN) (75-25) 100 UNIT/ML Kwikpen Inject 25 Units 2 (two) times daily with a meal into the skin.  01/02/16   [provider]  lidocaine (XYLOCAINE) 2 % solution Use as  directed 5 mLs in the mouth or throat every 6 (six) hours as needed for mouth pain. Oral swish 03/08/20   Sable Feil, PA-C  lisinopril (PRINIVIL,ZESTRIL) 20 MG tablet Take 20 mg by mouth daily.    [provider]  metFORMIN (GLUCOPHAGE-XR) 500 MG 24 hr tablet take 1 tablet by mouth once daily with DINNER 08/15/17   [provider]  ONE TOUCH ULTRA TEST test strip  07/23/16   [provider]  Jonetta Speak LANCETS 72C [place]  08/30/17   [provider]  QUEtiapine (SEROQUEL) 25 MG tablet 25 mg daily. 09/08/18   [provider]  traMADol (ULTRAM) 50 MG tablet Take 1 tablet (50 mg total) by mouth every 12 (twelve) hours as needed. 03/08/20   Sable Feil, PA-C    Allergies Patient has no known allergies.  Family History  Problem Relation Age of Onset  . Diabetes Other     Social History Social History   Tobacco Use  . Smoking status: Never Smoker  . Smokeless tobacco: Never Used  Substance Use Topics  . Alcohol use: No    Alcohol/week: 0.0 standard drinks  . Drug use: No    Review of Systems  Constitutional: No fever/chills Eyes: No visual changes. ENT: No sore throat. Cardiovascular: Denies chest pain. Respiratory: Denies shortness of breath. Gastrointestinal: No abdominal pain.  No nausea, no vomiting.  No diarrhea.  No constipation. Genitourinary: Negative for dysuria. Musculoskeletal: Negative for back pain. Skin: Negative for rash. Neurological: Negative for headaches, focal weakness or numbness. Psychiatric:  Anxiety Endocrine: Diabetes and hypertension  ____________________________________________   PHYSICAL EXAM:  VITAL SIGNS: ED Triage Vitals  Enc Vitals Group     BP 03/08/20 1042 (!) 188/46     Pulse Rate 03/08/20 1042 69     Resp 03/08/20 1042 18     Temp 03/08/20 1042 98.8 F (37.1 C)     Temp Source 03/08/20 1042 Oral     SpO2 03/08/20 1042 100 %     Weight 03/08/20 1045 165 lb (74.8 kg)     Height 03/08/20 1044 _0  (1.575 m)     Head Circumference --      Peak Flow --      Pain Score 03/08/20 1044 8     Pain Loc --      Pain Edu? --      Excl. in [place]? --    Constitutional: Alert and oriented. Well appearing and in no acute distress. Eyes: Conjunctivae are normal. PERRL. EOMI. Head: Atraumatic. Nose: No congestion/rhinnorhea. Mouth/Throat: Mucous membranes are moist.  Oropharynx non-erythematous.   Multiple caries and fractured tooth.  Overall poor dentition. Hematological/Lymphatic/Immunilogical: No cervical lymphadenopathy. Cardiovascular: Normal rate, regular rhythm. Grossly normal heart sounds.  Good peripheral circulation.  Elevated blood pressure. Respiratory: Normal respiratory effort.  No retractions. Lungs CTAB. Skin:  Skin is warm, dry and intact. No rash noted. Psychiatric: Mood and affect are normal. Speech and behavior are normal.  ____________________________________________   LABS (all labs ordered are listed, but only abnormal results are displayed)  Labs Reviewed - No data to display ____________________________________________  EKG   ____________________________________________  RADIOLOGY  ED MD interpretation:    Official radiology report(s): No results found.  ____________________________________________   PROCEDURES  Procedure(s) performed (including Critical Care):  Procedures   ____________________________________________   INITIAL IMPRESSION / ASSESSMENT AND PLAN / ED COURSE  As part of my medical decision making, I reviewed the following data within the electronic  MEDICAL RECORD NUMBER     Patient present with dental pain secondary to overall poor dental hygiene.  Patient given discharge care instruction list of dental clinics for follow-up care.  Take medication as directed.    Beth Mcguire was evaluated in Emergency Department on 03/08/2020 for the symptoms described in the history of present illness. She was evaluated in the context of the global COVID-19 pandemic, which necessitated consideration that the patient might be at risk for infection with the SARS-CoV-2 virus that causes COVID-19. Institutional protocols and algorithms that pertain to the evaluation of patients at risk for COVID-19 are in a state of rapid change based on information released by regulatory bodies including the CDC and federal and state organizations. These policies  and algorithms were followed during the patient's care in the ED.       ____________________________________________   FINAL CLINICAL IMPRESSION(S) / ED DIAGNOSES  Final diagnoses:  Infected dental caries     ED Discharge Orders         Ordered    lidocaine (XYLOCAINE) 2 % solution  Every 6 hours PRN     03/08/20 1124    traMADol (ULTRAM) 50 MG tablet  Every 12 hours PRN     03/08/20 1124           Note:  This document was prepared using Dragon voice recognition software and may include unintentional dictation errors.    Sable Feil, PA-C 03/08/20 1130    Carrie Mew, MD 03/08/20 1313

## 2020-03-08 NOTE — ED Triage Notes (Signed)
Pt c/o left upper tooth ache today. Pt has several dental caries noted.

## 2020-03-08 NOTE — ED Notes (Signed)
See triage note  Presents with pain to left upper gumline  Possible dental abscess

## 2020-08-22 ENCOUNTER — Other Ambulatory Visit (INDEPENDENT_AMBULATORY_CARE_PROVIDER_SITE_OTHER): Payer: Self-pay | Admitting: Nurse Practitioner

## 2020-08-22 NOTE — Telephone Encounter (Signed)
Pt has not been seen here in a year is this ok to fill?

## 2020-08-22 NOTE — Telephone Encounter (Signed)
Patient will need to have ABI and OV before next refill as she hasn't been seen in over a year

## 2020-08-22 NOTE — Telephone Encounter (Signed)
Can you please schedule the pt per the Np for the following  ABI and OV before next refill as she hasn't been seen in over a year .

## 2020-08-23 NOTE — Telephone Encounter (Signed)
Pt has not been seen  in a year is this ok to refill?

## 2020-08-23 NOTE — Telephone Encounter (Signed)
I think that this was addressed yesterday, but the patient was only given a 30 day refill because she needs to seen in office with ABIs.  Please make note of this, in case this request is sent again.

## 2020-09-21 ENCOUNTER — Other Ambulatory Visit (INDEPENDENT_AMBULATORY_CARE_PROVIDER_SITE_OTHER): Payer: Self-pay | Admitting: Nurse Practitioner

## 2020-09-21 ENCOUNTER — Other Ambulatory Visit (INDEPENDENT_AMBULATORY_CARE_PROVIDER_SITE_OTHER): Payer: Self-pay | Admitting: Vascular Surgery

## 2020-09-21 NOTE — Telephone Encounter (Signed)
Pt has not been seen here since 2019 is this ok to deny due to needing an office visit an U/S

## 2020-10-08 DIAGNOSIS — F0390 Unspecified dementia without behavioral disturbance: Secondary | ICD-10-CM

## 2020-10-08 HISTORY — DX: Unspecified dementia, unspecified severity, without behavioral disturbance, psychotic disturbance, mood disturbance, and anxiety: F03.90

## 2021-01-03 ENCOUNTER — Other Ambulatory Visit: Payer: Self-pay

## 2021-01-03 ENCOUNTER — Emergency Department
Admission: EM | Admit: 2021-01-03 | Discharge: 2021-01-03 | Disposition: A | Discharge status: Home / Self Care | Payer: Medicare HMO | Attending: Emergency Medicine | Admitting: Emergency Medicine

## 2021-01-03 DIAGNOSIS — K047 Periapical abscess without sinus: Secondary | ICD-10-CM | POA: Diagnosis not present

## 2021-01-03 DIAGNOSIS — E119 Type 2 diabetes mellitus without complications: Secondary | ICD-10-CM | POA: Insufficient documentation

## 2021-01-03 DIAGNOSIS — Z7902 Long term (current) use of antithrombotics/antiplatelets: Secondary | ICD-10-CM | POA: Insufficient documentation

## 2021-01-03 DIAGNOSIS — Z79899 Other long term (current) drug therapy: Secondary | ICD-10-CM | POA: Diagnosis not present

## 2021-01-03 DIAGNOSIS — Z794 Long term (current) use of insulin: Secondary | ICD-10-CM | POA: Diagnosis not present

## 2021-01-03 DIAGNOSIS — Z85038 Personal history of other malignant neoplasm of large intestine: Secondary | ICD-10-CM | POA: Diagnosis not present

## 2021-01-03 DIAGNOSIS — Z7982 Long term (current) use of aspirin: Secondary | ICD-10-CM | POA: Diagnosis not present

## 2021-01-03 DIAGNOSIS — Z7984 Long term (current) use of oral hypoglycemic drugs: Secondary | ICD-10-CM | POA: Insufficient documentation

## 2021-01-03 DIAGNOSIS — I10 Essential (primary) hypertension: Secondary | ICD-10-CM | POA: Diagnosis not present

## 2021-01-03 DIAGNOSIS — K0889 Other specified disorders of teeth and supporting structures: Secondary | ICD-10-CM | POA: Diagnosis present

## 2021-01-03 MED ORDER — AMOXICILLIN 500 MG PO CAPS: 500.0000 mg | ORAL_CAPSULE | Freq: Three times a day (TID) | ORAL | 0 refills | Status: DC

## 2021-01-03 NOTE — ED Provider Notes (Signed)
[facility] Emergency Department Provider Note  ____________________________________________   Event Date/Time   First MD Initiated Contact with Patient 01/03/21 1036     (approximate)  I have reviewed the triage vital signs and the nursing notes.   HISTORY  Chief Complaint Dental Pain   HPI Beth Mcguire is a 80 y.o. female presents to the ED with dental pain.  Patient states that her tooth broke off "sometime ago".  Patient and family member states that last night her gum began swelling and pain increased.  She was unable to see a dentist today as she does not have a regular dentist.  She rates her pain as an 8 out of 10.     Past Medical History:  Diagnosis Date  . Anxiety   . Colon cancer ([place])   . Diabetes ([place])   . Hypertension   . Peripheral vascular disease [facility])     Patient Active Problem List   Diagnosis Date Noted  . PAD (peripheral artery disease) ([place]) 09/18/2017  . Pure hypercholesterolemia 08/13/2017  . Hyperlipidemia 07/25/2016  . Essential hypertension 07/25/2016  . Diabetes ([place]) 07/25/2016  . 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 Foot osteomyelitis, right ([place]) 01/12/2016  . Osteomyelitis ([place]) 01/12/2016  . Toe ulcer due to DM ([place]) 01/04/2016    Past Surgical History:  Procedure Laterality Date  . AMPUTATION Right 01/14/2016   Procedure: AMPUTATION RAY;  Surgeon: Albertine Patricia, DPM;  Location: [facility] ORS;  Service: Podiatry;  Laterality: Right;  . COLON SURGERY    . LOWER EXTREMITY ANGIOGRAPHY Left 08/14/2017   Procedure: Lower Extremity Angiography;  Surgeon: Algernon Huxley, MD;  Location: [place] CV LAB;  Service: Cardiovascular;  Laterality: Left;  . PERIPHERAL VASCULAR CATHETERIZATION N/A 01/05/2016   Procedure: Lower Extremity Angiography;  Surgeon: Algernon Huxley, MD;  Location: [place] CV LAB;  Service: Cardiovascular;  Laterality: N/A;  . PERIPHERAL  VASCULAR CATHETERIZATION  01/05/2016   Procedure: Lower Extremity Intervention;  Surgeon: Algernon Huxley, MD;  Location: [place] CV LAB;  Service: Cardiovascular;;  . PERIPHERAL VASCULAR CATHETERIZATION Right 06/25/2016   Procedure: Lower Extremity Angiography;  Surgeon: Algernon Huxley, MD;  Location: [place] CV LAB;  Service: Cardiovascular;  Laterality: Right;  . PERIPHERAL VASCULAR CATHETERIZATION  06/25/2016   Procedure: Lower Extremity Intervention;  Surgeon: Algernon Huxley, MD;  Location: [place] CV LAB;  Service: Cardiovascular;;  . TONSILLECTOMY      Prior to Admission medications   Medication Sig Start Date End Date Taking? Authorizing Provider  amoxicillin (AMOXIL) 500 MG capsule Take 1 capsule (500 mg total) by mouth 3 (three) times daily. 01/03/21  Yes Johnn Hai, PA-C  aspirin 81 MG tablet Take 1 tablet (81 mg total) by mouth daily. 01/06/16   Hillary Bow, MD  atorvastatin (LIPITOR) 10 MG tablet Take 10 mg every evening by mouth.  02/08/16   [provider]  B-D ULTRAFINE III SHORT PEN 31G X 8 MM MISC  08/15/17   [provider]  Blood Glucose Monitoring Suppl (ONE TOUCH ULTRA MINI) w/Device KIT  07/23/16   [provider]  clopidogrel (PLAVIX) 75 MG tablet TAKE 1 TABLET BY MOUTH EVERY DAY 09/22/20   Kris Hartmann, NP  glucose monitoring kit (FREESTYLE) monitoring kit 1 each by Does not apply route 4 (four) times daily -  before meals and at bedtime. 01/06/16   Hillary Bow, MD  Insulin Lispro  Prot & Lispro (HUMALOG MIX 75/25 KWIKPEN) (75-25) 100 UNIT/ML Kwikpen Inject 25 Units 2 (two) times daily with a meal into the skin.  01/02/16   [provider]  lidocaine (XYLOCAINE) 2 % solution Use as directed 5 mLs in the mouth or throat every 6 (six) hours as needed for mouth pain. Oral swish 03/08/20   Sable Feil, PA-C  lisinopril (PRINIVIL,ZESTRIL) 20 MG tablet Take 20 mg by mouth daily.    [provider]  metFORMIN  (GLUCOPHAGE-XR) 500 MG 24 hr tablet take 1 tablet by mouth once daily with DINNER 08/15/17   [provider]  ONE TOUCH ULTRA TEST test strip  07/23/16   [provider]  Jonetta Speak LANCETS 00T [place]  08/30/17   [provider]  QUEtiapine (SEROQUEL) 25 MG tablet 25 mg daily. 09/08/18   [provider]    Allergies Patient has no known allergies.  Family History  Problem Relation Age of Onset  . Diabetes Other     Social History Social History   Tobacco Use  . Smoking status: Never Smoker  . Smokeless tobacco: Never Used  Substance Use Topics  . Alcohol use: No    Alcohol/week: 0.0 standard drinks  . Drug use: No    Review of Systems Constitutional: No fever/chills Eyes: No visual changes. ENT: No sore throat. Cardiovascular: Denies chest pain. Respiratory: Denies shortness of breath. Gastrointestinal: No abdominal pain.  No nausea, no vomiting.   Musculoskeletal: Negative for musculoskeletal pain. Skin: Negative for rash. Neurological: Negative for headaches, focal weakness or numbness.  ____________________________________________   PHYSICAL EXAM:  VITAL SIGNS: ED Triage Vitals  Enc Vitals Group     BP 01/03/21 1025 (!) 130/45     Pulse Rate 01/03/21 1025 73     Resp 01/03/21 1025 18     Temp 01/03/21 1025 98.7 F (37.1 C)     Temp Source 01/03/21 1025 Oral     SpO2 01/03/21 1025 97 %     Weight 01/03/21 1025 165 lb (74.8 kg)     Height 01/03/21 1025 '5\' 2"'  (1.575 m)     Head Circumference --      Peak Flow --      Pain Score 01/03/21 1025 8     Pain Loc --      Pain Edu? --      Excl. in [place]? --     Constitutional: Alert and oriented. Well appearing and in no acute distress. Eyes: Conjunctivae are normal. PERRL. EOMI. Head: Atraumatic. Nose: No congestion/rhinnorhea. Mouth/Throat: Mucous membranes are moist.  Right lower premolars and molars are in extremely poor repair with all decayed below the gumline.  No  active drainage but gums are moderately edematous and tender to palpation with a tongue depressor. Neck: No stridor.  Supple without adenopathy. Cardiovascular: Normal rate, regular rhythm. Grossly normal heart sounds.  Good peripheral circulation. Respiratory: Normal respiratory effort.  No retractions. Lungs CTAB. Neurologic:  Normal speech and language. No gross focal neurologic deficits are appreciated. No gait instability. Skin:  Skin is warm, dry and intact.  Psychiatric: Mood and affect are normal. Speech and behavior are normal.  ____________________________________________   LABS (all labs ordered are listed, but only abnormal results are displayed)  Labs Reviewed - No data to display  PROCEDURES  Procedure(s) performed (including Critical Care):  Procedures   ____________________________________________   INITIAL IMPRESSION / ASSESSMENT AND PLAN / ED COURSE  As part of my medical decision making, I  reviewed the following data within the electronic MEDICAL RECORD NUMBER Notes from prior ED visits and [place] Controlled Substance Database  80 year old female presents to the ED with possible dental infection.  Patient states that her tooth was broken off "a while ago" and do not begin to swell last evening.  Patient denies any fever or chills.  Currently she does not have a routine dentist.  There are multiple caries with gum edema noted right lower premolar and molar area.  Patient was started on amoxicillin 500 mg 3 times daily for 10 days and given a list of dental clinics in the area that charge on a sliding scale.  Patient was instructed to take over-the-counter medication only if needed for pain.   ____________________________________________   FINAL CLINICAL IMPRESSION(S) / ED DIAGNOSES  Final diagnoses:  Dental abscess     ED Discharge Orders         Ordered    amoxicillin (AMOXIL) 500 MG capsule  3 times daily        01/03/21 1109          *Please note:  Beth Mcguire was evaluated in Emergency Department on 01/03/2021 for the symptoms described in the history of present illness. She was evaluated in the context of the global COVID-19 pandemic, which necessitated consideration that the patient might be at risk for infection with the SARS-CoV-2 virus that causes COVID-19. Institutional protocols and algorithms that pertain to the evaluation of patients at risk for COVID-19 are in a state of rapid change based on information released by regulatory bodies including the CDC and federal and state organizations. These policies and algorithms were followed during the patient's care in the ED.  Some ED evaluations and interventions may be delayed as a result of limited staffing during and the pandemic.*   Note:  This document was prepared using Dragon voice recognition software and may include unintentional dictation errors.    Johnn Hai, PA-C 01/03/21 1225    Blake Divine, MD 01/03/21 [phone]

## 2021-01-03 NOTE — Discharge Instructions (Signed)
Begin taking antibiotics until completely finished.  Amoxicillin is 3 times a day.  You may take Tylenol with this medication if needed.  A list of dental clinics is also listed on your discharge papers which discharge by income based.  You may want to call 1 of these or also consider the clinic at [facility] that takes walk-in patients.  OPTIONS FOR DENTAL FOLLOW UP CARE  [place] [facility] and [facility] [email or web address] [phone])  [facility] [phone])  [place] [phone] ext [phone])  [place] [phone])  [facility] [phone]) This clinic caters to the indigent population and is on a lottery system. Location: [employer] [facility], [employer], [place], [place] Clinic Hours: Wednesdays from 6pm - 9pm, patients seen by a lottery system. For dates, call or go to [email or web address] Services: Cleanings, fillings and simple extractions. Payment Options: DENTAL WORK IS FREE OF CHARGE. Bring proof of income or support. Best way to get seen: Arrive at 5:15 pm - this is a lottery, NOT first come/first serve, so arriving earlier will not increase your chances of being seen.     [facility] [phone] Select option 1 for emergencies   Location: [facility], [place], [address] [place] Clinic Hours: No walk-ins accepted - call the day before to schedule an appointment. Check in times are 9:30 am and 1:30 pm. Services: Simple extractions, temporary fillings, pulpectomy/pulp debridement, uncomplicated abscess drainage. Payment Options: PAYMENT IS DUE AT THE TIME OF SERVICE.  Fee is usually $100-200, additional surgical procedures (e.g. abscess drainage) may be extra. Cash, checks, Visa/MasterCard  accepted.  Can file Medicaid if patient is covered for dental - patient should call case worker to check. No discount for [facility] patients. Best way to get seen: MUST call the day before and get onto the schedule. Can usually be seen the next 1-2 days. No walk-ins accepted.     [place] [phone]   Location: [place], [place] Clinic Hours: M, W, Th, F 8am or 1:30pm, Tues 9a or 1:30 - first come/first served. Services: Simple extractions, temporary fillings, uncomplicated abscess drainage.  You do not need to be an [facility] resident. Payment Options: PAYMENT IS DUE AT THE TIME OF SERVICE. Dental insurance, otherwise sliding scale - bring proof of income or support. Depending on income and treatment needed, cost is usually $50-200. Best way to get seen: Arrive early as it is first come/first served.     [facility] [phone]   Location: [place] [facility] Hours: Mon-Thu 8a-5p Services: Most basic dental services including extractions and fillings. Payment Options: PAYMENT IS DUE AT THE TIME OF SERVICE. Sliding scale, up to 50% off - bring proof if income or support. Medicaid with dental option accepted. Best way to get seen: Call to schedule an appointment, can usually be seen within 2 weeks OR they will try to see walk-ins - show up at [place] or 2p (you may have to wait).     [facility] RESIDENTS ONLY   Location: [facility], [place] [address] [place], [place] [address] Clinic Hours: By appointment only. Monday - Thursday 8am-5pm, Friday 8am-12pm Services: Cleanings, fillings, extractions. Payment Options: PAYMENT IS DUE AT THE TIME OF SERVICE. Cash, Visa or MasterCard. Sliding  scale - $30 minimum per service. Best way to get seen: Come in to office, complete packet and make an  appointment - need proof of income or support monies for each household member and proof of [facility] residence. Usually takes about a month to get in.     [facility] [phone]   Location: [address], [place] Clinic Hours: Walk-in Urgent Care Dental Services are offered Monday-Friday mornings only. The numbers of emergencies accepted daily is limited to the number of providers available. Maximum 15 - Mondays, Wednesdays & Thursdays Maximum 10 - Tuesdays & Fridays Services: You do not need to be a [facility] resident to be seen for a dental emergency. Emergencies are defined as pain, swelling, abnormal bleeding, or dental trauma. Walkins will receive x-rays if needed. NOTE: Dental cleaning is not an emergency. Payment Options: PAYMENT IS DUE AT THE TIME OF SERVICE. Minimum co-pay is $40.00 for uninsured patients. Minimum co-pay is $3.00 for Medicaid with dental coverage. Dental Insurance is accepted and must be presented at time of visit. Medicare does not cover dental. Forms of payment: Cash, credit card, checks. Best way to get seen: If not previously registered with the clinic, walk-in dental registration begins at 7:15 am and is on a first come/first serve basis. If previously registered with the clinic, call to make an appointment.     [facility] [place] ONLY   Location: [address], [place], [place] Clinic Hours: Mon-Thu 10a-2p Services: Extractions only! Payment Options: FREE (donations accepted) - bring proof of income or support Best way to get seen: Call and schedule an appointment OR come at 8am on the 1st Monday of every month (except for holidays) when it is first come/first served.     [facility] [phone]   Location: [place], [place] Clinic Hours: Friday mornings Services, Payment Options, Best way to get seen: Call for info

## 2021-01-03 NOTE — ED Notes (Signed)
See triage note  Presents with possible dental infection  States she has had a broken tooth to right lower gum for some time  Developed increased pain with some swelling yesterday

## 2021-01-03 NOTE — ED Triage Notes (Signed)
Pt c/o right lower tooth ache since yesterday, states the tooth is broke off

## 2021-06-18 ENCOUNTER — Other Ambulatory Visit: Payer: Self-pay

## 2021-06-18 ENCOUNTER — Emergency Department: Payer: Medicare HMO

## 2021-06-18 ENCOUNTER — Emergency Department
Admission: EM | Admit: 2021-06-18 | Discharge: 2021-06-18 | Disposition: A | Discharge status: Home / Self Care | Payer: Medicare HMO | Attending: Emergency Medicine | Admitting: Emergency Medicine

## 2021-06-18 DIAGNOSIS — E119 Type 2 diabetes mellitus without complications: Secondary | ICD-10-CM | POA: Insufficient documentation

## 2021-06-18 DIAGNOSIS — Z794 Long term (current) use of insulin: Secondary | ICD-10-CM | POA: Diagnosis not present

## 2021-06-18 DIAGNOSIS — Z800 Family history of malignant neoplasm of digestive organs: Secondary | ICD-10-CM | POA: Diagnosis not present

## 2021-06-18 DIAGNOSIS — S99911A Unspecified injury of right ankle, initial encounter: Secondary | ICD-10-CM | POA: Diagnosis present

## 2021-06-18 DIAGNOSIS — S9031XA Contusion of right foot, initial encounter: Secondary | ICD-10-CM | POA: Diagnosis not present

## 2021-06-18 DIAGNOSIS — I10 Essential (primary) hypertension: Secondary | ICD-10-CM | POA: Diagnosis not present

## 2021-06-18 DIAGNOSIS — W19XXXA Unspecified fall, initial encounter: Secondary | ICD-10-CM

## 2021-06-18 DIAGNOSIS — Z7984 Long term (current) use of oral hypoglycemic drugs: Secondary | ICD-10-CM | POA: Diagnosis not present

## 2021-06-18 DIAGNOSIS — Z79899 Other long term (current) drug therapy: Secondary | ICD-10-CM | POA: Diagnosis not present

## 2021-06-18 DIAGNOSIS — W1839XA Other fall on same level, initial encounter: Secondary | ICD-10-CM | POA: Insufficient documentation

## 2021-06-18 DIAGNOSIS — Z7982 Long term (current) use of aspirin: Secondary | ICD-10-CM | POA: Diagnosis not present

## 2021-06-18 NOTE — ED Provider Notes (Signed)
[facility] Emergency Department Provider Note ____________________________________________  Time seen: 1215  I have reviewed the triage vital signs and the nursing notes.  HISTORY  Chief Complaint  Ankle Pain   HPI Beth Mcguire is a 80 y.o. female presents to the ER today with complaint of right foot pain, swelling and bruising.  She reports she fell this morning at about 5:30 AM while attempting to go to the restroom.  She describes the pain as sore and achy, worse with ambulation.  She has not taken any medications OTC for her symptoms.  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  She does follow with podiatry.  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   Foot osteomyelitis, right ([place]) 01/12/2016   Osteomyelitis ([place]) 01/12/2016   Toe ulcer due to DM ([place]) 01/04/2016    Past Surgical History:  Procedure Laterality Date   AMPUTATION Right 01/14/2016   Procedure: AMPUTATION RAY;  Surgeon: Albertine Patricia, DPM;  Location: [facility] ORS;  Service: Podiatry;  Laterality: Right;   COLON SURGERY     LOWER EXTREMITY ANGIOGRAPHY Left 08/14/2017   Procedure: Lower Extremity Angiography;  Surgeon: Algernon Huxley, MD;  Location: [place] CV LAB;  Service: Cardiovascular;  Laterality: Left;   PERIPHERAL VASCULAR CATHETERIZATION N/A 01/05/2016   Procedure: Lower Extremity Angiography;  Surgeon: Algernon Huxley, MD;  Location: [place] CV LAB;  Service: Cardiovascular;  Laterality: N/A;   PERIPHERAL  VASCULAR CATHETERIZATION  01/05/2016   Procedure: Lower Extremity Intervention;  Surgeon: Algernon Huxley, MD;  Location: [place] CV LAB;  Service: Cardiovascular;;   PERIPHERAL VASCULAR CATHETERIZATION Right 06/25/2016   Procedure: Lower Extremity Angiography;  Surgeon: Algernon Huxley, MD;  Location: [place] CV LAB;  Service: Cardiovascular;  Laterality: Right;   PERIPHERAL VASCULAR CATHETERIZATION  06/25/2016   Procedure: Lower Extremity Intervention;  Surgeon: Algernon Huxley, MD;  Location: [place] CV LAB;  Service: Cardiovascular;;   TONSILLECTOMY      Prior to Admission medications   Medication Sig Start Date End Date Taking? Authorizing Provider  amoxicillin (AMOXIL) 500 MG capsule Take 1 capsule (500 mg total) by mouth 3 (three) times daily. 01/03/21   Johnn Hai, PA-C  aspirin 81 MG tablet Take 1 tablet (81 mg total) by mouth daily. 01/06/16   Hillary Bow, MD  atorvastatin (LIPITOR) 10 MG tablet Take 10 mg every evening by mouth.  02/08/16   [provider]  B-D ULTRAFINE III SHORT PEN 31G X 8 MM MISC  08/15/17   [provider]  Blood Glucose Monitoring Suppl (ONE TOUCH ULTRA MINI) w/Device KIT  07/23/16   [provider]  clopidogrel (PLAVIX) 75 MG tablet TAKE 1 TABLET BY MOUTH EVERY DAY 09/22/20   Kris Hartmann, NP  glucose monitoring kit (FREESTYLE) monitoring kit 1 each by Does not apply route 4 (four) times daily -  before meals and at bedtime. 01/06/16   Hillary Bow, MD  Insulin Lispro Prot & Lispro (HUMALOG MIX  75/25 KWIKPEN) (75-25) 100 UNIT/ML Kwikpen Inject 25 Units 2 (two) times daily with a meal into the skin.  01/02/16   [provider]  lidocaine (XYLOCAINE) 2 % solution Use as directed 5 mLs in the mouth or throat every 6 (six) hours as needed for mouth pain. Oral swish 03/08/20   Sable Feil, PA-C  lisinopril (PRINIVIL,ZESTRIL) 20 MG tablet Take 20 mg by mouth daily.    [provider]  metFORMIN (GLUCOPHAGE-XR)  500 MG 24 hr tablet take 1 tablet by mouth once daily with DINNER 08/15/17   [provider]  ONE TOUCH ULTRA TEST test strip  07/23/16   [provider]  Jonetta Speak LANCETS 37C [place]  08/30/17   [provider]  QUEtiapine (SEROQUEL) 25 MG tablet 25 mg daily. 09/08/18   [provider]    Allergies Patient has no known allergies.  Family History  Problem Relation Age of Onset   Diabetes Other     Social History Social History   Tobacco Use   Smoking status: Never   Smokeless tobacco: Never  Substance Use Topics   Alcohol use: No    Alcohol/week: 0.0 standard drinks   Drug use: No    Review of Systems  Constitutional: Negative for fever, chills or body aches. Cardiovascular: Negative for chest pain or chest tightness. Respiratory: Negative for cough or shortness of breath. Musculoskeletal: Positive for right foot pain and swelling.  Negative for difficulty with gait, ankle pain. Skin: Positive for bruising of her right foot.  Negative for abrasion. Neurological: Negative for focal weakness, tingling or numbness. ____________________________________________  PHYSICAL EXAM:  VITAL SIGNS: ED Triage Vitals  Enc Vitals Group     BP 06/18/21 1102 (!) 167/56     Pulse Rate 06/18/21 1102 73     Resp 06/18/21 1102 18     Temp 06/18/21 1102 98 F (36.7 C)     Temp src --      SpO2 06/18/21 1102 100 %     Weight --      Height --      Head Circumference --      Peak Flow --      Pain Score 06/18/21 1101 4     Pain Loc --      Pain Edu? --      Excl. in [place]? --     Constitutional: Alert and oriented.  Chronically ill-appearing but in no distress. Head: Normocephalic and atraumatic. Eyes: Normal extraocular movements Cardiovascular: Normal rate, regular rhythm.  Pedal pulse 1+ on the right. Respiratory: Normal respiratory effort. No wheezes/rales/rhonchi noted. Musculoskeletal: Normal flexion, extension and rotation of the right  ankle.  No pain with palpation over the distal tibia, malleoli.  She has pain with palpation over the third fourth and fifth distal metatarsals.  1+ swelling noted over the right midfoot. Neurologic:   Normal speech and language. No gross focal neurologic deficits are appreciated. Skin:  Skin is warm, dry and intact.  Bruising noted over the third fourth and fifth metatarsals. ____________________________________________   RADIOLOGY Imaging Orders         DG Foot Complete Right    IMPRESSION: 1. Marked dorsal soft tissue swelling at the level of the metatarsals with no underlying fracture or dislocation seen. 2. Moderate midfoot degenerative changes dorsally with a possible partial avulsion fracture of a spur arising from the proximal dorsal aspect of the navicular.  ____________________________________________    INITIAL IMPRESSION / ASSESSMENT AND PLAN /  ED COURSE  Right Foot Pain, Swelling and Bruising secondary to Fall:  DDx include right foot fracture, contusion X-ray right foot today negative for acute fracture. Did show evidence of of dorsal degenerative changes with possible partial avulsion fracture of a navicular spur. She declines pain medication in the ER today She was placed in a post op shoe Can take Tylenol 650 mg TID as needed for pain and inflammation She has a walker at home which she will use for assistance as needed She will follow up with her podiatrist as an outpatient if needed  ____________________________________________  FINAL CLINICAL IMPRESSION(S) / ED DIAGNOSES  Final diagnoses:  Contusion of right foot, initial encounter  Fall, initial encounter      Jearld Fenton, NP 06/18/21 1408    Harvest Dark, MD 06/18/21 1530

## 2021-06-18 NOTE — Discharge Instructions (Signed)
You are seen today for acute right foot pain, swelling and bruising.  Your x-ray does not show any acute findings.  You are placed in a postop shoe for comfort.  Please wear this with ambulation down to your foot no longer hurts.  We recommend elevation and ice.  You can take Tylenol 650 mg every 8 hours as needed for pain and inflammation.  Please follow-up with your PCP or podiatrist as needed.

## 2021-06-18 NOTE — ED Triage Notes (Signed)
Pt comes with c/o right ankle pain following a trip and fall last night. Pt denies any LOC or hitting her head. Pt states pain and swelling.

## 2021-10-10 ENCOUNTER — Other Ambulatory Visit (INDEPENDENT_AMBULATORY_CARE_PROVIDER_SITE_OTHER): Payer: Self-pay | Admitting: Nurse Practitioner

## 2021-10-10 DIAGNOSIS — I739 Peripheral vascular disease, unspecified: Secondary | ICD-10-CM

## 2021-10-10 DIAGNOSIS — L97529 Non-pressure chronic ulcer of other part of left foot with unspecified severity: Secondary | ICD-10-CM

## 2021-10-11 ENCOUNTER — Encounter (INDEPENDENT_AMBULATORY_CARE_PROVIDER_SITE_OTHER): Payer: Self-pay | Admitting: Nurse Practitioner

## 2021-10-11 ENCOUNTER — Ambulatory Visit (INDEPENDENT_AMBULATORY_CARE_PROVIDER_SITE_OTHER): Payer: Medicare HMO

## 2021-10-11 ENCOUNTER — Ambulatory Visit (INDEPENDENT_AMBULATORY_CARE_PROVIDER_SITE_OTHER): Payer: Medicare HMO | Admitting: Nurse Practitioner

## 2021-10-11 VITALS — BP 193/72 | HR 72 | Resp 16

## 2021-10-11 DIAGNOSIS — E7800 Pure hypercholesterolemia, unspecified: Secondary | ICD-10-CM | POA: Diagnosis not present

## 2021-10-11 DIAGNOSIS — I739 Peripheral vascular disease, unspecified: Secondary | ICD-10-CM

## 2021-10-11 DIAGNOSIS — I10 Essential (primary) hypertension: Secondary | ICD-10-CM

## 2021-10-11 DIAGNOSIS — E1151 Type 2 diabetes mellitus with diabetic peripheral angiopathy without gangrene: Secondary | ICD-10-CM

## 2021-10-11 DIAGNOSIS — L97529 Non-pressure chronic ulcer of other part of left foot with unspecified severity: Secondary | ICD-10-CM

## 2021-10-11 DIAGNOSIS — Z794 Long term (current) use of insulin: Secondary | ICD-10-CM

## 2021-10-11 DIAGNOSIS — I7025 Atherosclerosis of native arteries of other extremities with ulceration: Secondary | ICD-10-CM | POA: Diagnosis not present

## 2021-10-11 NOTE — Progress Notes (Signed)
° °Subjective:  ° ° Patient ID: Beth Mcguire, female    DOB: 02/11/1941, 81 y.o.   MRN: [id number] °Chief Complaint  °Patient presents with  ° New Patient (Initial Visit)  °  Ref ABI and consult  ° ° °Beth Mcguire is an 81-year-old female that returns to the office for followup and review of the noninvasive studies.  The patient underwent revascularization in 2019 however she was lost to follow-up.  She has had intervention on both lower extremities.  There has been a significant deterioration in the lower extremity symptoms.  The patient notes interval shortening of their claudication distance and development of mild rest pain symptoms.  There has been the development of several wounds and ulcerations on the left lower extremity.  Despite good wound care from podiatry these wounds have been very slow to heal. ° °There have been no significant changes to the patient's overall health care. ° °The patient denies amaurosis fugax or recent TIA symptoms. There are no recent neurological changes noted. °The patient denies history of DVT, PE or superficial thrombophlebitis. °The patient denies recent episodes of angina or shortness of breath.  ° °ABI's Rt=0.84 and Lt=0.53 (previous ABI's Rt=0.66 and Lt=1.2) °Duplex US of the lower extremity arterial system shows monophasic tibial artery waveforms with dampened toe waveforms bilaterally.   ° ° °Review of Systems  °Cardiovascular:  Positive for leg swelling.  °Skin:  Positive for wound.  °Neurological:  Positive for weakness.  °All other systems reviewed and are negative. ° °   °Objective:  ° Physical Exam °Vitals reviewed.  °HENT:  °   Head: Normocephalic.  °Cardiovascular:  °   Rate and Rhythm: Normal rate.  °   Pulses:     °     Dorsalis pedis pulses are 1+ on the right side and detected w/ Doppler on the left side.  °     Posterior tibial pulses are 0 on the right side and 0 on the left side.  °Pulmonary:  °   Effort: Pulmonary effort is normal.  °Skin: °   General:  Skin is warm and dry.  °Neurological:  °   Mental Status: She is alert and oriented to person, place, and time.  °   Motor: Weakness present.  °   Gait: Gait abnormal.  °Psychiatric:     °   Mood and Affect: Mood normal.     °   Behavior: Behavior normal.     °   Thought Content: Thought content normal.     °   Judgment: Judgment normal.  ° ° °BP (!) 193/72 (BP Location: Right Arm)    Pulse 72    Resp 16  ° °Past Medical History:  °Diagnosis Date  ° Anxiety   ° Colon cancer (HCC)   ° Diabetes (HCC)   ° Hypertension   ° Peripheral vascular disease (HCC)   ° ° °Social History  ° °Socioeconomic History  ° Marital status: Married  °  Spouse name: Not on file  ° Number of children: Not on file  ° Years of education: Not on file  ° Highest education level: Not on file  °Occupational History  ° Not on file  °Tobacco Use  ° Smoking status: Never  ° Smokeless tobacco: Never  °Substance and Sexual Activity  ° Alcohol use: No  °  Alcohol/week: 0.0 standard drinks  ° Drug use: No  ° Sexual activity: Not Currently  °Other Topics Concern  ° Not on file  °  Social History Narrative  ° Not on file  ° °Social Determinants of Health  ° °Financial Resource Strain: Not on file  °Food Insecurity: Not on file  °Transportation Needs: Not on file  °Physical Activity: Not on file  °Stress: Not on file  °Social Connections: Not on file  °Intimate Partner Violence: Not on file  ° ° °Past Surgical History:  °Procedure Laterality Date  ° AMPUTATION Right 01/14/2016  ° Procedure: AMPUTATION RAY;  Surgeon: Matthew Troxler, DPM;  Location: [facility];  Service: Podiatry;  Laterality: Right;  ° COLON SURGERY    ° LOWER EXTREMITY ANGIOGRAPHY Left 08/14/2017  ° Procedure: Lower Extremity Angiography;  Surgeon: Dew, Jason S, MD;  Location: [facility];  Service: Cardiovascular;  Laterality: Left;  ° PERIPHERAL VASCULAR CATHETERIZATION N/A 01/05/2016  ° Procedure: Lower Extremity Angiography;  Surgeon: Jason S Dew, MD;  Location: [facility];   Service: Cardiovascular;  Laterality: N/A;  ° PERIPHERAL VASCULAR CATHETERIZATION  01/05/2016  ° Procedure: Lower Extremity Intervention;  Surgeon: Jason S Dew, MD;  Location: [facility];  Service: Cardiovascular;;  ° PERIPHERAL VASCULAR CATHETERIZATION Right 06/25/2016  ° Procedure: Lower Extremity Angiography;  Surgeon: Jason S Dew, MD;  Location: [facility];  Service: Cardiovascular;  Laterality: Right;  ° PERIPHERAL VASCULAR CATHETERIZATION  06/25/2016  ° Procedure: Lower Extremity Intervention;  Surgeon: Jason S Dew, MD;  Location: [facility];  Service: Cardiovascular;;  ° TONSILLECTOMY    ° ° °Family History  °Problem Relation Age of Onset  ° Diabetes Other   ° ° °Allergies  °Allergen Reactions  ° Amoxicillin-Pot Clavulanate Rash  ° ° °CBC Latest Ref Rng & Units 01/14/2016 01/12/2016 01/05/2016  °WBC 3.6 - 11.0 K/uL 11.4(H) 13.2(H) 13.5(H)  °Hemoglobin 12.0 - 16.0 g/dL 10.2(L) 10.8(L) 10.0(L)  °Hematocrit 35.0 - 47.0 % 31.3(L) 32.9(L) 30.1(L)  °Platelets 150 - 440 K/uL 595(H) 678(H) 490(H)  ° ° ° ° °CMP  °   °Component Value Date/Time  ° NA 135 01/14/2016 0413  ° NA 142 11/13/2011 1110  ° K 4.0 01/14/2016 0413  ° K 4.6 11/13/2011 1110  ° CL 107 01/14/2016 0413  ° CL 106 11/13/2011 1110  ° CO2 23 01/14/2016 0413  ° CO2 28 11/13/2011 1110  ° GLUCOSE 190 (H) 01/14/2016 0413  ° GLUCOSE 123 (H) 11/13/2011 1110  ° BUN 18 08/14/2017 0912  ° BUN 12 11/13/2011 1110  ° CREATININE 1.11 (H) 08/14/2017 0912  ° CREATININE 1.10 12/04/2013 0953  ° CALCIUM 9.0 01/14/2016 0413  ° CALCIUM 8.7 11/13/2011 1110  ° PROT 7.6 12/04/2013 0953  ° ALBUMIN 3.6 12/04/2013 0953  ° AST 15 12/04/2013 0953  ° ALT 19 12/04/2013 0953  ° ALKPHOS 112 12/04/2013 0953  ° BILITOT 0.3 12/04/2013 0953  ° GFRNONAA 46 (L) 08/14/2017 0912  ° GFRNONAA 49 (L) 12/04/2013 0953  ° GFRAA 54 (L) 08/14/2017 0912  ° GFRAA 57 (L) 12/04/2013 0953  ° ° ° °No results found. ° °   °Assessment & Plan:  ° ° °1. Pure hypercholesterolemia °Continue  statin as ordered and reviewed, no changes at this time  ° °2. Atherosclerosis of native arteries of the extremities with ulceration (HCC) ° Recommend: ° °The patient has evidence of severe atherosclerotic changes of both lower extremities associated with ulceration and tissue loss of the foot.  This represents a limb threatening ischemia and places the patient at the risk for limb loss. ° °Patient should undergo angiography of the lower   extremities with the hope for intervention for limb salvage.  The risks and benefits as well as the alternative therapies was discussed in detail with the patient.  All questions were answered.  Patient agrees to proceed with angiography. ° °The patient will follow up with me in the office after the procedure.   ° °3. Essential hypertension °Continue antihypertensive medications as already ordered, these medications have been reviewed and there are no changes at this time.  ° °4. Type 2 diabetes mellitus with diabetic peripheral angiopathy without gangrene, with long-term current use of insulin (HCC) °Continue hypoglycemic medications as already ordered, these medications have been reviewed and there are no changes at this time. ° °Hgb A1C to be monitored as already arranged by primary service  ° ° °Current Outpatient Medications on File Prior to Visit  °Medication Sig Dispense Refill  ° aspirin 81 MG tablet Take 1 tablet (81 mg total) by mouth daily.    ° amoxicillin (AMOXIL) 500 MG capsule Take 1 capsule (500 mg total) by mouth 3 (three) times daily. 30 capsule 0  ° atorvastatin (LIPITOR) 10 MG tablet Take 10 mg every evening by mouth.     ° B-D ULTRAFINE III SHORT PEN 31G X 8 MM MISC   0  ° Blood Glucose Monitoring Suppl (ONE TOUCH ULTRA MINI) w/Device KIT     ° clopidogrel (PLAVIX) 75 MG tablet TAKE 1 TABLET BY MOUTH EVERY DAY 90 tablet 1  ° glucose monitoring kit (FREESTYLE) monitoring kit 1 each by Does not apply route 4 (four) times daily -  before meals and at bedtime. 1  each 0  ° Insulin Lispro Prot & Lispro (HUMALOG MIX 75/25 KWIKPEN) (75-25) 100 UNIT/ML Kwikpen Inject 25 Units 2 (two) times daily with a meal into the skin.     ° lidocaine (XYLOCAINE) 2 % solution Use as directed 5 mLs in the mouth or throat every 6 (six) hours as needed for mouth pain. Oral swish 100 mL 0  ° lisinopril (PRINIVIL,ZESTRIL) 20 MG tablet Take 20 mg by mouth daily.    ° metFORMIN (GLUCOPHAGE-XR) 500 MG 24 hr tablet take 1 tablet by mouth once daily with DINNER  0  ° ONE TOUCH ULTRA TEST test strip     ° ONETOUCH DELICA LANCETS 33G MISC   0  ° QUEtiapine (SEROQUEL) 25 MG tablet 25 mg daily.    ° °No current facility-administered medications on file prior to visit.  ° ° °There are no Patient Instructions on file for this visit. °No follow-ups on file. ° ° °Abdulwahab Demelo E Cahlil Sattar, NP ° ° °

## 2021-10-11 NOTE — H&P (View-Only) (Signed)
Subjective:    Patient ID: Beth Mcguire, female    DOB: 1941-02-09, 81 y.o.   MRN: [id number] Chief Complaint  Patient presents with   New Patient (Initial Visit)    Ref ABI and consult    Beth Mcguire is an 81 year old female that returns to the office for followup and review of the noninvasive studies.  The patient underwent revascularization in 2019 however she was lost to follow-up.  She has had intervention on both lower extremities.  There has been a significant deterioration in the lower extremity symptoms.  The patient notes interval shortening of their claudication distance and development of mild rest pain symptoms.  There has been the development of several wounds and ulcerations on the left lower extremity.  Despite good wound care from podiatry these wounds have been very slow to heal.  There have been no significant changes to the patient's overall health care.  The patient denies amaurosis fugax or recent TIA symptoms. There are no recent neurological changes noted. The patient denies history of DVT, PE or superficial thrombophlebitis. The patient denies recent episodes of angina or shortness of breath.   ABI's Rt=0.84 and Lt=0.53 (previous ABI's Rt=0.66 and Lt=1.2) Duplex US of the lower extremity arterial system shows monophasic tibial artery waveforms with dampened toe waveforms bilaterally.     Review of Systems  Cardiovascular:  Positive for leg swelling.  Skin:  Positive for wound.  Neurological:  Positive for weakness.  All other systems reviewed and are negative.     Objective:   Physical Exam Vitals reviewed.  HENT:     Head: Normocephalic.  Cardiovascular:     Rate and Rhythm: Normal rate.     Pulses:          Dorsalis pedis pulses are 1+ on the right side and detected w/ Doppler on the left side.       Posterior tibial pulses are 0 on the right side and 0 on the left side.  Pulmonary:     Effort: Pulmonary effort is normal.  Skin:    General:  Skin is warm and dry.  Neurological:     Mental Status: She is alert and oriented to person, place, and time.     Motor: Weakness present.     Gait: Gait abnormal.  Psychiatric:        Mood and Affect: Mood normal.        Behavior: Behavior normal.        Thought Content: Thought content normal.        Judgment: Judgment normal.    BP (!) 193/72 (BP Location: Right Arm)    Pulse 72    Resp 16   Past Medical History:  Diagnosis Date   Anxiety    Colon cancer ([place])    Diabetes ([place])    Hypertension    Peripheral vascular disease ([place])     Social History   Socioeconomic History   Marital status: Married    Spouse name: Not on file   Number of children: Not on file   Years of education: Not on file   Highest education level: Not on file  Occupational History   Not on file  Tobacco Use   Smoking status: Never   Smokeless tobacco: Never  Substance and Sexual Activity   Alcohol use: No    Alcohol/week: 0.0 standard drinks   Drug use: No   Sexual activity: Not Currently  Other Topics Concern   Not on file  Social History Narrative   Not on file   Social Determinants of Health   Financial Resource Strain: Not on file  Food Insecurity: Not on file  Transportation Needs: Not on file  Physical Activity: Not on file  Stress: Not on file  Social Connections: Not on file  Intimate Partner Violence: Not on file    Past Surgical History:  Procedure Laterality Date   AMPUTATION Right 01/14/2016   Procedure: AMPUTATION RAY;  Surgeon: Albertine Patricia, DPM;  Location: [facility];  Service: Podiatry;  Laterality: Right;   COLON SURGERY     LOWER EXTREMITY ANGIOGRAPHY Left 08/14/2017   Procedure: Lower Extremity Angiography;  Surgeon: Algernon Huxley, MD;  Location: [place] [facility];  Service: Cardiovascular;  Laterality: Left;   PERIPHERAL VASCULAR CATHETERIZATION N/A 01/05/2016   Procedure: Lower Extremity Angiography;  Surgeon: Algernon Huxley, MD;  Location: [place] [facility];   Service: Cardiovascular;  Laterality: N/A;   PERIPHERAL VASCULAR CATHETERIZATION  01/05/2016   Procedure: Lower Extremity Intervention;  Surgeon: Algernon Huxley, MD;  Location: [place] [facility];  Service: Cardiovascular;;   PERIPHERAL VASCULAR CATHETERIZATION Right 06/25/2016   Procedure: Lower Extremity Angiography;  Surgeon: Algernon Huxley, MD;  Location: [place] [facility];  Service: Cardiovascular;  Laterality: Right;   PERIPHERAL VASCULAR CATHETERIZATION  06/25/2016   Procedure: Lower Extremity Intervention;  Surgeon: Algernon Huxley, MD;  Location: [place] [facility];  Service: Cardiovascular;;   TONSILLECTOMY      Family History  Problem Relation Age of Onset   Diabetes Other     Allergies  Allergen Reactions   Amoxicillin-Pot Clavulanate Rash    CBC Latest Ref Rng & Units 01/14/2016 01/12/2016 01/05/2016  WBC 3.6 - 11.0 K/uL 11.4(H) 13.2(H) 13.5(H)  Hemoglobin 12.0 - 16.0 g/dL 10.2(L) 10.8(L) 10.0(L)  Hematocrit 35.0 - 47.0 % 31.3(L) 32.9(L) 30.1(L)  Platelets 150 - 440 K/uL 595(H) 678(H) 490(H)      CMP     Component Value Date/Time   NA 135 01/14/2016 0413   NA 142 11/13/2011 1110   K 4.0 01/14/2016 0413   K 4.6 11/13/2011 1110   CL 107 01/14/2016 0413   CL 106 11/13/2011 1110   CO2 23 01/14/2016 0413   CO2 28 11/13/2011 1110   GLUCOSE 190 (H) 01/14/2016 0413   GLUCOSE 123 (H) 11/13/2011 1110   BUN 18 08/14/2017 0912   BUN 12 11/13/2011 1110   CREATININE 1.11 (H) 08/14/2017 0912   CREATININE 1.10 12/04/2013 0953   CALCIUM 9.0 01/14/2016 0413   CALCIUM 8.7 11/13/2011 1110   PROT 7.6 12/04/2013 0953   ALBUMIN 3.6 12/04/2013 0953   AST 15 12/04/2013 0953   ALT 19 12/04/2013 0953   ALKPHOS 112 12/04/2013 0953   BILITOT 0.3 12/04/2013 0953   GFRNONAA 46 (L) 08/14/2017 0912   GFRNONAA 49 (L) 12/04/2013 0953   GFRAA 54 (L) 08/14/2017 0912   GFRAA 57 (L) 12/04/2013 0953     No results found.     Assessment & Plan:    1. Pure hypercholesterolemia Continue  statin as ordered and reviewed, no changes at this time   2. Atherosclerosis of native arteries of the extremities with ulceration ([place])  Recommend:  The patient has evidence of severe atherosclerotic changes of both lower extremities associated with ulceration and tissue loss of the foot.  This represents a limb threatening ischemia and places the patient at the risk for limb loss.  Patient should undergo angiography of the lower  extremities with the hope for intervention for limb salvage.  The risks and benefits as well as the alternative therapies was discussed in detail with the patient.  All questions were answered.  Patient agrees to proceed with angiography.  The patient will follow up with me in the office after the procedure.    3. Essential hypertension Continue antihypertensive medications as already ordered, these medications have been reviewed and there are no changes at this time.   4. 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  Hgb A1C to be monitored as already arranged by primary service    Current Outpatient Medications on File Prior to Visit  Medication Sig Dispense Refill   aspirin 81 MG tablet Take 1 tablet (81 mg total) by mouth daily.     amoxicillin (AMOXIL) 500 MG capsule Take 1 capsule (500 mg total) by mouth 3 (three) times daily. 30 capsule 0   atorvastatin (LIPITOR) 10 MG tablet Take 10 mg every evening by mouth.      B-D ULTRAFINE III SHORT PEN 31G X 8 MM MISC   0   Blood Glucose Monitoring Suppl (ONE TOUCH ULTRA MINI) w/Device KIT      clopidogrel (PLAVIX) 75 MG tablet TAKE 1 TABLET BY MOUTH EVERY DAY 90 tablet 1   glucose monitoring kit (FREESTYLE) monitoring kit 1 each by Does not apply route 4 (four) times daily -  before meals and at bedtime. 1  each 0   Insulin Lispro Prot & Lispro (HUMALOG MIX 75/25 KWIKPEN) (75-25) 100 UNIT/ML Kwikpen Inject 25 Units 2 (two) times daily with a meal into the skin.      lidocaine (XYLOCAINE) 2 % solution Use as directed 5 mLs in the mouth or throat every 6 (six) hours as needed for mouth pain. Oral swish 100 mL 0   lisinopril (PRINIVIL,ZESTRIL) 20 MG tablet Take 20 mg by mouth daily.     metFORMIN (GLUCOPHAGE-XR) 500 MG 24 hr tablet take 1 tablet by mouth once daily with DINNER  0   ONE TOUCH ULTRA TEST test strip      ONETOUCH DELICA LANCETS 93Y MISC   0   QUEtiapine (SEROQUEL) 25 MG tablet 25 mg daily.     No current facility-administered medications on file prior to visit.    There are no Patient Instructions on file for this visit. No follow-ups on file.   Kris Hartmann, NP

## 2021-10-12 ENCOUNTER — Telehealth (INDEPENDENT_AMBULATORY_CARE_PROVIDER_SITE_OTHER): Payer: Self-pay

## 2021-10-12 NOTE — Telephone Encounter (Signed)
Spoke with patients sister and the patient is scheduled with Dr. Lucky Cowboy for a LLE angio on 10/16/21 with a 8:00 am arrival time to the [facility]. Pre-procedure instructions were discussed and will be mailed.

## 2021-10-16 ENCOUNTER — Other Ambulatory Visit (INDEPENDENT_AMBULATORY_CARE_PROVIDER_SITE_OTHER): Payer: Self-pay | Admitting: Nurse Practitioner

## 2021-10-16 ENCOUNTER — Encounter: Admission: RE | Payer: Self-pay | Source: Home / Self Care

## 2021-10-16 ENCOUNTER — Ambulatory Visit: Admission: RE | Admit: 2021-10-16 | Payer: Medicare HMO | Source: Home / Self Care | Admitting: Vascular Surgery

## 2021-10-16 DIAGNOSIS — I7025 Atherosclerosis of native arteries of other extremities with ulceration: Secondary | ICD-10-CM

## 2021-10-16 DIAGNOSIS — I70299 Other atherosclerosis of native arteries of extremities, unspecified extremity: Secondary | ICD-10-CM

## 2021-10-16 SURGERY — LOWER EXTREMITY ANGIOGRAPHY
Anesthesia: Moderate Sedation | Site: Leg Lower | Laterality: Left

## 2021-10-30 ENCOUNTER — Telehealth (INDEPENDENT_AMBULATORY_CARE_PROVIDER_SITE_OTHER): Payer: Self-pay

## 2021-10-30 NOTE — Telephone Encounter (Signed)
Patient's daughter called back to reschedule the patient for a LLE angio with Dr. Lucky Cowboy. Patient is scheduled on 11/06/21 with a 9:00 am arrival time to the [facility]. Pre-procedure instructions were discussed and will be mailed.

## 2021-11-06 ENCOUNTER — Encounter
Admission: RE | Disposition: A | Discharge status: Home / Self Care | Payer: Self-pay | Source: Home / Self Care | Attending: Vascular Surgery

## 2021-11-06 ENCOUNTER — Ambulatory Visit
Admission: RE | Admit: 2021-11-06 | Discharge: 2021-11-06 | Disposition: A | Discharge status: Home / Self Care | Payer: Medicare HMO | Attending: Vascular Surgery | Admitting: Vascular Surgery

## 2021-11-06 ENCOUNTER — Other Ambulatory Visit (INDEPENDENT_AMBULATORY_CARE_PROVIDER_SITE_OTHER): Payer: Self-pay | Admitting: Nurse Practitioner

## 2021-11-06 ENCOUNTER — Encounter: Payer: Self-pay | Admitting: Vascular Surgery

## 2021-11-06 DIAGNOSIS — L97909 Non-pressure chronic ulcer of unspecified part of unspecified lower leg with unspecified severity: Secondary | ICD-10-CM

## 2021-11-06 DIAGNOSIS — Z794 Long term (current) use of insulin: Secondary | ICD-10-CM | POA: Diagnosis not present

## 2021-11-06 DIAGNOSIS — I7025 Atherosclerosis of native arteries of other extremities with ulceration: Secondary | ICD-10-CM

## 2021-11-06 DIAGNOSIS — E1151 Type 2 diabetes mellitus with diabetic peripheral angiopathy without gangrene: Secondary | ICD-10-CM | POA: Diagnosis not present

## 2021-11-06 DIAGNOSIS — I70245 Atherosclerosis of native arteries of left leg with ulceration of other part of foot: Secondary | ICD-10-CM | POA: Insufficient documentation

## 2021-11-06 DIAGNOSIS — I70249 Atherosclerosis of native arteries of left leg with ulceration of unspecified site: Secondary | ICD-10-CM | POA: Diagnosis not present

## 2021-11-06 DIAGNOSIS — I70299 Other atherosclerosis of native arteries of extremities, unspecified extremity: Secondary | ICD-10-CM

## 2021-11-06 DIAGNOSIS — L97529 Non-pressure chronic ulcer of other part of left foot with unspecified severity: Secondary | ICD-10-CM | POA: Insufficient documentation

## 2021-11-06 DIAGNOSIS — Z7984 Long term (current) use of oral hypoglycemic drugs: Secondary | ICD-10-CM | POA: Insufficient documentation

## 2021-11-06 DIAGNOSIS — I10 Essential (primary) hypertension: Secondary | ICD-10-CM | POA: Insufficient documentation

## 2021-11-06 DIAGNOSIS — E11621 Type 2 diabetes mellitus with foot ulcer: Secondary | ICD-10-CM | POA: Diagnosis not present

## 2021-11-06 DIAGNOSIS — E7800 Pure hypercholesterolemia, unspecified: Secondary | ICD-10-CM | POA: Diagnosis not present

## 2021-11-06 HISTORY — PX: LOWER EXTREMITY ANGIOGRAPHY: CATH118251

## 2021-11-06 LAB — BASIC METABOLIC PANEL
Anion gap: 7 (ref 5–15)
BUN: 15 mg/dL (ref 8–23)
CO2: 25 mmol/L (ref 22–32)
Calcium: 9.3 mg/dL (ref 8.9–10.3)
Chloride: 107 mmol/L (ref 98–111)
Creatinine, Ser: 0.97 mg/dL (ref 0.44–1.00)
GFR, Estimated: 58 mL/min — ABNORMAL LOW (ref >60)
Glucose, Bld: 43 mg/dL — CL (ref 70–99)
Potassium: 3.6 mmol/L (ref 3.5–5.1)
Sodium: 139 mmol/L (ref 135–145)

## 2021-11-06 LAB — GLUCOSE, CAPILLARY
Glucose-Capillary: 40 mg/dL — CL (ref 70–99)
Glucose-Capillary: 132 mg/dL — ABNORMAL HIGH (ref 70–99)
Glucose-Capillary: 142 mg/dL — ABNORMAL HIGH (ref 70–99)
Glucose-Capillary: 83 mg/dL (ref 70–99)

## 2021-11-06 SURGERY — LOWER EXTREMITY ANGIOGRAPHY
Anesthesia: Moderate Sedation | Site: Leg Lower | Laterality: Left

## 2021-11-06 MED ORDER — ONDANSETRON HCL 4 MG/2ML IJ SOLN: 4.0000 mg | Freq: Four times a day (QID) | INTRAMUSCULAR | Status: DC | PRN

## 2021-11-06 MED ORDER — HEPARIN SODIUM (PORCINE) 1000 UNIT/ML IJ SOLN
INTRAMUSCULAR | Status: DC | PRN
  Administered 2021-11-06: 5000 [IU] via INTRAVENOUS

## 2021-11-06 MED ORDER — DIPHENHYDRAMINE HCL 50 MG/ML IJ SOLN: 50.0000 mg | Freq: Once | INTRAMUSCULAR | Status: DC | PRN

## 2021-11-06 MED ORDER — DEXTROSE-NACL 5-0.45 % IV SOLN: INTRAVENOUS | Status: DC

## 2021-11-06 MED ORDER — SODIUM CHLORIDE 0.9 % IV SOLN: 250.0000 mL | INTRAVENOUS | Status: DC | PRN

## 2021-11-06 MED ORDER — MIDAZOLAM HCL 2 MG/2ML IJ SOLN
INTRAMUSCULAR | Status: DC | PRN
  Administered 2021-11-06: 1 mg via INTRAVENOUS

## 2021-11-06 MED ORDER — MIDAZOLAM HCL 2 MG/2ML IJ SOLN
INTRAMUSCULAR | Status: AC
  Filled 2021-11-06: qty 4

## 2021-11-06 MED ORDER — FAMOTIDINE 20 MG PO TABS: 40.0000 mg | ORAL_TABLET | Freq: Once | ORAL | Status: DC | PRN

## 2021-11-06 MED ORDER — HYDRALAZINE HCL 20 MG/ML IJ SOLN: 5.0000 mg | INTRAMUSCULAR | Status: DC | PRN

## 2021-11-06 MED ORDER — METHYLPREDNISOLONE SODIUM SUCC 125 MG IJ SOLR: 125.0000 mg | Freq: Once | INTRAMUSCULAR | Status: DC | PRN

## 2021-11-06 MED ORDER — SODIUM CHLORIDE 0.9% FLUSH: 3.0000 mL | INTRAVENOUS | Status: DC | PRN

## 2021-11-06 MED ORDER — MIDAZOLAM HCL 2 MG/ML PO SYRP: 8.0000 mg | ORAL_SOLUTION | Freq: Once | ORAL | Status: DC | PRN

## 2021-11-06 MED ORDER — SODIUM CHLORIDE 0.9 % IV SOLN: INTRAVENOUS | Status: DC

## 2021-11-06 MED ORDER — DEXTROSE 50 % IV SOLN
INTRAVENOUS | Status: AC
  Administered 2021-11-06: 50 mL via INTRAVENOUS
  Filled 2021-11-06: qty 50

## 2021-11-06 MED ORDER — CLINDAMYCIN PHOSPHATE 300 MG/50ML IV SOLN
INTRAVENOUS | Status: AC
  Administered 2021-11-06: 300 mg via INTRAVENOUS
  Filled 2021-11-06: qty 50

## 2021-11-06 MED ORDER — LABETALOL HCL 5 MG/ML IV SOLN: 10.0000 mg | INTRAVENOUS | Status: DC | PRN

## 2021-11-06 MED ORDER — IODIXANOL 320 MG/ML IV SOLN
INTRAVENOUS | Status: DC | PRN
  Administered 2021-11-06: 70 mL via INTRA_ARTERIAL

## 2021-11-06 MED ORDER — HYDROMORPHONE HCL 1 MG/ML IJ SOLN: 1.0000 mg | Freq: Once | INTRAMUSCULAR | Status: DC | PRN

## 2021-11-06 MED ORDER — NITROGLYCERIN 1 MG/10 ML FOR IR/CATH LAB
INTRA_ARTERIAL | Status: DC | PRN
  Administered 2021-11-06: 400 ug via INTRA_ARTERIAL

## 2021-11-06 MED ORDER — ACETAMINOPHEN 325 MG PO TABS: 650.0000 mg | ORAL_TABLET | ORAL | Status: DC | PRN

## 2021-11-06 MED ORDER — SODIUM CHLORIDE 0.9% FLUSH: 3.0000 mL | Freq: Two times a day (BID) | INTRAVENOUS | Status: DC

## 2021-11-06 MED ORDER — DEXTROSE 50 % IV SOLN: 1.0000 | Freq: Once | INTRAVENOUS | Status: AC

## 2021-11-06 MED ORDER — HEPARIN SODIUM (PORCINE) 1000 UNIT/ML IJ SOLN
INTRAMUSCULAR | Status: AC
  Filled 2021-11-06: qty 10

## 2021-11-06 MED ORDER — CLINDAMYCIN PHOSPHATE 300 MG/50ML IV SOLN: 300.0000 mg | Freq: Once | INTRAVENOUS | Status: AC

## 2021-11-06 MED ORDER — FENTANYL CITRATE (PF) 100 MCG/2ML IJ SOLN
INTRAMUSCULAR | Status: DC | PRN
  Administered 2021-11-06: 50 ug via INTRAVENOUS

## 2021-11-06 MED ORDER — FENTANYL CITRATE PF 50 MCG/ML IJ SOSY
PREFILLED_SYRINGE | INTRAMUSCULAR | Status: AC
  Filled 2021-11-06: qty 2

## 2021-11-06 SURGICAL SUPPLY — 23 items
BALLN LUTONIX 018 5X100X130 (BALLOONS) ×2
BALLN LUTONIX 018 5X60X130 (BALLOONS) ×4
BALLN LUTONIX DCB 5X60X130 (BALLOONS) ×2
BALLN ULTRVRSE 2.5X220X150 (BALLOONS) ×2
BALLOON LUTONIX 018 5X100X130 (BALLOONS) IMPLANT
BALLOON LUTONIX 018 5X60X130 (BALLOONS) IMPLANT
BALLOON LUTONIX DCB 5X60X130 (BALLOONS) IMPLANT
BALLOON ULTRVRSE 2.5X220X150 (BALLOONS) IMPLANT
CATH ANGIO 5F PIGTAIL 65CM (CATHETERS) ×1 IMPLANT
CATH ROTAREX 135 6FR (CATHETERS) ×1 IMPLANT
CATH VERT 5X100 (CATHETERS) ×1 IMPLANT
COVER PROBE U/S 5X48 (MISCELLANEOUS) ×1 IMPLANT
DEVICE STARCLOSE SE CLOSURE (Vascular Products) ×1 IMPLANT
GLIDEWIRE ADV .035X260CM (WIRE) ×1 IMPLANT
KIT ENCORE 26 ADVANTAGE (KITS) ×1 IMPLANT
PACK ANGIOGRAPHY (CUSTOM PROCEDURE TRAY) ×2 IMPLANT
SHEATH ANL2 6FRX45 HC (SHEATH) ×1 IMPLANT
SHEATH BRITE TIP 5FRX11 (SHEATH) ×1 IMPLANT
STENT VIABAHN 6X50X120 (Permanent Stent) ×2 IMPLANT
SYR MEDRAD MARK 7 150ML (SYRINGE) ×1 IMPLANT
TUBING CONTRAST HIGH PRESS 72 (TUBING) ×1 IMPLANT
WIRE G V18X300CM (WIRE) ×1 IMPLANT
WIRE GUIDERIGHT .035X150 (WIRE) ×1 IMPLANT

## 2021-11-06 NOTE — Progress Notes (Signed)
BG: 40 x 2 (1 from IV stick and 1 from finger stick). MD made aware and orders received. Pt. Med. With one amp D50W slow IVP and IVF's changed to D51/2 NS. Pt. Daughter states pt. "May have taken 60 units of insulin this AM & she was really sluggish over the weekend. She refuses to let anyone do her insulin for her. " Pt. Has HX of dementia per daughter starting "about a year ago."  Repeat BG: 140 . Pt. Sleepy. Pt. Oriented mostly to self & aware in hospital, but now aware at [facility]. Pt. Knows "it's my birthday tomorrow", but unable to tell year.

## 2021-11-06 NOTE — Op Note (Signed)
[facility]  Percutaneous Study/Intervention Procedural Note   Date of Surgery: 11/06/2021  Surgeon(s):Nanea Jared    Assistants:none  Pre-operative Diagnosis: PAD with ulceration left lower extremity  Post-operative diagnosis:  Same  Procedure(s) Performed:             1.  Ultrasound guidance for vascular access right femoral artery             2.  Catheter placement into left common femoral artery from right femoral approach             3.  Aortogram and selective left lower extremity angiogram             4.  Percutaneous transluminal angioplasty of left posterior tibial artery with 2.5 mm diameter angioplasty balloon             5.  Mechanical thrombectomy of the left SFA and popliteal arteries with the [place] Rex device  6.  Percutaneous transluminal angioplasty of the left SFA proximally with 5 mm diameter by 10 cm length Lutonix drug-coated angioplasty balloon and percutaneous transluminal angioplasty of left popliteal artery with 5 mm diameter by 6 cm length Lutonix drug-coated angioplasty  7.  Viabahn stent placement to the proximal left SFA with 6 mm diameter by 5 cm length Viabahn stent  8.  Viabahn stent placement to the left popliteal artery with 6 mm diameter by 5 cm length Viabahn stent             9.  StarClose closure device right femoral artery  EBL: 50 cc  Contrast: 70 cc  Fluoro Time: 7.0 minutes  Moderate Conscious Sedation Time: approximately 62 minutes using 1 mg of Versed and 50 mcg of Fentanyl              Indications:  Patient is a 81 y.o.female with nonhealing ulcerations and longstanding known severe peripheral arterial disease. The patient has noninvasive study showing reduced perfusion on her left side with the ulcerations. The patient is brought in for angiography for further evaluation and potential treatment.  Due to the limb threatening nature of the situation, angiogram was performed for attempted limb salvage. The patient is  aware that if the procedure fails, amputation would be expected.  The patient also understands that even with successful revascularization, amputation may still be required due to the severity of the situation.  Risks and benefits are discussed and informed consent is obtained.   Procedure:  The patient was identified and appropriate procedural time out was performed.  The patient was then placed supine on the table and prepped and draped in the usual sterile fashion. Moderate conscious sedation was administered during a face to face encounter with the patient throughout the procedure with my supervision of the RN administering medicines and monitoring the patient's vital signs, pulse oximetry, telemetry and mental status throughout from the start of the procedure until the patient was taken to the recovery room. Ultrasound was used to evaluate the right common femoral artery.  It was patent .  A digital ultrasound image was acquired.  A Seldinger needle was used to access the right common femoral artery under direct ultrasound guidance and a permanent image was performed.  A 0.035 J wire was advanced without resistance and a 5Fr sheath was placed.  Pigtail catheter was placed into the aorta and an AP aortogram was performed. 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  The aorta and iliac  arteries were calcific and mildly diseased but no hemodynamically significant stenosis was identified. I then crossed the aortic bifurcation and advanced to the left femoral head. Selective left lower extremity angiogram was then performed. This demonstrated that the left common femoral artery was patent.  The profunda femoris artery was small and heavily diseased.  The proximal left SFA just above the previously placed stent had 70 to 75% stenosis.  The stents themselves were widely patent.  The popliteal artery at the bottom and just below the previously placed stent had an 85 to 90%  stenosis.  The TP trunk and the posterior tibial artery were then diseased in the proximal segments with greater than 70% stenosis but then normalized in the midsegment and was the only runoff to the foot.  The peroneal and anterior tibial arteries were chronically occluded. It was felt that it was in the patient's best interest to proceed with intervention after these images to avoid a second procedure and a larger amount of contrast and fluoroscopy based off of the findings from the initial angiogram. The patient was systemically heparinized and a 6 [place] Ansell sheath was then placed over the [employer] wire. I then used a Kumpe catheter and the advantage wire to navigate through the SFA and popliteal lesions without difficulty and get down into the tibioperoneal trunk where I exchanged for a V 18 wire and parked this in the foot.  Mechanical thrombectomy was then performed with multiple passes of the [place] Rex device in the proximal left SFA above the previously placed stents and just into the previously placed stents.  It was then advanced down into the popliteal artery just below the previously placed stents and at the bottom of the previously placed stents and several passes were made there.  I then treated these areas with a 5 mm diameter by 10 cm length Lutonix drug-coated angioplasty balloon proximally inflated to 8 atm for 1 minute.  The popliteal artery for the distal lesion was 5 mm in diameter by 6 cm in length and was to 8 atm for 1 minute.  The tibioperoneal trunk and posterior tibial artery were then addressed with a 2.5 mm diameter by 22 cm length angioplasty balloon inflated to 12 atm for 1 minute.  Completion imaging showed the TP trunk and proximal posterior tibial arteries to have about a 30% residual stenosis.  There was greater than 50% residual stenosis in the SFA and popliteal arteries and I elected to place stents there.  For both lesions, a 6 mm diameter by 5 cm length Viabahn stent  was selected and deployed and then postdilated with a 5 mm balloon in both locations.  Completion imaging showed excellent flow with less than 10% residual stenosis in the SFA and popliteal arteries after stent placement. I elected to terminate the procedure. The sheath was removed and StarClose closure device was deployed in the right femoral artery with excellent hemostatic result. The patient was taken to the recovery room in stable condition having tolerated the procedure well.  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  The aorta and iliac arteries were calcific and mildly diseased but no hemodynamically significant stenosis was identified.             Left Lower Extremity: The left common femoral artery was patent.  The profunda femoris artery was small and heavily  diseased.  The proximal left SFA just above the previously placed stent had 70 to 75% stenosis.  The stents themselves were widely patent.  The popliteal artery at the bottom and just below the previously placed stent had an 85 to 90% stenosis.  The TP trunk and the posterior tibial artery were then diseased in the proximal segments with greater than 70% stenosis but then normalized in the midsegment and was the only runoff to the foot.  The peroneal and anterior tibial arteries were chronically occluded.   Disposition: Patient was taken to the recovery room in stable condition having tolerated the procedure well.  Complications: None  Leotis Pain 11/06/2021 11:45 AM   This note was created with Dragon Medical transcription system. Any errors in dictation are purely unintentional.

## 2021-11-06 NOTE — Progress Notes (Signed)
Pt. Ate entire lunch tray and drank 4 120 ml o.j.'s and 240 ml water. Pt. Remains confused, yet aware of self and situation re: left leg. Daughter at bedside. DC Instructions reviewed with pt. And her daughter . Message left at office re: follow up appt. For pt. Daughter verbalized understanding of DC instructions. Asked daughter to assist pt. With insulin doses upon DC home. Daughter verbalized "I will take over doing her insulin now. "

## 2021-11-06 NOTE — Interval H&P Note (Signed)
History and Physical Interval Note:  11/06/2021 9:39 AM  Beth Mcguire  has presented today for surgery, with the diagnosis of LLE Angiography   ASO with ulceration   BARD Rep   cc: Judi Cong.  The various methods of treatment have been discussed with the patient and family. After consideration of risks, benefits and other options for treatment, the patient has consented to  Procedure(s): LOWER EXTREMITY ANGIOGRAPHY (Left) as a surgical intervention.  The patient's history has been reviewed, patient examined, no change in status, stable for surgery.  I have reviewed the patient's chart and labs.  Questions were answered to the patient's satisfaction.     Leotis Pain

## 2021-11-07 ENCOUNTER — Encounter: Payer: Self-pay | Admitting: Vascular Surgery

## 2021-11-10 LAB — GLUCOSE, CAPILLARY: Glucose-Capillary: 40 mg/dL — CL (ref 70–99)

## 2021-11-14 ENCOUNTER — Emergency Department
Admission: EM | Admit: 2021-11-14 | Discharge: 2021-11-15 | Disposition: A | Discharge status: Home / Self Care | Payer: Medicare HMO | Attending: Emergency Medicine | Admitting: Emergency Medicine

## 2021-11-14 ENCOUNTER — Other Ambulatory Visit: Payer: Self-pay

## 2021-11-14 DIAGNOSIS — E162 Hypoglycemia, unspecified: Secondary | ICD-10-CM

## 2021-11-14 DIAGNOSIS — K529 Noninfective gastroenteritis and colitis, unspecified: Secondary | ICD-10-CM | POA: Diagnosis not present

## 2021-11-14 DIAGNOSIS — Z20822 Contact with and (suspected) exposure to covid-19: Secondary | ICD-10-CM | POA: Diagnosis not present

## 2021-11-14 DIAGNOSIS — R7309 Other abnormal glucose: Secondary | ICD-10-CM | POA: Diagnosis not present

## 2021-11-14 DIAGNOSIS — R109 Unspecified abdominal pain: Secondary | ICD-10-CM | POA: Diagnosis present

## 2021-11-14 DIAGNOSIS — R404 Transient alteration of awareness: Secondary | ICD-10-CM

## 2021-11-14 LAB — CBC WITH DIFFERENTIAL/PLATELET
Abs Immature Granulocytes: 0.17 10*3/uL — ABNORMAL HIGH (ref 0.00–0.07)
Basophils Absolute: 0.1 10*3/uL (ref 0.0–0.1)
Basophils Relative: 0 %
Eosinophils Absolute: 0.1 10*3/uL (ref 0.0–0.5)
Eosinophils Relative: 0 %
HCT: 36.1 % (ref 36.0–46.0)
Hemoglobin: 11.1 g/dL — ABNORMAL LOW (ref 12.0–15.0)
Immature Granulocytes: 1 %
Lymphocytes Relative: 11 %
Lymphs Abs: 1.9 10*3/uL (ref 0.7–4.0)
MCH: 28.2 pg (ref 26.0–34.0)
MCHC: 30.7 g/dL (ref 30.0–36.0)
MCV: 91.6 fL (ref 80.0–100.0)
Monocytes Absolute: 0.8 10*3/uL (ref 0.1–1.0)
Monocytes Relative: 5 %
Neutro Abs: 13.7 10*3/uL — ABNORMAL HIGH (ref 1.7–7.7)
Neutrophils Relative %: 83 %
Platelets: 474 10*3/uL — ABNORMAL HIGH (ref 150–400)
RBC: 3.94 MIL/uL (ref 3.87–5.11)
RDW: 14.2 % (ref 11.5–15.5)
WBC: 16.7 10*3/uL — ABNORMAL HIGH (ref 4.0–10.5)
nRBC: 0 % (ref 0.0–0.2)

## 2021-11-14 LAB — COMPREHENSIVE METABOLIC PANEL
ALT: 14 U/L (ref 0–44)
AST: 12 U/L — ABNORMAL LOW (ref 15–41)
Albumin: 3.1 g/dL — ABNORMAL LOW (ref 3.5–5.0)
Alkaline Phosphatase: 143 U/L — ABNORMAL HIGH (ref 38–126)
Anion gap: 6 (ref 5–15)
BUN: 35 mg/dL — ABNORMAL HIGH (ref 8–23)
CO2: 24 mmol/L (ref 22–32)
Calcium: 8.7 mg/dL — ABNORMAL LOW (ref 8.9–10.3)
Chloride: 105 mmol/L (ref 98–111)
Creatinine, Ser: 1.21 mg/dL — ABNORMAL HIGH (ref 0.44–1.00)
GFR, Estimated: 44 mL/min — ABNORMAL LOW (ref >60)
Glucose, Bld: 223 mg/dL — ABNORMAL HIGH (ref 70–99)
Potassium: 3.9 mmol/L (ref 3.5–5.1)
Sodium: 135 mmol/L (ref 135–145)
Total Bilirubin: 0.3 mg/dL (ref 0.3–1.2)
Total Protein: 6.3 g/dL — ABNORMAL LOW (ref 6.5–8.1)

## 2021-11-14 LAB — URINALYSIS, ROUTINE W REFLEX MICROSCOPIC
Bilirubin Urine: NEGATIVE
Glucose, UA: 150 mg/dL — AB
Hgb urine dipstick: NEGATIVE
Ketones, ur: NEGATIVE mg/dL
Leukocytes,Ua: NEGATIVE
Nitrite: NEGATIVE
Protein, ur: NEGATIVE mg/dL
Specific Gravity, Urine: 1.019 (ref 1.005–1.030)
pH: 5 (ref 5.0–8.0)

## 2021-11-14 LAB — CBG MONITORING, ED: Glucose-Capillary: 153 mg/dL — ABNORMAL HIGH (ref 70–99)

## 2021-11-14 NOTE — ED Notes (Signed)
CBG 153 at this time

## 2021-11-14 NOTE — Discharge Instructions (Signed)
Please return her insulin dosing to 25 units per dose until follow-up with your primary care physician

## 2021-11-14 NOTE — ED Notes (Signed)
Provider notified of pts repeat rectal temp. Provider aware. No new orders at this time

## 2021-11-14 NOTE — ED Triage Notes (Signed)
Pt presents to ER via ems from home.  Per ems, they were called out for unresponsive pt.  On arrival, ems states cbg was 37.  Ems states they gave 350 cc D10 w/improvement to 256 on last check.  Pt responsive and can follow commands.  Unknown what pts baseline mental status is.  Pt currently A&O x2.

## 2021-11-14 NOTE — ED Provider Notes (Signed)
[facility] Provider Note    Event Date/Time   First MD Initiated Contact with Patient 11/14/21 1902     (approximate)   History   Hypoglycemia   HPI Beth Mcguire is a 81 y.o. female with a past medical history of type 2 diabetes and a recent femoral vascular stenting who presents for hypoglycemia at home today.  Patient's caregiver is at bedside and provides most of this history including the fact that patient took her evening dose of insulin and then did not have any p.o. intake.  Patient states that this commonly occurs with patient when she takes her insulin and does not eat including before her vascular stenting procedure as well.  Patient received 3 and 50 cc of D10 with improvement of her glucose to 256 on the last check.  Per EMS, patient was initially unresponsive and returned to baseline after the D10 infusion.  Patient awake, alert x2, and denies any complaints at this time     Physical Exam   Triage Vital Signs: ED Triage Vitals  Enc Vitals Group     BP 11/14/21 1908 (!) 198/60     Pulse Rate 11/14/21 1908 69     Resp 11/14/21 1908 (!) 21     Temp 11/14/21 1917 (!) 93.9 F (34.4 C)     Temp Source 11/14/21 1917 Rectal     SpO2 11/14/21 1908 100 %     Weight 11/14/21 1906 147 lb 11.3 oz (67 kg)     Height 11/14/21 1906 5\' 3"  (1.6 m)     Head Circumference --      Peak Flow --      Pain Score 11/14/21 1906 0     Pain Loc --      Pain Edu? --      Excl. in [place]? --     Most recent vital signs: Vitals:   11/14/21 2327 11/15/21 0003  BP:  (!) 150/64  Pulse:  64  Resp:  15  Temp: (!) 93.7 F (34.3 C) 98 F (36.7 C)  SpO2:  95%    General: Awake, oriented to person and place CV:  Good peripheral perfusion.  Resp:  Normal effort.  Abd:  No distention.  Other:  Chronically ill-appearing elderly Caucasian female laying in bed in no distress   ED Results / Procedures / Treatments   Labs (all labs ordered are listed, but only  abnormal results are displayed) Labs Reviewed  RESP PANEL BY RT-PCR (FLU A&B, COVID) ARPGX2 - Abnormal; Notable for the following components:      Result Value   SARS Coronavirus 2 by RT PCR POSITIVE (*)    All other components within normal limits  COMPREHENSIVE METABOLIC PANEL - Abnormal; Notable for the following components:   Glucose, Bld 223 (*)    BUN 35 (*)    Creatinine, Ser 1.21 (*)    Calcium 8.7 (*)    Total Protein 6.3 (*)    Albumin 3.1 (*)    AST 12 (*)    Alkaline Phosphatase 143 (*)    GFR, Estimated 44 (*)    All other components within normal limits  CBC WITH DIFFERENTIAL/PLATELET - Abnormal; Notable for the following components:   WBC 16.7 (*)    Hemoglobin 11.1 (*)    Platelets 474 (*)    Neutro Abs 13.7 (*)    Abs Immature Granulocytes 0.17 (*)    All other components within normal limits  URINALYSIS, ROUTINE  W REFLEX MICROSCOPIC - Abnormal; Notable for the following components:   Color, Urine YELLOW (*)    APPearance CLEAR (*)    Glucose, UA 150 (*)    All other components within normal limits  CBG MONITORING, ED - Abnormal; Notable for the following components:   Glucose-Capillary 153 (*)    All other components within normal limits     EKG ED ECG REPORT I, Naaman Plummer, the attending physician, personally viewed and interpreted this ECG.  Date: 11/14/2021 EKG Time: 1909 Rate: 64 Rhythm: normal sinus rhythm QRS Axis: normal Intervals: normal ST/T Wave abnormalities: normal Narrative Interpretation: no evidence of acute ischemia  PROCEDURES:  Critical Care performed: No  Procedures   MEDICATIONS ORDERED IN ED: Medications - No data to display   IMPRESSION / MDM / [place] / ED COURSE  I reviewed the triage vital signs and the nursing notes.                              Differential diagnosis includes, but is not limited to, hypoglycemia, medication nonadherence, poor p.o. intake, insulinoma  The patient is on the  cardiac monitor to evaluate for evidence of arrhythmia and/or significant heart rate changes.  Type II diabetic presents BIBA for hypoglycemia  Given History, Exam, and Workup I have a low suspicion for sepsis induced hypoglycemia, long lasting medication overdose, suicidal ideation/purposeful overdose attempt.  Reassessment: Patient's blood sugar significantly improved after p.o. intake and remained stable throughout emergency department course.  Patient does show evidence of COVID positivity with a white blood cell count of 16.  Patient is not requiring any supplemental oxygen at this time respiratory rate is normal without any signs of respiratory distress. Urinalysis shows no evidence of acute urinary tract infection After observation in the emergency department for several hours, the Blood sugar has remained stable, and based on the history is expected that the Blood sugar should remain stable after discharge. Disposition: Plan discharge home with close primary care follow-up   FINAL CLINICAL IMPRESSION(S) / ED DIAGNOSES   Final diagnoses:  Hypoglycemia  Transient alteration of awareness     Rx / DC Orders   ED Discharge Orders     None        Note:  This document was prepared using Dragon voice recognition software and may include unintentional dictation errors.   Naaman Plummer, MD 11/15/21 2039

## 2021-11-14 NOTE — ED Notes (Signed)
Daughter arrives at bedside

## 2021-11-15 LAB — RESP PANEL BY RT-PCR (FLU A&B, COVID) ARPGX2
Influenza A by PCR: NEGATIVE
Influenza B by PCR: NEGATIVE
SARS Coronavirus 2 by RT PCR: POSITIVE — AB

## 2021-11-24 ENCOUNTER — Inpatient Hospital Stay: Payer: Medicare HMO

## 2021-11-24 ENCOUNTER — Other Ambulatory Visit: Payer: Self-pay

## 2021-11-24 ENCOUNTER — Inpatient Hospital Stay
Admission: EM | Admit: 2021-11-24 | Discharge: 2021-11-29 | DRG: 617 | Disposition: A | Discharge status: Home / Self Care | Payer: Medicare HMO | Attending: Internal Medicine | Admitting: Internal Medicine

## 2021-11-24 DIAGNOSIS — N182 Chronic kidney disease, stage 2 (mild): Secondary | ICD-10-CM | POA: Diagnosis present

## 2021-11-24 DIAGNOSIS — L97529 Non-pressure chronic ulcer of other part of left foot with unspecified severity: Secondary | ICD-10-CM | POA: Diagnosis present

## 2021-11-24 DIAGNOSIS — R609 Edema, unspecified: Secondary | ICD-10-CM | POA: Diagnosis present

## 2021-11-24 DIAGNOSIS — E1169 Type 2 diabetes mellitus with other specified complication: Secondary | ICD-10-CM | POA: Diagnosis present

## 2021-11-24 DIAGNOSIS — R410 Disorientation, unspecified: Secondary | ICD-10-CM | POA: Diagnosis not present

## 2021-11-24 DIAGNOSIS — M86172 Other acute osteomyelitis, left ankle and foot: Secondary | ICD-10-CM | POA: Diagnosis present

## 2021-11-24 DIAGNOSIS — U071 COVID-19: Secondary | ICD-10-CM | POA: Diagnosis not present

## 2021-11-24 DIAGNOSIS — Z89431 Acquired absence of right foot: Secondary | ICD-10-CM

## 2021-11-24 DIAGNOSIS — Z833 Family history of diabetes mellitus: Secondary | ICD-10-CM

## 2021-11-24 DIAGNOSIS — R21 Rash and other nonspecific skin eruption: Secondary | ICD-10-CM | POA: Diagnosis not present

## 2021-11-24 DIAGNOSIS — T368X5A Adverse effect of other systemic antibiotics, initial encounter: Secondary | ICD-10-CM | POA: Diagnosis not present

## 2021-11-24 DIAGNOSIS — Y92239 Unspecified place in hospital as the place of occurrence of the external cause: Secondary | ICD-10-CM | POA: Diagnosis not present

## 2021-11-24 DIAGNOSIS — I129 Hypertensive chronic kidney disease with stage 1 through stage 4 chronic kidney disease, or unspecified chronic kidney disease: Secondary | ICD-10-CM | POA: Diagnosis present

## 2021-11-24 DIAGNOSIS — Z8616 Personal history of COVID-19: Secondary | ICD-10-CM | POA: Diagnosis not present

## 2021-11-24 DIAGNOSIS — Z7982 Long term (current) use of aspirin: Secondary | ICD-10-CM | POA: Diagnosis not present

## 2021-11-24 DIAGNOSIS — Z9049 Acquired absence of other specified parts of digestive tract: Secondary | ICD-10-CM | POA: Diagnosis not present

## 2021-11-24 DIAGNOSIS — Z79899 Other long term (current) drug therapy: Secondary | ICD-10-CM

## 2021-11-24 DIAGNOSIS — E1140 Type 2 diabetes mellitus with diabetic neuropathy, unspecified: Secondary | ICD-10-CM | POA: Diagnosis present

## 2021-11-24 DIAGNOSIS — M869 Osteomyelitis, unspecified: Secondary | ICD-10-CM | POA: Diagnosis present

## 2021-11-24 DIAGNOSIS — E11649 Type 2 diabetes mellitus with hypoglycemia without coma: Secondary | ICD-10-CM | POA: Diagnosis present

## 2021-11-24 DIAGNOSIS — Z794 Long term (current) use of insulin: Secondary | ICD-10-CM

## 2021-11-24 DIAGNOSIS — I739 Peripheral vascular disease, unspecified: Secondary | ICD-10-CM | POA: Diagnosis not present

## 2021-11-24 DIAGNOSIS — F0390 Unspecified dementia without behavioral disturbance: Secondary | ICD-10-CM | POA: Diagnosis present

## 2021-11-24 DIAGNOSIS — E1151 Type 2 diabetes mellitus with diabetic peripheral angiopathy without gangrene: Secondary | ICD-10-CM | POA: Diagnosis present

## 2021-11-24 DIAGNOSIS — L089 Local infection of the skin and subcutaneous tissue, unspecified: Secondary | ICD-10-CM | POA: Diagnosis not present

## 2021-11-24 DIAGNOSIS — L270 Generalized skin eruption due to drugs and medicaments taken internally: Secondary | ICD-10-CM | POA: Diagnosis not present

## 2021-11-24 DIAGNOSIS — Z95820 Peripheral vascular angioplasty status with implants and grafts: Secondary | ICD-10-CM | POA: Diagnosis not present

## 2021-11-24 DIAGNOSIS — E11621 Type 2 diabetes mellitus with foot ulcer: Secondary | ICD-10-CM | POA: Diagnosis present

## 2021-11-24 DIAGNOSIS — Z7902 Long term (current) use of antithrombotics/antiplatelets: Secondary | ICD-10-CM | POA: Diagnosis not present

## 2021-11-24 DIAGNOSIS — E785 Hyperlipidemia, unspecified: Secondary | ICD-10-CM | POA: Diagnosis present

## 2021-11-24 DIAGNOSIS — Z79890 Hormone replacement therapy (postmenopausal): Secondary | ICD-10-CM

## 2021-11-24 DIAGNOSIS — E1122 Type 2 diabetes mellitus with diabetic chronic kidney disease: Secondary | ICD-10-CM | POA: Diagnosis present

## 2021-11-24 DIAGNOSIS — I10 Essential (primary) hypertension: Secondary | ICD-10-CM | POA: Diagnosis not present

## 2021-11-24 DIAGNOSIS — Z85038 Personal history of other malignant neoplasm of large intestine: Secondary | ICD-10-CM | POA: Diagnosis not present

## 2021-11-24 DIAGNOSIS — R338 Other retention of urine: Secondary | ICD-10-CM | POA: Clinically undetermined

## 2021-11-24 DIAGNOSIS — E11628 Type 2 diabetes mellitus with other skin complications: Secondary | ICD-10-CM | POA: Diagnosis not present

## 2021-11-24 DIAGNOSIS — E1142 Type 2 diabetes mellitus with diabetic polyneuropathy: Secondary | ICD-10-CM | POA: Diagnosis not present

## 2021-11-24 DIAGNOSIS — M86672 Other chronic osteomyelitis, left ankle and foot: Secondary | ICD-10-CM | POA: Diagnosis not present

## 2021-11-24 DIAGNOSIS — Z880 Allergy status to penicillin: Secondary | ICD-10-CM

## 2021-11-24 DIAGNOSIS — F419 Anxiety disorder, unspecified: Secondary | ICD-10-CM | POA: Diagnosis present

## 2021-11-24 DIAGNOSIS — M86071 Acute hematogenous osteomyelitis, right ankle and foot: Secondary | ICD-10-CM | POA: Diagnosis not present

## 2021-11-24 LAB — CBC WITH DIFFERENTIAL/PLATELET
Abs Immature Granulocytes: 0.07 10*3/uL (ref 0.00–0.07)
Basophils Absolute: 0.1 10*3/uL (ref 0.0–0.1)
Basophils Relative: 1 %
Eosinophils Absolute: 0.3 10*3/uL (ref 0.0–0.5)
Eosinophils Relative: 3 %
HCT: 39.8 % (ref 36.0–46.0)
Hemoglobin: 12.3 g/dL (ref 12.0–15.0)
Immature Granulocytes: 1 %
Lymphocytes Relative: 25 %
Lymphs Abs: 2.8 10*3/uL (ref 0.7–4.0)
MCH: 28 pg (ref 26.0–34.0)
MCHC: 30.9 g/dL (ref 30.0–36.0)
MCV: 90.5 fL (ref 80.0–100.0)
Monocytes Absolute: 0.5 10*3/uL (ref 0.1–1.0)
Monocytes Relative: 5 %
Neutro Abs: 7.3 10*3/uL (ref 1.7–7.7)
Neutrophils Relative %: 65 %
Platelets: 353 10*3/uL (ref 150–400)
RBC: 4.4 MIL/uL (ref 3.87–5.11)
RDW: 15.2 % (ref 11.5–15.5)
WBC: 11.2 10*3/uL — ABNORMAL HIGH (ref 4.0–10.5)
nRBC: 0 % (ref 0.0–0.2)

## 2021-11-24 LAB — COMPREHENSIVE METABOLIC PANEL
ALT: 12 U/L (ref 0–44)
AST: 17 U/L (ref 15–41)
Albumin: 3.2 g/dL — ABNORMAL LOW (ref 3.5–5.0)
Alkaline Phosphatase: 130 U/L — ABNORMAL HIGH (ref 38–126)
Anion gap: 6 (ref 5–15)
BUN: 26 mg/dL — ABNORMAL HIGH (ref 8–23)
CO2: 24 mmol/L (ref 22–32)
Calcium: 9.1 mg/dL (ref 8.9–10.3)
Chloride: 108 mmol/L (ref 98–111)
Creatinine, Ser: 1.14 mg/dL — ABNORMAL HIGH (ref 0.44–1.00)
GFR, Estimated: 48 mL/min — ABNORMAL LOW (ref >60)
Glucose, Bld: 156 mg/dL — ABNORMAL HIGH (ref 70–99)
Potassium: 4.7 mmol/L (ref 3.5–5.1)
Sodium: 138 mmol/L (ref 135–145)
Total Bilirubin: 0.5 mg/dL (ref 0.3–1.2)
Total Protein: 6.7 g/dL (ref 6.5–8.1)

## 2021-11-24 LAB — SURGICAL PCR SCREEN
MRSA, PCR: NEGATIVE
Staphylococcus aureus: NEGATIVE

## 2021-11-24 LAB — GLUCOSE, CAPILLARY
Glucose-Capillary: 179 mg/dL — ABNORMAL HIGH (ref 70–99)
Glucose-Capillary: 171 mg/dL — ABNORMAL HIGH (ref 70–99)

## 2021-11-24 LAB — HEMOGLOBIN A1C
Hgb A1c MFr Bld: 6.7 % — ABNORMAL HIGH (ref 4.8–5.6)
Mean Plasma Glucose: 145.59 mg/dL

## 2021-11-24 LAB — RESP PANEL BY RT-PCR (FLU A&B, COVID) ARPGX2
Influenza A by PCR: NEGATIVE
Influenza B by PCR: NEGATIVE
SARS Coronavirus 2 by RT PCR: POSITIVE — AB

## 2021-11-24 MED ORDER — SODIUM CHLORIDE 0.9 % IV SOLN: INTRAVENOUS | Status: DC | PRN

## 2021-11-24 MED ORDER — HYDROMORPHONE HCL 1 MG/ML IJ SOLN: 0.5000 mg | INTRAMUSCULAR | Status: DC | PRN

## 2021-11-24 MED ORDER — METFORMIN HCL ER 500 MG PO TB24
500.0000 mg | ORAL_TABLET | Freq: Two times a day (BID) | ORAL | Status: DC
  Administered 2021-11-24: 500 mg via ORAL
  Filled 2021-11-24 (×2): qty 1

## 2021-11-24 MED ORDER — BISACODYL 5 MG PO TBEC: 5.0000 mg | DELAYED_RELEASE_TABLET | Freq: Every day | ORAL | Status: DC | PRN

## 2021-11-24 MED ORDER — METRONIDAZOLE 500 MG/100ML IV SOLN
500.0000 mg | Freq: Once | INTRAVENOUS | Status: AC
  Administered 2021-11-24: 500 mg via INTRAVENOUS
  Filled 2021-11-24: qty 100

## 2021-11-24 MED ORDER — ENOXAPARIN SODIUM 40 MG/0.4ML IJ SOSY
40.0000 mg | PREFILLED_SYRINGE | INTRAMUSCULAR | Status: DC
  Administered 2021-11-24 – 2021-11-28 (×5): 40 mg via SUBCUTANEOUS
  Filled 2021-11-24 (×5): qty 0.4

## 2021-11-24 MED ORDER — ASPIRIN EC 81 MG PO TBEC
81.0000 mg | DELAYED_RELEASE_TABLET | Freq: Every day | ORAL | Status: DC
  Administered 2021-11-24 – 2021-11-29 (×6): 81 mg via ORAL
  Filled 2021-11-24 (×6): qty 1

## 2021-11-24 MED ORDER — LINEZOLID 600 MG/300ML IV SOLN
600.0000 mg | Freq: Two times a day (BID) | INTRAVENOUS | Status: DC
  Filled 2021-11-24: qty 300

## 2021-11-24 MED ORDER — VANCOMYCIN HCL IN DEXTROSE 1-5 GM/200ML-% IV SOLN
1000.0000 mg | Freq: Once | INTRAVENOUS | Status: AC
  Administered 2021-11-24: 1000 mg via INTRAVENOUS
  Filled 2021-11-24: qty 200

## 2021-11-24 MED ORDER — ONDANSETRON HCL 4 MG/2ML IJ SOLN: 4.0000 mg | Freq: Four times a day (QID) | INTRAMUSCULAR | Status: DC | PRN

## 2021-11-24 MED ORDER — SODIUM CHLORIDE 0.9 % IV SOLN
2.0000 g | Freq: Two times a day (BID) | INTRAVENOUS | Status: DC
  Filled 2021-11-24 (×2): qty 2

## 2021-11-24 MED ORDER — INSULIN ASPART 100 UNIT/ML IJ SOLN
0.0000 [IU] | Freq: Three times a day (TID) | INTRAMUSCULAR | Status: DC
  Administered 2021-11-24 – 2021-11-25 (×2): 2 [IU] via SUBCUTANEOUS
  Administered 2021-11-25: 5 [IU] via SUBCUTANEOUS
  Filled 2021-11-24 (×3): qty 1

## 2021-11-24 MED ORDER — LISINOPRIL 20 MG PO TABS
20.0000 mg | ORAL_TABLET | Freq: Every day | ORAL | Status: DC
  Administered 2021-11-25 – 2021-11-26 (×2): 20 mg via ORAL
  Filled 2021-11-24 (×2): qty 1

## 2021-11-24 MED ORDER — SODIUM CHLORIDE 0.9 % IV SOLN
2.0000 g | Freq: Once | INTRAVENOUS | Status: AC
  Administered 2021-11-24: 2 g via INTRAVENOUS
  Filled 2021-11-24: qty 2

## 2021-11-24 MED ORDER — SODIUM CHLORIDE 0.9 % IV SOLN
3.0000 g | Freq: Four times a day (QID) | INTRAVENOUS | Status: DC
  Administered 2021-11-24 – 2021-11-26 (×6): 3 g via INTRAVENOUS
  Filled 2021-11-24: qty 3
  Filled 2021-11-24: qty 8
  Filled 2021-11-24 (×2): qty 3
  Filled 2021-11-24 (×4): qty 8
  Filled 2021-11-24: qty 3

## 2021-11-24 MED ORDER — ONDANSETRON HCL 4 MG PO TABS
4.0000 mg | ORAL_TABLET | Freq: Four times a day (QID) | ORAL | Status: DC | PRN
Start: 2021-11-24 — End: 2021-11-29

## 2021-11-24 MED ORDER — ATORVASTATIN CALCIUM 10 MG PO TABS
10.0000 mg | ORAL_TABLET | Freq: Every evening | ORAL | Status: DC
  Administered 2021-11-24 – 2021-11-28 (×5): 10 mg via ORAL
  Filled 2021-11-24 (×6): qty 1

## 2021-11-24 NOTE — ED Notes (Signed)
Pt's L foot currently wrapped in gauze; pink; family states they have been addressing wound at home cleansing it with iodide; primary doctor assessed and dressed site yesterday per family with non-adhesive dressing and gauze on top. States wound has been oozing puss-like drainage and has been a problem since October 2022. Pt denies recent fever. Pt laying calmly on stretcher; skin dry; resp reg/unlabored. Call bell within reach; stretcher locked low; rail up; family at bedside.

## 2021-11-24 NOTE — ED Notes (Signed)
Informed RN bed assigned 

## 2021-11-24 NOTE — Progress Notes (Addendum)
Pharmacy Antibiotic Note  Beth Mcguire is a 81 y.o. female admitted on 11/24/2021 with  wound infection .  Pharmacy has been consulted for Unasyn dosing. Pt has noted allergy to Augmentin; MD is aware and daughter has agreed with Unasyn. Nurse to monitor for allergic reaction.   Plan: Unasyn 3 g IV q6h Monitor renal function and adjust dose as clinically indicated     Temp (24hrs), Avg:97.9 F (36.6 C), Min:97.7 F (36.5 C), Max:98.2 F (36.8 C)  Recent Labs  Lab 11/24/21 1039  WBC 11.2*  CREATININE 1.14*    Estimated Creatinine Clearance: 34.4 mL/min (A) (by C-G formula based on SCr of 1.14 mg/dL (H)).    Allergies  Allergen Reactions   Amoxicillin-Pot Clavulanate Rash    Antimicrobials this admission: 2/17 cefepime, metronidazole and vanc x 1 2/17 Unasyn >>    Thank you for allowing pharmacy to be a part of this patients care.  Forde Dandy Lan [place] 11/24/2021 4:16 PM

## 2021-11-24 NOTE — H&P (Addendum)
History and Physical    Beth Mcguire:[id number] DOB: Jun 15, 1941 DOA: 11/24/2021  PCP: [name], MD (Confirm with patient/family/NH records and if not entered, this has to be entered at [facility] point of entry) Patient coming from: Home  I have personally briefly reviewed patient's old medical records in [place]  Chief Complaint: Left foot wound and swelling  HPI: Beth Mcguire is a 81 y.o. 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  Patient has a chronic left foot wound, recently became infected and underwent outpatient antibiotic treatment, today is day 12, treatment including 10 days course of doxycycline which she completed 2 days ago, and patient started on Flagyl 2 days ago.  Despite, podiatry evaluated patient's wound today and there was infection has become uncontrolled, sent her to the ED.  She denies any fever chills, but complains about increasing of left foot pain for last 2 days.  Daughter at bedside also reported worsening of swelling of the left foot.  3 weeks ago, patient underwent peripheral catheterization, and stent placed in in the left SFA.  She takes aspirin and Plavix, as per podiatrist recommendation, Plavix on hold for scheduled surgery on Sunday.  Patient is insulin-dependent, daughter reported recent frequent episodes of hypoglycemia in the mornings, range from 60-80s.  Patient takes 70/30 twice daily, current usual at bedtime finge stick reading is around 200.  ED Course: Patient was found afebrile, blood pressure stable no tachycardia.  WBC 11.  Creatinine 1.14 above baseline.  Left foot x-ray showed concerning signs of osteomyelitis on first metatarsal.  Review of Systems: As per HPI otherwise 14 point review of systems negative.    Past Medical History:  Diagnosis Date    Anxiety    Colon cancer ([place])    Dementia ([place]) 2022   Diabetes ([place])    Hypertension    Peripheral vascular disease ([place])     Past Surgical History:  Procedure Laterality Date   AMPUTATION Right 01/14/2016   Procedure: AMPUTATION RAY;  Surgeon: Albertine Patricia, DPM;  Location: [facility] ORS;  Service: Podiatry;  Laterality: Right;   COLON SURGERY     LOWER EXTREMITY ANGIOGRAPHY Left 08/14/2017   Procedure: Lower Extremity Angiography;  Surgeon: Algernon Huxley, MD;  Location: [place] [facility];  Service: Cardiovascular;  Laterality: Left;   LOWER EXTREMITY ANGIOGRAPHY Left 11/06/2021   Procedure: LOWER EXTREMITY ANGIOGRAPHY;  Surgeon: Algernon Huxley, MD;  Location: [place] [facility];  Service: Cardiovascular;  Laterality: Left;   PERIPHERAL VASCULAR CATHETERIZATION N/A 01/05/2016   Procedure: Lower Extremity Angiography;  Surgeon: Algernon Huxley, MD;  Location: [place] [facility];  Service: Cardiovascular;  Laterality: N/A;   PERIPHERAL VASCULAR CATHETERIZATION  01/05/2016   Procedure: Lower Extremity Intervention;  Surgeon: Algernon Huxley, MD;  Location: [place] [facility];  Service: Cardiovascular;;   PERIPHERAL VASCULAR CATHETERIZATION Right 06/25/2016   Procedure: Lower Extremity Angiography;  Surgeon: Algernon Huxley, MD;  Location: [place] [facility];  Service: Cardiovascular;  Laterality: Right;   PERIPHERAL VASCULAR CATHETERIZATION  06/25/2016   Procedure: Lower Extremity Intervention;  Surgeon: Algernon Huxley, MD;  Location: [place] [facility];  Service: Cardiovascular;;   TONSILLECTOMY       reports that she has never smoked. She has never used smokeless tobacco. She reports that she does not drink  alcohol and does not use drugs.  Allergies  Allergen Reactions   Amoxicillin-Pot Clavulanate Rash    Family History  Problem Relation Age of Onset   Diabetes Other      Prior to Admission medications   Medication Sig Start Date End Date Taking? Authorizing Provider  aspirin 81 MG  tablet Take 1 tablet (81 mg total) by mouth daily. 01/06/16   Hillary Bow, MD  atorvastatin (LIPITOR) 10 MG tablet Take 10 mg every evening by mouth.  02/08/16   [provider]  B-D ULTRAFINE III SHORT PEN 31G X 8 MM MISC  08/15/17   [provider]  Blood Glucose Monitoring Suppl (ONE TOUCH ULTRA MINI) w/Device KIT  07/23/16   [provider]  clopidogrel (PLAVIX) 75 MG tablet TAKE 1 TABLET BY MOUTH EVERY DAY 09/22/20   Kris Hartmann, NP  glucose monitoring kit (FREESTYLE) monitoring kit 1 each by Does not apply route 4 (four) times daily -  before meals and at bedtime. 01/06/16   Hillary Bow, MD  lisinopril (PRINIVIL,ZESTRIL) 20 MG tablet Take 20 mg by mouth daily.    [provider]  metFORMIN (GLUCOPHAGE-XR) 500 MG 24 hr tablet take 1 tablet by mouth once daily with DINNER 08/15/17   [provider]  NOVOLOG MIX 70/30 FLEXPEN (70-30) 100 UNIT/ML FlexPen Inject 25 Units into the skin 2 (two) times daily before a meal. 10/16/21   [provider]  ONE TOUCH ULTRA TEST test strip  07/23/16   [provider]  Jonetta Speak LANCETS 09G [place]  08/30/17   [provider]    Physical Exam: Vitals:   11/24/21 1147 11/24/21 1200 11/24/21 1218 11/24/21 1230  BP: (!) 148/44 (!) 127/32  (!) 145/46  Pulse: 65 65 63   Resp: 18     Temp:      TempSrc:      SpO2: 97% 96% 96%     Constitutional: NAD, calm, comfortable Vitals:   11/24/21 1147 11/24/21 1200 11/24/21 1218 11/24/21 1230  BP: (!) 148/44 (!) 127/32  (!) 145/46  Pulse: 65 65 63   Resp: 18     Temp:      TempSrc:      SpO2: 97% 96% 96%    Eyes: PERRL, lids and conjunctivae normal ENMT: Mucous membranes are moist. Posterior pharynx clear of any exudate or lesions.Normal dentition.  Neck: normal, supple, no masses, no thyromegaly Respiratory: clear to auscultation bilaterally, no wheezing, no crackles. Normal respiratory effort. No accessory muscle use.   Cardiovascular: Regular rate and rhythm, no murmurs / rubs / gallops. No extremity edema. 2+ pedal pulses. No carotid bruits.  Abdomen: no tenderness, no masses palpated. No hepatosplenomegaly. Bowel sounds positive.  Musculoskeletal: no clubbing / cyanosis. No joint deformity upper and lower extremities. Good ROM, no contractures. Normal muscle tone.  Skin: Large ulcer on the bottom left foot near mid-metatarsal, 2+ pitting edema of left ankle Neurologic: CN 2-12 grossly intact. Sensation intact, DTR normal. Strength 5/5 in all 4.  Psychiatric: Normal judgment and insight. Alert and oriented x 3. Normal mood.     Labs on Admission: I have personally reviewed following labs and imaging studies  CBC: Recent Labs  Lab 11/24/21 1039  WBC 11.2*  NEUTROABS 7.3  HGB 12.3  HCT 39.8  MCV 90.5  PLT 283   Basic Metabolic Panel: Recent Labs  Lab 11/24/21 1039  NA 138  K 4.7  CL 108  CO2 24  GLUCOSE 156*  BUN 26*  CREATININE 1.14*  CALCIUM 9.1   GFR: Estimated Creatinine Clearance: 34.4 mL/min (A) (by C-G formula based on SCr of 1.14 mg/dL (H)). Liver Function Tests: Recent Labs  Lab 11/24/21 1039  AST 17  ALT 12  ALKPHOS 130*  BILITOT 0.5  PROT 6.7  ALBUMIN 3.2*   No results for input(s): LIPASE, AMYLASE in the last 168 hours. No results for input(s): AMMONIA in the last 168 hours. Coagulation Profile: No results for input(s): INR, PROTIME in the last 168 hours. Cardiac Enzymes: No results for input(s): CKTOTAL, CKMB, CKMBINDEX, TROPONINI in the last 168 hours. BNP (last 3 results) No results for input(s): PROBNP in the last 8760 hours. HbA1C: No results for input(s): HGBA1C in the last 72 hours. CBG: No results for input(s): GLUCAP in the last 168 hours. Lipid Profile: No results for input(s): CHOL, HDL, LDLCALC, TRIG, CHOLHDL, LDLDIRECT in the last 72 hours. Thyroid Function Tests: No results for input(s): TSH, T4TOTAL, FREET4, T3FREE, THYROIDAB in the last 72  hours. Anemia Panel: No results for input(s): VITAMINB12, FOLATE, FERRITIN, TIBC, IRON, RETICCTPCT in the last 72 hours. Urine analysis:    Component Value Date/Time   COLORURINE YELLOW (A) 11/14/2021 2017   APPEARANCEUR CLEAR (A) 11/14/2021 2017   LABSPEC 1.019 11/14/2021 2017   PHURINE 5.0 11/14/2021 2017   GLUCOSEU 150 (A) 11/14/2021 2017   HGBUR NEGATIVE 11/14/2021 2017   BILIRUBINUR NEGATIVE 11/14/2021 2017   KETONESUR NEGATIVE 11/14/2021 2017   PROTEINUR NEGATIVE 11/14/2021 2017   NITRITE NEGATIVE 11/14/2021 2017   LEUKOCYTESUR NEGATIVE 11/14/2021 2017    Radiological Exams on Admission: No results found.  EKG: Independently reviewed.  Sinus, no acute ST changes.  Assessment/Plan Principal Problem:   Osteomyelitis ([place]) Active Problems:   Foot osteomyelitis, right ([place])  (please populate well all problems here in Problem List. (For example, if patient is on BP meds at home and you resume or decide to hold them, it is a problem that needs to be her. Same for CAD, COPD, HLD and so on)  Left foot infected diabetic ulcers, suspicious for underlying osteomyelitis, failed outpatient p.o. antibiotic treatment. -As per podiatry recommendation, on broad-spectrum antibiotics combination of vancomycin cefepime and Flagyl -Podiatry not recommending MRI given clear finding on x-ray for osteomyelitis. -Continue to hold Plavix as per podiatrist recommendation, scheduled for surgery on Sunday. -DVT study  PVD with recent left SFA stenting -Continue aspirin and statin.  IDDM with episodic hypoglycemia -Recommend cut down at bedtime 25 units 70/30, for now we will do sliding scale. -Continue metformin.  HTN -Continue losartan.  DVT prophylaxis: Lovenox Code Status: Full code Family Communication: Daughter at bedside Disposition Plan: Patient is sick with failed outpatient treatment for osteomyelitis, needing IV antibiotics, suspect OR time in 2 days, expect more than 2 midnight  hospital stay. Consults called: Podiatry Admission status: MedSurg admission   Lequita Halt MD [facility] Pager [phone]  11/24/2021, 12:38 PM

## 2021-11-24 NOTE — H&P (View-Only) (Signed)
Reason for Consult: Osteomyelitis left foot. Referring Physician: Amber Beth Mcguire is an 81 y.o. female.  HPI: This is an 81 year old female with diabetes and neuropathy as well as peripheral vascular disease with chronic ulceration on her left foot.  Recent revascularization a few weeks ago.  Exposed bone and joint and felt that timing is appropriate to give her her best chance for healing for ray resection on the left foot.  Past Medical History:  Diagnosis Date   Anxiety    Colon cancer ([place])    Dementia ([place]) 2022   Diabetes ([place])    Hypertension    Peripheral vascular disease ([place])     Past Surgical History:  Procedure Laterality Date   AMPUTATION Right 01/14/2016   Procedure: AMPUTATION RAY;  Surgeon: Albertine Patricia, DPM;  Location: [facility];  Service: Podiatry;  Laterality: Right;   COLON SURGERY     LOWER EXTREMITY ANGIOGRAPHY Left 08/14/2017   Procedure: Lower Extremity Angiography;  Surgeon: Algernon Huxley, MD;  Location: [place] [facility];  Service: Cardiovascular;  Laterality: Left;   LOWER EXTREMITY ANGIOGRAPHY Left 11/06/2021   Procedure: LOWER EXTREMITY ANGIOGRAPHY;  Surgeon: Algernon Huxley, MD;  Location: [place] [facility];  Service: Cardiovascular;  Laterality: Left;   PERIPHERAL VASCULAR CATHETERIZATION N/A 01/05/2016   Procedure: Lower Extremity Angiography;  Surgeon: Algernon Huxley, MD;  Location: [place] [facility];  Service: Cardiovascular;  Laterality: N/A;   PERIPHERAL VASCULAR CATHETERIZATION  01/05/2016   Procedure: Lower Extremity Intervention;  Surgeon: Algernon Huxley, MD;  Location: [place] [facility];  Service: Cardiovascular;;   PERIPHERAL VASCULAR CATHETERIZATION Right 06/25/2016   Procedure: Lower Extremity Angiography;  Surgeon: Algernon Huxley, MD;  Location: [place] [facility];  Service: Cardiovascular;  Laterality: Right;   PERIPHERAL VASCULAR CATHETERIZATION  06/25/2016   Procedure: Lower Extremity Intervention;  Surgeon: Algernon Huxley, MD;  Location:  [place] [facility];  Service: Cardiovascular;;   TONSILLECTOMY      Family History  Problem Relation Age of Onset   Diabetes Other     Social History:  reports that she has never smoked. She has never used smokeless tobacco. She reports that she does not drink alcohol and does not use drugs.  Allergies:  Allergies  Allergen Reactions   Amoxicillin-Pot Clavulanate Rash    Medications: Scheduled:  aspirin EC  81 mg Oral Daily   atorvastatin  10 mg Oral QPM   enoxaparin (LOVENOX) injection  40 mg Subcutaneous Q24H   insulin aspart  0-9 Units Subcutaneous TID WC   [START ON 11/25/2021] lisinopril  20 mg Oral Daily   metFORMIN  500 mg Oral BID WC    Results for orders placed or performed during the hospital encounter of 11/24/21 (from the past 48 hour(s))  CBC with Differential     Status: Abnormal   Collection Time: 11/24/21 10:39 AM  Result Value Ref Range   WBC 11.2 (H) 4.0 - 10.5 K/uL   RBC 4.40 3.87 - 5.11 MIL/uL   Hemoglobin 12.3 12.0 - 15.0 g/dL   HCT 39.8 36.0 - 46.0 %   MCV 90.5 80.0 - 100.0 fL   MCH 28.0 26.0 - 34.0 pg   MCHC 30.9 30.0 - 36.0 g/dL   RDW 15.2 11.5 - 15.5 %   Platelets 353 150 - 400 K/uL   nRBC 0.0 0.0 - 0.2 %   Neutrophils Relative % 65 %   Neutro Abs 7.3 1.7 - 7.7 K/uL  Lymphocytes Relative 25 %   Lymphs Abs 2.8 0.7 - 4.0 K/uL   Monocytes Relative 5 %   Monocytes Absolute 0.5 0.1 - 1.0 K/uL   Eosinophils Relative 3 %   Eosinophils Absolute 0.3 0.0 - 0.5 K/uL   Basophils Relative 1 %   Basophils Absolute 0.1 0.0 - 0.1 K/uL   Immature Granulocytes 1 %   Abs Immature Granulocytes 0.07 0.00 - 0.07 K/uL    Comment: Performed at [facility], [address] [place], [place] [address]  Comprehensive metabolic panel     Status: Abnormal   Collection Time: 11/24/21 10:39 AM  Result Value Ref Range   Sodium 138 135 - 145 mmol/L   Potassium 4.7 3.5 - 5.1 mmol/L   Chloride 108 98 - 111 mmol/L   CO2 24 22 - 32 mmol/L   Glucose, Bld 156  (H) 70 - 99 mg/dL    Comment: Glucose reference range applies only to samples taken after fasting for at least 8 hours.   BUN 26 (H) 8 - 23 mg/dL   Creatinine, Ser 1.14 (H) 0.44 - 1.00 mg/dL   Calcium 9.1 8.9 - 10.3 mg/dL   Total Protein 6.7 6.5 - 8.1 g/dL   Albumin 3.2 (L) 3.5 - 5.0 g/dL   AST 17 15 - 41 U/L   ALT 12 0 - 44 U/L   Alkaline Phosphatase 130 (H) 38 - 126 U/L   Total Bilirubin 0.5 0.3 - 1.2 mg/dL   GFR, Estimated 48 (L) >60 mL/min    Comment: (NOTE) Calculated using the CKD-EPI Creatinine Equation (2021)    Anion gap 6 5 - 15    Comment: Performed at [facility], [address] [place], [place] [address]    No results found.  Review of Systems  Constitutional:  Negative for chills and fever.  HENT:  Negative for sinus pain and sore throat.   Respiratory:  Negative for cough and shortness of breath.   Cardiovascular:  Negative for chest pain and palpitations.  Gastrointestinal:  Negative for nausea and vomiting.  Genitourinary:  Negative for frequency and urgency.  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  Relates some increasing pain in her left foot.  Skin:        Chronic full-thickness ulceration on the left foot.  Relates some increased swelling with some drainage.  Neurological:        Some neuropathy associated with her diabetes.  Psychiatric/Behavioral:  Negative for behavioral problems. The patient is not nervous/anxious.   Blood pressure (!) 145/46, pulse (!) 59, temperature 98.2 F (36.8 C), temperature source Oral, resp. rate 18, SpO2 100 %. Physical Exam Cardiovascular:     Comments: DP and PT pulses could not clearly be palpated on the left foot. Musculoskeletal:     Comments: Previous right first ray resection.  Adequate range of motion of the pedal joints.  Some hallux valgus deformity on the left foot.  Skin:    Comments: The skin is warm dry and somewhat atrophic.  Some edema noted in the left  lower extremity.  Full-thickness ulceration medial left first metatarsal head with exposed bone and joint.  Neurological:     Comments: Loss of protective threshold with a monofilament wire.    Assessment/Plan: Assessment: 1.  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 2.  Full-thickness ulceration with exposed bone. 3.  Diabetes with associated neuropathy. 4.  Peripheral  vascular disease.  Plan: As previously discussed with the patient and her daughter she will require first ray resection on the left as previously performed on the right.  It is felt that with her recent revascularization that this would be her best chance for healing at this point.  Plan for now is for admission and most likely surgery on Sunday to allow for Plavix to get out of her system some.  Will follow daily until surgery.  Durward Fortes 11/24/2021, 1:14 PM

## 2021-11-24 NOTE — ED Triage Notes (Signed)
Pt was sent from Dr. Caryl Comes office for admission due to needing amputation of the left great toe. Pt is a/ox4

## 2021-11-24 NOTE — Consult Note (Signed)
PHARMACY -  BRIEF ANTIBIOTIC NOTE   Pharmacy has received consult(s) for cellulitis from an ED provider.  The patient's profile has been reviewed for ht/wt/allergies/indication/available labs.    One time order(s) placed for cefepime and vancomycin  Further antibiotics/pharmacy consults should be ordered by admitting physician if indicated.                       Thank you, Oswald Hillock 11/24/2021  12:16 PM

## 2021-11-24 NOTE — Progress Notes (Signed)
Nurse reported right arm rash when infusing Vancomycin. I went to see the patient in about 30-40 min after the Vanc infusion stops, there is macular rash, non-itchy, nonpainful rash on the right arm distal to the infusion site no swelling nontender.  D/W ID attending Dr. Delaine Lame, D/W about the right arm rash.  At this point, unclear whether rash is vancomycin allergy.  Infection disease reviewed patient wound, given infection is chronic and no symptoms or signs of sepsis, recommend to de-escalate to Unasyn.  Discussed with patient daughter at bedside, patient has no allergy to penicillin or ampicillin but only to Augmentin with rash on face and neck but no swelling no breathing compromise.  Infectious disease attending recommend Unasyn to cover for enterococcus and bacterial, then discussed with daughter, who agreed with Unasyn. Will monitor allergy reaction. D/W nurse.

## 2021-11-24 NOTE — Consult Note (Signed)
Reason for Consult: Osteomyelitis left foot. Referring Physician: Pascha Fogal is an 81 y.o. female.  HPI: This is an 81 year old female with diabetes and neuropathy as well as peripheral vascular disease with chronic ulceration on her left foot.  Recent revascularization a few weeks ago.  Exposed bone and joint and felt that timing is appropriate to give her her best chance for healing for ray resection on the left foot.  Past Medical History:  Diagnosis Date   Anxiety    Colon cancer ([place])    Dementia ([place]) 2022   Diabetes ([place])    Hypertension    Peripheral vascular disease ([place])     Past Surgical History:  Procedure Laterality Date   AMPUTATION Right 01/14/2016   Procedure: AMPUTATION RAY;  Surgeon: Albertine Patricia, DPM;  Location: [facility];  Service: Podiatry;  Laterality: Right;   COLON SURGERY     LOWER EXTREMITY ANGIOGRAPHY Left 08/14/2017   Procedure: Lower Extremity Angiography;  Surgeon: Algernon Huxley, MD;  Location: [place] [facility];  Service: Cardiovascular;  Laterality: Left;   LOWER EXTREMITY ANGIOGRAPHY Left 11/06/2021   Procedure: LOWER EXTREMITY ANGIOGRAPHY;  Surgeon: Algernon Huxley, MD;  Location: [place] [facility];  Service: Cardiovascular;  Laterality: Left;   PERIPHERAL VASCULAR CATHETERIZATION N/A 01/05/2016   Procedure: Lower Extremity Angiography;  Surgeon: Algernon Huxley, MD;  Location: [place] [facility];  Service: Cardiovascular;  Laterality: N/A;   PERIPHERAL VASCULAR CATHETERIZATION  01/05/2016   Procedure: Lower Extremity Intervention;  Surgeon: Algernon Huxley, MD;  Location: [place] [facility];  Service: Cardiovascular;;   PERIPHERAL VASCULAR CATHETERIZATION Right 06/25/2016   Procedure: Lower Extremity Angiography;  Surgeon: Algernon Huxley, MD;  Location: [place] [facility];  Service: Cardiovascular;  Laterality: Right;   PERIPHERAL VASCULAR CATHETERIZATION  06/25/2016   Procedure: Lower Extremity Intervention;  Surgeon: Algernon Huxley, MD;  Location:  [place] [facility];  Service: Cardiovascular;;   TONSILLECTOMY      Family History  Problem Relation Age of Onset   Diabetes Other     Social History:  reports that she has never smoked. She has never used smokeless tobacco. She reports that she does not drink alcohol and does not use drugs.  Allergies:  Allergies  Allergen Reactions   Amoxicillin-Pot Clavulanate Rash    Medications: Scheduled:  aspirin EC  81 mg Oral Daily   atorvastatin  10 mg Oral QPM   enoxaparin (LOVENOX) injection  40 mg Subcutaneous Q24H   insulin aspart  0-9 Units Subcutaneous TID WC   [START ON 11/25/2021] lisinopril  20 mg Oral Daily   metFORMIN  500 mg Oral BID WC    Results for orders placed or performed during the hospital encounter of 11/24/21 (from the past 48 hour(s))  CBC with Differential     Status: Abnormal   Collection Time: 11/24/21 10:39 AM  Result Value Ref Range   WBC 11.2 (H) 4.0 - 10.5 K/uL   RBC 4.40 3.87 - 5.11 MIL/uL   Hemoglobin 12.3 12.0 - 15.0 g/dL   HCT 39.8 36.0 - 46.0 %   MCV 90.5 80.0 - 100.0 fL   MCH 28.0 26.0 - 34.0 pg   MCHC 30.9 30.0 - 36.0 g/dL   RDW 15.2 11.5 - 15.5 %   Platelets 353 150 - 400 K/uL   nRBC 0.0 0.0 - 0.2 %   Neutrophils Relative % 65 %   Neutro Abs 7.3 1.7 - 7.7 K/uL  Lymphocytes Relative 25 %   Lymphs Abs 2.8 0.7 - 4.0 K/uL   Monocytes Relative 5 %   Monocytes Absolute 0.5 0.1 - 1.0 K/uL   Eosinophils Relative 3 %   Eosinophils Absolute 0.3 0.0 - 0.5 K/uL   Basophils Relative 1 %   Basophils Absolute 0.1 0.0 - 0.1 K/uL   Immature Granulocytes 1 %   Abs Immature Granulocytes 0.07 0.00 - 0.07 K/uL    Comment: Performed at [facility], [address] [place], [place] [address]  Comprehensive metabolic panel     Status: Abnormal   Collection Time: 11/24/21 10:39 AM  Result Value Ref Range   Sodium 138 135 - 145 mmol/L   Potassium 4.7 3.5 - 5.1 mmol/L   Chloride 108 98 - 111 mmol/L   CO2 24 22 - 32 mmol/L   Glucose, Bld 156  (H) 70 - 99 mg/dL    Comment: Glucose reference range applies only to samples taken after fasting for at least 8 hours.   BUN 26 (H) 8 - 23 mg/dL   Creatinine, Ser 1.14 (H) 0.44 - 1.00 mg/dL   Calcium 9.1 8.9 - 10.3 mg/dL   Total Protein 6.7 6.5 - 8.1 g/dL   Albumin 3.2 (L) 3.5 - 5.0 g/dL   AST 17 15 - 41 U/L   ALT 12 0 - 44 U/L   Alkaline Phosphatase 130 (H) 38 - 126 U/L   Total Bilirubin 0.5 0.3 - 1.2 mg/dL   GFR, Estimated 48 (L) >60 mL/min    Comment: (NOTE) Calculated using the CKD-EPI Creatinine Equation (2021)    Anion gap 6 5 - 15    Comment: Performed at [facility], [address] [place], [place] [address]    No results found.  Review of Systems  Constitutional:  Negative for chills and fever.  HENT:  Negative for sinus pain and sore throat.   Respiratory:  Negative for cough and shortness of breath.   Cardiovascular:  Negative for chest pain and palpitations.  Gastrointestinal:  Negative for nausea and vomiting.  Genitourinary:  Negative for frequency and urgency.  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  Relates some increasing pain in her left foot.  Skin:        Chronic full-thickness ulceration on the left foot.  Relates some increased swelling with some drainage.  Neurological:        Some neuropathy associated with her diabetes.  Psychiatric/Behavioral:  Negative for behavioral problems. The patient is not nervous/anxious.   Blood pressure (!) 145/46, pulse (!) 59, temperature 98.2 F (36.8 C), temperature source Oral, resp. rate 18, SpO2 100 %. Physical Exam Cardiovascular:     Comments: DP and PT pulses could not clearly be palpated on the left foot. Musculoskeletal:     Comments: Previous right first ray resection.  Adequate range of motion of the pedal joints.  Some hallux valgus deformity on the left foot.  Skin:    Comments: The skin is warm dry and somewhat atrophic.  Some edema noted in the left  lower extremity.  Full-thickness ulceration medial left first metatarsal head with exposed bone and joint.  Neurological:     Comments: Loss of protective threshold with a monofilament wire.    Assessment/Plan: Assessment: 1.  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 2.  Full-thickness ulceration with exposed bone. 3.  Diabetes with associated neuropathy. 4.  Peripheral  vascular disease.  Plan: As previously discussed with the patient and her daughter she will require first ray resection on the left as previously performed on the right.  It is felt that with her recent revascularization that this would be her best chance for healing at this point.  Plan for now is for admission and most likely surgery on Sunday to allow for Plavix to get out of her system some.  Will follow daily until surgery.  Durward Fortes 11/24/2021, 1:14 PM

## 2021-11-24 NOTE — ED Notes (Signed)
Pt's L foot warm; pt able to wiggle toes on L foot. Pt history of DM; pt had toe amputated from R foot years ago per family due to similar issues.

## 2021-11-24 NOTE — ED Provider Notes (Addendum)
[facility] Provider Note    Event Date/Time   First MD Initiated Contact with Patient 11/24/21 1145     (approximate)   History   Wound Check   HPI  Beth Mcguire is a 81 y.o. female with diabetes, peripheral vascular disease who comes in with concerns for wound.  Patient was seen by Dr. Cleda Mccreedy from [facility] podiatry.  Patient is coming in for a left foot amputation due to ulcer on the left toe that is exposing bone with x-ray concerning for osteomyelitis.  Patient's daughter is at bedside who reports that she is otherwise acting her normal self.  No falls.  She just had revascularization about left leg recently     Physical Exam   Triage Vital Signs: ED Triage Vitals [11/24/21 1014]  Enc Vitals Group     BP 118/73     Pulse Rate 71     Resp 18     Temp 98.2 F (36.8 C)     Temp Source Oral     SpO2 100 %     Weight      Height      Head Circumference      Peak Flow      Pain Score      Pain Loc      Pain Edu?      Excl. in [place]?     Most recent vital signs: Vitals:   11/24/21 1014 11/24/21 1147  BP: 118/73 (!) 148/44  Pulse: 71 65  Resp: 18 18  Temp: 98.2 F (36.8 C)   SpO2: 100% 97%     General: Awake, no distress.  CV:  Good peripheral perfusion.  Resp:  Normal effort.  Abd:  No distention.  Other:  Warm left foot with purulent ulcer noted at the joint of the big toe.   ED Results / Procedures / Treatments   Labs (all labs ordered are listed, but only abnormal results are displayed) Labs Reviewed  CBC WITH DIFFERENTIAL/PLATELET - Abnormal; Notable for the following components:      Result Value   WBC 11.2 (*)    All other components within normal limits  COMPREHENSIVE METABOLIC PANEL - Abnormal; Notable for the following components:   Glucose, Bld 156 (*)    BUN 26 (*)    Creatinine, Ser 1.14 (*)    Albumin 3.2 (*)    Alkaline Phosphatase 130 (*)    GFR, Estimated 48 (*)    All other components within normal limits      PROCEDURES:  Critical Care performed: No  Procedures   MEDICATIONS ORDERED IN ED: Medications - No data to display   IMPRESSION / MDM / [place] / ED COURSE  I reviewed the triage vital signs and the nursing notes.                              Differential diagnosis includes, but is not limited to, concerning for osteomyelitis versus cellulitis versus abscess.  Patient is not septic does not meet sepsis criteria.  No evidence of acute arterial occlusion discussed the case with Dr. Cleda Mccreedy who recommends admission and with holding the Plavix so that he can do surgery on Sunday I started her on antibiotics to cover for osteomyelitis.  Given her allergy we will do vancFlagyl cefepime.  CBC slightly elevated white count but does not meet admission criteria CMP reassuring  I reviewed the  podiatry note from yesterday by Dr. Cleda Mccreedy  I did discuss with hospital team for admission  FINAL CLINICAL IMPRESSION(S) / ED DIAGNOSES   Final diagnoses:  Osteomyelitis of left foot, unspecified type (Beth Mcguire)     Rx / DC Orders   ED Discharge Orders     None        Note:  This document was prepared using Dragon voice recognition software and may include unintentional dictation errors.   Vanessa [place], MD 11/24/21 1200    Vanessa , MD 11/24/21 1201

## 2021-11-24 NOTE — ED Notes (Signed)
FULL RAINBOW OBTAINED IN TRIAGE. BLUE AND GRAY TOP DRAWN.

## 2021-11-25 DIAGNOSIS — I10 Essential (primary) hypertension: Secondary | ICD-10-CM

## 2021-11-25 DIAGNOSIS — U071 COVID-19: Secondary | ICD-10-CM | POA: Diagnosis not present

## 2021-11-25 DIAGNOSIS — M86672 Other chronic osteomyelitis, left ankle and foot: Secondary | ICD-10-CM | POA: Diagnosis not present

## 2021-11-25 DIAGNOSIS — Z794 Long term (current) use of insulin: Secondary | ICD-10-CM

## 2021-11-25 DIAGNOSIS — M869 Osteomyelitis, unspecified: Secondary | ICD-10-CM

## 2021-11-25 DIAGNOSIS — E1142 Type 2 diabetes mellitus with diabetic polyneuropathy: Secondary | ICD-10-CM

## 2021-11-25 DIAGNOSIS — I739 Peripheral vascular disease, unspecified: Secondary | ICD-10-CM

## 2021-11-25 LAB — CBC
HCT: 33 % — ABNORMAL LOW (ref 36.0–46.0)
Hemoglobin: 10.4 g/dL — ABNORMAL LOW (ref 12.0–15.0)
MCH: 27.9 pg (ref 26.0–34.0)
MCHC: 31.5 g/dL (ref 30.0–36.0)
MCV: 88.5 fL (ref 80.0–100.0)
Platelets: 289 10*3/uL (ref 150–400)
RBC: 3.73 MIL/uL — ABNORMAL LOW (ref 3.87–5.11)
RDW: 15.4 % (ref 11.5–15.5)
WBC: 10.1 10*3/uL (ref 4.0–10.5)
nRBC: 0 % (ref 0.0–0.2)

## 2021-11-25 LAB — BASIC METABOLIC PANEL
Anion gap: 6 (ref 5–15)
BUN: 22 mg/dL (ref 8–23)
CO2: 22 mmol/L (ref 22–32)
Calcium: 8.5 mg/dL — ABNORMAL LOW (ref 8.9–10.3)
Chloride: 110 mmol/L (ref 98–111)
Creatinine, Ser: 1.08 mg/dL — ABNORMAL HIGH (ref 0.44–1.00)
GFR, Estimated: 51 mL/min — ABNORMAL LOW (ref >60)
Glucose, Bld: 110 mg/dL — ABNORMAL HIGH (ref 70–99)
Potassium: 4.9 mmol/L (ref 3.5–5.1)
Sodium: 138 mmol/L (ref 135–145)

## 2021-11-25 LAB — GLUCOSE, CAPILLARY
Glucose-Capillary: 99 mg/dL (ref 70–99)
Glucose-Capillary: 95 mg/dL (ref 70–99)
Glucose-Capillary: 188 mg/dL — ABNORMAL HIGH (ref 70–99)
Glucose-Capillary: 282 mg/dL — ABNORMAL HIGH (ref 70–99)
Glucose-Capillary: 194 mg/dL — ABNORMAL HIGH (ref 70–99)

## 2021-11-25 MED ORDER — DIPHENHYDRAMINE HCL 25 MG PO CAPS
25.0000 mg | ORAL_CAPSULE | Freq: Four times a day (QID) | ORAL | Status: DC | PRN
  Administered 2021-11-28 – 2021-11-29 (×2): 25 mg via ORAL
  Filled 2021-11-25 (×2): qty 1

## 2021-11-25 MED ORDER — ASCORBIC ACID 500 MG PO TABS
500.0000 mg | ORAL_TABLET | Freq: Two times a day (BID) | ORAL | Status: DC
  Administered 2021-11-25 – 2021-11-29 (×9): 500 mg via ORAL
  Filled 2021-11-25 (×9): qty 1

## 2021-11-25 MED ORDER — ZINC SULFATE 220 (50 ZN) MG PO CAPS
220.0000 mg | ORAL_CAPSULE | Freq: Every day | ORAL | Status: DC
  Administered 2021-11-25 – 2021-11-29 (×5): 220 mg via ORAL
  Filled 2021-11-25 (×5): qty 1

## 2021-11-25 MED ORDER — DIPHENHYDRAMINE HCL 50 MG/ML IJ SOLN
25.0000 mg | Freq: Once | INTRAMUSCULAR | Status: AC
  Administered 2021-11-25: 25 mg via INTRAVENOUS
  Filled 2021-11-25: qty 1

## 2021-11-25 NOTE — Progress Notes (Signed)
Subjective/Chief Complaint: Patient seen.  No complaints.   Objective: Vital signs in last 24 hours: Temp:  [97.7 F (36.5 C)-99 F (37.2 C)] 98.1 F (36.7 C) (02/18 0803) Pulse Rate:  [59-68] 68 (02/18 0803) Resp:  [16-20] 17 (02/18 0803) BP: (123-179)/(32-64) 129/48 (02/18 0803) SpO2:  [96 %-100 %] 100 % (02/18 0803) Last BM Date : 11/23/21  Intake/Output from previous day: No intake/output data recorded. Intake/Output this shift: No intake/output data recorded.  Full-thickness wound with exposed bone and joint.   Lab Results:  Recent Labs    11/24/21 1039 11/25/21 0503  WBC 11.2* 10.1  HGB 12.3 10.4*  HCT 39.8 33.0*  PLT 353 289   BMET Recent Labs    11/24/21 1039 11/25/21 0503  NA 138 138  K 4.7 4.9  CL 108 110  CO2 24 22  GLUCOSE 156* 110*  BUN 26* 22  CREATININE 1.14* 1.08*  CALCIUM 9.1 8.5*   PT/INR No results for input(s): LABPROT, INR in the last 72 hours. ABG No results for input(s): PHART, HCO3 in the last 72 hours.  Invalid input(s): PCO2, PO2  Studies/Results: US Venous Img Lower Bilateral (DVT)  Result Date: 11/24/2021 CLINICAL DATA:  Bilateral lower extremity edema. EXAM: BILATERAL LOWER EXTREMITY VENOUS DOPPLER ULTRASOUND TECHNIQUE: Gray-scale sonography with graded compression, as well as color Doppler and duplex ultrasound were performed to evaluate the lower extremity deep venous systems from the level of the common femoral vein and including the common femoral, femoral, profunda femoral, popliteal and calf veins including the posterior tibial, peroneal and gastrocnemius veins when visible. The superficial great saphenous vein was also interrogated. Spectral Doppler was utilized to evaluate flow at rest and with distal augmentation maneuvers in the common femoral, femoral and popliteal veins. COMPARISON:  None. FINDINGS: RIGHT LOWER EXTREMITY Common Femoral Vein: No evidence of thrombus. Normal compressibility, respiratory phasicity  and response to augmentation. Saphenofemoral Junction: No evidence of thrombus. Normal compressibility and flow on color Doppler imaging. Profunda Femoral Vein: No evidence of thrombus. Normal compressibility and flow on color Doppler imaging. Femoral Vein: No evidence of thrombus. Normal compressibility, respiratory phasicity and response to augmentation. Popliteal Vein: No evidence of thrombus. Normal compressibility, respiratory phasicity and response to augmentation. Calf Veins: No evidence of thrombus. Normal compressibility and flow on color Doppler imaging. Superficial Great Saphenous Vein: No evidence of thrombus. Normal compressibility. Venous Reflux:  None. Other Findings: No evidence of superficial thrombophlebitis or abnormal fluid collection. LEFT LOWER EXTREMITY Common Femoral Vein: No evidence of thrombus. Normal compressibility, respiratory phasicity and response to augmentation. Saphenofemoral Junction: No evidence of thrombus. Normal compressibility and flow on color Doppler imaging. Profunda Femoral Vein: No evidence of thrombus. Normal compressibility and flow on color Doppler imaging. Femoral Vein: No evidence of thrombus. Normal compressibility, respiratory phasicity and response to augmentation. Popliteal Vein: No evidence of thrombus. Normal compressibility, respiratory phasicity and response to augmentation. Calf Veins: No evidence of thrombus. Normal compressibility and flow on color Doppler imaging. Superficial Great Saphenous Vein: No evidence of thrombus. Normal compressibility. Venous Reflux:  None. Other Findings: No evidence of superficial thrombophlebitis or abnormal fluid collection. IMPRESSION: No evidence of deep venous thrombosis in either lower extremity. Electronically Signed   By: Aletta Edouard M.D.   On: 11/24/2021 16:25    Anti-infectives: Anti-infectives (From admission, onward)    Start     Dose/Rate Route Frequency Ordered Stop   11/24/21 2200  ceFEPIme (MAXIPIME)  2 g in sodium chloride 0.9 % 100 mL IVPB  Status:  Discontinued        2 g 200 mL/hr over 30 Minutes Intravenous Every 12 hours 11/24/21 1617 11/24/21 1842   11/24/21 2200  linezolid (ZYVOX) IVPB 600 mg  Status:  Discontinued        600 mg 300 mL/hr over 60 Minutes Intravenous Every 12 hours 11/24/21 1617 11/24/21 1626   11/24/21 2200  Ampicillin-Sulbactam (UNASYN) 3 g in sodium chloride 0.9 % 100 mL IVPB        3 g 200 mL/hr over 30 Minutes Intravenous Every 6 hours 11/24/21 1847     11/24/21 1200  ceFEPIme (MAXIPIME) 2 g in sodium chloride 0.9 % 100 mL IVPB        2 g 200 mL/hr over 30 Minutes Intravenous  Once 11/24/21 1157 11/24/21 1258   11/24/21 1200  metroNIDAZOLE (FLAGYL) IVPB 500 mg        500 mg 100 mL/hr over 60 Minutes Intravenous  Once 11/24/21 1157 11/24/21 1326   11/24/21 1200  vancomycin (VANCOCIN) IVPB 1000 mg/200 mL premix        1,000 mg 200 mL/hr over 60 Minutes Intravenous  Once 11/24/21 1157 11/24/21 1500       Assessment/Plan: s/p Procedure(s): AMPUTATION RAY (Left) Assessment: Full-thickness ulceration with osteomyelitis left foot.  Plan: Betadine gauze applied to the wound.  Discussed with the patient and her daughter procedure for tomorrow morning with amputation of the left great toe with partial ray resection.  Discussed possible risks and complications of the procedure and anesthesia including continued infection or inability to heal due to infection, diabetes, or vascular status.  No guarantees can be given as to the outcome.  Questions invited and answered.  Patient will be n.p.o. after midnight.  Consent form for amputation left hallux with partial ray resection.  Plan is for surgery tomorrow morning  LOS: 1 day    Durward Fortes 11/25/2021

## 2021-11-25 NOTE — Plan of Care (Signed)
°  Problem: Clinical Measurements: Goal: Ability to maintain clinical measurements within normal limits will improve Outcome: Progressing   Problem: Clinical Measurements: Goal: Will remain free from infection Outcome: Progressing   Problem: Clinical Measurements: Goal: Diagnostic test results will improve Outcome: Progressing   Problem: Clinical Measurements: Goal: Cardiovascular complication will be avoided Outcome: Progressing   Problem: Coping: Goal: Level of anxiety will decrease Outcome: Progressing   Problem: Elimination: Goal: Will not experience complications related to bowel motility Outcome: Progressing   Problem: Safety: Goal: Ability to remain free from injury will improve Outcome: Progressing   Problem: Skin Integrity: Goal: Risk for impaired skin integrity will decrease Outcome: Progressing

## 2021-11-25 NOTE — Progress Notes (Signed)
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  Beth Mcguire noted to have had recent revascularization by vascular surgery a few weeks ago.  Beth Mcguire noted to have completed 10-day course of doxycycline in the outpatient setting and started on Flagyl 2 days prior to admission.  Beth Mcguire evaluated by podiatry on day of admission noted to have exposed bone and joint and admitted for osteomyelitis with exposed first metatarsal phalangeal joint of the left foot and admitted for amputation of the left great toe with partial ray resection to be done by podiatry on 11/26/2021.  Plavix held.  Beth Mcguire placed empirically on IV antibiotics of Unasyn.  Podiatry following.  Beth Mcguire noted to have had a prior COVID infection on 10/16/2021 and treated in the outpatient setting.   Assessment & Plan:   Principal Problem:   Osteomyelitis ([place]) Active Problems:   Foot osteomyelitis, right ([place])   Hypertension   COVID-19 virus infection: RECENT PSOTIVE TEST 10/16/2021   PVD (peripheral vascular disease) ([place])  #1 osteomyelitis of the left foot/left foot infected decubitus ulcer -Beth Mcguire admitted after failing outpatient treatment. -Beth Mcguire noted to have finished a full course of doxycycline and started on Flagyl 2 days prior to admission. -Beth Mcguire admitted and evaluated for podiatry and Beth Mcguire scheduled for amputation of the left great toe and partial ray resection on 11/26/2021. -Podiatry not recommending any further imaging as clear finding noted on plain films of the  foot for osteomyelitis. -Plavix on hold. -Beth Mcguire initially was to be placed on IV vancomycin, cefepime and Flagyl however while Beth Mcguire receiving IV vancomycin there was some concern for rash in the upper extremity. -Admitting physician discussed case with ID attending, Dr.Ravishanker who had recommended IV Unasyn to cover for Enterococcus species. -Continue IV Unasyn. -Beth Mcguire seen by podiatry and Beth Mcguire scheduled for left great toe and partial ray resection on 11/26/2021. -Per orthopedics.  2.  Peripheral vascular disease with recent left SFA stenting -Continue aspirin, statin. -Plavix on hold in anticipation of surgical procedure tomorrow. -Podiatry to advise when Plavix may be resumed.  3.  Insulin-dependent diabetes mellitus with episodic hypoglycemia -Beth Mcguire to be n.p.o. after midnight in anticipation of procedure tomorrow. -Discontinue metformin. -Maintain on sliding scale insulin. -Postprocedure if no further procedures planned and Beth Mcguire eating greater than 50% of meals consistently then we will resume home regimen of 70/30.  4.  Hypertension -Lisinopril.  5.  COVID-19 virus infection (10/16/2021) -Beth Mcguire noted to have had COVID-19 virus infection 10/16/2021 treated in the outpatient setting and currently asymptomatic and not hypoxic. -No further treatment needed at this time. -No isolation needed. -Placed on vitamin C and zinc.   DVT prophylaxis: Lovenox Code Status: Full Family Communication: Updated Beth Mcguire, son, daughter at bedside. Disposition:   Status is: Inpatient Remains inpatient appropriate because: Severity of illness           Consultants:  Orthopedics: Dr.Cline 11/24/2021  Procedures:  Lower extremity Dopplers 11/24/2021  Antimicrobials:  IV Unasyn 11/24/2021>>>>   Subjective: Sitting up in bed eating her lunch.  Son and daughter at bedside.  Beth Mcguire denies any chest pain.  No shortness of breath.  No abdominal pain  Objective: Vitals:    11/25/21 0409 11/25/21 0803 11/25/21 1135 11/25/21 1524  BP: (!) 146/39 (!) 129/48 (!) 136/50 (!) 128/52  Pulse: 62 68 61 (!) 58  Resp: 20 17 17 16   Temp: 98.3 F (36.8 C) 98.1 F (36.7 C) 97.8 F (36.6 C) 98.1 F (36.7 C)  TempSrc:      SpO2: 99% 100% 99% 98%   No intake or output data in the 24 hours ending 11/25/21 1639 There were no vitals filed for this visit.  Examination:  General exam: Appears calm and comfortable  Respiratory system: Clear to auscultation. Respiratory effort normal. Cardiovascular system: S1 & S2 heard, RRR. No JVD, murmurs, rubs, gallops or clicks. No pedal edema. Gastrointestinal system: Abdomen is nondistended, soft and nontender. No organomegaly or masses felt. Normal bowel sounds heard. Central nervous system: Alert and oriented. No focal neurological deficits. Extremities: Left foot bandaged.  Symmetric 5 x 5 power. Skin: No rashes, lesions or ulcers Psychiatry: Judgement and insight appear normal. Mood & affect appropriate.      Data Reviewed: I have personally reviewed following labs and imaging studies  CBC: Recent Labs  Lab 11/24/21 1039 11/25/21 0503  WBC 11.2* 10.1  NEUTROABS 7.3  --   HGB 12.3 10.4*  HCT 39.8 33.0*  MCV 90.5 88.5  PLT 353 426    Basic Metabolic Panel: Recent Labs  Lab 11/24/21 1039 11/25/21 0503  NA 138 138  K 4.7 4.9  CL 108 110  CO2 24 22  GLUCOSE 156* 110*  BUN 26* 22  CREATININE 1.14* 1.08*  CALCIUM 9.1 8.5*    GFR: Estimated Creatinine Clearance: 36.3 mL/min (A) (by C-G formula based on SCr of 1.08 mg/dL (H)).  Liver Function Tests: Recent Labs  Lab 11/24/21 1039  AST 17  ALT 12  ALKPHOS 130*  BILITOT 0.5  PROT 6.7  ALBUMIN 3.2*    CBG: Recent Labs  Lab 11/24/21 1633 11/24/21 2110 11/25/21 0805 11/25/21 0850 11/25/21 1137  GLUCAP 179* 171* 99 95 188*     Recent Results (from the past 240 hour(s))  Resp Panel by RT-PCR (Flu A&B, Covid) Nasopharyngeal Swab     Status:  Abnormal   Collection Time: 11/24/21 11:57 AM   Specimen: Nasopharyngeal Swab; Nasopharyngeal(NP) swabs in vial transport medium  Result Value Ref Range Status   SARS Coronavirus 2 by RT PCR POSITIVE (A) NEGATIVE Final    Comment: (NOTE) SARS-CoV-2 target nucleic acids are DETECTED.  The SARS-CoV-2 RNA is generally detectable in upper respiratory specimens during the acute phase of infection. Positive results are indicative of the presence of the identified virus, but do not rule out bacterial infection or co-infection with other pathogens not detected by the test. Clinical correlation with Beth Mcguire history and other diagnostic information is necessary to determine Beth Mcguire infection status. The expected result is Negative.  Fact Sheet for Patients: EntrepreneurPulse.com.au  Fact Sheet for Healthcare Providers: IncredibleEmployment.be  This test is not yet approved or cleared by the [place] FDA and  has been authorized for detection and/or diagnosis of SARS-CoV-2 by FDA under an Emergency Use Authorization (EUA).  This EUA will remain in effect (meaning this test can be used) for the duration of  the COVID-19 declaration under Section 564(b)(1) of the A ct, 21 U.S.C. section 360bbb-3(b)(1), unless the authorization is terminated or revoked sooner.     Influenza A by PCR NEGATIVE NEGATIVE Final   Influenza B by PCR NEGATIVE NEGATIVE Final  Comment: (NOTE) The Xpert Xpress SARS-CoV-2/FLU/RSV plus assay is intended as an aid in the diagnosis of influenza from Nasopharyngeal swab specimens and should not be used as a sole basis for treatment. Nasal washings and aspirates are unacceptable for Xpert Xpress SARS-CoV-2/FLU/RSV testing.  Fact Sheet for Patients: EntrepreneurPulse.com.au  Fact Sheet for Healthcare Providers: IncredibleEmployment.be  This test is not yet approved or cleared by the [place] FDA and has been authorized for detection and/or diagnosis of SARS-CoV-2 by FDA under an Emergency Use Authorization (EUA). This EUA will remain in effect (meaning this test can be used) for the duration of the COVID-19 declaration under Section 564(b)(1) of the Act, 21 U.S.C. section 360bbb-3(b)(1), unless the authorization is terminated or revoked.  Performed at [facility], [address], [place], [place] [address]   Surgical pcr screen     Status: None   Collection Time: 11/24/21  4:59 PM   Specimen: Nasal Mucosa; Nasal Swab  Result Value Ref Range Status   MRSA, PCR NEGATIVE NEGATIVE Final   Staphylococcus aureus NEGATIVE NEGATIVE Final    Comment: (NOTE) The Xpert SA Assay (FDA approved for NASAL specimens in patients 15 years of age and older), is one component of a comprehensive surveillance program. It is not intended to diagnose infection nor to guide or monitor treatment. Performed at [facility], [address] [place], [place] [address]          Radiology Studies: US Venous Img Lower Bilateral (DVT)  Result Date: 11/24/2021 CLINICAL DATA:  Bilateral lower extremity edema. EXAM: BILATERAL LOWER EXTREMITY VENOUS DOPPLER ULTRASOUND TECHNIQUE: Gray-scale sonography with graded compression, as well as color Doppler and duplex ultrasound were performed to evaluate the lower extremity deep venous systems from the level of the common femoral vein and including the common femoral, femoral, profunda femoral, popliteal and calf veins including the posterior tibial, peroneal and gastrocnemius veins when visible. The superficial great saphenous vein was also interrogated. Spectral Doppler was utilized to evaluate flow at rest and with distal augmentation maneuvers in the common femoral, femoral and popliteal veins. COMPARISON:  None. FINDINGS: RIGHT LOWER EXTREMITY Common Femoral Vein: No evidence of thrombus. Normal compressibility, respiratory  phasicity and response to augmentation. Saphenofemoral Junction: No evidence of thrombus. Normal compressibility and flow on color Doppler imaging. Profunda Femoral Vein: No evidence of thrombus. Normal compressibility and flow on color Doppler imaging. Femoral Vein: No evidence of thrombus. Normal compressibility, respiratory phasicity and response to augmentation. Popliteal Vein: No evidence of thrombus. Normal compressibility, respiratory phasicity and response to augmentation. Calf Veins: No evidence of thrombus. Normal compressibility and flow on color Doppler imaging. Superficial Great Saphenous Vein: No evidence of thrombus. Normal compressibility. Venous Reflux:  None. Other Findings: No evidence of superficial thrombophlebitis or abnormal fluid collection. LEFT LOWER EXTREMITY Common Femoral Vein: No evidence of thrombus. Normal compressibility, respiratory phasicity and response to augmentation. Saphenofemoral Junction: No evidence of thrombus. Normal compressibility and flow on color Doppler imaging. Profunda Femoral Vein: No evidence of thrombus. Normal compressibility and flow on color Doppler imaging. Femoral Vein: No evidence of thrombus. Normal compressibility, respiratory phasicity and response to augmentation. Popliteal Vein: No evidence of thrombus. Normal compressibility, respiratory phasicity and response to augmentation. Calf Veins: No evidence of thrombus. Normal compressibility and flow on color Doppler imaging. Superficial Great Saphenous Vein: No evidence of thrombus. Normal compressibility. Venous Reflux:  None. Other Findings: No evidence of superficial thrombophlebitis or abnormal fluid collection. IMPRESSION: No evidence of deep venous thrombosis in  either lower extremity. Electronically Signed   By: Aletta Edouard M.D.   On: 11/24/2021 16:25        Scheduled Meds:  vitamin C  500 mg Oral BID   aspirin EC  81 mg Oral Daily   atorvastatin  10 mg Oral QPM   enoxaparin (LOVENOX)  injection  40 mg Subcutaneous Q24H   insulin aspart  0-9 Units Subcutaneous TID WC   lisinopril  20 mg Oral Daily   zinc sulfate  220 mg Oral Daily   Continuous Infusions:  sodium chloride 10 mL/hr at 11/25/21 0518   ampicillin-sulbactam (UNASYN) IV 3 g (11/25/21 0900)     LOS: 1 day    Time spent: 35 minutes    Irine Seal, MD [facility]   To contact the attending provider between 7A-7P or the covering provider during after hours 7P-7A, please log into the web site [email or web address] and access using universal [place] password for that web site. If you do not have the password, please call the hospital operator.  11/25/2021, 4:39 PM

## 2021-11-25 NOTE — Anesthesia Preprocedure Evaluation (Addendum)
Anesthesia Evaluation  Patient identified by MRN, date of birth, ID band Patient awake    Reviewed: Allergy & Precautions, NPO status , Patient's Chart, lab work & pertinent test results  Airway Mallampati: III  TM Distance: >3 FB Neck ROM: full    Dental  (+) Chipped, Poor Dentition   Pulmonary pneumonia (Covid + 11/15/2021. Original infection was 10/16/2021), resolved,    Pulmonary exam normal        Cardiovascular Exercise Tolerance: Poor hypertension, Pt. on medications + Peripheral Vascular Disease  Normal cardiovascular exam  EKG 11/14/21: Sinus or ectopic atrial rhythm Short PR interval Left ventricular hypertrophy  3 weeks ago, patient underwent peripheral catheterization, and stent placed in in the left SFA.   Neuro/Psych PSYCHIATRIC DISORDERS Anxiety Dementia negative neurological ROS     GI/Hepatic negative GI ROS, Neg liver ROS,   Endo/Other  diabetes, Type 2, Insulin Dependent  Renal/GU CRFRenal disease  negative genitourinary   Musculoskeletal   Abdominal (+) + obese,   Peds  Hematology  (+) Blood dyscrasia, anemia ,   Anesthesia Other Findings Osteomyelitis left foot  Past Medical History: No date: Anxiety No date: Colon cancer ([place]) 2022: Dementia ([place]) No date: Diabetes ([place]) No date: Hypertension No date: Peripheral vascular disease [facility])  Past Surgical History: 01/14/2016: AMPUTATION; Right     Comment:  Procedure: AMPUTATION RAY;  Surgeon: Albertine Patricia,               DPM;  Location: [facility] ORS;  Service: Podiatry;                Laterality: Right; No date: COLON SURGERY 08/14/2017: LOWER EXTREMITY ANGIOGRAPHY; Left     Comment:  Procedure: Lower Extremity Angiography;  Surgeon: Algernon Huxley, MD;  Location: [place] [facility];  Service:               Cardiovascular;  Laterality: Left; 11/06/2021: LOWER EXTREMITY ANGIOGRAPHY; Left     Comment:  Procedure: LOWER EXTREMITY  ANGIOGRAPHY;  Surgeon: Algernon Huxley, MD;  Location: [place] CV LAB;  Service:               Cardiovascular;  Laterality: Left; 01/05/2016: PERIPHERAL VASCULAR CATHETERIZATION; N/A     Comment:  Procedure: Lower Extremity Angiography;  Surgeon: Algernon Huxley, MD;  Location: [place] [facility];  Service:               Cardiovascular;  Laterality: N/A; 01/05/2016: PERIPHERAL VASCULAR CATHETERIZATION     Comment:  Procedure: Lower Extremity Intervention;  Surgeon: Algernon Huxley, MD;  Location: [place] [facility];  Service:               Cardiovascular;; 06/25/2016: PERIPHERAL VASCULAR CATHETERIZATION; Right     Comment:  Procedure: Lower Extremity Angiography;  Surgeon: Algernon Huxley, MD;  Location: [place] [facility];  Service:               Cardiovascular;  Laterality: Right; 06/25/2016: PERIPHERAL VASCULAR CATHETERIZATION     Comment:  Procedure: Lower Extremity Intervention;  Surgeon: Corene Cornea  Bunnie Domino, MD;  Location: [place] CV [facility];  Service:               Cardiovascular;; No date: TONSILLECTOMY     Reproductive/Obstetrics negative OB ROS                            Anesthesia Physical Anesthesia Plan  ASA: 3  Anesthesia Plan: General   Post-op Pain Management:    Induction: Intravenous  PONV Risk Score and Plan: 2 and TIVA and Treatment may vary due to age or medical condition  Airway Management Planned: Natural Airway and Nasal Cannula  Additional Equipment:   Intra-op Plan:   Post-operative Plan:   Informed Consent: I have reviewed the patients History and Physical, chart, labs and discussed the procedure including the risks, benefits and alternatives for the proposed anesthesia with the patient or authorized representative who has indicated his/her understanding and acceptance.     Dental advisory given  Plan Discussed with: Anesthesiologist, CRNA and  Surgeon  Anesthesia Plan Comments:       Anesthesia Quick Evaluation

## 2021-11-26 ENCOUNTER — Encounter
Admission: EM | Disposition: A | Discharge status: Home / Self Care | Payer: Self-pay | Source: Home / Self Care | Attending: Internal Medicine

## 2021-11-26 ENCOUNTER — Inpatient Hospital Stay: Payer: Medicare HMO | Admitting: Anesthesiology

## 2021-11-26 ENCOUNTER — Other Ambulatory Visit: Payer: Self-pay

## 2021-11-26 DIAGNOSIS — M869 Osteomyelitis, unspecified: Secondary | ICD-10-CM | POA: Diagnosis not present

## 2021-11-26 DIAGNOSIS — U071 COVID-19: Secondary | ICD-10-CM | POA: Diagnosis not present

## 2021-11-26 DIAGNOSIS — I10 Essential (primary) hypertension: Secondary | ICD-10-CM | POA: Diagnosis not present

## 2021-11-26 DIAGNOSIS — R338 Other retention of urine: Secondary | ICD-10-CM | POA: Clinically undetermined

## 2021-11-26 DIAGNOSIS — R21 Rash and other nonspecific skin eruption: Secondary | ICD-10-CM | POA: Diagnosis not present

## 2021-11-26 DIAGNOSIS — M86672 Other chronic osteomyelitis, left ankle and foot: Secondary | ICD-10-CM | POA: Diagnosis not present

## 2021-11-26 HISTORY — PX: AMPUTATION: SHX166

## 2021-11-26 LAB — BASIC METABOLIC PANEL
Anion gap: 8 (ref 5–15)
BUN: 20 mg/dL (ref 8–23)
CO2: 22 mmol/L (ref 22–32)
Calcium: 8.4 mg/dL — ABNORMAL LOW (ref 8.9–10.3)
Chloride: 108 mmol/L (ref 98–111)
Creatinine, Ser: 1.14 mg/dL — ABNORMAL HIGH (ref 0.44–1.00)
GFR, Estimated: 48 mL/min — ABNORMAL LOW (ref >60)
Glucose, Bld: 149 mg/dL — ABNORMAL HIGH (ref 70–99)
Potassium: 4.7 mmol/L (ref 3.5–5.1)
Sodium: 138 mmol/L (ref 135–145)

## 2021-11-26 LAB — CBC WITH DIFFERENTIAL/PLATELET
Abs Immature Granulocytes: 0.14 10*3/uL — ABNORMAL HIGH (ref 0.00–0.07)
Basophils Absolute: 0.1 10*3/uL (ref 0.0–0.1)
Basophils Relative: 0 %
Eosinophils Absolute: 0.5 10*3/uL (ref 0.0–0.5)
Eosinophils Relative: 3 %
HCT: 40.6 % (ref 36.0–46.0)
Hemoglobin: 12.7 g/dL (ref 12.0–15.0)
Immature Granulocytes: 1 %
Lymphocytes Relative: 11 %
Lymphs Abs: 1.9 10*3/uL (ref 0.7–4.0)
MCH: 27.9 pg (ref 26.0–34.0)
MCHC: 31.3 g/dL (ref 30.0–36.0)
MCV: 89.2 fL (ref 80.0–100.0)
Monocytes Absolute: 0.9 10*3/uL (ref 0.1–1.0)
Monocytes Relative: 5 %
Neutro Abs: 13.3 10*3/uL — ABNORMAL HIGH (ref 1.7–7.7)
Neutrophils Relative %: 80 %
Platelets: 307 10*3/uL (ref 150–400)
RBC: 4.55 MIL/uL (ref 3.87–5.11)
RDW: 15.3 % (ref 11.5–15.5)
WBC: 16.8 10*3/uL — ABNORMAL HIGH (ref 4.0–10.5)
nRBC: 0 % (ref 0.0–0.2)

## 2021-11-26 LAB — GLUCOSE, CAPILLARY
Glucose-Capillary: 142 mg/dL — ABNORMAL HIGH (ref 70–99)
Glucose-Capillary: 120 mg/dL — ABNORMAL HIGH (ref 70–99)
Glucose-Capillary: 191 mg/dL — ABNORMAL HIGH (ref 70–99)
Glucose-Capillary: 247 mg/dL — ABNORMAL HIGH (ref 70–99)

## 2021-11-26 LAB — CK: Total CK: 12 U/L — ABNORMAL LOW (ref 38–234)

## 2021-11-26 SURGERY — AMPUTATION, FOOT, RAY
Anesthesia: Monitor Anesthesia Care | Site: Foot | Laterality: Left

## 2021-11-26 MED ORDER — INSULIN ASPART 100 UNIT/ML IJ SOLN
0.0000 [IU] | INTRAMUSCULAR | Status: DC
  Administered 2021-11-26: 3 [IU] via SUBCUTANEOUS
  Administered 2021-11-26: 2 [IU] via SUBCUTANEOUS
  Administered 2021-11-27: 1 [IU] via SUBCUTANEOUS
  Administered 2021-11-27 (×2): 3 [IU] via SUBCUTANEOUS
  Administered 2021-11-27: 2 [IU] via SUBCUTANEOUS
  Administered 2021-11-27 (×3): 3 [IU] via SUBCUTANEOUS
  Administered 2021-11-28: 7 [IU] via SUBCUTANEOUS
  Administered 2021-11-28: 3 [IU] via SUBCUTANEOUS
  Administered 2021-11-28: 2 [IU] via SUBCUTANEOUS
  Administered 2021-11-29: 1 [IU] via SUBCUTANEOUS
  Administered 2021-11-29: 5 [IU] via SUBCUTANEOUS
  Administered 2021-11-29: 3 [IU] via SUBCUTANEOUS
  Filled 2021-11-26 (×14): qty 1

## 2021-11-26 MED ORDER — SODIUM CHLORIDE 0.9 % IV SOLN: INTRAVENOUS | Status: AC

## 2021-11-26 MED ORDER — SODIUM CHLORIDE 0.9 % IV SOLN
2.0000 g | Freq: Two times a day (BID) | INTRAVENOUS | Status: DC
  Administered 2021-11-26 – 2021-11-27 (×2): 2 g via INTRAVENOUS
  Filled 2021-11-26 (×4): qty 2

## 2021-11-26 MED ORDER — PROMETHAZINE HCL 25 MG/ML IJ SOLN: 6.2500 mg | INTRAMUSCULAR | Status: DC | PRN

## 2021-11-26 MED ORDER — HYDROCODONE-ACETAMINOPHEN 5-325 MG PO TABS
1.0000 | ORAL_TABLET | ORAL | Status: DC | PRN
  Administered 2021-11-28: 1 via ORAL
  Filled 2021-11-26: qty 1

## 2021-11-26 MED ORDER — FAMOTIDINE 20 MG PO TABS
20.0000 mg | ORAL_TABLET | Freq: Every day | ORAL | Status: DC
  Administered 2021-11-26 – 2021-11-29 (×4): 20 mg via ORAL
  Filled 2021-11-26 (×4): qty 1

## 2021-11-26 MED ORDER — PHENYLEPHRINE 40 MCG/ML (10ML) SYRINGE FOR IV PUSH (FOR BLOOD PRESSURE SUPPORT)
PREFILLED_SYRINGE | INTRAVENOUS | Status: AC
  Filled 2021-11-26: qty 10

## 2021-11-26 MED ORDER — OXYCODONE HCL 5 MG PO TABS: 5.0000 mg | ORAL_TABLET | Freq: Once | ORAL | Status: DC | PRN

## 2021-11-26 MED ORDER — ACETAMINOPHEN 10 MG/ML IV SOLN: 1000.0000 mg | Freq: Once | INTRAVENOUS | Status: DC | PRN

## 2021-11-26 MED ORDER — METRONIDAZOLE 500 MG PO TABS
500.0000 mg | ORAL_TABLET | Freq: Two times a day (BID) | ORAL | Status: DC
Start: 2021-11-26 — End: 2021-11-29
  Administered 2021-11-26 – 2021-11-29 (×7): 500 mg via ORAL
  Filled 2021-11-26 (×7): qty 1

## 2021-11-26 MED ORDER — EPHEDRINE 5 MG/ML INJ
INTRAVENOUS | Status: AC
  Filled 2021-11-26: qty 5

## 2021-11-26 MED ORDER — EPHEDRINE SULFATE (PRESSORS) 50 MG/ML IJ SOLN
INTRAMUSCULAR | Status: DC | PRN
  Administered 2021-11-26 (×2): 5 mg via INTRAVENOUS

## 2021-11-26 MED ORDER — ACETAMINOPHEN 325 MG PO TABS: 650.0000 mg | ORAL_TABLET | ORAL | Status: DC | PRN

## 2021-11-26 MED ORDER — PHENYLEPHRINE HCL (PRESSORS) 10 MG/ML IV SOLN
INTRAVENOUS | Status: DC | PRN
  Administered 2021-11-26 (×3): 40 ug via INTRAVENOUS

## 2021-11-26 MED ORDER — BUPIVACAINE HCL 0.5 % IJ SOLN
INTRAMUSCULAR | Status: DC | PRN
  Administered 2021-11-26: 10 mL

## 2021-11-26 MED ORDER — PROPOFOL 500 MG/50ML IV EMUL
INTRAVENOUS | Status: DC | PRN
  Administered 2021-11-26: 50 ug/kg/min via INTRAVENOUS

## 2021-11-26 MED ORDER — DIPHENHYDRAMINE HCL 50 MG/ML IJ SOLN
25.0000 mg | Freq: Two times a day (BID) | INTRAMUSCULAR | Status: DC
  Administered 2021-11-26 (×2): 25 mg via INTRAVENOUS
  Filled 2021-11-26 (×2): qty 1

## 2021-11-26 MED ORDER — METHYLPREDNISOLONE SODIUM SUCC 40 MG IJ SOLR
40.0000 mg | Freq: Once | INTRAMUSCULAR | Status: AC
  Administered 2021-11-26: 40 mg via INTRAVENOUS
  Filled 2021-11-26: qty 1

## 2021-11-26 MED ORDER — OXYCODONE HCL 5 MG/5ML PO SOLN: 5.0000 mg | Freq: Once | ORAL | Status: DC | PRN

## 2021-11-26 MED ORDER — FENTANYL CITRATE (PF) 100 MCG/2ML IJ SOLN: 25.0000 ug | INTRAMUSCULAR | Status: DC | PRN

## 2021-11-26 MED ORDER — SODIUM CHLORIDE 0.9 % IV SOLN
6.0000 mg/kg | Freq: Every day | INTRAVENOUS | Status: DC
  Administered 2021-11-26 – 2021-11-28 (×3): 400 mg via INTRAVENOUS
  Filled 2021-11-26 (×4): qty 8

## 2021-11-26 MED ORDER — PROPOFOL 10 MG/ML IV BOLUS
INTRAVENOUS | Status: AC
  Filled 2021-11-26: qty 20

## 2021-11-26 MED ORDER — MORPHINE SULFATE (PF) 2 MG/ML IV SOLN: 2.0000 mg | INTRAVENOUS | Status: DC | PRN

## 2021-11-26 MED ORDER — LACTATED RINGERS IV SOLN: INTRAVENOUS | Status: DC | PRN

## 2021-11-26 SURGICAL SUPPLY — 52 items
BLADE MED AGGRESSIVE (BLADE) ×3 IMPLANT
BLADE OSC/SAGITTAL MD 5.5X18 (BLADE) ×2 IMPLANT
BLADE SURG 15 STRL LF DISP TIS (BLADE) ×2 IMPLANT
BLADE SURG 15 STRL SS (BLADE) ×6
BLADE SURG MINI STRL (BLADE) ×2 IMPLANT
BNDG CONFORM 2 STRL LF (GAUZE/BANDAGES/DRESSINGS) ×2 IMPLANT
BNDG CONFORM 3 STRL LF (GAUZE/BANDAGES/DRESSINGS) ×2 IMPLANT
BNDG ELASTIC 4X5.8 VLCR STR LF (GAUZE/BANDAGES/DRESSINGS) ×2 IMPLANT
BNDG ESMARK 4X12 TAN STRL LF (GAUZE/BANDAGES/DRESSINGS) ×2 IMPLANT
BNDG GAUZE ELAST 4 BULKY (GAUZE/BANDAGES/DRESSINGS) ×2 IMPLANT
CNTNR SPEC 2.5X3XGRAD LEK (MISCELLANEOUS) ×1
CONT SPEC 4OZ STER OR WHT (MISCELLANEOUS) ×1
CONT SPEC 4OZ STRL OR WHT (MISCELLANEOUS) ×1
CONTAINER SPEC 2.5X3XGRAD LEK (MISCELLANEOUS) IMPLANT
CUFF TOURN SGL QUICK 12 (TOURNIQUET CUFF) ×2 IMPLANT
CUFF TOURN SGL QUICK 18X4 (TOURNIQUET CUFF) ×2 IMPLANT
DRAPE FLUOR MINI C-ARM 54X84 (DRAPES) ×2 IMPLANT
DURAPREP 26ML APPLICATOR (WOUND CARE) ×2 IMPLANT
ELECT REM PT RETURN 9FT ADLT (ELECTROSURGICAL) ×2
ELECTRODE REM PT RTRN 9FT ADLT (ELECTROSURGICAL) ×1 IMPLANT
GAUZE SPONGE 4X4 12PLY STRL (GAUZE/BANDAGES/DRESSINGS) ×2 IMPLANT
GAUZE XEROFORM 1X8 LF (GAUZE/BANDAGES/DRESSINGS) ×2 IMPLANT
GLOVE SURG ENC MOIS LTX SZ7.5 (GLOVE) ×2 IMPLANT
GLOVE SURG UNDER LTX SZ8 (GLOVE) ×2 IMPLANT
GOWN STRL REUS W/ TWL LRG LVL3 (GOWN DISPOSABLE) ×2 IMPLANT
GOWN STRL REUS W/TWL LRG LVL3 (GOWN DISPOSABLE) ×4
HANDPIECE VERSAJET DEBRIDEMENT (MISCELLANEOUS) ×2 IMPLANT
IV NS IRRIG 3000ML ARTHROMATIC (IV SOLUTION) ×2 IMPLANT
KIT TURNOVER KIT A (KITS) ×2 IMPLANT
LABEL OR SOLS (LABEL) ×2 IMPLANT
MANIFOLD NEPTUNE II (INSTRUMENTS) ×2 IMPLANT
NDL FILTER BLUNT 18X1 1/2 (NEEDLE) ×1 IMPLANT
NDL HYPO 25X1 1.5 SAFETY (NEEDLE) ×2 IMPLANT
NEEDLE FILTER BLUNT 18X 1/2SAF (NEEDLE) ×1
NEEDLE FILTER BLUNT 18X1 1/2 (NEEDLE) ×1 IMPLANT
NEEDLE HYPO 25X1 1.5 SAFETY (NEEDLE) ×4 IMPLANT
NS IRRIG 500ML POUR BTL (IV SOLUTION) ×2 IMPLANT
PACK EXTREMITY ARMC (MISCELLANEOUS) ×2 IMPLANT
PAD ABD DERMACEA PRESS 5X9 (GAUZE/BANDAGES/DRESSINGS) ×4 IMPLANT
SOL PREP PVP 2OZ (MISCELLANEOUS) ×2
SOLUTION PREP PVP 2OZ (MISCELLANEOUS) ×1 IMPLANT
STOCKINETTE BIAS CUT 4 980044 (GAUZE/BANDAGES/DRESSINGS) ×2 IMPLANT
STOCKINETTE STRL 6IN 960660 (GAUZE/BANDAGES/DRESSINGS) ×2 IMPLANT
STRIP CLOSURE SKIN 1/4X4 (GAUZE/BANDAGES/DRESSINGS) ×2 IMPLANT
SUT ETHILON 3-0 FS-10 30 BLK (SUTURE) ×2
SUT ETHILON 4-0 (SUTURE)
SUT ETHILON 4-0 FS2 18XMFL BLK (SUTURE)
SUTURE EHLN 3-0 FS-10 30 BLK (SUTURE) ×1 IMPLANT
SUTURE ETHLN 4-0 FS2 18XMF BLK (SUTURE) IMPLANT
SWAB CULTURE AMIES ANAERIB BLU (MISCELLANEOUS) ×2 IMPLANT
SYR 10ML LL (SYRINGE) ×2 IMPLANT
WATER STERILE IRR 500ML POUR (IV SOLUTION) ×2 IMPLANT

## 2021-11-26 NOTE — Interval H&P Note (Signed)
History and Physical Interval Note:  11/26/2021 8:28 AM  Beth Mcguire  has presented today for surgery, with the diagnosis of Osteomyelitis left foot.  The various methods of treatment have been discussed with the patient and family. After consideration of risks, benefits and other options for treatment, the patient has consented to  Procedure(s): AMPUTATION RAY (Left) as a surgical intervention.  The patient's history has been reviewed, patient examined, no change in status, stable for surgery.  I have reviewed the patient's chart and labs.  Questions were answered to the patient's satisfaction.     Durward Fortes

## 2021-11-26 NOTE — Consult Note (Signed)
Pharmacy Antibiotic Note  Beth Mcguire is a 81 y.o. 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 ID is on case. Patient was initially being treated with Unasyn but developed a rash while on therapy. Pharmacy has been consulted for daptomycin and cefepime dosing. Patient is also ordered metronidazole.  Podiatry following and performed left hallux amputation with partial ray resection 2/19. Cultures sent  Plan:  Cefepime 2 g IV q12h  Daptomycin 400 mg (6 mg/kg) q24h --Continue to monitor renal function and adjust as indicated --Baseline CK pending, continue to follow CK weekly, on atorvastatin   Temp (24hrs), Avg:98.1 F (36.7 C), Min:97.5 F (36.4 C), Max:98.7 F (37.1 C)  Recent Labs  Lab 11/24/21 1039 11/25/21 0503 11/26/21 0600  WBC 11.2* 10.1 16.8*  CREATININE 1.14* 1.08* 1.14*     Estimated Creatinine Clearance: 34.4 mL/min (A) (by C-G formula based on SCr of 1.14 mg/dL (H)).    Allergies  Allergen Reactions   Amoxicillin-Pot Clavulanate Rash    Antimicrobials this admission: Cefepime 2/17 x 1, 2/19 >> Metronidazole 2/17 x 1, 2/19 >> Vancomycin 2/17 x 1 Unasyn 2/17 >> 2/19 Daptomycin 2/19 >>  Dose adjustments this admission: N/A  Microbiology results: 2/17 SARS-CoV-2 PCR: (+) 2/17 MRSA PCR: (-) 2/19 Bone culture: pending  Thank you for allowing pharmacy to be a part of this patients care.  Benita Gutter 11/26/2021 2:32 PM

## 2021-11-26 NOTE — Plan of Care (Signed)
°  Problem: Education: Goal: Knowledge of General Education information will improve Description: Including pain rating scale, medication(s)/side effects and non-pharmacologic comfort measures Outcome: Progressing   Problem: Clinical Measurements: Goal: Ability to maintain clinical measurements within normal limits will improve Outcome: Progressing   Problem: Clinical Measurements: Goal: Diagnostic test results will improve Outcome: Progressing   Problem: Clinical Measurements: Goal: Cardiovascular complication will be avoided Outcome: Progressing

## 2021-11-26 NOTE — Op Note (Signed)
Date of operation: 11/26/2021.  Surgeon: Durward Fortes D.P.M.  Preoperative diagnosis: Full-thickness ulceration with osteomyelitis left first metatarsal.  Postoperative diagnosis: Same.  Procedure: Amputation left hallux with partial ray resection.  Anesthesia: Local MAC.  Hemostasis: None.  Estimated blood loss: 20 to 30 cc.  Cultures: Bone cultures left first metatarsal head.  Pathology: Left first ray.  Complications: None apparent.  Operative indications: This is an 81 year old female with chronic history of ulceration on her left foot.  Recently underwent revascularization but ulceration has progressed down to the level of exposed bone and joint and decision is made for amputation with first ray resection to allow for her best chance for healing.  Operative procedure: Patient was taken to the operating room and placed on the table in the supine position.  Following satisfactory sedation the left forefoot was anesthetized with 10 cc of 0.5% Marcaine plain around the first ray.  The left foot was then prepped and draped in the usual sterile fashion.  Attention was directed to the left great toe joint area where an incision was made from proximal to distal excising the medial ulceration and then extending dorsal and plantar over the base of the hallux.  Incision was deepened down to the level of the bone and dissection carried back proximally revealing the first ray.  Incision was then extended proximally from the ulceration.  First metatarsal was incised using a sagittal saw and the entire first ray and ulceration was removed in toto.  The wound was then flushed with copious amounts of sterile saline and closed using 3-0 nylon simple interrupted sutures.  Culture taken of the bone and the toe was passed off for pathology.  Xeroform 4 x 4's ABD and Kerlix applied to the left foot followed by second Kerlix and Ace wrap.  Patient was awakened and transported to the PACU having tolerated the  anesthesia and procedures well with vital signs stable and in good condition.

## 2021-11-26 NOTE — Progress Notes (Addendum)
Non-itchy, nonpainful red rash on patients right arm up to shoulder blade as well as partial left arm reported and assessed by this nurse and charge nurse Marlowe Sax RN. NP Sharion Settler notified and new orders were received and administered.

## 2021-11-26 NOTE — Anesthesia Postprocedure Evaluation (Signed)
Anesthesia Post Note  Patient: Beth Mcguire  Procedure(s) Performed: AMPUTATION FIRST RAY (Left: Foot)  Patient location during evaluation: PACU Anesthesia Type: General Level of consciousness: awake and alert Pain management: pain level controlled Vital Signs Assessment: post-procedure vital signs reviewed and stable Respiratory status: spontaneous breathing, nonlabored ventilation and respiratory function stable Cardiovascular status: blood pressure returned to baseline and stable Postop Assessment: no apparent nausea or vomiting Anesthetic complications: no   No notable events documented.   Last Vitals:  Vitals:   11/26/21 1637 11/26/21 2005  BP: 139/82 (!) 144/50  Pulse: 74 64  Resp: 18 18  Temp: 36.7 C 37 C  SpO2: 97% 97%    Last Pain:  Vitals:   11/26/21 2005  TempSrc: Oral  PainSc:                  [place] Ouch

## 2021-11-26 NOTE — Transfer of Care (Signed)
Immediate Anesthesia Transfer of Care Note  Patient: Beth Mcguire  Procedure(s) Performed: AMPUTATION FIRST RAY (Left: Foot)  Patient Location: PACU  Anesthesia Type:MAC  Level of Consciousness: awake  Airway & Oxygen Therapy: Patient Spontanous Breathing  Post-op Assessment: Report given to RN  Post vital signs: stable  Last Vitals:  Vitals Value Taken Time  BP 114/57 11/26/21 0947  Temp 98.2   Pulse 88 11/26/21 0949  Resp 22 11/26/21 0949  SpO2 97 % 11/26/21 0949  Vitals shown include unvalidated device data.  Last Pain:  Vitals:   11/25/21 2113  TempSrc:   PainSc: 0-No pain         Complications: No notable events documented.

## 2021-11-26 NOTE — Consult Note (Signed)
Pharmacy Antibiotic Note  Beth Mcguire is a 81 y.o. 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 ID is on case. Patient was initially being treated with Unasyn but developed a rash while on therapy. Pharmacy has been consulted for daptomycin dosing.  Podiatry following and performed left hallux amputation with partial ray resection 2/19. Cultures sent  Plan:  Daptomycin 400 mg (6 mg/kg) q24h --Continue to monitor renal function and adjust as indicated --Baseline CK pending, continue to follow CK weekly, on atorvastatin   Temp (24hrs), Avg:98.1 F (36.7 C), Min:97.5 F (36.4 C), Max:98.7 F (37.1 C)  Recent Labs  Lab 11/24/21 1039 11/25/21 0503 11/26/21 0600  WBC 11.2* 10.1 16.8*  CREATININE 1.14* 1.08* 1.14*    Estimated Creatinine Clearance: 34.4 mL/min (A) (by C-G formula based on SCr of 1.14 mg/dL (H)).    Allergies  Allergen Reactions   Amoxicillin-Pot Clavulanate Rash    Antimicrobials this admission: Cefepime 2/17 x 1 Metronidazole 2/17 x 1 Vancomycin 2/17 x 1 Unasyn 2/17 >> 2/19 Daptomycin 2/19 >>  Dose adjustments this admission: N/A  Microbiology results: 2/17 SARS-CoV-2 PCR: (+) 2/17 MRSA PCR: (-) 2/19 Bone culture: pending  Thank you for allowing pharmacy to be a part of this patients care.  Benita Gutter 11/26/2021 10:40 AM

## 2021-11-26 NOTE — Progress Notes (Signed)
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  Patient noted to have had recent revascularization by vascular surgery a few weeks ago.  Patient noted to have completed 10-day course of doxycycline in the outpatient setting and started on Flagyl 2 days prior to admission.  Patient evaluated by podiatry on day of admission noted to have exposed bone and joint and admitted for osteomyelitis with exposed first metatarsal phalangeal joint of the left foot and admitted for amputation of the left great toe with partial ray resection to be done by podiatry on 11/26/2021.  Plavix held.  Patient placed empirically on IV antibiotics of Unasyn.  Podiatry following.  Patient noted to have had a prior COVID infection on 10/16/2021 and treated in the outpatient setting.   Assessment & Plan:   Principal Problem:   Osteomyelitis ([place]) Active Problems:   Foot osteomyelitis, right ([place])   Hypertension   COVID-19 virus infection: RECENT PSOTIVE TEST 10/16/2021   PVD (peripheral vascular disease) ([place])  #1 osteomyelitis of the left foot/left foot infected decubitus ulcer -Patient admitted after failing outpatient treatment. -Patient noted to have finished a full course of doxycycline and started on Flagyl 2 days prior to admission. -Patient admitted and evaluated for podiatry and patient scheduled for amputation of the left great toe and partial ray resection on 11/26/2021. -Podiatry not recommending any further imaging as clear finding noted on plain films of the  foot for osteomyelitis. -Plavix on hold. -Patient initially was to be placed on IV vancomycin, cefepime and Flagyl however while patient receiving IV vancomycin there was some concern for rash in the upper extremity. -Admitting physician discussed case with ID attending, Dr.Ravishanker who had recommended IV Unasyn to cover for Enterococcus species. -Patient noted to have developed rash overnight while on IV Unasyn and as such this has been discontinued. -Patient status post amputation left hallux with partial ray resection by podiatry, Dr. Cleda Mccreedy 11/26/2020 -Curbsided ID on-call who recommended initiation of IV daptomycin, IV cefepime, oral Flagyl pending bone cultures postoperatively and until patient able to be assessed formally by ID in the morning. - -Continue IV Unasyn. -Patient seen by podiatry and patient scheduled for left great toe and partial ray resection on 11/26/2021. -Per orthopedics.  2.  Peripheral vascular disease with recent left SFA stenting -Continue aspirin, statin. -Plavix was held for surgical procedure today.  -Per podiatry Plavix may be resumed tonight or tomorrow.   -Will resume Plavix tomorrow.   3.  Insulin-dependent diabetes mellitus with episodic hypoglycemia -Patient to be n.p.o. after midnight in anticipation of procedure tomorrow. -Discontinue metformin. -Maintain on sliding scale insulin. -Postprocedure if no further procedures planned and patient eating greater than 50% of meals consistently then we will resume home regimen of 70/30.  4.  Hypertension -Controlled on lisinopril.  5.  COVID-19 virus infection (10/16/2021) -Patient noted to have had COVID-19 virus infection 10/16/2021 treated in the outpatient setting and currently asymptomatic and not hypoxic. -No further treatment needed at this time. -No isolation needed. -Placed on vitamin C and zinc.  6.  Rash -Likely drug-induced rash. -Patient noted to have a  macular rash bilateral upper  extremities, upper torso, upper back in between IV antibiotics of Unasyn. -Patient noted to have a similar reaction on presentation after a dose of IV vancomycin. -Placed on Benadryl, Pepcid.  IV Solu-Medrol 40 mg p.o. x1. -Discontinue IV Unasyn. -Place on IV daptomycin, IV cefepime, oral Flagyl after discussion with Dr. Juleen China of ID until patient can be seen in formal consultation 11/27/2021.  7.  Urinary retention - I and o cath x1. -PVR. -If continued retention may need Foley catheter placement with outpatient follow-up with urology. -   DVT prophylaxis: Lovenox Code Status: Full Family Communication: Updated patient, daughter at bedside. Disposition:   Status is: Inpatient Remains inpatient appropriate because: Severity of illness           Consultants:  Podiatry: Dr.Cline 11/24/2021 ID consultation pending  Procedures:  Lower extremity Dopplers 11/24/2021 Amputation left hallux with partial ray resection (11/26/2021)  Antimicrobials:  IV Unasyn 11/24/2021>>>> 11/26/2021 IV daptomycin 11/26/2021>>> IV cefepime 11/26/2021>>>> Oral Flagyl 11/26/2021>>>.   Subjective: Sitting up in bed eating lunch.  No chest pain.  No shortness of breath.  Stated developed a diffuse rash on the right upper extremity and left upper extremity last night with IV antibiotics.   Objective: Vitals:   11/26/21 1000 11/26/21 1012 11/26/21 1015 11/26/21 1033  BP: (!) 108/51   (!) 123/50  Pulse: 90  83 86  Resp: 20  (!) 22   Temp:  98 F (36.7 C)  (!) 97.5 F (36.4 C)  TempSrc:      SpO2: 97%  99% 100%    Intake/Output Summary (Last 24 hours) at 11/26/2021 1207 Last data filed at 11/26/2021 0932 Gross per 24 hour  Intake 521.35 ml  Output 10 ml  Net 511.35 ml   There were no vitals filed for this visit.  Examination:  General exam: NAD. Respiratory system: CTA B.  No wheezes, no crackles, no rhonchi.  Normal respiratory effort.  Speaking in full sentences. Cardiovascular system:  Regular rate rhythm no murmurs rubs or gallops.  No JVD.  No lower extremity edema. Gastrointestinal system: Abdomen is nondistended, soft and nontender. No organomegaly or masses felt. Normal bowel sounds heard. Central nervous system: Alert and oriented. No focal neurological deficits. Extremities: Left foot in postop bandage.   Skin: Diffuse macular rash on bilateral upper extremities, upper torso, upper back which is somewhat blanchable.  Psychiatry: Judgement and insight appear normal. Mood & affect appropriate.      Data Reviewed: I have personally reviewed following labs and imaging studies  CBC: Recent Labs  Lab 11/24/21 1039 11/25/21 0503 11/26/21 0600  WBC 11.2* 10.1 16.8*  NEUTROABS 7.3  --  13.3*  HGB 12.3 10.4* 12.7  HCT 39.8 33.0* 40.6  MCV 90.5 88.5 89.2  PLT 353 289 307     Basic Metabolic Panel: Recent Labs  Lab 11/24/21 1039 11/25/21 0503 11/26/21 0600  NA 138 138 138  K 4.7 4.9 4.7  CL 108 110 108  CO2 24 22 22   GLUCOSE 156* 110* 149*  BUN 26* 22 20  CREATININE 1.14* 1.08* 1.14*  CALCIUM 9.1 8.5* 8.4*     GFR: Estimated Creatinine Clearance: 34.4 mL/min (A) (by C-G formula based on SCr of 1.14 mg/dL (H)).  Liver Function Tests: Recent Labs  Lab 11/24/21 1039  AST 17  ALT 12  ALKPHOS 130*  BILITOT 0.5  PROT 6.7  ALBUMIN 3.2*     CBG: Recent Labs  Lab 11/25/21 0850 11/25/21 1137 11/25/21  1653 11/25/21 2246 11/26/21 0954  GLUCAP 95 188* 282* 194* 142*      Recent Results (from the past 240 hour(s))  Resp Panel by RT-PCR (Flu A&B, Covid) Nasopharyngeal Swab     Status: Abnormal   Collection Time: 11/24/21 11:57 AM   Specimen: Nasopharyngeal Swab; Nasopharyngeal(NP) swabs in vial transport medium  Result Value Ref Range Status   SARS Coronavirus 2 by RT PCR POSITIVE (A) NEGATIVE Final    Comment: (NOTE) SARS-CoV-2 target nucleic acids are DETECTED.  The SARS-CoV-2 RNA is generally detectable in upper  respiratory specimens during the acute phase of infection. Positive results are indicative of the presence of the identified virus, but do not rule out bacterial infection or co-infection with other pathogens not detected by the test. Clinical correlation with patient history and other diagnostic information is necessary to determine patient infection status. The expected result is Negative.  Fact Sheet for Patients: EntrepreneurPulse.com.au  Fact Sheet for Healthcare Providers: IncredibleEmployment.be  This test is not yet approved or cleared by the [place] FDA and  has been authorized for detection and/or diagnosis of SARS-CoV-2 by FDA under an Emergency Use Authorization (EUA).  This EUA will remain in effect (meaning this test can be used) for the duration of  the COVID-19 declaration under Section 564(b)(1) of the A ct, 21 U.S.C. section 360bbb-3(b)(1), unless the authorization is terminated or revoked sooner.     Influenza A by PCR NEGATIVE NEGATIVE Final   Influenza B by PCR NEGATIVE NEGATIVE Final    Comment: (NOTE) The Xpert Xpress SARS-CoV-2/FLU/RSV plus assay is intended as an aid in the diagnosis of influenza from Nasopharyngeal swab specimens and should not be used as a sole basis for treatment. Nasal washings and aspirates are unacceptable for Xpert Xpress SARS-CoV-2/FLU/RSV testing.  Fact Sheet for Patients: EntrepreneurPulse.com.au  Fact Sheet for Healthcare Providers: IncredibleEmployment.be  This test is not yet approved or cleared by the [place] FDA and has been authorized for detection and/or diagnosis of SARS-CoV-2 by FDA under an Emergency Use Authorization (EUA). This EUA will remain in effect (meaning this test can be used) for the duration of the COVID-19 declaration under Section 564(b)(1) of the Act, 21 U.S.C. section 360bbb-3(b)(1), unless the authorization is  terminated or revoked.  Performed at [facility], [address] [place], [place] [address]   Surgical pcr screen     Status: None   Collection Time: 11/24/21  4:59 PM   Specimen: Nasal Mucosa; Nasal Swab  Result Value Ref Range Status   MRSA, PCR NEGATIVE NEGATIVE Final   Staphylococcus aureus NEGATIVE NEGATIVE Final    Comment: (NOTE) The Xpert SA Assay (FDA approved for NASAL specimens in patients 38 years of age and older), is one component of a comprehensive surveillance program. It is not intended to diagnose infection nor to guide or monitor treatment. Performed at [facility] [address], [place], [place] [address]           Radiology Studies: US Venous Img Lower Bilateral (DVT)  Result Date: 11/24/2021 CLINICAL DATA:  Bilateral lower extremity edema. EXAM: BILATERAL LOWER EXTREMITY VENOUS DOPPLER ULTRASOUND TECHNIQUE: Gray-scale sonography with graded compression, as well as color Doppler and duplex ultrasound were performed to evaluate the lower extremity deep venous systems from the level of the common femoral vein and including the common femoral, femoral, profunda femoral, popliteal and calf veins including the posterior tibial, peroneal and gastrocnemius veins when visible. The superficial great saphenous vein was also  interrogated. Spectral Doppler was utilized to evaluate flow at rest and with distal augmentation maneuvers in the common femoral, femoral and popliteal veins. COMPARISON:  None. FINDINGS: RIGHT LOWER EXTREMITY Common Femoral Vein: No evidence of thrombus. Normal compressibility, respiratory phasicity and response to augmentation. Saphenofemoral Junction: No evidence of thrombus. Normal compressibility and flow on color Doppler imaging. Profunda Femoral Vein: No evidence of thrombus. Normal compressibility and flow on color Doppler imaging. Femoral Vein: No evidence of thrombus. Normal compressibility, respiratory phasicity and  response to augmentation. Popliteal Vein: No evidence of thrombus. Normal compressibility, respiratory phasicity and response to augmentation. Calf Veins: No evidence of thrombus. Normal compressibility and flow on color Doppler imaging. Superficial Great Saphenous Vein: No evidence of thrombus. Normal compressibility. Venous Reflux:  None. Other Findings: No evidence of superficial thrombophlebitis or abnormal fluid collection. LEFT LOWER EXTREMITY Common Femoral Vein: No evidence of thrombus. Normal compressibility, respiratory phasicity and response to augmentation. Saphenofemoral Junction: No evidence of thrombus. Normal compressibility and flow on color Doppler imaging. Profunda Femoral Vein: No evidence of thrombus. Normal compressibility and flow on color Doppler imaging. Femoral Vein: No evidence of thrombus. Normal compressibility, respiratory phasicity and response to augmentation. Popliteal Vein: No evidence of thrombus. Normal compressibility, respiratory phasicity and response to augmentation. Calf Veins: No evidence of thrombus. Normal compressibility and flow on color Doppler imaging. Superficial Great Saphenous Vein: No evidence of thrombus. Normal compressibility. Venous Reflux:  None. Other Findings: No evidence of superficial thrombophlebitis or abnormal fluid collection. IMPRESSION: No evidence of deep venous thrombosis in either lower extremity. Electronically Signed   By: Aletta Edouard M.D.   On: 11/24/2021 16:25        Scheduled Meds:  vitamin C  500 mg Oral BID   aspirin EC  81 mg Oral Daily   atorvastatin  10 mg Oral QPM   diphenhydrAMINE  25 mg Intravenous BID   enoxaparin (LOVENOX) injection  40 mg Subcutaneous Q24H   famotidine  20 mg Oral BID   insulin aspart  0-9 Units Subcutaneous Q4H   lisinopril  20 mg Oral Daily   methylPREDNISolone (SOLU-MEDROL) injection  40 mg Intravenous Once   zinc sulfate  220 mg Oral Daily   Continuous Infusions:  sodium chloride 10 mL/hr  at 11/26/21 0413   sodium chloride     DAPTOmycin (CUBICIN)  IV       LOS: 2 days    Time spent: 35 minutes    Irine Seal, MD [facility]   To contact the attending provider between 7A-7P or the covering provider during after hours 7P-7A, please log into the web site [email or web address] and access using universal [place] password for that web site. If you do not have the password, please call the hospital operator.  11/26/2021, 12:07 PM

## 2021-11-27 ENCOUNTER — Encounter: Payer: Self-pay | Admitting: Podiatry

## 2021-11-27 DIAGNOSIS — R338 Other retention of urine: Secondary | ICD-10-CM

## 2021-11-27 DIAGNOSIS — E1169 Type 2 diabetes mellitus with other specified complication: Principal | ICD-10-CM

## 2021-11-27 DIAGNOSIS — M86672 Other chronic osteomyelitis, left ankle and foot: Secondary | ICD-10-CM | POA: Diagnosis not present

## 2021-11-27 DIAGNOSIS — I10 Essential (primary) hypertension: Secondary | ICD-10-CM | POA: Diagnosis not present

## 2021-11-27 DIAGNOSIS — E11628 Type 2 diabetes mellitus with other skin complications: Secondary | ICD-10-CM

## 2021-11-27 DIAGNOSIS — U071 COVID-19: Secondary | ICD-10-CM | POA: Diagnosis not present

## 2021-11-27 DIAGNOSIS — R21 Rash and other nonspecific skin eruption: Secondary | ICD-10-CM

## 2021-11-27 DIAGNOSIS — L089 Local infection of the skin and subcutaneous tissue, unspecified: Secondary | ICD-10-CM

## 2021-11-27 LAB — BASIC METABOLIC PANEL
Anion gap: 4 — ABNORMAL LOW (ref 5–15)
BUN: 24 mg/dL — ABNORMAL HIGH (ref 8–23)
CO2: 22 mmol/L (ref 22–32)
Calcium: 8.2 mg/dL — ABNORMAL LOW (ref 8.9–10.3)
Chloride: 111 mmol/L (ref 98–111)
Creatinine, Ser: 1.24 mg/dL — ABNORMAL HIGH (ref 0.44–1.00)
GFR, Estimated: 43 mL/min — ABNORMAL LOW (ref >60)
Glucose, Bld: 214 mg/dL — ABNORMAL HIGH (ref 70–99)
Potassium: 4.4 mmol/L (ref 3.5–5.1)
Sodium: 137 mmol/L (ref 135–145)

## 2021-11-27 LAB — GLUCOSE, CAPILLARY
Glucose-Capillary: 129 mg/dL — ABNORMAL HIGH (ref 70–99)
Glucose-Capillary: 148 mg/dL — ABNORMAL HIGH (ref 70–99)
Glucose-Capillary: 191 mg/dL — ABNORMAL HIGH (ref 70–99)
Glucose-Capillary: 204 mg/dL — ABNORMAL HIGH (ref 70–99)
Glucose-Capillary: 208 mg/dL — ABNORMAL HIGH (ref 70–99)
Glucose-Capillary: 218 mg/dL — ABNORMAL HIGH (ref 70–99)
Glucose-Capillary: 227 mg/dL — ABNORMAL HIGH (ref 70–99)
Glucose-Capillary: 248 mg/dL — ABNORMAL HIGH (ref 70–99)

## 2021-11-27 LAB — CBC WITH DIFFERENTIAL/PLATELET
Abs Immature Granulocytes: 0.14 10*3/uL — ABNORMAL HIGH (ref 0.00–0.07)
Basophils Absolute: 0 10*3/uL (ref 0.0–0.1)
Basophils Relative: 0 %
Eosinophils Absolute: 0 10*3/uL (ref 0.0–0.5)
Eosinophils Relative: 0 %
HCT: 32.7 % — ABNORMAL LOW (ref 36.0–46.0)
Hemoglobin: 10.2 g/dL — ABNORMAL LOW (ref 12.0–15.0)
Immature Granulocytes: 1 %
Lymphocytes Relative: 10 %
Lymphs Abs: 1.5 10*3/uL (ref 0.7–4.0)
MCH: 27.7 pg (ref 26.0–34.0)
MCHC: 31.2 g/dL (ref 30.0–36.0)
MCV: 88.9 fL (ref 80.0–100.0)
Monocytes Absolute: 0.5 10*3/uL (ref 0.1–1.0)
Monocytes Relative: 3 %
Neutro Abs: 13.6 10*3/uL — ABNORMAL HIGH (ref 1.7–7.7)
Neutrophils Relative %: 86 %
Platelets: 287 10*3/uL (ref 150–400)
RBC: 3.68 MIL/uL — ABNORMAL LOW (ref 3.87–5.11)
RDW: 15.2 % (ref 11.5–15.5)
WBC: 15.8 10*3/uL — ABNORMAL HIGH (ref 4.0–10.5)
nRBC: 0 % (ref 0.0–0.2)

## 2021-11-27 MED ORDER — SODIUM CHLORIDE 0.9 % IV SOLN: INTRAVENOUS | Status: DC

## 2021-11-27 MED ORDER — CALAMINE EX LOTN
1.0000 "application " | TOPICAL_LOTION | Freq: Two times a day (BID) | CUTANEOUS | Status: DC
  Administered 2021-11-27 – 2021-11-29 (×4): 1 via TOPICAL
  Filled 2021-11-27: qty 177

## 2021-11-27 MED ORDER — LISINOPRIL 20 MG PO TABS
20.0000 mg | ORAL_TABLET | Freq: Every day | ORAL | Status: DC
  Administered 2021-11-28 – 2021-11-29 (×2): 20 mg via ORAL
  Filled 2021-11-27 (×2): qty 1

## 2021-11-27 MED ORDER — CLOPIDOGREL BISULFATE 75 MG PO TABS
75.0000 mg | ORAL_TABLET | Freq: Every day | ORAL | Status: DC
  Administered 2021-11-27 – 2021-11-29 (×3): 75 mg via ORAL
  Filled 2021-11-27 (×3): qty 1

## 2021-11-27 MED ORDER — DIPHENHYDRAMINE HCL 25 MG PO CAPS
25.0000 mg | ORAL_CAPSULE | Freq: Two times a day (BID) | ORAL | Status: DC
  Administered 2021-11-27 – 2021-11-28 (×3): 25 mg via ORAL
  Filled 2021-11-27 (×3): qty 1

## 2021-11-27 NOTE — Progress Notes (Signed)
1 Day Post-Op   Subjective/Chief Complaint: Patient seen.  No complaints of any pain.   Objective: Vital signs in last 24 hours: Temp:  [98.1 F (36.7 C)-98.6 F (37 C)] 98.3 F (36.8 C) (02/20 0426) Pulse Rate:  [64-74] 68 (02/20 0426) Resp:  [18-20] 20 (02/20 0426) BP: (116-144)/(50-82) 116/79 (02/20 0426) SpO2:  [97 %-99 %] 98 % (02/20 0426) Last BM Date : 11/25/21  Intake/Output from previous day: 02/19 0701 - 02/20 0700 In: 396.2 [I.V.:238.1; IV Piggyback:158.1] Out: 10 [Blood:10] Intake/Output this shift: No intake/output data recorded.  Proximal portion of the bandage has urine present.  Otherwise the bandage is dry and intact.  Some strikethrough with moderate to heavy bleeding on the bandage.  Upon removal the incision appears well coapted with skin edges viable and no signs of any purulence.   Lab Results:  Recent Labs    11/26/21 0600 11/27/21 0510  WBC 16.8* 15.8*  HGB 12.7 10.2*  HCT 40.6 32.7*  PLT 307 287   BMET Recent Labs    11/26/21 0600 11/27/21 0510  NA 138 137  K 4.7 4.4  CL 108 111  CO2 22 22  GLUCOSE 149* 214*  BUN 20 24*  CREATININE 1.14* 1.24*  CALCIUM 8.4* 8.2*   PT/INR No results for input(s): LABPROT, INR in the last 72 hours. ABG No results for input(s): PHART, HCO3 in the last 72 hours.  Invalid input(s): PCO2, PO2  Studies/Results: No results found.  Anti-infectives: Anti-infectives (From admission, onward)    Start     Dose/Rate Route Frequency Ordered Stop   11/26/21 1800  ceFEPIme (MAXIPIME) 2 g in sodium chloride 0.9 % 100 mL IVPB        2 g 200 mL/hr over 30 Minutes Intravenous Every 12 hours 11/26/21 1431     11/26/21 1445  metroNIDAZOLE (FLAGYL) tablet 500 mg        500 mg Oral 2 times daily 11/26/21 1427     11/26/21 1400  DAPTOmycin (CUBICIN) 400 mg in sodium chloride 0.9 % IVPB        6 mg/kg  67 kg 116 mL/hr over 30 Minutes Intravenous Daily 11/26/21 1034     11/24/21 2200  ceFEPIme (MAXIPIME) 2 g in  sodium chloride 0.9 % 100 mL IVPB  Status:  Discontinued        2 g 200 mL/hr over 30 Minutes Intravenous Every 12 hours 11/24/21 1617 11/24/21 1842   11/24/21 2200  linezolid (ZYVOX) IVPB 600 mg  Status:  Discontinued        600 mg 300 mL/hr over 60 Minutes Intravenous Every 12 hours 11/24/21 1617 11/24/21 1626   11/24/21 2200  Ampicillin-Sulbactam (UNASYN) 3 g in sodium chloride 0.9 % 100 mL IVPB  Status:  Discontinued        3 g 200 mL/hr over 30 Minutes Intravenous Every 6 hours 11/24/21 1847 11/26/21 1034   11/24/21 1200  ceFEPIme (MAXIPIME) 2 g in sodium chloride 0.9 % 100 mL IVPB        2 g 200 mL/hr over 30 Minutes Intravenous  Once 11/24/21 1157 11/24/21 1258   11/24/21 1200  metroNIDAZOLE (FLAGYL) IVPB 500 mg        500 mg 100 mL/hr over 60 Minutes Intravenous  Once 11/24/21 1157 11/24/21 1326   11/24/21 1200  vancomycin (VANCOCIN) IVPB 1000 mg/200 mL premix        1,000 mg 200 mL/hr over 60 Minutes Intravenous  Once 11/24/21 1157 11/24/21 1500  Assessment/Plan: s/p Procedure(s): AMPUTATION FIRST RAY (Left) Assessment: Stable status post first ray resection left foot.  Plan: Betadine and sterile bandage reapplied to the left foot.  Plan for reevaluation of the wound most likely Wednesday if patient is still in the hospital.  Waiting for cultures and pathology.  LOS: 3 days    Durward Fortes 11/27/2021

## 2021-11-27 NOTE — Consult Note (Signed)
NAME: Beth Mcguire  DOB: 1940-10-30  MRN: [id number]  Date/Time: 11/27/2021 10:55 AM  REQUESTING PROVIDER: Dr. Grandville Silos Subjective:  REASON FOR CONSULT: Left foot infection No history available from patient.  She is a poor historian.  Daughter at bedside gave history.  Chart reviewed completely. Beth Mcguire is a 81 y.o. female with a history of dementia, hypertension, diabetes mellitus came to the hospital because of worsening wound on the left foot.  Patient has had a wound on the great toe medial margin of the MTP joint level and was being followed by podiatrist as outpatient.  She recently was given doxycycline and Flagyl.  Wound cultures done in feb 2023 and Nov 2022 was bacteroides and  Enterococcus.  In the ED on presentation on 11/24/2021 BP 123/53, temperature 99, pulse 59, sats 100%.  WBC 11.2, Hb 12.3, platelet 353 and creatinine 1.14. She initially was given vancomycin but that caused hives.  It was changed to Unasyn as patient had taken amoxicillin without a problem before.  But after a few doses she had a erythematous rash over the infusing arm  along with axillary and inguinal area.  Antibiotic was changed to daptomycin cefepime and Flagyl. 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  I am asked to see her  for antibiotic management Past Medical History:  Diagnosis Date   Anxiety    Colon cancer ([place])    Dementia ([place]) 2022   Diabetes ([place])    Hypertension    Peripheral vascular disease ([place])     Past Surgical History:  Procedure Laterality Date   AMPUTATION Right 01/14/2016   Procedure: AMPUTATION RAY;  Surgeon: Albertine Patricia, DPM;  Location: [facility];  Service: Podiatry;  Laterality: Right;   AMPUTATION Left 11/26/2021   Procedure: AMPUTATION FIRST RAY;  Surgeon: Sharlotte Alamo, DPM;  Location: [facility];  Service: Podiatry;  Laterality: Left;   COLON SURGERY     LOWER EXTREMITY ANGIOGRAPHY Left 08/14/2017   Procedure: Lower Extremity  Angiography;  Surgeon: Algernon Huxley, MD;  Location: [place] [facility];  Service: Cardiovascular;  Laterality: Left;   LOWER EXTREMITY ANGIOGRAPHY Left 11/06/2021   Procedure: LOWER EXTREMITY ANGIOGRAPHY;  Surgeon: Algernon Huxley, MD;  Location: [place] [facility];  Service: Cardiovascular;  Laterality: Left;   PERIPHERAL VASCULAR CATHETERIZATION N/A 01/05/2016   Procedure: Lower Extremity Angiography;  Surgeon: Algernon Huxley, MD;  Location: [place] [facility];  Service: Cardiovascular;  Laterality: N/A;   PERIPHERAL VASCULAR CATHETERIZATION  01/05/2016   Procedure: Lower Extremity Intervention;  Surgeon: Algernon Huxley, MD;  Location: [place] [facility];  Service: Cardiovascular;;   PERIPHERAL VASCULAR CATHETERIZATION Right 06/25/2016   Procedure: Lower Extremity Angiography;  Surgeon: Algernon Huxley, MD;  Location: [place] [facility];  Service: Cardiovascular;  Laterality: Right;   PERIPHERAL VASCULAR CATHETERIZATION  06/25/2016   Procedure: Lower Extremity Intervention;  Surgeon: Algernon Huxley, MD;  Location: [place] [facility];  Service: Cardiovascular;;   TONSILLECTOMY      Social History   Socioeconomic History   Marital status: Widowed    Spouse name: Not on file   Number of children: 3   Years of education: Not on file   Highest education level: Not on file  Occupational History   Not on file  Tobacco Use   Smoking status: Never   Smokeless tobacco: Never  Substance and Sexual Activity   Alcohol use: No    Alcohol/week: 0.0 standard drinks  Drug use: No   Sexual activity: Not Currently  Other Topics Concern   Not on file  Social History Narrative   Daughter And her 2 kids    lives with pt.    Social Determinants of Health   Financial Resource Strain: Not on file  Food Insecurity: Not on file  Transportation Needs: Not on file  Physical Activity: Not on file  Stress: Not on file  Social Connections: Not on file  Intimate Partner Violence: Not on file    Family  History  Problem Relation Age of Onset   Diabetes Other    Allergies  Allergen Reactions   Amoxicillin-Pot Clavulanate Rash   I? Current Facility-Administered Medications  Medication Dose Route Frequency Provider Last Rate Last Admin   0.9 %  sodium chloride infusion   Intravenous PRN Sharlotte Alamo, DPM 10 mL/hr at 11/26/21 0413 New Bag at 11/26/21 0413   0.9 %  sodium chloride infusion   Intravenous Continuous Sharlotte Alamo, DPM 75 mL/hr at 11/27/21 0431 New Bag at 11/27/21 0431   acetaminophen (TYLENOL) tablet 650 mg  650 mg Oral Q4H PRN Sharlotte Alamo, DPM       ascorbic acid (VITAMIN C) tablet 500 mg  500 mg Oral BID Sharlotte Alamo, DPM   500 mg at 11/27/21 0848   aspirin EC tablet 81 mg  81 mg Oral Daily Sharlotte Alamo, DPM   81 mg at 11/27/21 0849   atorvastatin (LIPITOR) tablet 10 mg  10 mg Oral QPM Sharlotte Alamo, DPM   10 mg at 11/26/21 1706   bisacodyl (DULCOLAX) EC tablet 5 mg  5 mg Oral Daily PRN Sharlotte Alamo, DPM       ceFEPIme (MAXIPIME) 2 g in sodium chloride 0.9 % 100 mL IVPB  2 g Intravenous Q12H Lockie Mola B, RPH 200 mL/hr at 11/27/21 0851 2 g at 11/27/21 0851   DAPTOmycin (CUBICIN) 400 mg in sodium chloride 0.9 % IVPB  6 mg/kg Intravenous Q2000 Benita Gutter, RPH   Stopped at 11/26/21 1539   diphenhydrAMINE (BENADRYL) capsule 25 mg  25 mg Oral Q6H PRN Sharlotte Alamo, DPM       diphenhydrAMINE (BENADRYL) capsule 25 mg  25 mg Oral BID Eugenie Filler, MD   25 mg at 11/27/21 0848   enoxaparin (LOVENOX) injection 40 mg  40 mg Subcutaneous Q24H Sharlotte Alamo, DPM   40 mg at 11/26/21 2021   famotidine (PEPCID) tablet 20 mg  20 mg Oral Daily Eugenie Filler, MD   20 mg at 11/27/21 0848   HYDROcodone-acetaminophen (NORCO/VICODIN) 5-325 MG per tablet 1 tablet  1 tablet Oral Q4H PRN Sharlotte Alamo, DPM       HYDROmorphone (DILAUDID) injection 0.5 mg  0.5 mg Intravenous Q4H PRN Sharlotte Alamo, DPM       insulin aspart (novoLOG) injection 0-9 Units  0-9 Units Subcutaneous Q4H Sharlotte Alamo, DPM   1  Units at 11/27/21 0848   [START ON 11/28/2021] lisinopril (ZESTRIL) tablet 20 mg  20 mg Oral Daily Eugenie Filler, MD       metroNIDAZOLE (FLAGYL) tablet 500 mg  500 mg Oral BID Eugenie Filler, MD   500 mg at 11/27/21 0848   morphine (PF) 2 MG/ML injection 2 mg  2 mg Intravenous Q2H PRN Sharlotte Alamo, DPM       ondansetron [facility]) tablet 4 mg  4 mg Oral Q6H PRN Sharlotte Alamo, DPM       Or   ondansetron [facility]) injection  4 mg  4 mg Intravenous Q6H PRN Sharlotte Alamo, DPM       zinc sulfate capsule 220 mg  220 mg Oral Daily Sharlotte Alamo, DPM   220 mg at 11/27/21 8338     Abtx:  Anti-infectives (From admission, onward)    Start     Dose/Rate Route Frequency Ordered Stop   11/26/21 1800  ceFEPIme (MAXIPIME) 2 g in sodium chloride 0.9 % 100 mL IVPB        2 g 200 mL/hr over 30 Minutes Intravenous Every 12 hours 11/26/21 1431     11/26/21 1445  metroNIDAZOLE (FLAGYL) tablet 500 mg        500 mg Oral 2 times daily 11/26/21 1427     11/26/21 1400  DAPTOmycin (CUBICIN) 400 mg in sodium chloride 0.9 % IVPB        6 mg/kg  67 kg 116 mL/hr over 30 Minutes Intravenous Daily 11/26/21 1034     11/24/21 2200  ceFEPIme (MAXIPIME) 2 g in sodium chloride 0.9 % 100 mL IVPB  Status:  Discontinued        2 g 200 mL/hr over 30 Minutes Intravenous Every 12 hours 11/24/21 1617 11/24/21 1842   11/24/21 2200  linezolid (ZYVOX) IVPB 600 mg  Status:  Discontinued        600 mg 300 mL/hr over 60 Minutes Intravenous Every 12 hours 11/24/21 1617 11/24/21 1626   11/24/21 2200  Ampicillin-Sulbactam (UNASYN) 3 g in sodium chloride 0.9 % 100 mL IVPB  Status:  Discontinued        3 g 200 mL/hr over 30 Minutes Intravenous Every 6 hours 11/24/21 1847 11/26/21 1034   11/24/21 1200  ceFEPIme (MAXIPIME) 2 g in sodium chloride 0.9 % 100 mL IVPB        2 g 200 mL/hr over 30 Minutes Intravenous  Once 11/24/21 1157 11/24/21 1258   11/24/21 1200  metroNIDAZOLE (FLAGYL) IVPB 500 mg        500 mg 100 mL/hr over 60 Minutes  Intravenous  Once 11/24/21 1157 11/24/21 1326   11/24/21 1200  vancomycin (VANCOCIN) IVPB 1000 mg/200 mL premix        1,000 mg 200 mL/hr over 60 Minutes Intravenous  Once 11/24/21 1157 11/24/21 1500       REVIEW OF SYSTEMS:  Const: negative fever, negative chills, negative weight loss Eyes: negative diplopia or visual changes, negative eye pain ENT: negative coryza, negative sore throat Resp: negative cough, hemoptysis, dyspnea Cards: negative for chest pain, palpitations, lower extremity edema GU: negative for frequency, dysuria and hematuria GI: Negative for abdominal pain, diarrhea, bleeding, constipation Skin: has pruritus Heme: negative for easy bruising and gum/nose bleeding MS: general weakness Neurolo:memory issues  Psych: negative for feelings of anxiety, depression  Endocrine: diabetes Allergy/Immunology- augmentin Objective:  VITALS:  BP 116/79 (BP Location: Right Arm)    Pulse 68    Temp 98.3 F (36.8 C)    Resp 20    SpO2 98%  PHYSICAL EXAM:  General: awake, cooperative, no distress, appears stated age. Answers simple questions, but oriented only in self Head: Normocephalic, without obvious abnormality, atraumatic. Eyes: Conjunctivae clear, anicteric sclerae. Pupils are equal ENT Nares normal. No drainage or sinus tenderness. Lips, mucosa, and tongue normal. No Thrush Neck: Supple, symmetrical, no adenopathy, thyroid: non tender no carotid bruit and no JVD. Back: No CVA tenderness. Lungs: Clear to auscultation bilaterally. No Wheezing or Rhonchi. No rales. Heart: Regular rate and rhythm, no murmur, rub or  gallop. Abdomen: Soft, non-tender,not distended. Bowel sounds normal. No masses Extremities: left foot pictures reviewed pre and post ray excision      Skin: erythematous rash over arms, chest and groin       Lymph: Cervical, supraclavicular normal. Neurologic: Grossly non-focal Pertinent Labs Lab Results CBC    Component Value Date/Time   WBC  15.8 (H) 11/27/2021 0510   RBC 3.68 (L) 11/27/2021 0510   HGB 10.2 (L) 11/27/2021 0510   HGB 13.2 12/04/2013 0953   HCT 32.7 (L) 11/27/2021 0510   HCT 41.0 12/04/2013 0953   PLT 287 11/27/2021 0510   PLT 324 12/04/2013 0953   MCV 88.9 11/27/2021 0510   MCV 85 12/04/2013 0953   MCH 27.7 11/27/2021 0510   MCHC 31.2 11/27/2021 0510   RDW 15.2 11/27/2021 0510   RDW 14.1 12/04/2013 0953   LYMPHSABS 1.5 11/27/2021 0510   LYMPHSABS 2.3 12/04/2013 0953   MONOABS 0.5 11/27/2021 0510   MONOABS 0.4 12/04/2013 0953   EOSABS 0.0 11/27/2021 0510   EOSABS 0.2 12/04/2013 0953   BASOSABS 0.0 11/27/2021 0510   BASOSABS 0.1 12/04/2013 0953    CMP Latest Ref Rng & Units 11/27/2021 11/26/2021 11/25/2021  Glucose 70 - 99 mg/dL 214(H) 149(H) 110(H)  BUN 8 - 23 mg/dL 24(H) 20 22  Creatinine 0.44 - 1.00 mg/dL 1.24(H) 1.14(H) 1.08(H)  Sodium 135 - 145 mmol/L 137 138 138  Potassium 3.5 - 5.1 mmol/L 4.4 4.7 4.9  Chloride 98 - 111 mmol/L 111 108 110  CO2 22 - 32 mmol/L 22 22 22   Calcium 8.9 - 10.3 mg/dL 8.2(L) 8.4(L) 8.5(L)  Total Protein 6.5 - 8.1 g/dL - - -  Total Bilirubin 0.3 - 1.2 mg/dL - - -  Alkaline Phos 38 - 126 U/L - - -  AST 15 - 41 U/L - - -  ALT 0 - 44 U/L - - -      Microbiology: Recent Results (from the past 240 hour(s))  Resp Panel by RT-PCR (Flu A&B, Covid) Nasopharyngeal Swab     Status: Abnormal   Collection Time: 11/24/21 11:57 AM   Specimen: Nasopharyngeal Swab; Nasopharyngeal(NP) swabs in vial transport medium  Result Value Ref Range Status   SARS Coronavirus 2 by RT PCR POSITIVE (A) NEGATIVE Final    Comment: (NOTE) SARS-CoV-2 target nucleic acids are DETECTED.  The SARS-CoV-2 RNA is generally detectable in upper respiratory specimens during the acute phase of infection. Positive results are indicative of the presence of the identified virus, but do not rule out bacterial infection or co-infection with other pathogens not detected by the test. Clinical correlation with  patient history and other diagnostic information is necessary to determine patient infection status. The expected result is Negative.  Fact Sheet for Patients: EntrepreneurPulse.com.au  Fact Sheet for Healthcare Providers: IncredibleEmployment.be  This test is not yet approved or cleared by the [place] FDA and  has been authorized for detection and/or diagnosis of SARS-CoV-2 by FDA under an Emergency Use Authorization (EUA).  This EUA will remain in effect (meaning this test can be used) for the duration of  the COVID-19 declaration under Section 564(b)(1) of the A ct, 21 U.S.C. section 360bbb-3(b)(1), unless the authorization is terminated or revoked sooner.     Influenza A by PCR NEGATIVE NEGATIVE Final   Influenza B by PCR NEGATIVE NEGATIVE Final    Comment: (NOTE) The Xpert Xpress SARS-CoV-2/FLU/RSV plus assay is intended as an aid in the diagnosis of influenza from Nasopharyngeal swab specimens  and should not be used as a sole basis for treatment. Nasal washings and aspirates are unacceptable for Xpert Xpress SARS-CoV-2/FLU/RSV testing.  Fact Sheet for Patients: EntrepreneurPulse.com.au  Fact Sheet for Healthcare Providers: IncredibleEmployment.be  This test is not yet approved or cleared by the [place] FDA and has been authorized for detection and/or diagnosis of SARS-CoV-2 by FDA under an Emergency Use Authorization (EUA). This EUA will remain in effect (meaning this test can be used) for the duration of the COVID-19 declaration under Section 564(b)(1) of the Act, 21 U.S.C. section 360bbb-3(b)(1), unless the authorization is terminated or revoked.  Performed at [facility], [address] [place], [place] [address]   Surgical pcr screen     Status: None   Collection Time: 11/24/21  4:59 PM   Specimen: Nasal Mucosa; Nasal Swab  Result Value Ref Range Status   MRSA, PCR  NEGATIVE NEGATIVE Final   Staphylococcus aureus NEGATIVE NEGATIVE Final    Comment: (NOTE) The Xpert SA Assay (FDA approved for NASAL specimens in patients 56 years of age and older), is one component of a comprehensive surveillance program. It is not intended to diagnose infection nor to guide or monitor treatment. Performed at [facility], [address], [place], [place] [address]   Aerobic/Anaerobic Culture w Gram Stain (surgical/deep wound)     Status: None (Preliminary result)   Collection Time: 11/26/21  9:17 AM   Specimen: PATH GI Other; Tissue  Result Value Ref Range Status   Specimen Description   Final    TISSUE Performed at [facility], [place] [address], [place], [place] [address]    Special Requests   Final    NONE Performed at [facility], [place], [place] [address]    Gram Stain NO WBC SEEN NO ORGANISMS SEEN   Final   Culture   Final    NO GROWTH < 24 HOURS Performed at [facility], [place] [address], [place], [place] [address]    Report Status PENDING  Incomplete    IMAGING RESULTS: Doppler both legs- no DVT  I have personally reviewed the films ? Impression/Recommendation Diabetes mellitus left great toe wound. 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    Erythematous rash secondary to possible antibiotic allergy Initially thought to be due to vancomycin "which could have caused "red man" syndrome.  After that was given Unasyn which made it worse.  So both antibiotics have been stopped.    Diabetes mellitus management as per primary team  Dementia  CKD  Discussed the management with the patient's daughter.  And her nurse. ? ___________________________________________________ Discussed with patient,  requesting provider Note:  This document was prepared using Dragon voice recognition software and may include unintentional dictation errors.

## 2021-11-27 NOTE — Progress Notes (Signed)
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  Patient noted to have had recent revascularization by vascular surgery a few weeks ago.  Patient noted to have completed 10-day course of doxycycline in the outpatient setting and started on Flagyl 2 days prior to admission.  Patient evaluated by podiatry on day of admission noted to have exposed bone and joint and admitted for osteomyelitis with exposed first metatarsal phalangeal joint of the left foot and admitted for amputation of the left great toe with partial ray resection to be done by podiatry on 11/26/2021.  Plavix held.  Patient placed empirically on IV antibiotics of Unasyn.  Podiatry following.  Patient noted to have had a prior COVID infection on 10/16/2021 and treated in the outpatient setting.   Assessment & Plan:   Principal Problem:   Osteomyelitis ([place]) Active Problems:   Foot osteomyelitis, right ([place])   Hypertension   COVID-19 virus infection: RECENT PSOTIVE TEST 10/16/2021   PVD (peripheral vascular disease) ([place])   Rash   Acute urinary retention  #1 osteomyelitis of the left foot/left foot infected decubitus ulcer -Patient admitted after failing outpatient treatment. -Patient noted to have finished a full course of doxycycline and started on Flagyl 2 days prior to admission. -Patient admitted and evaluated for podiatry and patient scheduled for amputation of the left great toe and partial ray resection on 11/26/2021. -Podiatry not recommending any further imaging as clear  finding noted on plain films of the foot for osteomyelitis. -Plavix on hold. -Patient initially was to be placed on IV vancomycin, cefepime and Flagyl however while patient receiving IV vancomycin there was some concern for rash in the upper extremity. -Admitting physician discussed case with ID attending, Dr.Ravishanker who had recommended IV Unasyn to cover for Enterococcus species. -Patient noted to have developed rash while on IV Unasyn and as such this has been discontinued. -Patient status post amputation left hallux with partial ray resection by podiatry, Dr. Cleda Mcguire 11/26/2020 -Curbsided ID on-call who recommended initiation of IV daptomycin, IV cefepime, oral Flagyl pending bone cultures postoperatively and until patient able to be assessed formally by ID. -Per podiatry.  2.  Peripheral vascular disease with recent left SFA stenting -Continue aspirin, statin. -Resume Plavix today.  3.  Insulin-dependent diabetes mellitus with episodic hypoglycemia -Metformin discontinued.   -Hemoglobin A1c 6.7. -CBG 204 this morning however patient did not receive steroids yesterday. -SSI.  4.  Hypertension -Controlled on lisinopril.  5.  COVID-19 virus infection (10/16/2021) -Patient noted to have had COVID-19 virus infection 10/16/2021 treated in the outpatient setting and currently asymptomatic and not hypoxic. -No further treatment needed at this time. -No isolation needed. -Continue vitamin C, zinc.   6.  Rash -Likely drug-induced rash. -Patient noted to have a macular rash bilateral upper extremities, upper torso, upper back in between IV antibiotics of Unasyn. -Patient noted to have a similar reaction on presentation after a dose of IV vancomycin. -Patient placed on Benadryl, Pepcid.  IV Solu-Medrol 40 mg p.o. x1. -Discontinued IV Unasyn. -Placed on IV daptomycin, IV cefepime, oral Flagyl after discussion with Dr. Juleen China of ID until  patient can be seen in formal consultation  11/27/2021. -Patient with diffuse rash now with rash in the groin region. -Continue Benadryl, Pepcid.  7.  Urinary retention ruled out -Concern for urinary retention 11/26/2021  -I and o cath x1 ordered however patient went to bedside commode and noted to have good urine output with minimal PVR. -Follow.  8.  Confusion -Patient noted with confusion this morning per RN. -During my assessment patient still with confusion however improved from earlier on this morning. -Moving extremities spontaneously. -No focal neurological deficits. -??  Secondary to steroids. -Resume home regimen Plavix. -Follow.   DVT prophylaxis: Lovenox Code Status: Full Family Communication: Updated patient, daughter at bedside. Disposition:   Status is: Inpatient Remains inpatient appropriate because: Severity of illness           Consultants:  Podiatry: Dr.Cline 11/24/2021 ID consultation pending  Procedures:  Lower extremity Dopplers 11/24/2021 Amputation left hallux with partial ray resection (11/26/2021)  Antimicrobials:  IV Unasyn 11/24/2021>>>> 11/26/2021 IV daptomycin 11/26/2021>>> IV cefepime 11/26/2021>>>> Oral Flagyl 11/26/2021>>>.   Subjective: Patient confused this AM per nursing, some combativeness and stuck RN with needle. No CP. No SOB. Asking for her diaper. Alert ot self and place.  Objective: Vitals:   11/26/21 1637 11/26/21 2005 11/27/21 0009 11/27/21 0426  BP: 139/82 (!) 144/50 (!) 134/50 116/79  Pulse: 74 64 65 68  Resp: 18 18 18 20   Temp: 98.1 F (36.7 C) 98.6 F (37 C) 98.6 F (37 C) 98.3 F (36.8 C)  TempSrc:  Oral    SpO2: 97% 97% 99% 98%    Intake/Output Summary (Last 24 hours) at 11/27/2021 0945 Last data filed at 11/27/2021 0559 Gross per 24 hour  Intake 196.2 ml  Output 0 ml  Net 196.2 ml    There were no vitals filed for this visit.  Examination:  General exam: NAD. Respiratory system: Lungs clear to auscultation bilaterally.  No wheezes, no  crackles, no rhonchi.  Normal respiratory effort.   Cardiovascular system: RRR no murmurs rubs or gallops.  No JVD.  No lower extremity edema.   Gastrointestinal system: Abdomen is soft, nontender, nondistended, positive bowel sounds.  No rebound.  No guarding. Central nervous system: Alert and oriented. No focal neurological deficits. Extremities: Left foot in postop bandage.   Skin: Diffuse macular rash on bilateral upper extremities, upper torso, groin region, less rash on upper back which is somewhat blanchable.  Psychiatry: Judgement and insight appear normal. Mood & affect appropriate.      Data Reviewed: I have personally reviewed following labs and imaging studies  CBC: Recent Labs  Lab 11/24/21 1039 11/25/21 0503 11/26/21 0600 11/27/21 0510  WBC 11.2* 10.1 16.8* 15.8*  NEUTROABS 7.3  --  13.3* 13.6*  HGB 12.3 10.4* 12.7 10.2*  HCT 39.8 33.0* 40.6 32.7*  MCV 90.5 88.5 89.2 88.9  PLT 353 289 307 287     Basic Metabolic Panel: Recent Labs  Lab 11/24/21 1039 11/25/21 0503 11/26/21 0600 11/27/21 0510  NA 138 138 138 137  K 4.7 4.9 4.7 4.4  CL 108 110 108 111  CO2 24 22 22 22   GLUCOSE 156* 110* 149* 214*  BUN 26* 22 20 24*  CREATININE 1.14* 1.08* 1.14* 1.24*  CALCIUM 9.1 8.5* 8.4* 8.2*     GFR: Estimated Creatinine Clearance: 31.6 mL/min (A) (by C-G formula based on SCr of 1.24 mg/dL (H)).  Liver Function Tests: Recent Labs  Lab 11/24/21 1039  AST 17  ALT 12  ALKPHOS  130*  BILITOT 0.5  PROT 6.7  ALBUMIN 3.2*     CBG: Recent Labs  Lab 11/26/21 1642 11/26/21 2006 11/27/21 0000 11/27/21 0426 11/27/21 0822  GLUCAP 191* 247* 227* 204* 148*      Recent Results (from the past 240 hour(s))  Resp Panel by RT-PCR (Flu A&B, Covid) Nasopharyngeal Swab     Status: Abnormal   Collection Time: 11/24/21 11:57 AM   Specimen: Nasopharyngeal Swab; Nasopharyngeal(NP) swabs in vial transport medium  Result Value Ref Range Status   SARS Coronavirus 2 by  RT PCR POSITIVE (A) NEGATIVE Final    Comment: (NOTE) SARS-CoV-2 target nucleic acids are DETECTED.  The SARS-CoV-2 RNA is generally detectable in upper respiratory specimens during the acute phase of infection. Positive results are indicative of the presence of the identified virus, but do not rule out bacterial infection or co-infection with other pathogens not detected by the test. Clinical correlation with patient history and other diagnostic information is necessary to determine patient infection status. The expected result is Negative.  Fact Sheet for Patients: EntrepreneurPulse.com.au  Fact Sheet for Healthcare Providers: IncredibleEmployment.be  This test is not yet approved or cleared by the [place] FDA and  has been authorized for detection and/or diagnosis of SARS-CoV-2 by FDA under an Emergency Use Authorization (EUA).  This EUA will remain in effect (meaning this test can be used) for the duration of  the COVID-19 declaration under Section 564(b)(1) of the A ct, 21 U.S.C. section 360bbb-3(b)(1), unless the authorization is terminated or revoked sooner.     Influenza A by PCR NEGATIVE NEGATIVE Final   Influenza B by PCR NEGATIVE NEGATIVE Final    Comment: (NOTE) The Xpert Xpress SARS-CoV-2/FLU/RSV plus assay is intended as an aid in the diagnosis of influenza from Nasopharyngeal swab specimens and should not be used as a sole basis for treatment. Nasal washings and aspirates are unacceptable for Xpert Xpress SARS-CoV-2/FLU/RSV testing.  Fact Sheet for Patients: EntrepreneurPulse.com.au  Fact Sheet for Healthcare Providers: IncredibleEmployment.be  This test is not yet approved or cleared by the [place] FDA and has been authorized for detection and/or diagnosis of SARS-CoV-2 by FDA under an Emergency Use Authorization (EUA). This EUA will remain in effect (meaning this test can be  used) for the duration of the COVID-19 declaration under Section 564(b)(1) of the Act, 21 U.S.C. section 360bbb-3(b)(1), unless the authorization is terminated or revoked.  Performed at [facility], [address] [place], [place] [address]   Surgical pcr screen     Status: None   Collection Time: 11/24/21  4:59 PM   Specimen: Nasal Mucosa; Nasal Swab  Result Value Ref Range Status   MRSA, PCR NEGATIVE NEGATIVE Final   Staphylococcus aureus NEGATIVE NEGATIVE Final    Comment: (NOTE) The Xpert SA Assay (FDA approved for NASAL specimens in patients 75 years of age and older), is one component of a comprehensive surveillance program. It is not intended to diagnose infection nor to guide or monitor treatment. Performed at [facility], [address] [place], [place] [address]   Aerobic/Anaerobic Culture w Gram Stain (surgical/deep wound)     Status: None (Preliminary result)   Collection Time: 11/26/21  9:17 AM   Specimen: PATH GI Other; Tissue  Result Value Ref Range Status   Specimen Description   Final    TISSUE Performed at [facility], [place] [address] [place], [place] [address]    Special Requests   Final  NONE Performed at [facility], [place], [place] [address]    Gram Stain NO WBC SEEN NO ORGANISMS SEEN   Final   Culture   Final    NO GROWTH < 24 HOURS Performed at [facility], [place] [address] [place], [place] [address]    Report Status PENDING  Incomplete          Radiology Studies: No results found.      Scheduled Meds:  vitamin C  500 mg Oral BID   aspirin EC  81 mg Oral Daily   atorvastatin  10 mg Oral QPM   diphenhydrAMINE  25 mg Oral BID   enoxaparin (LOVENOX) injection  40 mg Subcutaneous Q24H   famotidine  20 mg Oral Daily   insulin aspart  0-9 Units Subcutaneous Q4H   [START ON 11/28/2021] lisinopril  20 mg Oral Daily   metroNIDAZOLE  500 mg Oral BID   zinc sulfate  220 mg  Oral Daily   Continuous Infusions:  sodium chloride 10 mL/hr at 11/26/21 0413   sodium chloride 75 mL/hr at 11/27/21 0431   ceFEPime (MAXIPIME) IV 2 g (11/27/21 0851)   DAPTOmycin (CUBICIN)  IV Stopped (11/26/21 1539)     LOS: 3 days    Time spent: 35 minutes    Irine Seal, MD [facility]   To contact the attending provider between 7A-7P or the covering provider during after hours 7P-7A, please log into the web site [email or web address] and access using universal [place] password for that web site. If you do not have the password, please call the hospital operator.  11/27/2021, 9:45 AM

## 2021-11-27 NOTE — TOC Initial Note (Signed)
Transition of Care [facility]) - Initial/Assessment Note    Patient Details  Name: Beth Mcguire MRN: [id number] Date of Birth: 1940-10-17  [facility]) CM/SW Contact:    Conception Oms, RN Phone Number: 11/27/2021, 9:34 AM  Clinical Narrative:         The patient comes from home, Cultures Pending, PT to eval, TOC to follow and monitor for DC assistance                 Patient Goals and CMS Choice        Expected Discharge Plan and Services           Expected Discharge Date: 11/28/21                                    Prior Living Arrangements/Services                       Activities of Daily Living Home Assistive Devices/Equipment: Gilford Rile (specify type), Wheelchair, Raised toilet seat with rails ADL Screening (condition at time of admission) Patient's cognitive ability adequate to safely complete daily activities?: No Is the patient deaf or have difficulty hearing?: No Does the patient have difficulty seeing, even when wearing glasses/contacts?: No Does the patient have difficulty concentrating, remembering, or making decisions?: Yes Patient able to express need for assistance with ADLs?: Yes Does the patient have difficulty dressing or bathing?: Yes Independently performs ADLs?: No Communication: Independent Dressing (OT): Needs assistance Is this a change from baseline?: Pre-admission baseline Grooming: Needs assistance Feeding: Independent Bathing: Needs assistance Is this a change from baseline?: Pre-admission baseline Toileting: Needs assistance Is this a change from baseline?: Pre-admission baseline In/Out Bed: Needs assistance Is this a change from baseline?: Pre-admission baseline Walks in Home: Independent Does the patient have difficulty walking or climbing stairs?: Yes Weakness of Legs: Both Weakness of Arms/Hands: None  Permission Sought/Granted                  Emotional Assessment               Admission diagnosis:  Edema [R60.9] Osteomyelitis ([place]) [M86.9] Osteomyelitis of left foot, unspecified type ([place]) [M86.9] Patient Active Problem List   Diagnosis Date Noted   Rash 11/26/2021   Acute urinary retention 11/26/2021   COVID-19 virus infection: RECENT PSOTIVE TEST 10/16/2021 11/25/2021   PVD (peripheral vascular disease) ([place])    PAD (peripheral artery disease) ([place]) 09/18/2017   Pure hypercholesterolemia 08/13/2017   Hyperlipidemia 07/25/2016   Hypertension 07/25/2016   Diabetes ([place]) 07/25/2016   Atherosclerosis of native arteries of the extremities with ulceration ([place]) 07/25/2016   Foot osteomyelitis, right ([place]) 01/12/2016   Osteomyelitis ([place]) 01/12/2016   Toe ulcer due to DM ([place]) 01/04/2016   PCP:  [name], MD Pharmacy:   [facility] [place], [place] - [address] [facility] [address] [place] [address] Phone: [phone] Fax: [place] [phone] [facility], [place] AT [facility] [address] [place] [address] Phone: [phone] Fax: [phone]     Social Determinants of Health (SDOH) Interventions    Readmission Risk Interventions No flowsheet data found.

## 2021-11-27 NOTE — Plan of Care (Signed)
°  Problem: Education: Goal: Knowledge of General Education information will improve Description: Including pain rating scale, medication(s)/side effects and non-pharmacologic comfort measures Outcome: Progressing   Problem: Clinical Measurements: Goal: Ability to maintain clinical measurements within normal limits will improve Outcome: Progressing   Problem: Clinical Measurements: Goal: Diagnostic test results will improve Outcome: Progressing   Problem: Clinical Measurements: Goal: Cardiovascular complication will be avoided Outcome: Progressing   Problem: Pain Managment: Goal: General experience of comfort will improve Outcome: Progressing

## 2021-11-27 NOTE — Care Management Important Message (Signed)
Important Message  Patient Details  Name: Beth Mcguire MRN: [id number] Date of Birth: 1941/07/15   Medicare Important Message Given:  N/A - LOS <3 / Initial given by admissions     Beth Mcguire 11/27/2021, 10:05 AM

## 2021-11-28 LAB — BASIC METABOLIC PANEL
Anion gap: 4 — ABNORMAL LOW (ref 5–15)
BUN: 23 mg/dL (ref 8–23)
CO2: 20 mmol/L — ABNORMAL LOW (ref 22–32)
Calcium: 8 mg/dL — ABNORMAL LOW (ref 8.9–10.3)
Chloride: 114 mmol/L — ABNORMAL HIGH (ref 98–111)
Creatinine, Ser: 1.11 mg/dL — ABNORMAL HIGH (ref 0.44–1.00)
GFR, Estimated: 49 mL/min — ABNORMAL LOW (ref >60)
Glucose, Bld: 99 mg/dL (ref 70–99)
Potassium: 4.4 mmol/L (ref 3.5–5.1)
Sodium: 138 mmol/L (ref 135–145)

## 2021-11-28 LAB — CBC WITH DIFFERENTIAL/PLATELET
Abs Immature Granulocytes: 0.07 10*3/uL (ref 0.00–0.07)
Basophils Absolute: 0.1 10*3/uL (ref 0.0–0.1)
Basophils Relative: 1 %
Eosinophils Absolute: 0.5 10*3/uL (ref 0.0–0.5)
Eosinophils Relative: 4 %
HCT: 31.9 % — ABNORMAL LOW (ref 36.0–46.0)
Hemoglobin: 10 g/dL — ABNORMAL LOW (ref 12.0–15.0)
Immature Granulocytes: 1 %
Lymphocytes Relative: 24 %
Lymphs Abs: 3.2 10*3/uL (ref 0.7–4.0)
MCH: 28.2 pg (ref 26.0–34.0)
MCHC: 31.3 g/dL (ref 30.0–36.0)
MCV: 89.9 fL (ref 80.0–100.0)
Monocytes Absolute: 0.7 10*3/uL (ref 0.1–1.0)
Monocytes Relative: 5 %
Neutro Abs: 8.7 10*3/uL — ABNORMAL HIGH (ref 1.7–7.7)
Neutrophils Relative %: 65 %
Platelets: 270 10*3/uL (ref 150–400)
RBC: 3.55 MIL/uL — ABNORMAL LOW (ref 3.87–5.11)
RDW: 15.6 % — ABNORMAL HIGH (ref 11.5–15.5)
WBC: 13.2 10*3/uL — ABNORMAL HIGH (ref 4.0–10.5)
nRBC: 0 % (ref 0.0–0.2)

## 2021-11-28 LAB — GLUCOSE, CAPILLARY
Glucose-Capillary: 112 mg/dL — ABNORMAL HIGH (ref 70–99)
Glucose-Capillary: 101 mg/dL — ABNORMAL HIGH (ref 70–99)
Glucose-Capillary: 195 mg/dL — ABNORMAL HIGH (ref 70–99)
Glucose-Capillary: 203 mg/dL — ABNORMAL HIGH (ref 70–99)
Glucose-Capillary: 338 mg/dL — ABNORMAL HIGH (ref 70–99)

## 2021-11-28 LAB — MAGNESIUM: Magnesium: 1.8 mg/dL (ref 1.7–2.4)

## 2021-11-28 LAB — SURGICAL PATHOLOGY

## 2021-11-28 MED ORDER — DIPHENHYDRAMINE HCL 25 MG PO CAPS
25.0000 mg | ORAL_CAPSULE | Freq: Every day | ORAL | Status: DC
  Filled 2021-11-28: qty 1

## 2021-11-28 NOTE — Evaluation (Signed)
Occupational Therapy Evaluation Patient Details Name: Beth Mcguire MRN: [id number] DOB: [birth year] Today's Date: 11/28/2021   History of Present Illness 81 y.o. female who presents s/p L foot amputation due to ulcer on the left toe that is exposing bone w/ X-Ray concerning for osteomyelitis. L hallux w/ partial ray resection performed. PmHx: DM, PVD, HTN, HLD, CKD, Colon Cancer, Anxiety.   Clinical Impression   Pt was seen for OT evaluation this date. Prior to hospital admission, pt was generally independent with basic ADL, living with children who provide assist for IADL. Currently pt demonstrates impairments as described below (See OT problem list) which functionally limit her ability to perform ADL/self-care tasks. Pt currently requires MIN A for LB dressing, bathing, and toileting (pericare and clothing mgt), with CGA for ADL transfers +RW + VC for hand placement intermittently to improve transfers technique. Pt would benefit from additional skilled OT services to address noted impairments and functional limitations (see below for any additional details) in order to maximize safety and independence while minimizing falls risk and caregiver burden.       Recommendations for follow up therapy are one component of a multi-disciplinary discharge planning process, led by the attending physician.  Recommendations may be updated based on patient status, additional functional criteria and insurance authorization.   Follow Up Recommendations  Follow physician's recommendations for discharge plan and follow up therapies    Assistance Recommended at Discharge Frequent or constant Supervision/Assistance  Patient can return home with the following A little help with walking and/or transfers;A little help with bathing/dressing/bathroom;Assistance with cooking/housework;Assist for transportation;Help with stairs or ramp for entrance;Direct supervision/assist for medications management    Functional  Status Assessment  Patient has had a recent decline in their functional status and demonstrates the ability to make significant improvements in function in a reasonable and predictable amount of time.  Equipment Recommendations  None recommended by OT    Recommendations for Other Services       Precautions / Restrictions Precautions Precautions: Fall Restrictions Weight Bearing Restrictions: Yes LLE Weight Bearing: Weight bearing as tolerated Other Position/Activity Restrictions: Per chart review pt with bathroom privileges with orthowedge post op shoe      Mobility Bed Mobility Overal bed mobility: Needs Assistance Bed Mobility: Supine to Sit, Sit to Supine     Supine to sit: Supervision, HOB elevated Sit to supine: Supervision        Transfers Overall transfer level: Needs assistance Equipment used: Rolling walker (2 wheels) Transfers: Sit to/from Stand Sit to Stand: Min guard                  Balance Overall balance assessment: Needs assistance Sitting-balance support: Single extremity supported, No upper extremity supported, Feet supported Sitting balance-Leahy Scale: Good     Standing balance support: During functional activity, Bilateral upper extremity supported, Single extremity supported Standing balance-Leahy Scale: Fair                             ADL either performed or assessed with clinical judgement   ADL Overall ADL's : Needs assistance/impaired                                       General ADL Comments: Pt required MIN A for pericare/clothing mgt after toileting for thoroughness after soft BM. PRN VC for sequencing during tasks. MIN  A for LB dressing for shoes.     Vision         Perception     Praxis      Pertinent Vitals/Pain Pain Assessment Pain Assessment: No/denies pain     Hand Dominance     Extremity/Trunk Assessment Upper Extremity Assessment Upper Extremity Assessment: Overall WFL for  tasks assessed;Generalized weakness   Lower Extremity Assessment Lower Extremity Assessment: Overall WFL for tasks assessed;Generalized weakness       Communication     Cognition Arousal/Alertness: Awake/alert Behavior During Therapy: WFL for tasks assessed/performed Overall Cognitive Status: Within Functional Limits for tasks assessed                                 General Comments: PRN VC for safety     General Comments       Exercises     Shoulder Instructions      Home Living Family/patient expects to be discharged to:: Private residence Living Arrangements: Children Available Help at Discharge: Family;Available 24 hours/day Type of Home: House Home Access: Ramped entrance     Home Layout: One level     Bathroom Shower/Tub: Occupational psychologist: Handicapped height Bathroom Accessibility: Yes   Home Equipment: Conservation officer, nature (2 wheels);Wheelchair - Brewing technologist - built in          Prior Functioning/Environment Prior Level of Function : Independent/Modified Independent                        OT Problem List: Decreased strength;Decreased safety awareness;Impaired balance (sitting and/or standing);Decreased knowledge of use of DME or AE;Decreased knowledge of precautions      OT Treatment/Interventions: Self-care/ADL training;Therapeutic activities;Therapeutic exercise;DME and/or AE instruction;Patient/family education;Balance training    OT Goals(Current goals can be found in the care plan section) Acute Rehab OT Goals Patient Stated Goal: go home tomorrow OT Goal Formulation: With patient/family Time For Goal Achievement: 12/12/21 Potential to Achieve Goals: Good ADL Goals Pt Will Perform Lower Body Dressing: with modified independence;sit to/from stand Pt Will Transfer to Toilet: with modified independence;ambulating (LRAD) Pt Will Perform Toileting - Clothing Manipulation and hygiene: with modified  independence;sit to/from stand  OT Frequency: Min 2X/week    Co-evaluation              AM-PAC OT "6 Clicks" Daily Activity     Outcome Measure Help from another person eating meals?: None Help from another person taking care of personal grooming?: A Little Help from another person toileting, which includes using toliet, bedpan, or urinal?: A Little Help from another person bathing (including washing, rinsing, drying)?: A Little Help from another person to put on and taking off regular upper body clothing?: A Little Help from another person to put on and taking off regular lower body clothing?: A Little 6 Click Score: 19   End of Session Equipment Utilized During Treatment: Rolling walker (2 wheels);Other (comment) (orthowedge shoe)  Activity Tolerance: Patient tolerated treatment well Patient left:    OT Visit Diagnosis: Other abnormalities of gait and mobility (R26.89)                Time: 1546-1610 OT Time Calculation (min): 24 min Charges:  OT General Charges $OT Visit: 1 Visit OT Evaluation $OT Eval Moderate Complexity: 1 Mod OT Treatments $Self Care/Home Management : 8-22 mins  Ardeth Perfect., MPH, MS, OTR/L ascom [phone] 11/28/21, 4:28 PM

## 2021-11-28 NOTE — Progress Notes (Signed)
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  Patient noted to have had recent revascularization by vascular surgery a few weeks ago.  Patient noted to have completed 10-day course of doxycycline in the outpatient setting and started on Flagyl 2 days prior to admission.  Patient evaluated by podiatry on day of admission noted to have exposed bone and joint and admitted for osteomyelitis with exposed first metatarsal phalangeal joint of the left foot and admitted for amputation of the left great toe with partial ray resection to be done by podiatry on 11/26/2021.  Plavix held.  Patient placed empirically on IV antibiotics of Unasyn.  Podiatry following.  Patient noted to have had a prior COVID infection on 10/16/2021 and treated in the outpatient setting.   Assessment & Plan:   Principal Problem:   Osteomyelitis ([place]) Active Problems:   Foot osteomyelitis, right ([place])   Hypertension   COVID-19 virus infection: RECENT PSOTIVE TEST 10/16/2021   PVD (peripheral vascular disease) ([place])   Rash   Acute urinary retention  #1 osteomyelitis of the left foot/left foot infected decubitus ulcer -Patient admitted after failing outpatient treatment. -Patient noted to have finished a full course of doxycycline and started on Flagyl 2 days prior to admission. -Patient admitted and evaluated for podiatry and patient scheduled for amputation of the left great toe and partial ray resection on 11/26/2021. -Podiatry not recommending any further imaging as clear  finding noted on plain films of the foot for osteomyelitis. -Plavix on hold. -Patient initially was to be placed on IV vancomycin, cefepime and Flagyl however while patient receiving IV vancomycin there was some concern for rash in the upper extremity. -Admitting physician discussed case with ID attending, Dr.Ravishanker who had recommended IV Unasyn to cover for Enterococcus species. -Patient noted to have developed rash while on IV Unasyn and as such this has been discontinued. -Patient status post amputation left hallux with partial ray resection by podiatry, Dr. Cleda Mccreedy 11/26/2020 -Curbsided ID on-call who recommended initiation of IV daptomycin, IV cefepime, oral Flagyl pending bone cultures postoperatively and until patient able to be assessed formally by ID. -Patient formally seen by ID on 11/27/2021. -Patient with worsening rash and as such ID discontinued cefepime and patient placed on calamine lotion. -Rash improving. -ID/podiatry following and appreciate input and recommendations.  2.  Peripheral vascular disease with recent left SFA stenting -Continue aspirin, statin, Plavix.  3.  Insulin-dependent diabetes mellitus with episodic hypoglycemia -Metformin discontinued.   -Hemoglobin A1c 6.7. -CBG 101 this morning.   -SSI.   4.  Hypertension -Continue home regimen lisinopril.  5.  COVID-19 virus infection (10/16/2021) -Patient noted to have had COVID-19 virus infection 10/16/2021 treated in the outpatient setting and currently asymptomatic and not hypoxic. -No further treatment needed at this time. -No isolation needed. -Continue vitamin C, zinc.   6.  Rash -Likely drug-induced rash. -Patient noted to have a macular rash bilateral upper extremities, upper torso, upper back in between IV antibiotics of Unasyn. -Patient noted to have a similar reaction on presentation after a dose of IV vancomycin. -Patient placed on Benadryl, Pepcid.  IV Solu-Medrol 40 mg p.o. x1. -Discontinued IV  Unasyn. -Placed on IV daptomycin, IV cefepime, oral Flagyl after discussion with Dr. Juleen China of ID until patient can be seen in formal consultation 11/27/2021. -Patient with diffuse rash now with rash in the groin region. -Rash has improved significantly over the past 24 hours with use of calamine lotion and discontinuation of IV cefepime per ID. -ID following and appreciate their input and recommendations. -Decrease Benadryl to daily, continue Pepcid daily.  7.  Urinary retention ruled out -Concern for urinary retention 11/26/2021  -I and o cath x1 ordered however patient went to bedside commode and noted to have good urine output with minimal PVR. -Follow.  8.  Confusion -Patient noted with confusion the morning of 11/27/2021 per RN. -Patient with no focal neurological deficits. -??  Secondary to steroids. -Improved clinically over the past 24 hours. -Continue home regimen Plavix, supportive care.   DVT prophylaxis: Lovenox Code Status: Full Family Communication: Updated patient, daughter at bedside. Disposition:   Status is: Inpatient Remains inpatient appropriate because: Severity of illness           Consultants:  Podiatry: Dr.Cline 11/24/2021 ID consultation: Dr. Ramon Dredge 11/27/2021  Procedures:  Lower extremity Dopplers 11/24/2021 Amputation left hallux with partial ray resection (11/26/2021)  Antimicrobials:  IV Unasyn 11/24/2021>>>> 11/26/2021 IV daptomycin 11/26/2021>>> IV cefepime 11/26/2021>>>> 11/27/2021 Oral Flagyl 11/26/2021>>>.   Subjective: Sitting up in bed alert and oriented.  Confusion improved.  No chest pain.  No shortness of breath.  Rash improving.  Daughter at bedside.   Objective: Vitals:   11/27/21 0426 11/27/21 2126 11/28/21 0448 11/28/21 0752  BP: 116/79 (!) 139/54 (!) 143/52 (!) 127/49  Pulse: 68 60 70 (!) 59  Resp: 20 18 16 16   Temp: 98.3 F (36.8 C) 98.3 F (36.8 C) 97.7 F (36.5 C) 98.2 F (36.8 C)  TempSrc:      SpO2: 98% 98%  98% 98%    Intake/Output Summary (Last 24 hours) at 11/28/2021 1121 Last data filed at 11/27/2021 1900 Gross per 24 hour  Intake 340 ml  Output --  Net 340 ml    There were no vitals filed for this visit.  Examination:  General exam: NAD. Respiratory system: CTA B.  No wheezes, no crackles, no rhonchi.  Normal respiratory effort.  Speaking in full sentences.    Cardiovascular system: Regular rate rhythm no murmurs rubs or gallops.  No JVD.  No lower extremity edema.  Gastrointestinal system: Abdomen is soft, nontender, nondistended, positive bowel sounds.  No rebound.  No guarding.  Central nervous system: Alert and oriented. No focal neurological deficits. Extremities: Left foot in postop bandage.   Skin: Improving diffuse macular rash on bilateral upper extremities, upper torso, groin region, less rash on upper back which is somewhat blanchable.  Psychiatry: Judgement and insight appear normal. Mood & affect appropriate.      Data Reviewed: I have personally reviewed following labs and imaging studies  CBC: Recent Labs  Lab 11/24/21 1039 11/25/21 0503 11/26/21 0600 11/27/21 0510 11/28/21 0820  WBC 11.2* 10.1 16.8* 15.8* 13.2*  NEUTROABS 7.3  --  13.3* 13.6* 8.7*  HGB 12.3 10.4* 12.7 10.2* 10.0*  HCT 39.8 33.0* 40.6 32.7* 31.9*  MCV 90.5 88.5 89.2 88.9 89.9  PLT 353 289 307 287 270     Basic Metabolic Panel: Recent Labs  Lab 11/24/21 1039 11/25/21 0503 11/26/21 0600 11/27/21 0510 11/28/21 0820  NA 138 138 138 137 138  K 4.7 4.9 4.7 4.4  4.4  CL 108 110 108 111 114*  CO2 24 22 22 22  20*  GLUCOSE 156* 110* 149* 214* 99  BUN 26* 22 20 24* 23  CREATININE 1.14* 1.08* 1.14* 1.24* 1.11*  CALCIUM 9.1 8.5* 8.4* 8.2* 8.0*  MG  --   --   --   --  1.8     GFR: Estimated Creatinine Clearance: 35.3 mL/min (A) (by C-G formula based on SCr of 1.11 mg/dL (H)).  Liver Function Tests: Recent Labs  Lab 11/24/21 1039  AST 17  ALT 12  ALKPHOS 130*  BILITOT 0.5   PROT 6.7  ALBUMIN 3.2*     CBG: Recent Labs  Lab 11/27/21 1631 11/27/21 2040 11/27/21 2318 11/28/21 0401 11/28/21 0754  GLUCAP 208* 248* 191* 112* 101*      Recent Results (from the past 240 hour(s))  Resp Panel by RT-PCR (Flu A&B, Covid) Nasopharyngeal Swab     Status: Abnormal   Collection Time: 11/24/21 11:57 AM   Specimen: Nasopharyngeal Swab; Nasopharyngeal(NP) swabs in vial transport medium  Result Value Ref Range Status   SARS Coronavirus 2 by RT PCR POSITIVE (A) NEGATIVE Final    Comment: (NOTE) SARS-CoV-2 target nucleic acids are DETECTED.  The SARS-CoV-2 RNA is generally detectable in upper respiratory specimens during the acute phase of infection. Positive results are indicative of the presence of the identified virus, but do not rule out bacterial infection or co-infection with other pathogens not detected by the test. Clinical correlation with patient history and other diagnostic information is necessary to determine patient infection status. The expected result is Negative.  Fact Sheet for Patients: EntrepreneurPulse.com.au  Fact Sheet for Healthcare Providers: IncredibleEmployment.be  This test is not yet approved or cleared by the [place] FDA and  has been authorized for detection and/or diagnosis of SARS-CoV-2 by FDA under an Emergency Use Authorization (EUA).  This EUA will remain in effect (meaning this test can be used) for the duration of  the COVID-19 declaration under Section 564(b)(1) of the A ct, 21 U.S.C. section 360bbb-3(b)(1), unless the authorization is terminated or revoked sooner.     Influenza A by PCR NEGATIVE NEGATIVE Final   Influenza B by PCR NEGATIVE NEGATIVE Final    Comment: (NOTE) The Xpert Xpress SARS-CoV-2/FLU/RSV plus assay is intended as an aid in the diagnosis of influenza from Nasopharyngeal swab specimens and should not be used as a sole basis for treatment. Nasal washings  and aspirates are unacceptable for Xpert Xpress SARS-CoV-2/FLU/RSV testing.  Fact Sheet for Patients: EntrepreneurPulse.com.au  Fact Sheet for Healthcare Providers: IncredibleEmployment.be  This test is not yet approved or cleared by the [place] FDA and has been authorized for detection and/or diagnosis of SARS-CoV-2 by FDA under an Emergency Use Authorization (EUA). This EUA will remain in effect (meaning this test can be used) for the duration of the COVID-19 declaration under Section 564(b)(1) of the Act, 21 U.S.C. section 360bbb-3(b)(1), unless the authorization is terminated or revoked.  Performed at [facility], [address], [place], [place] [address]   Surgical pcr screen     Status: None   Collection Time: 11/24/21  4:59 PM   Specimen: Nasal Mucosa; Nasal Swab  Result Value Ref Range Status   MRSA, PCR NEGATIVE NEGATIVE Final   Staphylococcus aureus NEGATIVE NEGATIVE Final    Comment: (NOTE) The Xpert SA Assay (FDA approved for NASAL specimens in patients 86 years of age and older), is one component of a comprehensive surveillance program. It  is not intended to diagnose infection nor to guide or monitor treatment. Performed at [facility], [address], [place], [place] [address]   Aerobic/Anaerobic Culture w Gram Stain (surgical/deep wound)     Status: None (Preliminary result)   Collection Time: 11/26/21  9:17 AM   Specimen: PATH GI Other; Tissue  Result Value Ref Range Status   Specimen Description   Final    TISSUE Performed at [facility], [place] [address], [place], [place] [address]    Special Requests   Final    NONE Performed at [facility], [place], [place] [address]    Gram Stain NO WBC SEEN NO ORGANISMS SEEN   Final   Culture   Final    NO GROWTH 2 DAYS Performed at [facility], [place] [address] [place],  [address]    Report Status  PENDING  Incomplete          Radiology Studies: No results found.      Scheduled Meds:  vitamin C  500 mg Oral BID   aspirin EC  81 mg Oral Daily   atorvastatin  10 mg Oral QPM   calamine  1 application Topical BID   clopidogrel  75 mg Oral Daily   [START ON 11/29/2021] diphenhydrAMINE  25 mg Oral Daily   enoxaparin (LOVENOX) injection  40 mg Subcutaneous Q24H   famotidine  20 mg Oral Daily   insulin aspart  0-9 Units Subcutaneous Q4H   lisinopril  20 mg Oral Daily   metroNIDAZOLE  500 mg Oral BID   zinc sulfate  220 mg Oral Daily   Continuous Infusions:  sodium chloride 10 mL/hr at 11/26/21 0413   sodium chloride 75 mL/hr at 11/27/21 2046   DAPTOmycin (CUBICIN)  IV 400 mg (11/27/21 2059)     LOS: 4 days    Time spent: 35 minutes    Irine Seal, MD [facility]   To contact the attending provider between 7A-7P or the covering provider during after hours 7P-7A, please log into the web site [email or web address] and access using universal [place] password for that web site. If you do not have the password, please call the hospital operator.  11/28/2021, 11:21 AM

## 2021-11-28 NOTE — Evaluation (Addendum)
Physical Therapy Evaluation Patient Details Name: Beth Mcguire MRN: [id number] DOB: 1941-06-09 Today's Date: 11/28/2021  History of Present Illness  81 y.o. female who presents s/p L foot amputation due to ulcer on the left toe that is exposing bone w/ X-Ray concerning for osteomyelitis. L hallux w/ partial ray resection performed. PmHx: DM, PVD, HTN, HLD, CKD, Colon Cancer, Anxiety.   Clinical Impression  Pt is awake and resting in bed w/ family (son & daughter) at bedside upon PT entrance into room for evaluation today. Pt is A&Ox4 and reports no c/o pain at rest. Pt lives in [place] home w/ her daughter and ramp entrance. Daughter reports she helps w/ ADLs PRN. Pt reports she does not use an AD at home.  Pt was able to perform bed mobility w/ SUPERVISION to sit at EOB. Once seated EOB, patient was able to don OrthoWedge shoe w/ some education and assistance from PT, as well as briefs prior to standing/ambulation. She is able to stand w/ CGA using RW, but requires verbal cues for proper UE placement for efficiency and safety. Once standing she is able to use both UE to pull briefs up and is able to progress to ambulating ~75ft w/ CGA and RW; with occasional verbal cues about weight bearing through L heel. While returning to room, Pt reports she needs to use the bathroom and is able to perform pericare needs w/ B UE and no reliance on RW for balance. Pt left in bed w/ family at bedside and all needs within reach. Pt will benefit from continued skilled PT in order to improve strength/endurance, balance, mobility, gait, and restore PLOF. Discharge to home w/ intermittent supervision/assistance, with rehab recommendation to follow physician's recommendations due to wound healing/precautions and the level of assistance required by the patient to ensure safety and improve overall function.       Recommendations for follow up therapy are one component of a multi-disciplinary discharge planning process,  led by the attending physician.  Recommendations may be updated based on patient status, additional functional criteria and insurance authorization.  Follow Up Recommendations Follow physician's recommendations for discharge plan and follow up therapies    Assistance Recommended at Discharge Intermittent Supervision/Assistance  Patient can return home with the following  A little help with walking and/or transfers;A little help with bathing/dressing/bathroom;Assistance with cooking/housework;Help with stairs or ramp for entrance;Assist for transportation    Equipment Recommendations Rolling walker (2 wheels)  Recommendations for Other Services       Functional Status Assessment Patient has had a recent decline in their functional status and demonstrates the ability to make significant improvements in function in a reasonable and predictable amount of time.     Precautions / Restrictions Precautions Precautions: Fall Restrictions Weight Bearing Restrictions: Yes LLE Weight Bearing: Weight bearing as tolerated (Per nursing orders in chart/OrthoWedge)      Mobility  Bed Mobility Overal bed mobility: Needs Assistance Bed Mobility: Supine to Sit     Supine to sit: Supervision, HOB elevated          Transfers Overall transfer level: Needs assistance Equipment used: Rolling walker (2 wheels) Transfers: Sit to/from Stand, Bed to chair/wheelchair/BSC Sit to Stand: Min guard   Step pivot transfers: Min guard            Ambulation/Gait Ambulation/Gait assistance: Min guard Gait Distance (Feet): 35 Feet Assistive device: Rolling walker (2 wheels) Gait Pattern/deviations: Step-to pattern, Decreased step length - right, Decreased step length - left, Decreased stride length  Gait velocity: decreased     General Gait Details: verbal cues about maintain OrthoWedge precautions  Stairs            Wheelchair Mobility    Modified Rankin (Stroke Patients Only)        Balance Overall balance assessment: Needs assistance Sitting-balance support: Feet supported, Bilateral upper extremity supported Sitting balance-Leahy Scale: Good     Standing balance support: During functional activity, Bilateral upper extremity supported, Single extremity supported Standing balance-Leahy Scale: Good Standing balance comment: able to stand up from toilet for pericare needs                             Pertinent Vitals/Pain Pain Assessment Pain Assessment: No/denies pain    Home Living Family/patient expects to be discharged to:: Private residence Living Arrangements: Children Available Help at Discharge: Family;Available 24 hours/day Type of Home: House Home Access: Ramped entrance       Home Layout: One level Home Equipment: Conservation officer, nature (2 wheels);Wheelchair - Brewing technologist - built in      Prior Function Prior Level of Function : Independent/Modified Independent                     Journalist, newspaper        Extremity/Trunk Assessment   Upper Extremity Assessment Upper Extremity Assessment: Overall WFL for tasks assessed;Generalized weakness    Lower Extremity Assessment Lower Extremity Assessment: Overall WFL for tasks assessed;Generalized weakness       Communication      Cognition Arousal/Alertness: Awake/alert Behavior During Therapy: Impulsive Overall Cognitive Status: Within Functional Limits for tasks assessed                                 General Comments: Verbal cueing prior to transfers        General Comments      Exercises     Assessment/Plan    PT Assessment Patient needs continued PT services  PT Problem List Decreased strength;Decreased mobility;Decreased coordination;Decreased activity tolerance;Decreased cognition;Decreased balance;Decreased knowledge of use of DME;Pain;Decreased knowledge of precautions;Decreased safety awareness       PT Treatment Interventions DME  instruction;Therapeutic activities;Gait training;Therapeutic exercise;Stair training;Balance training;Functional mobility training;Neuromuscular re-education    PT Goals (Current goals can be found in the Care Plan section)  Acute Rehab PT Goals Patient Stated Goal: to improve symptoms/strength to go home PT Goal Formulation: With patient Time For Goal Achievement: 12/12/21 Potential to Achieve Goals: Good    Frequency Min 2X/week     Co-evaluation               AM-PAC PT "6 Clicks" Mobility  Outcome Measure Help needed turning from your back to your side while in a flat bed without using bedrails?: None Help needed moving from lying on your back to sitting on the side of a flat bed without using bedrails?: None Help needed moving to and from a bed to a chair (including a wheelchair)?: A Little Help needed standing up from a chair using your arms (e.g., wheelchair or bedside chair)?: A Little Help needed to walk in hospital room?: A Little Help needed climbing 3-5 steps with a railing? : A Lot 6 Click Score: 19    End of Session Equipment Utilized During Treatment: Gait belt Activity Tolerance: Patient tolerated treatment well;Patient limited by fatigue Patient left: in bed;with bed alarm set;with  family/visitor present;with call bell/phone within reach Nurse Communication: Mobility status PT Visit Diagnosis: Unsteadiness on feet (R26.81);Muscle weakness (generalized) (M62.81);Pain Pain - Right/Left: Left Pain - part of body: Ankle and joints of foot    Time: 2706-2376 PT Time Calculation (min) (ACUTE ONLY): 31 min   Charges:             Jonnie Kind, SPT 11/28/2021, 3:10 PM

## 2021-11-29 DIAGNOSIS — M86071 Acute hematogenous osteomyelitis, right ankle and foot: Secondary | ICD-10-CM

## 2021-11-29 DIAGNOSIS — M86172 Other acute osteomyelitis, left ankle and foot: Secondary | ICD-10-CM

## 2021-11-29 LAB — CBC WITH DIFFERENTIAL/PLATELET
Abs Immature Granulocytes: 0.04 10*3/uL (ref 0.00–0.07)
Basophils Absolute: 0.1 10*3/uL (ref 0.0–0.1)
Basophils Relative: 1 %
Eosinophils Absolute: 0.4 10*3/uL (ref 0.0–0.5)
Eosinophils Relative: 5 %
HCT: 30.7 % — ABNORMAL LOW (ref 36.0–46.0)
Hemoglobin: 9.7 g/dL — ABNORMAL LOW (ref 12.0–15.0)
Immature Granulocytes: 0 %
Lymphocytes Relative: 25 %
Lymphs Abs: 2.5 10*3/uL (ref 0.7–4.0)
MCH: 28.3 pg (ref 26.0–34.0)
MCHC: 31.6 g/dL (ref 30.0–36.0)
MCV: 89.5 fL (ref 80.0–100.0)
Monocytes Absolute: 0.6 10*3/uL (ref 0.1–1.0)
Monocytes Relative: 6 %
Neutro Abs: 6.2 10*3/uL (ref 1.7–7.7)
Neutrophils Relative %: 63 %
Platelets: 274 10*3/uL (ref 150–400)
RBC: 3.43 MIL/uL — ABNORMAL LOW (ref 3.87–5.11)
RDW: 15.6 % — ABNORMAL HIGH (ref 11.5–15.5)
WBC: 9.8 10*3/uL (ref 4.0–10.5)
nRBC: 0 % (ref 0.0–0.2)

## 2021-11-29 LAB — BASIC METABOLIC PANEL
Anion gap: 4 — ABNORMAL LOW (ref 5–15)
BUN: 19 mg/dL (ref 8–23)
CO2: 24 mmol/L (ref 22–32)
Calcium: 8.1 mg/dL — ABNORMAL LOW (ref 8.9–10.3)
Chloride: 109 mmol/L (ref 98–111)
Creatinine, Ser: 1.07 mg/dL — ABNORMAL HIGH (ref 0.44–1.00)
GFR, Estimated: 52 mL/min — ABNORMAL LOW (ref >60)
Glucose, Bld: 225 mg/dL — ABNORMAL HIGH (ref 70–99)
Potassium: 4.1 mmol/L (ref 3.5–5.1)
Sodium: 137 mmol/L (ref 135–145)

## 2021-11-29 LAB — GLUCOSE, CAPILLARY
Glucose-Capillary: 118 mg/dL — ABNORMAL HIGH (ref 70–99)
Glucose-Capillary: 126 mg/dL — ABNORMAL HIGH (ref 70–99)
Glucose-Capillary: 219 mg/dL — ABNORMAL HIGH (ref 70–99)
Glucose-Capillary: 282 mg/dL — ABNORMAL HIGH (ref 70–99)

## 2021-11-29 MED ORDER — DOXYCYCLINE HYCLATE 100 MG PO TABS: 100.0000 mg | ORAL_TABLET | Freq: Two times a day (BID) | ORAL | Status: DC

## 2021-11-29 MED ORDER — DOXYCYCLINE HYCLATE 100 MG PO TABS: 100.0000 mg | ORAL_TABLET | Freq: Two times a day (BID) | ORAL | 0 refills | Status: AC

## 2021-11-29 MED ORDER — HYDROCODONE-ACETAMINOPHEN 5-325 MG PO TABS
1.0000 | ORAL_TABLET | Freq: Four times a day (QID) | ORAL | 0 refills | Status: DC | PRN
Start: 2021-11-29 — End: 2022-05-31

## 2021-11-29 NOTE — Progress Notes (Signed)
Patient lives at home with her daughter and has transportation with daughter as well, has a rolling walker at home and a wheelchair, has a shower seat ID continues to follow for ABX needs, rash improving TOC to continue to follow for assisting with DC planning

## 2021-11-29 NOTE — Progress Notes (Signed)
ID Pt doing well No pain Seen with Dr.Cline O/e awake and alert Skin fading macular rash Left foot- surgical site looks well coapted with no discharge or erythema   Labs CBC Latest Ref Rng & Units 11/29/2021 11/28/2021 11/27/2021  WBC 4.0 - 10.5 K/uL 9.8 13.2(H) 15.8(H)  Hemoglobin 12.0 - 15.0 g/dL 9.7(L) 10.0(L) 10.2(L)  Hematocrit 36.0 - 46.0 % 30.7(L) 31.9(L) 32.7(L)  Platelets 150 - 400 K/uL 274 270 287    CMP Latest Ref Rng & Units 11/29/2021 11/28/2021 11/27/2021  Glucose 70 - 99 mg/dL 225(H) 99 214(H)  BUN 8 - 23 mg/dL 19 23 24(H)  Creatinine 0.44 - 1.00 mg/dL 1.07(H) 1.11(H) 1.24(H)  Sodium 135 - 145 mmol/L 137 138 137  Potassium 3.5 - 5.1 mmol/L 4.1 4.4 4.4  Chloride 98 - 111 mmol/L 109 114(H) 111  CO2 22 - 32 mmol/L 24 20(L) 22  Calcium 8.9 - 10.3 mg/dL 8.1(L) 8.0(L) 8.2(L)  Total Protein 6.5 - 8.1 g/dL - - -  Total Bilirubin 0.3 - 1.2 mg/dL - - -  Alkaline Phos 38 - 126 U/L - - -  AST 15 - 41 U/L - - -  ALT 0 - 44 U/L - - -   Pathology ACUTE OSTEOMYELITIS.  - SKIN AND SOFT TISSUE WITH ULCER, ABSCESS, AND GRANULATION TISSUE.  - RESECTION MARGINS APPEAR NEGATIVE FOR ACTIVE INFLAMMATION.   Micro Culture ng  Impression/recommendation Foot infection with ulcer and acute osteomyelitis of the left first toe- s/p ray excision Curative amputation Culture neg Will give Doxy 100mg  PO BID for a week  Discussed the management with patient and daughter and Dr.Cline

## 2021-11-29 NOTE — Progress Notes (Signed)
Patient and daughter was given verbal and written discharge instructions, acknowledge understanding and states she will comply with medication and appointments. Patient was taken to car by wheel chair, no distress noted when leaving the floor.

## 2021-11-29 NOTE — Care Management Important Message (Signed)
Important Message  Patient Details  Name: Beth Mcguire MRN: [id number] Date of Birth: Nov 21, 1940   Medicare Important Message Given:  Yes     Juliann Pulse A Domnique Vantine 11/29/2021, 3:15 PM

## 2021-11-29 NOTE — Discharge Summary (Signed)
Physician Discharge Summary   Patient: Beth Mcguire MRN: [id number] DOB: Oct 08, 1941  Admit date:     11/24/2021  Discharge date: 11/29/21  Discharge Physician: Sharen Hones   PCP: [name], MD   Recommendations at discharge:  Follow-up with PCP in 1 week. Follow-up with podiatry in 2 weeks. Complete antibiotics.  Discharge Diagnoses: Principal Problem:   Osteomyelitis ([place]) Active Problems:   Foot osteomyelitis, right ([place])   Hypertension   COVID-19 virus infection: RECENT PSOTIVE TEST 10/16/2021   PVD (peripheral vascular disease) ([place])   Rash   Acute urinary retention  Resolved Problems:   * No resolved hospital problems. 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  Patient noted to have had recent revascularization by vascular surgery a few weeks ago.  Patient noted to have completed 10-day course of doxycycline in the outpatient setting and started on Flagyl 2 days prior to admission.  Patient evaluated by podiatry on day of admission noted to have exposed bone and joint and admitted for osteomyelitis with exposed first metatarsal phalangeal joint of the left foot and admitted for amputation of the left great toe with partial ray resection to be done by podiatry on 11/26/2021.  Plavix held.  Patient placed empirically on IV antibiotics of Unasyn     Assessment and Plan:  osteomyelitis of the left foot/left foot infected decubitus ulcer Status post first ray resection on 2/19. Tissue culture so far has no growth. Has been evaluated by ID, it appears that patient had a therapeutic amputation, will continue daptomycin and Flagyl. Planning to discharge patient tomorrow. Discussed  with ID, will change to doxycycline for 7 days at time of discharge.   Peripheral vascular disease with recent left SFA stenting Continue home medicine with aspirin, statin and Plavix.  Insulin-dependent diabetes mellitus with episodic hypoglycemia Resume home regimen.  Drug rash. Appear to be secondary to vancomycin as well as Unasyn. Condition had improved.  Recent COVID infection.  Essential hypertension.         Consultants: Podiatry and ID. Procedures performed: First ray resection Disposition: Home Diet recommendation:  Discharge Diet Orders (From admission, onward)     Start     Ordered   11/29/21 0000  Diet - low sodium heart healthy        11/29/21 1446           Carb modified diet  DISCHARGE MEDICATION: Allergies as of 11/29/2021       Reactions   Amoxicillin-pot Clavulanate Rash        Medication List     STOP taking these medications    metroNIDAZOLE 500 MG tablet Commonly known as: FLAGYL   QUEtiapine 25 MG tablet Commonly known as: SEROQUEL       TAKE these medications    aspirin 81 MG tablet Take 1 tablet (81 mg total) by mouth daily.   atorvastatin 10 MG tablet Commonly known as: LIPITOR Take 10 mg every evening by mouth.   B-D ULTRAFINE III SHORT PEN 31G X 8 MM Misc Generic drug: Insulin Pen Needle   clopidogrel 75 MG tablet Commonly known as: PLAVIX TAKE 1 TABLET BY MOUTH EVERY DAY   doxycycline 100 MG tablet Commonly known as: VIBRA-TABS Take 1 tablet (100 mg total) by mouth every 12 (twelve)  hours for 7 days.   glucose monitoring kit monitoring kit 1 each by Does not apply route 4 (four) times daily -  before meals and at bedtime.   ONE TOUCH ULTRA MINI w/Device Kit   HYDROcodone-acetaminophen 5-325 MG tablet Commonly known as: NORCO/VICODIN Take 1 tablet by mouth every 6 (six) hours as needed for moderate pain.   lisinopril 20 MG tablet Commonly known as: ZESTRIL Take 20 mg by mouth daily.   metFORMIN  500 MG 24 hr tablet Commonly known as: GLUCOPHAGE-XR Take 500 mg by mouth daily with supper.   NovoLOG Mix 70/30 FlexPen (70-30) 100 UNIT/ML FlexPen Generic drug: insulin aspart protamine - aspart Inject 25 Units into the skin 2 (two) times daily before a meal.   ONE TOUCH ULTRA TEST test strip Generic drug: glucose blood   OneTouch Delica Lancets 70J Misc               Discharge Care Instructions  (From admission, onward)           Start     Ordered   11/29/21 0000  Discharge wound care:       Comments: Follow with podiatry   11/29/21 1446            Follow-up Information     [name], MD Follow up in 1 week(s).   Specialty: Family Medicine Contact information: [address] [facility] [place] [address] [phone]         Sharlotte Alamo, DPM Follow up in 2 week(s).   Specialty: Podiatry Contact information: [place] [address] [phone]                 Discharge Exam: Filed Weights   11/29/21 0800  Weight: 67 kg   General exam: Appears calm and comfortable  Respiratory system: Clear to auscultation. Respiratory effort normal. Cardiovascular system: S1 & S2 heard, RRR. No JVD, murmurs, rubs, gallops or clicks. No pedal edema. Gastrointestinal system: Abdomen is nondistended, soft and nontender. No organomegaly or masses felt. Normal bowel sounds heard. Central nervous system: Alert and oriented. No focal neurological deficits. Extremities: Symmetric 5 x 5 power. Skin: No rashes, lesions or ulcers Psychiatry: Judgement and insight appear normal. Mood & affect appropriate.    Condition at discharge: good  The results of significant diagnostics from this hospitalization (including imaging, microbiology, ancillary and laboratory) are listed below for reference.   Imaging Studies: PERIPHERAL VASCULAR CATHETERIZATION  Result Date: 11/06/2021 See surgical note  for result.  US Venous Img Lower Bilateral (DVT)  Result Date: 11/24/2021 CLINICAL DATA:  Bilateral lower extremity edema. EXAM: BILATERAL LOWER EXTREMITY VENOUS DOPPLER ULTRASOUND TECHNIQUE: Gray-scale sonography with graded compression, as well as color Doppler and duplex ultrasound were performed to evaluate the lower extremity deep venous systems from the level of the common femoral vein and including the common femoral, femoral, profunda femoral, popliteal and calf veins including the posterior tibial, peroneal and gastrocnemius veins when visible. The superficial great saphenous vein was also interrogated. Spectral Doppler was utilized to evaluate flow at rest and with distal augmentation maneuvers in the common femoral, femoral and popliteal veins. COMPARISON:  None. FINDINGS: RIGHT LOWER EXTREMITY Common Femoral Vein: No evidence of thrombus. Normal compressibility, respiratory phasicity and response to augmentation. Saphenofemoral Junction: No evidence of thrombus. Normal compressibility and flow on color Doppler imaging. Profunda Femoral Vein: No evidence of thrombus. Normal compressibility and flow on color Doppler imaging. Femoral  Vein: No evidence of thrombus. Normal compressibility, respiratory phasicity and response to augmentation. Popliteal Vein: No evidence of thrombus. Normal compressibility, respiratory phasicity and response to augmentation. Calf Veins: No evidence of thrombus. Normal compressibility and flow on color Doppler imaging. Superficial Great Saphenous Vein: No evidence of thrombus. Normal compressibility. Venous Reflux:  None. Other Findings: No evidence of superficial thrombophlebitis or abnormal fluid collection. LEFT LOWER EXTREMITY Common Femoral Vein: No evidence of thrombus. Normal compressibility, respiratory phasicity and response to augmentation. Saphenofemoral Junction: No evidence of thrombus. Normal compressibility and flow on color Doppler imaging. Profunda Femoral  Vein: No evidence of thrombus. Normal compressibility and flow on color Doppler imaging. Femoral Vein: No evidence of thrombus. Normal compressibility, respiratory phasicity and response to augmentation. Popliteal Vein: No evidence of thrombus. Normal compressibility, respiratory phasicity and response to augmentation. Calf Veins: No evidence of thrombus. Normal compressibility and flow on color Doppler imaging. Superficial Great Saphenous Vein: No evidence of thrombus. Normal compressibility. Venous Reflux:  None. Other Findings: No evidence of superficial thrombophlebitis or abnormal fluid collection. IMPRESSION: No evidence of deep venous thrombosis in either lower extremity. Electronically Signed   By: Aletta Edouard M.D.   On: 11/24/2021 16:25    Microbiology: Results for orders placed or performed during the hospital encounter of 11/24/21  Resp Panel by RT-PCR (Flu A&B, Covid) Nasopharyngeal Swab     Status: Abnormal   Collection Time: 11/24/21 11:57 AM   Specimen: Nasopharyngeal Swab; Nasopharyngeal(NP) swabs in vial transport medium  Result Value Ref Range Status   SARS Coronavirus 2 by RT PCR POSITIVE (A) NEGATIVE Final    Comment: (NOTE) SARS-CoV-2 target nucleic acids are DETECTED.  The SARS-CoV-2 RNA is generally detectable in upper respiratory specimens during the acute phase of infection. Positive results are indicative of the presence of the identified virus, but do not rule out bacterial infection or co-infection with other pathogens not detected by the test. Clinical correlation with patient history and other diagnostic information is necessary to determine patient infection status. The expected result is Negative.  Fact Sheet for Patients: EntrepreneurPulse.com.au  Fact Sheet for Healthcare Providers: IncredibleEmployment.be  This test is not yet approved or cleared by the [place] FDA and  has been authorized for detection and/or  diagnosis of SARS-CoV-2 by FDA under an Emergency Use Authorization (EUA).  This EUA will remain in effect (meaning this test can be used) for the duration of  the COVID-19 declaration under Section 564(b)(1) of the A ct, 21 U.S.C. section 360bbb-3(b)(1), unless the authorization is terminated or revoked sooner.     Influenza A by PCR NEGATIVE NEGATIVE Final   Influenza B by PCR NEGATIVE NEGATIVE Final    Comment: (NOTE) The Xpert Xpress SARS-CoV-2/FLU/RSV plus assay is intended as an aid in the diagnosis of influenza from Nasopharyngeal swab specimens and should not be used as a sole basis for treatment. Nasal washings and aspirates are unacceptable for Xpert Xpress SARS-CoV-2/FLU/RSV testing.  Fact Sheet for Patients: EntrepreneurPulse.com.au  Fact Sheet for Healthcare Providers: IncredibleEmployment.be  This test is not yet approved or cleared by the [place] FDA and has been authorized for detection and/or diagnosis of SARS-CoV-2 by FDA under an Emergency Use Authorization (EUA). This EUA will remain in effect (meaning this test can be used) for the duration of the COVID-19 declaration under Section 564(b)(1) of the Act, 21 U.S.C. section 360bbb-3(b)(1), unless the authorization is terminated or revoked.  Performed at [facility], [address], [place], [place] [address]  Surgical pcr screen     Status: None   Collection Time: 11/24/21  4:59 PM   Specimen: Nasal Mucosa; Nasal Swab  Result Value Ref Range Status   MRSA, PCR NEGATIVE NEGATIVE Final   Staphylococcus aureus NEGATIVE NEGATIVE Final    Comment: (NOTE) The Xpert SA Assay (FDA approved for NASAL specimens in patients 72 years of age and older), is one component of a comprehensive surveillance program. It is not intended to diagnose infection nor to guide or monitor treatment. Performed at [facility], [address] [place], [place]  [address]   Aerobic/Anaerobic Culture w Gram Stain (surgical/deep wound)     Status: None (Preliminary result)   Collection Time: 11/26/21  9:17 AM   Specimen: PATH GI Other; Tissue  Result Value Ref Range Status   Specimen Description   Final    TISSUE Performed at [facility], [place] [address], [place], [place] [address]    Special Requests   Final    NONE Performed at [facility], [place], [place] [address]    Gram Stain NO WBC SEEN NO ORGANISMS SEEN   Final   Culture   Final    NO GROWTH 3 DAYS NO ANAEROBES ISOLATED; CULTURE IN PROGRESS FOR 5 DAYS Performed at [facility], [address], [place], [place] [address]    Report Status PENDING  Incomplete    Labs: CBC: Recent Labs  Lab 11/24/21 1039 11/25/21 0503 11/26/21 0600 11/27/21 0510 11/28/21 0820 11/29/21 0437  WBC 11.2* 10.1 16.8* 15.8* 13.2* 9.8  NEUTROABS 7.3  --  13.3* 13.6* 8.7* 6.2  HGB 12.3 10.4* 12.7 10.2* 10.0* 9.7*  HCT 39.8 33.0* 40.6 32.7* 31.9* 30.7*  MCV 90.5 88.5 89.2 88.9 89.9 89.5  PLT 353 289 307 287 270 797   Basic Metabolic Panel: Recent Labs  Lab 11/25/21 0503 11/26/21 0600 11/27/21 0510 11/28/21 0820 11/29/21 0437  NA 138 138 137 138 137  K 4.9 4.7 4.4 4.4 4.1  CL 110 108 111 114* 109  CO2 '22 22 22 ' 20* 24  GLUCOSE 110* 149* 214* 99 225*  BUN 22 20 24* 23 19  CREATININE 1.08* 1.14* 1.24* 1.11* 1.07*  CALCIUM 8.5* 8.4* 8.2* 8.0* 8.1*  MG  --   --   --  1.8  --    Liver Function Tests: Recent Labs  Lab 11/24/21 1039  AST 17  ALT 12  ALKPHOS 130*  BILITOT 0.5  PROT 6.7  ALBUMIN 3.2*   CBG: Recent Labs  Lab 11/28/21 2043 11/29/21 0026 11/29/21 0410 11/29/21 0821 11/29/21 1132  GLUCAP 338* 282* 219* 126* 118*    Discharge time spent: greater than 30 minutes.  Signed: Sharen Hones, MD [facility] 11/29/2021

## 2021-12-01 LAB — AEROBIC/ANAEROBIC CULTURE W GRAM STAIN (SURGICAL/DEEP WOUND)
Culture: NO GROWTH
Gram Stain: NONE SEEN

## 2021-12-07 ENCOUNTER — Other Ambulatory Visit (INDEPENDENT_AMBULATORY_CARE_PROVIDER_SITE_OTHER): Payer: Self-pay | Admitting: Nurse Practitioner

## 2021-12-07 DIAGNOSIS — I739 Peripheral vascular disease, unspecified: Secondary | ICD-10-CM

## 2021-12-07 DIAGNOSIS — L97529 Non-pressure chronic ulcer of other part of left foot with unspecified severity: Secondary | ICD-10-CM

## 2021-12-08 ENCOUNTER — Other Ambulatory Visit: Payer: Self-pay

## 2021-12-08 ENCOUNTER — Ambulatory Visit (INDEPENDENT_AMBULATORY_CARE_PROVIDER_SITE_OTHER): Payer: Medicare HMO | Admitting: Vascular Surgery

## 2021-12-08 ENCOUNTER — Ambulatory Visit (INDEPENDENT_AMBULATORY_CARE_PROVIDER_SITE_OTHER): Payer: Medicare HMO

## 2021-12-08 DIAGNOSIS — I739 Peripheral vascular disease, unspecified: Secondary | ICD-10-CM

## 2021-12-08 DIAGNOSIS — L97529 Non-pressure chronic ulcer of other part of left foot with unspecified severity: Secondary | ICD-10-CM

## 2021-12-19 ENCOUNTER — Encounter (INDEPENDENT_AMBULATORY_CARE_PROVIDER_SITE_OTHER): Payer: Self-pay | Admitting: *Deleted

## 2022-02-09 ENCOUNTER — Inpatient Hospital Stay
Admission: EM | Admit: 2022-02-09 | Discharge: 2022-02-13 | DRG: 617 | Disposition: A | Discharge status: Home Health | Payer: Medicare HMO | Source: Ambulatory Visit | Attending: Internal Medicine | Admitting: Internal Medicine

## 2022-02-09 ENCOUNTER — Other Ambulatory Visit: Payer: Self-pay

## 2022-02-09 ENCOUNTER — Emergency Department: Payer: Medicare HMO

## 2022-02-09 DIAGNOSIS — E785 Hyperlipidemia, unspecified: Secondary | ICD-10-CM | POA: Diagnosis present

## 2022-02-09 DIAGNOSIS — N1831 Chronic kidney disease, stage 3a: Secondary | ICD-10-CM | POA: Diagnosis present

## 2022-02-09 DIAGNOSIS — M86172 Other acute osteomyelitis, left ankle and foot: Secondary | ICD-10-CM

## 2022-02-09 DIAGNOSIS — E1122 Type 2 diabetes mellitus with diabetic chronic kidney disease: Secondary | ICD-10-CM | POA: Diagnosis present

## 2022-02-09 DIAGNOSIS — Z7984 Long term (current) use of oral hypoglycemic drugs: Secondary | ICD-10-CM

## 2022-02-09 DIAGNOSIS — I739 Peripheral vascular disease, unspecified: Secondary | ICD-10-CM | POA: Diagnosis not present

## 2022-02-09 DIAGNOSIS — Z7982 Long term (current) use of aspirin: Secondary | ICD-10-CM

## 2022-02-09 DIAGNOSIS — E1151 Type 2 diabetes mellitus with diabetic peripheral angiopathy without gangrene: Secondary | ICD-10-CM | POA: Diagnosis present

## 2022-02-09 DIAGNOSIS — E11628 Type 2 diabetes mellitus with other skin complications: Secondary | ICD-10-CM | POA: Diagnosis present

## 2022-02-09 DIAGNOSIS — E1140 Type 2 diabetes mellitus with diabetic neuropathy, unspecified: Secondary | ICD-10-CM | POA: Diagnosis present

## 2022-02-09 DIAGNOSIS — E1169 Type 2 diabetes mellitus with other specified complication: Principal | ICD-10-CM

## 2022-02-09 DIAGNOSIS — Z79899 Other long term (current) drug therapy: Secondary | ICD-10-CM

## 2022-02-09 DIAGNOSIS — Z89412 Acquired absence of left great toe: Secondary | ICD-10-CM

## 2022-02-09 DIAGNOSIS — Z794 Long term (current) use of insulin: Secondary | ICD-10-CM

## 2022-02-09 DIAGNOSIS — E11621 Type 2 diabetes mellitus with foot ulcer: Secondary | ICD-10-CM | POA: Diagnosis present

## 2022-02-09 DIAGNOSIS — Z833 Family history of diabetes mellitus: Secondary | ICD-10-CM

## 2022-02-09 DIAGNOSIS — Z89411 Acquired absence of right great toe: Secondary | ICD-10-CM

## 2022-02-09 DIAGNOSIS — Z85038 Personal history of other malignant neoplasm of large intestine: Secondary | ICD-10-CM

## 2022-02-09 DIAGNOSIS — I129 Hypertensive chronic kidney disease with stage 1 through stage 4 chronic kidney disease, or unspecified chronic kidney disease: Secondary | ICD-10-CM | POA: Diagnosis present

## 2022-02-09 DIAGNOSIS — L089 Local infection of the skin and subcutaneous tissue, unspecified: Secondary | ICD-10-CM

## 2022-02-09 DIAGNOSIS — Z8616 Personal history of COVID-19: Secondary | ICD-10-CM

## 2022-02-09 DIAGNOSIS — F0390 Unspecified dementia without behavioral disturbance: Secondary | ICD-10-CM | POA: Diagnosis present

## 2022-02-09 DIAGNOSIS — M869 Osteomyelitis, unspecified: Secondary | ICD-10-CM | POA: Diagnosis present

## 2022-02-09 DIAGNOSIS — L97529 Non-pressure chronic ulcer of other part of left foot with unspecified severity: Secondary | ICD-10-CM | POA: Diagnosis present

## 2022-02-09 DIAGNOSIS — Z7902 Long term (current) use of antithrombotics/antiplatelets: Secondary | ICD-10-CM

## 2022-02-09 LAB — CBC WITH DIFFERENTIAL/PLATELET
Abs Immature Granulocytes: 0.05 10*3/uL (ref 0.00–0.07)
Basophils Absolute: 0.1 10*3/uL (ref 0.0–0.1)
Basophils Relative: 1 %
Eosinophils Absolute: 0.3 10*3/uL (ref 0.0–0.5)
Eosinophils Relative: 3 %
HCT: 36.2 % (ref 36.0–46.0)
Hemoglobin: 11.2 g/dL — ABNORMAL LOW (ref 12.0–15.0)
Immature Granulocytes: 1 %
Lymphocytes Relative: 27 %
Lymphs Abs: 2.4 10*3/uL (ref 0.7–4.0)
MCH: 28.2 pg (ref 26.0–34.0)
MCHC: 30.9 g/dL (ref 30.0–36.0)
MCV: 91.2 fL (ref 80.0–100.0)
Monocytes Absolute: 0.5 10*3/uL (ref 0.1–1.0)
Monocytes Relative: 5 %
Neutro Abs: 5.8 10*3/uL (ref 1.7–7.7)
Neutrophils Relative %: 63 %
Platelets: 350 10*3/uL (ref 150–400)
RBC: 3.97 MIL/uL (ref 3.87–5.11)
RDW: 14 % (ref 11.5–15.5)
WBC: 9.1 10*3/uL (ref 4.0–10.5)
nRBC: 0 % (ref 0.0–0.2)

## 2022-02-09 LAB — COMPREHENSIVE METABOLIC PANEL
ALT: 13 U/L (ref 0–44)
AST: 13 U/L — ABNORMAL LOW (ref 15–41)
Albumin: 3.6 g/dL (ref 3.5–5.0)
Alkaline Phosphatase: 144 U/L — ABNORMAL HIGH (ref 38–126)
Anion gap: 8 (ref 5–15)
BUN: 29 mg/dL — ABNORMAL HIGH (ref 8–23)
CO2: 25 mmol/L (ref 22–32)
Calcium: 9 mg/dL (ref 8.9–10.3)
Chloride: 106 mmol/L (ref 98–111)
Creatinine, Ser: 1.15 mg/dL — ABNORMAL HIGH (ref 0.44–1.00)
GFR, Estimated: 47 mL/min — ABNORMAL LOW (ref >60)
Glucose, Bld: 171 mg/dL — ABNORMAL HIGH (ref 70–99)
Potassium: 4.8 mmol/L (ref 3.5–5.1)
Sodium: 139 mmol/L (ref 135–145)
Total Bilirubin: 0.5 mg/dL (ref 0.3–1.2)
Total Protein: 7 g/dL (ref 6.5–8.1)

## 2022-02-09 LAB — GLUCOSE, CAPILLARY: Glucose-Capillary: 120 mg/dL — ABNORMAL HIGH (ref 70–99)

## 2022-02-09 MED ORDER — VANCOMYCIN HCL 750 MG/150ML IV SOLN
750.0000 mg | INTRAVENOUS | Status: DC
  Administered 2022-02-10 – 2022-02-12 (×3): 750 mg via INTRAVENOUS
  Filled 2022-02-09 (×5): qty 150

## 2022-02-09 MED ORDER — LISINOPRIL 20 MG PO TABS
20.0000 mg | ORAL_TABLET | Freq: Every day | ORAL | Status: DC
  Administered 2022-02-10 – 2022-02-13 (×4): 20 mg via ORAL
  Filled 2022-02-09 (×4): qty 1

## 2022-02-09 MED ORDER — HYDROCODONE-ACETAMINOPHEN 5-325 MG PO TABS: 1.0000 | ORAL_TABLET | ORAL | Status: DC | PRN

## 2022-02-09 MED ORDER — SODIUM CHLORIDE 0.9 % IV SOLN
2.0000 g | Freq: Two times a day (BID) | INTRAVENOUS | Status: DC
  Administered 2022-02-10 – 2022-02-13 (×7): 2 g via INTRAVENOUS
  Filled 2022-02-09: qty 2
  Filled 2022-02-09 (×2): qty 12.5
  Filled 2022-02-09: qty 2
  Filled 2022-02-09 (×4): qty 12.5
  Filled 2022-02-09: qty 2

## 2022-02-09 MED ORDER — INSULIN ASPART 100 UNIT/ML IJ SOLN
0.0000 [IU] | Freq: Three times a day (TID) | INTRAMUSCULAR | Status: DC
  Administered 2022-02-10: 2 [IU] via SUBCUTANEOUS
  Administered 2022-02-10 – 2022-02-12 (×5): 1 [IU] via SUBCUTANEOUS
  Administered 2022-02-12: 4 [IU] via SUBCUTANEOUS
  Administered 2022-02-12: 2 [IU] via SUBCUTANEOUS
  Administered 2022-02-13: 1 [IU] via SUBCUTANEOUS
  Filled 2022-02-09 (×9): qty 1

## 2022-02-09 MED ORDER — SODIUM CHLORIDE 0.9 % IV SOLN
1.0000 g | Freq: Once | INTRAVENOUS | Status: AC
  Administered 2022-02-09: 1 g via INTRAVENOUS
  Filled 2022-02-09: qty 10

## 2022-02-09 MED ORDER — QUETIAPINE FUMARATE 25 MG PO TABS
25.0000 mg | ORAL_TABLET | Freq: Every day | ORAL | Status: DC
  Administered 2022-02-09 – 2022-02-12 (×4): 25 mg via ORAL
  Filled 2022-02-09 (×4): qty 1

## 2022-02-09 MED ORDER — ATORVASTATIN CALCIUM 20 MG PO TABS
10.0000 mg | ORAL_TABLET | Freq: Every day | ORAL | Status: DC
  Administered 2022-02-10 – 2022-02-13 (×4): 10 mg via ORAL
  Filled 2022-02-09 (×4): qty 1

## 2022-02-09 MED ORDER — VANCOMYCIN HCL 500 MG/100ML IV SOLN
500.0000 mg | Freq: Once | INTRAVENOUS | Status: AC
  Administered 2022-02-09: 500 mg via INTRAVENOUS
  Filled 2022-02-09 (×2): qty 100

## 2022-02-09 MED ORDER — MORPHINE SULFATE (PF) 2 MG/ML IV SOLN: 1.0000 mg | INTRAVENOUS | Status: DC | PRN

## 2022-02-09 MED ORDER — HYDRALAZINE HCL 20 MG/ML IJ SOLN
20.0000 mg | Freq: Four times a day (QID) | INTRAMUSCULAR | Status: DC | PRN
  Administered 2022-02-09 – 2022-02-12 (×3): 20 mg via INTRAVENOUS
  Filled 2022-02-09 (×3): qty 1

## 2022-02-09 MED ORDER — ACETAMINOPHEN 325 MG PO TABS
650.0000 mg | ORAL_TABLET | Freq: Four times a day (QID) | ORAL | Status: DC | PRN
Start: 2022-02-09 — End: 2022-02-13

## 2022-02-09 MED ORDER — ENOXAPARIN SODIUM 40 MG/0.4ML IJ SOSY
40.0000 mg | PREFILLED_SYRINGE | INTRAMUSCULAR | Status: DC
  Administered 2022-02-09 – 2022-02-12 (×3): 40 mg via SUBCUTANEOUS
  Filled 2022-02-09 (×3): qty 0.4

## 2022-02-09 MED ORDER — ONDANSETRON HCL 4 MG/2ML IJ SOLN: 4.0000 mg | Freq: Four times a day (QID) | INTRAMUSCULAR | Status: DC | PRN

## 2022-02-09 MED ORDER — SODIUM CHLORIDE 0.9 % IV SOLN: 1.0000 g | Freq: Three times a day (TID) | INTRAVENOUS | Status: DC

## 2022-02-09 MED ORDER — ACETAMINOPHEN 650 MG RE SUPP: 650.0000 mg | Freq: Four times a day (QID) | RECTAL | Status: DC | PRN

## 2022-02-09 MED ORDER — ONDANSETRON HCL 4 MG PO TABS: 4.0000 mg | ORAL_TABLET | Freq: Four times a day (QID) | ORAL | Status: DC | PRN

## 2022-02-09 MED ORDER — VANCOMYCIN HCL IN DEXTROSE 1-5 GM/200ML-% IV SOLN
1000.0000 mg | Freq: Once | INTRAVENOUS | Status: AC
  Administered 2022-02-09: 1000 mg via INTRAVENOUS
  Filled 2022-02-09: qty 200

## 2022-02-09 MED ORDER — BISACODYL 5 MG PO TBEC: 5.0000 mg | DELAYED_RELEASE_TABLET | Freq: Every day | ORAL | Status: DC | PRN

## 2022-02-09 NOTE — ED Provider Notes (Signed)
? ?[facility] ?Provider Note ? ? ? Event Date/Time  ? First MD Initiated Contact with Patient 02/09/22 1612   ?  (approximate) ? ? ?History  ? ?Chief Complaint ?Toe Pain ? ? ?HPI ? ?Beth Mcguire is a 81 y.o. female with past medical history of hypertension, hyperlipidemia, diabetes, CKD, peripheral arterial disease, and dementia who presents to the ED complaining of foot wound.  Patient reports that 2 days ago she noticed a wound on the tip of her left second toe.  The toe seems swollen and red with some drainage from the wound.  She states it is slightly painful but she is not aware of any recent trauma to the toe.  She was recently admitted to the hospital for osteomyelitis of left great toe requiring amputation, states current symptoms are similar.  She was initially evaluated in outpatient clinic by her podiatrist, who referred her to the ED for IV antibiotics and admission. ?  ? ? ?Physical Exam  ? ?Triage Vital Signs: ?ED Triage Vitals  ?Enc Vitals Group  ?   BP 02/09/22 1406 (!) 131/114  ?   Pulse Rate 02/09/22 1406 67  ?   Resp 02/09/22 1406 15  ?   Temp 02/09/22 1406 98.8 ?F (37.1 ?C)  ?   Temp Source 02/09/22 1406 Oral  ?   SpO2 02/09/22 1406 100 %  ?   Weight --   ?   Height 02/09/22 1403 '5\' 3"'$  (1.6 m)  ?   Head Circumference --   ?   Peak Flow --   ?   Pain Score 02/09/22 1403 0  ?   Pain Loc --   ?   Pain Edu? --   ?   Excl. in [place]? --   ? ? ?Most recent vital signs: ?Vitals:  ? 02/09/22 1406  ?BP: (!) 131/114  ?Pulse: 67  ?Resp: 15  ?Temp: 98.8 ?F (37.1 ?C)  ?SpO2: 100%  ? ? ?Constitutional: Alert and oriented. ?Eyes: Conjunctivae are normal. ?Head: Atraumatic. ?Nose: No congestion/rhinnorhea. ?Mouth/Throat: Mucous membranes are moist.  ?Cardiovascular: Normal rate, regular rhythm. Grossly normal heart sounds.  2+ radial and DP pulses bilaterally. ?Respiratory: Normal respiratory effort.  No retractions. Lungs CTAB. ?Gastrointestinal: Soft and nontender. No  distention. ?Musculoskeletal: Erythema, edema, and warmth noted to left second toe with wound on the tip of toe with purulent drainage.  No erythema or warmth extending into foot. ?Neurologic:  Normal speech and language. No gross focal neurologic deficits are appreciated. ? ? ? ?ED Results / Procedures / Treatments  ? ?Labs ?(all labs ordered are listed, but only abnormal results are displayed) ?Labs Reviewed  ?CBC WITH DIFFERENTIAL/PLATELET - Abnormal; Notable for the following components:  ?    Result Value  ? Hemoglobin 11.2 (*)   ? All other components within normal limits  ?COMPREHENSIVE METABOLIC PANEL - Abnormal; Notable for the following components:  ? Glucose, Bld 171 (*)   ? BUN 29 (*)   ? Creatinine, Ser 1.15 (*)   ? AST 13 (*)   ? Alkaline Phosphatase 144 (*)   ? GFR, Estimated 47 (*)   ? All other components within normal limits  ?CULTURE, BLOOD (ROUTINE X 2)  ?CULTURE, BLOOD (ROUTINE X 2)  ? ?RADIOLOGY ?X-ray of left foot reviewed by me with bony breakdown at the left second toe concerning for osteomyelitis. ? ?PROCEDURES: ? ?Critical Care performed: No ? ?Procedures ? ? ?MEDICATIONS ORDERED IN ED: ?Medications  ?ceFEPIme (MAXIPIME)  1 g in sodium chloride 0.9 % 100 mL IVPB (has no administration in time range)  ?vancomycin (VANCOCIN) IVPB 1000 mg/200 mL premix (has no administration in time range)  ? ? ? ?IMPRESSION / MDM / ASSESSMENT AND PLAN / ED COURSE  ?I reviewed the triage vital signs and the nursing notes. ?             ?               ? ?81 y.o. female with past medical history of hypertension, hyperlipidemia, diabetes, PAD, CKD, and dementia who presents to the ED complaining of 2 days of worsening left second toe redness, swelling, and drainage. ? ?Differential diagnosis includes, but is not limited to, cellulitis, osteomyelitis, diabetic foot wound, traumatic injury. ? ?Patient well-appearing and in no acute distress, vital signs are unremarkable and not consistent with sepsis.  Patient  does have apparent developing infection to her left second toe with ulceration to the tip.  X-ray is concerning for developing osteomyelitis and we will start patient on broad-spectrum antibiotics.  Labs are reassuring with stable chronic kidney disease, no electrolyte abnormality noted and LFTs within normal limits.  CBC shows stable anemia with no leukocytosis.  Patient was apparently referral to the ED by podiatry for admission and IV antibiotics with potential amputation.  Case discussed with hospitalist for admission. ? ?  ? ? ?FINAL CLINICAL IMPRESSION(S) / ED DIAGNOSES  ? ?Final diagnoses:  ?Toe osteomyelitis ([place])  ?Diabetic foot infection ([place])  ? ? ? ?Rx / DC Orders  ? ?ED Discharge Orders   ? ? None  ? ?  ? ? ? ?Note:  This document was prepared using Dragon voice recognition software and may include unintentional dictation errors. ?  ?Blake Divine, MD ?02/09/22 1703 ? ?

## 2022-02-09 NOTE — ED Triage Notes (Signed)
Patient to ER from [facility] clinic, reports that two days ago she noticed her left second toe was bleeding. Patient went to [facility] clinic and they told her she could see the bone. Patient with partial amputation present to big toe. Patient diabetic.  ?

## 2022-02-09 NOTE — ED Notes (Signed)
First Nurse Note:  Pt to ED from [facility] for a left foot wound. Pt is in NAD.  ?

## 2022-02-09 NOTE — Consult Note (Signed)
Pharmacy Antibiotic Note ? ?Beth Mcguire is a 81 y.o. 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  Pharmacy has been consulted for Vancomycin dosing. ? ?Plan: ?F/u MRSA PCR ordered & cultures. ?Vancomycin 1g IV x1 in ED + Vanc '500mg'$  IV x1 now to complete loading dose; followed by maintenance dosing of vancomycin '750mg'$  q24h starting in approx.18 hours (5/06 1200)  ?Goal AUC 400-600 ?Est AUC: 506; Cmax: 29.6; Cmin: 14.9 ?SCr 1.15; IBW; Vd 0.72 ?Pt also receiving Cefepime 1g IV q8h > adjusted per renal fxn  and indication to Cefepime 2g IV q12h. Since pt received 1st dose as 1g, will order 1g x1 to complete loading dose. ? ?Height: '5\' 3"'$  (160 cm) ?Weight: 68.9 kg (151 lb 14.4 oz) ?IBW/kg (Calculated) : 52.4 ? ?Temp (24hrs), Avg:98.8 ?F (37.1 ?C), Min:98.7 ?F (37.1 ?C), Max:98.8 ?F (37.1 ?C) ? ?Recent Labs  ?Lab 02/09/22 ?1407  ?WBC 9.1  ?CREATININE 1.15*  ?  ?Estimated Creatinine Clearance: 34.5 mL/min (A) (by C-G formula based on SCr of 1.15 mg/dL (H)).   ? ?Allergies  ?Allergen Reactions  ? Amoxicillin-Pot Clavulanate Rash  ? ? ?Antimicrobials this admission: ?VAN/CFP (5/05 >>  ? ?Dose adjustments this admission: ?CTM and Adjust PRN. ? ?Microbiology results: ?5/05 BCx: pending ?5/05 MRSA PCR: pending ? ?Thank you for allowing pharmacy to be a part of this patient?s care. ? ?Shanon Brow Solymar Grace ?02/09/2022 6:07 PM ? ?

## 2022-02-09 NOTE — H&P (Addendum)
History and Physical    Beth Mcguire:[id number] DOB: 1941-02-23 DOA: 02/09/2022  PCP: Kandyce Rud, MD  Patient coming from: podiatry office    Chief Complaint: left 2nd toe reddness   HPI: 81 y/o F w/ PMH of HTN, HLD, DM2, PVD, dementia who presents w/ left 2nd toe redness x 2-3 days. Majority of hx was obtained from pt and she is poor historian. The left 2nd toe has been red and swollen x 2-3 days. Pt denies any pain and/or discomfort of left 2nd toe. Pt is unsure if she was taking abxs at home or not. Pt denies any fevers, chills, sweating, chest pain, shortness of breath, nausea, vomiting, abd pain, dysuria, urinary urgency, urinary frequency, diarrhea or constipation. Pt lives with her daughter and uses a walker to ambulate.   Review of Systems: As per HPI otherwise 14 point review of systems negative.    Past Medical History:  Diagnosis Date   Anxiety    Colon cancer (HCC)    Dementia (HCC) 2022   Diabetes (HCC)    Hypertension    Peripheral vascular disease (HCC)     Past Surgical History:  Procedure Laterality Date   AMPUTATION Right 01/14/2016   Procedure: AMPUTATION RAY;  Surgeon: Recardo Evangelist, DPM;  Location: [facility];  Service: Podiatry;  Laterality: Right;   AMPUTATION Left 11/26/2021   Procedure: AMPUTATION FIRST RAY;  Surgeon: Linus Galas, DPM;  Location: [facility];  Service: Podiatry;  Laterality: Left;   COLON SURGERY     LOWER EXTREMITY ANGIOGRAPHY Left 08/14/2017   Procedure: Lower Extremity Angiography;  Surgeon: Annice Needy, MD;  Location: [facility];  Service: Cardiovascular;  Laterality: Left;   LOWER EXTREMITY ANGIOGRAPHY Left 11/06/2021   Procedure: LOWER EXTREMITY ANGIOGRAPHY;  Surgeon: Annice Needy, MD;  Location: [facility];  Service: Cardiovascular;  Laterality: Left;   PERIPHERAL VASCULAR CATHETERIZATION N/A 01/05/2016   Procedure: Lower Extremity Angiography;  Surgeon: Annice Needy, MD;  Location: [facility];   Service: Cardiovascular;  Laterality: N/A;   PERIPHERAL VASCULAR CATHETERIZATION  01/05/2016   Procedure: Lower Extremity Intervention;  Surgeon: Annice Needy, MD;  Location: [facility];  Service: Cardiovascular;;   PERIPHERAL VASCULAR CATHETERIZATION Right 06/25/2016   Procedure: Lower Extremity Angiography;  Surgeon: Annice Needy, MD;  Location: [facility];  Service: Cardiovascular;  Laterality: Right;   PERIPHERAL VASCULAR CATHETERIZATION  06/25/2016   Procedure: Lower Extremity Intervention;  Surgeon: Annice Needy, MD;  Location: [facility];  Service: Cardiovascular;;   TONSILLECTOMY       reports that she has never smoked. She has never used smokeless tobacco. She reports that she does not drink alcohol and does not use drugs.  Allergies  Allergen Reactions   Amoxicillin-Pot Clavulanate Rash    Family History  Problem Relation Age of Onset   Diabetes Other    Prior to Admission medications   Medication Sig Start Date End Date Taking? Authorizing Provider  aspirin 81 MG tablet Take 1 tablet (81 mg total) by mouth daily. 01/06/16   Milagros Loll, MD  atorvastatin (LIPITOR) 10 MG tablet Take 10 mg every evening by mouth.  02/08/16   [provider]  B-D ULTRAFINE III SHORT PEN 31G X 8 MM MISC  08/15/17   [provider]  Blood Glucose Monitoring Suppl (ONE TOUCH ULTRA MINI) w/Device KIT  07/23/16   [provider]  clopidogrel (PLAVIX) 75 MG tablet  TAKE 1 TABLET BY MOUTH EVERY DAY Patient taking differently: Take 75 mg by mouth daily. 09/22/20   Georgiana Spinner, NP  glucose monitoring kit (FREESTYLE) monitoring kit 1 each by Does not apply route 4 (four) times daily -  before meals and at bedtime. 01/06/16   Milagros Loll, MD  HYDROcodone-acetaminophen (NORCO/VICODIN) 5-325 MG tablet Take 1 tablet by mouth every 6 (six) hours as needed for moderate pain. 11/29/21   Marrion Coy, MD  lisinopril (PRINIVIL,ZESTRIL) 20 MG tablet Take 20 mg  by mouth daily.    [provider]  metFORMIN (GLUCOPHAGE-XR) 500 MG 24 hr tablet Take 500 mg by mouth daily with supper. 08/15/17   [provider]  NOVOLOG MIX 70/30 FLEXPEN (70-30) 100 UNIT/ML FlexPen Inject 25 Units into the skin 2 (two) times daily before a meal. 10/16/21   [provider]  ONE TOUCH ULTRA TEST test strip  07/23/16   [provider]  Dola Argyle LANCETS 33G MISC  08/30/17   [provider]    Physical Exam: Vitals:   02/09/22 1403 02/09/22 1406  BP:  (!) 131/114  Pulse:  67  Resp:  15  Temp:  98.8 F (37.1 C)  TempSrc:  Oral  SpO2:  100%  Height: 5\' 3"  (1.6 m)     Constitutional: NAD, calm, comfortable Vitals:   02/09/22 1403 02/09/22 1406  BP:  (!) 131/114  Pulse:  67  Resp:  15  Temp:  98.8 F (37.1 C)  TempSrc:  Oral  SpO2:  100%  Height: 5\' 3"  (1.6 m)    Eyes: PERRL, lids and conjunctivae normal ENMT: Mucous membranes are moist. Poor dentition, missing multiple teeth Neck: normal, supple Respiratory: clear to auscultation bilaterally, no wheezing, no crackles. Normal respiratory effort. No accessory muscle use.  Cardiovascular: S1/S2+. no rubs / gallops Abdomen: soft, no tenderness, ND. Normal bowel sounds  Musculoskeletal: no clubbing / cyanosis. Moves all extremities  Skin: no rashes. Left 2nd toe erythema, edema Neurologic: CN 2-12 grossly intact. Decreased strength of b/l LE  Psychiatric: judgment and insight are poor. Flat mood and affect   Labs on Admission: I have personally reviewed following labs and imaging studies  CBC: Recent Labs  Lab 02/09/22 1407  WBC 9.1  NEUTROABS 5.8  HGB 11.2*  HCT 36.2  MCV 91.2  PLT 350   Basic Metabolic Panel: Recent Labs  Lab 02/09/22 1407  NA 139  K 4.8  CL 106  CO2 25  GLUCOSE 171*  BUN 29*  CREATININE 1.15*  CALCIUM 9.0   GFR: CrCl cannot be calculated (Unknown ideal weight.). Liver Function Tests: Recent Labs  Lab 02/09/22 1407   AST 13*  ALT 13  ALKPHOS 144*  BILITOT 0.5  PROT 7.0  ALBUMIN 3.6   No results for input(s): LIPASE, AMYLASE in the last 168 hours. No results for input(s): AMMONIA in the last 168 hours. Coagulation Profile: No results for input(s): INR, PROTIME in the last 168 hours. Cardiac Enzymes: No results for input(s): CKTOTAL, CKMB, CKMBINDEX, TROPONINI in the last 168 hours. BNP (last 3 results) No results for input(s): PROBNP in the last 8760 hours. HbA1C: No results for input(s): HGBA1C in the last 72 hours. CBG: No results for input(s): GLUCAP in the last 168 hours. Lipid Profile: No results for input(s): CHOL, HDL, LDLCALC, TRIG, CHOLHDL, LDLDIRECT in the last 72 hours. Thyroid Function Tests: No results for input(s): TSH, T4TOTAL, FREET4, T3FREE, THYROIDAB in the last 72 hours. Anemia Panel: No  results for input(s): VITAMINB12, FOLATE, FERRITIN, TIBC, IRON, RETICCTPCT in the last 72 hours. Urine analysis:    Component Value Date/Time   COLORURINE YELLOW (A) 11/14/2021 2017   APPEARANCEUR CLEAR (A) 11/14/2021 2017   LABSPEC 1.019 11/14/2021 2017   PHURINE 5.0 11/14/2021 2017   GLUCOSEU 150 (A) 11/14/2021 2017   HGBUR NEGATIVE 11/14/2021 2017   BILIRUBINUR NEGATIVE 11/14/2021 2017   KETONESUR NEGATIVE 11/14/2021 2017   PROTEINUR NEGATIVE 11/14/2021 2017   NITRITE NEGATIVE 11/14/2021 2017   LEUKOCYTESUR NEGATIVE 11/14/2021 2017    Radiological Exams on Admission: DG Foot Complete Left  Result Date: 02/09/2022 CLINICAL DATA:  Wound. Second toe pain. History of diabetes. Peripheral vascular disease. EXAM: LEFT FOOT - COMPLETE 3+ VIEW COMPARISON:  None Available. FINDINGS: There is diffuse decreased bone mineralization. Postsurgical changes are seen of amputation of the first ray to the level of the proximal to mid metatarsal shaft. There is a sharp border of the resection line. Mild-to-moderate plantar calcaneal heel spur. Minimal chronic spurring at the Achilles insertion on  the calcaneus. Mild talonavicular, navicular-cuneiform and tarsometatarsal joint space narrowing and dorsal degenerative osteophytes. There appears to be a soft tissue wound at the distal aspect of the second toe. There is mild erosion of the distal aspect of the distal phalanx of second toe suspicious for acute osteomyelitis. Mild bone fragmentation of this region on lateral view. IMPRESSION: Soft tissue wound of the distal aspect of second toe with mild cortical erosion of the distal aspect of the distal phalanx of second toe concerning for acute osteomyelitis. Electronically Signed   By: Neita Garnet M.D.   On: 02/09/2022 14:30    EKG: Independently reviewed.   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 Continue on IV vanco, cefepime. NPO after midnight. Holding home dose of aspirin, plavix for likely surgery tomorrow. Podiatry consulted, Dr. Ether Griffins. Noroc, morphine prn for pain   DM2: likely poorly controlled. Started on SSI w/ accuchecks. Holding metformin   Hx of PVD: holding home dose of aspirin, plavix for likely surgery tomorrow   HTN: continue on home dose of lisinopril. IV hydralazine prn   Likely CKD: baseline Cr/GFR is unknown. Currently stage IIIa. Consider holding lisinopril if Cr continue to rise  HLD: continue on statin  Dementia: continue w/ supportive care. Continue on home dose of seroquel. Re-orient prn      DVT prophylaxis: lovenox  Code Status:  full  Family Communication: discussed pt's care w/ pt's daughter, Kendal Hymen, and answered her questions  Disposition Plan:  depends on PT/OT recs (not consulted yet)  Consults called: Podiatry, Dr. Ether Griffins Admission status: obs   Charise Killian MD [facility]   If 7PM-7AM, please contact night-coverage   02/09/2022, 5:06 PM

## 2022-02-09 NOTE — ED Notes (Signed)
Pt brief changed. Pt placed on purewick. Pt bed alarm turned on ?

## 2022-02-10 ENCOUNTER — Observation Stay: Payer: Medicare HMO

## 2022-02-10 DIAGNOSIS — N1831 Chronic kidney disease, stage 3a: Secondary | ICD-10-CM | POA: Diagnosis present

## 2022-02-10 DIAGNOSIS — I129 Hypertensive chronic kidney disease with stage 1 through stage 4 chronic kidney disease, or unspecified chronic kidney disease: Secondary | ICD-10-CM | POA: Diagnosis present

## 2022-02-10 DIAGNOSIS — E785 Hyperlipidemia, unspecified: Secondary | ICD-10-CM | POA: Diagnosis present

## 2022-02-10 DIAGNOSIS — Z89411 Acquired absence of right great toe: Secondary | ICD-10-CM | POA: Diagnosis not present

## 2022-02-10 DIAGNOSIS — M86672 Other chronic osteomyelitis, left ankle and foot: Secondary | ICD-10-CM | POA: Diagnosis not present

## 2022-02-10 DIAGNOSIS — Z794 Long term (current) use of insulin: Secondary | ICD-10-CM | POA: Diagnosis not present

## 2022-02-10 DIAGNOSIS — Z833 Family history of diabetes mellitus: Secondary | ICD-10-CM | POA: Diagnosis not present

## 2022-02-10 DIAGNOSIS — M868X7 Other osteomyelitis, ankle and foot: Secondary | ICD-10-CM

## 2022-02-10 DIAGNOSIS — F0390 Unspecified dementia without behavioral disturbance: Secondary | ICD-10-CM | POA: Diagnosis present

## 2022-02-10 DIAGNOSIS — Z89412 Acquired absence of left great toe: Secondary | ICD-10-CM | POA: Diagnosis not present

## 2022-02-10 DIAGNOSIS — E11628 Type 2 diabetes mellitus with other skin complications: Secondary | ICD-10-CM | POA: Diagnosis present

## 2022-02-10 DIAGNOSIS — Z7982 Long term (current) use of aspirin: Secondary | ICD-10-CM | POA: Diagnosis not present

## 2022-02-10 DIAGNOSIS — E11621 Type 2 diabetes mellitus with foot ulcer: Secondary | ICD-10-CM | POA: Diagnosis present

## 2022-02-10 DIAGNOSIS — L97529 Non-pressure chronic ulcer of other part of left foot with unspecified severity: Secondary | ICD-10-CM | POA: Diagnosis present

## 2022-02-10 DIAGNOSIS — Z7984 Long term (current) use of oral hypoglycemic drugs: Secondary | ICD-10-CM | POA: Diagnosis not present

## 2022-02-10 DIAGNOSIS — E1151 Type 2 diabetes mellitus with diabetic peripheral angiopathy without gangrene: Secondary | ICD-10-CM | POA: Diagnosis present

## 2022-02-10 DIAGNOSIS — I739 Peripheral vascular disease, unspecified: Secondary | ICD-10-CM | POA: Diagnosis not present

## 2022-02-10 DIAGNOSIS — Z7902 Long term (current) use of antithrombotics/antiplatelets: Secondary | ICD-10-CM | POA: Diagnosis not present

## 2022-02-10 DIAGNOSIS — Z79899 Other long term (current) drug therapy: Secondary | ICD-10-CM | POA: Diagnosis not present

## 2022-02-10 DIAGNOSIS — Z85038 Personal history of other malignant neoplasm of large intestine: Secondary | ICD-10-CM | POA: Diagnosis not present

## 2022-02-10 DIAGNOSIS — E1140 Type 2 diabetes mellitus with diabetic neuropathy, unspecified: Secondary | ICD-10-CM | POA: Diagnosis present

## 2022-02-10 DIAGNOSIS — E1122 Type 2 diabetes mellitus with diabetic chronic kidney disease: Secondary | ICD-10-CM | POA: Diagnosis present

## 2022-02-10 DIAGNOSIS — E1169 Type 2 diabetes mellitus with other specified complication: Secondary | ICD-10-CM | POA: Diagnosis present

## 2022-02-10 DIAGNOSIS — M869 Osteomyelitis, unspecified: Secondary | ICD-10-CM | POA: Diagnosis present

## 2022-02-10 DIAGNOSIS — Z8616 Personal history of COVID-19: Secondary | ICD-10-CM | POA: Diagnosis not present

## 2022-02-10 LAB — URINALYSIS, COMPLETE (UACMP) WITH MICROSCOPIC
Bilirubin Urine: NEGATIVE
Glucose, UA: NEGATIVE mg/dL
Ketones, ur: NEGATIVE mg/dL
Nitrite: NEGATIVE
Protein, ur: NEGATIVE mg/dL
Specific Gravity, Urine: 1.013 (ref 1.005–1.030)
WBC, UA: >50 WBC/hpf — ABNORMAL HIGH (ref 0–5)
pH: 6 (ref 5.0–8.0)

## 2022-02-10 LAB — CBC
HCT: 34.6 % — ABNORMAL LOW (ref 36.0–46.0)
Hemoglobin: 10.8 g/dL — ABNORMAL LOW (ref 12.0–15.0)
MCH: 27.8 pg (ref 26.0–34.0)
MCHC: 31.2 g/dL (ref 30.0–36.0)
MCV: 89.2 fL (ref 80.0–100.0)
Platelets: 334 10*3/uL (ref 150–400)
RBC: 3.88 MIL/uL (ref 3.87–5.11)
RDW: 14.2 % (ref 11.5–15.5)
WBC: 8.9 10*3/uL (ref 4.0–10.5)
nRBC: 0 % (ref 0.0–0.2)

## 2022-02-10 LAB — BASIC METABOLIC PANEL
Anion gap: 5 (ref 5–15)
BUN: 32 mg/dL — ABNORMAL HIGH (ref 8–23)
CO2: 24 mmol/L (ref 22–32)
Calcium: 8.9 mg/dL (ref 8.9–10.3)
Chloride: 109 mmol/L (ref 98–111)
Creatinine, Ser: 0.94 mg/dL (ref 0.44–1.00)
GFR, Estimated: >60 mL/min (ref >60)
Glucose, Bld: 161 mg/dL — ABNORMAL HIGH (ref 70–99)
Potassium: 4.4 mmol/L (ref 3.5–5.1)
Sodium: 138 mmol/L (ref 135–145)

## 2022-02-10 LAB — GLUCOSE, CAPILLARY
Glucose-Capillary: 162 mg/dL — ABNORMAL HIGH (ref 70–99)
Glucose-Capillary: 200 mg/dL — ABNORMAL HIGH (ref 70–99)
Glucose-Capillary: 242 mg/dL — ABNORMAL HIGH (ref 70–99)
Glucose-Capillary: 220 mg/dL — ABNORMAL HIGH (ref 70–99)

## 2022-02-10 LAB — SURGICAL PCR SCREEN
MRSA, PCR: NEGATIVE
Staphylococcus aureus: NEGATIVE

## 2022-02-10 MED ORDER — CHLORHEXIDINE GLUCONATE 4 % EX LIQD
60.0000 mL | Freq: Once | CUTANEOUS | Status: AC
  Administered 2022-02-10: 4 via TOPICAL

## 2022-02-10 NOTE — Consult Note (Signed)
?ORTHOPAEDIC CONSULTATION ? ?REQUESTING PHYSICIAN: Wyvonnia Dusky, MD ? ?Chief Complaint: Left second toe infection ? ?HPI: ?Beth Mcguire is a 81 y.o. female who complains of worsening left second toe infection.  Was seen in the outpatient clinic by Dr. Sharlotte Alamo yesterday.  Had noted exposed bone with infection on the distal aspect of the left second toe.  Sent to the hospital for work-up and likely debridement. ? ?Past Medical History:  ?Diagnosis Date  ? Anxiety   ? Colon cancer ([place])   ? Dementia ([place]) 2022  ? Diabetes ([place])   ? Hypertension   ? Peripheral vascular disease ([place])   ? ?Past Surgical History:  ?Procedure Laterality Date  ? AMPUTATION Right 01/14/2016  ? Procedure: AMPUTATION RAY;  Surgeon: Albertine Patricia, DPM;  Location: [facility];  Service: Podiatry;  Laterality: Right;  ? AMPUTATION Left 11/26/2021  ? Procedure: AMPUTATION FIRST RAY;  Surgeon: Sharlotte Alamo, DPM;  Location: [facility];  Service: Podiatry;  Laterality: Left;  ? COLON SURGERY    ? LOWER EXTREMITY ANGIOGRAPHY Left 08/14/2017  ? Procedure: Lower Extremity Angiography;  Surgeon: Algernon Huxley, MD;  Location: [place] [facility];  Service: Cardiovascular;  Laterality: Left;  ? LOWER EXTREMITY ANGIOGRAPHY Left 11/06/2021  ? Procedure: LOWER EXTREMITY ANGIOGRAPHY;  Surgeon: Algernon Huxley, MD;  Location: [place] [facility];  Service: Cardiovascular;  Laterality: Left;  ? PERIPHERAL VASCULAR CATHETERIZATION N/A 01/05/2016  ? Procedure: Lower Extremity Angiography;  Surgeon: Algernon Huxley, MD;  Location: [place] [facility];  Service: Cardiovascular;  Laterality: N/A;  ? PERIPHERAL VASCULAR CATHETERIZATION  01/05/2016  ? Procedure: Lower Extremity Intervention;  Surgeon: Algernon Huxley, MD;  Location: [place] [facility];  Service: Cardiovascular;;  ? PERIPHERAL VASCULAR CATHETERIZATION Right 06/25/2016  ? Procedure: Lower Extremity Angiography;  Surgeon: Algernon Huxley, MD;  Location: [place] [facility];  Service: Cardiovascular;  Laterality:  Right;  ? PERIPHERAL VASCULAR CATHETERIZATION  06/25/2016  ? Procedure: Lower Extremity Intervention;  Surgeon: Algernon Huxley, MD;  Location: [place] [facility];  Service: Cardiovascular;;  ? TONSILLECTOMY    ? ?Social History  ? ?Socioeconomic History  ? Marital status: Widowed  ?  Spouse name: Not on file  ? Number of children: 3  ? Years of education: Not on file  ? Highest education level: Not on file  ?Occupational History  ? Not on file  ?Tobacco Use  ? Smoking status: Never  ? Smokeless tobacco: Never  ?Substance and Sexual Activity  ? Alcohol use: No  ?  Alcohol/week: 0.0 standard drinks  ? Drug use: No  ? Sexual activity: Not Currently  ?Other Topics Concern  ? Not on file  ?Social History Narrative  ? Daughter And her 2 kids   ? lives with pt.   ? ?Social Determinants of Health  ? ?Financial Resource Strain: Not on file  ?Food Insecurity: Not on file  ?Transportation Needs: Not on file  ?Physical Activity: Not on file  ?Stress: Not on file  ?Social Connections: Not on file  ? ?Family History  ?Problem Relation Age of Onset  ? Diabetes Other   ? ?Allergies  ?Allergen Reactions  ? Amoxicillin-Pot Clavulanate Rash  ? ?Prior to Admission medications   ?Medication Sig Start Date End Date Taking? Authorizing Provider  ?aspirin 81 MG tablet Take 1 tablet (81 mg total) by mouth daily. 01/06/16  Yes Sudini, Alveta Heimlich, MD  ?atorvastatin (LIPITOR) 10 MG tablet Take 10 mg every evening by mouth.  02/08/16  Yes [provider]  ?clopidogrel (PLAVIX) 75 MG tablet TAKE 1 TABLET BY MOUTH EVERY DAY 09/22/20  Yes Kris Hartmann, NP  ?lisinopril (PRINIVIL,ZESTRIL) 20 MG tablet Take 20 mg by mouth daily.   Yes [provider]  ?metFORMIN (GLUCOPHAGE-XR) 500 MG 24 hr tablet Take 500 mg by mouth daily with supper. 08/15/17  Yes [provider]  ?NOVOLOG MIX 70/30 FLEXPEN (70-30) 100 UNIT/ML FlexPen Inject 25 Units into the skin 2 (two) times daily before a meal. 10/16/21  Yes [provider]   ?QUEtiapine (SEROQUEL) 25 MG tablet Take 25 mg by mouth at bedtime. 12/15/21  Yes [provider]  ?B-D ULTRAFINE III SHORT PEN 31G X 8 MM MISC  08/15/17   [provider]  ?Blood Glucose Monitoring Suppl (ONE TOUCH ULTRA MINI) w/Device KIT  07/23/16   [provider]  ?glucose monitoring kit (FREESTYLE) monitoring kit 1 each by Does not apply route 4 (four) times daily -  before meals and at bedtime. 01/06/16   Hillary Bow, MD  ?HYDROcodone-acetaminophen (NORCO/VICODIN) 5-325 MG tablet Take 1 tablet by mouth every 6 (six) hours as needed for moderate pain. ?Patient not taking: Reported on 02/09/2022 11/29/21   Sharen Hones, MD  ?ONE TOUCH ULTRA TEST test strip  07/23/16   [provider]  ?Jonetta Speak LANCETS 25Q [place]  08/30/17   [provider]  ? ?DG Foot Complete Left ? ?Result Date: 02/09/2022 ?CLINICAL DATA:  Wound. Second toe pain. History of diabetes. Peripheral vascular disease. EXAM: LEFT FOOT - COMPLETE 3+ VIEW COMPARISON:  None Available. FINDINGS: There is diffuse decreased bone mineralization. Postsurgical changes are seen of amputation of the first ray to the level of the proximal to mid metatarsal shaft. There is a sharp border of the resection line. Mild-to-moderate plantar calcaneal heel spur. Minimal chronic spurring at the Achilles insertion on the calcaneus. Mild talonavicular, navicular-cuneiform and tarsometatarsal joint space narrowing and dorsal degenerative osteophytes. There appears to be a soft tissue wound at the distal aspect of the second toe. There is mild erosion of the distal aspect of the distal phalanx of second toe suspicious for acute osteomyelitis. Mild bone fragmentation of this region on lateral view. IMPRESSION: Soft tissue wound of the distal aspect of second toe with mild cortical erosion of the distal aspect of the distal phalanx of second toe concerning for acute osteomyelitis. Electronically Signed   By: Yvonne Kendall M.D.    On: 02/09/2022 14:30   ? ?Positive ROS: All other systems have been reviewed and were otherwise negative with the exception of those mentioned in the HPI and as above.  12 point ROS was performed. ? ?Physical Exam: ?General: Alert and oriented.  No apparent distress. ? ?Vascular: ? Left foot:Dorsalis Pedis:  diminished ?Posterior Tibial:  absent ? Right foot: Dorsalis Pedis:  diminished ?Posterior Tibial:  absent ?Foot is warm to touch.  Capillary fill time is brisk. ?Neuro:absent protective sensation ? ?Derm: Right foot without ulceration.  Distal aspect of the left second toe has obvious exposed bone with erythema and scant drainage. ? ?Ortho/MS: She status post bilateral great toe amputation. ? ?Assessment: ?Osteomyelitis distal left second toe ?Peripheral vascular disease ?Diabetes with neuropathy ? ?Plan: ?Plan will be for debridement partial amputation of the left second toe.  Will order noninvasive testing for now.  Her foot is warm and we are doing a very distal amputation.  If there is concern for healing or poor perfusion intraoperatively we will ask  vascular surgery to evaluate.  She underwent recent angio with stenting to her left lower extremity in January.  I discussed the surgery with the patient today in detail. ? ? ? ?Elesa Hacker, DPM ?Cell [phone] ? ? ?02/10/2022 ?7:51 AM ? ? ?

## 2022-02-10 NOTE — Progress Notes (Addendum)
?PROGRESS NOTE ? ? ? ?Beth Mcguire  YSA:[id number] DOB: 03-20-1941 DOA: 02/09/2022 ?PCP: [name], MD  ? ?Assessment & Plan: ?  ?Principal Problem: ?  Diabetic foot ulcer ([place]) ? ?Assessment and Plan: ? 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 Continue on IV vanco, cefepime. Will go for surgery tomorrow as per podiatry. NPO after midnight. Continue to hold aspirin, plavix. Norco, morphine prn for pain. Podiatry recs apprec  ? ?DM2: pretty well controlled, HbA1c 6.7, in Feb 2023. Continue on SSI w/ accuchecks  ?  ?Hx of PVD: continue to hold home dose of aspirin, plavix for surgery tomorrow  ?  ?HTN: continue on lisinopril.  ?  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 Currently stage IIIa. Cr is trending down from day prior ? ?HLD: continue on statin ?  ?Dementia: continue on home dose of seroquel. Continue w/ supportive care  ? ? ? ? ?DVT prophylaxis: lovenox ?Code Status:  full  ?Family Communication:  ?Disposition Plan:  depends on PT/OT recs (not consulted yet)  ? ?Level of care: Med-Surg ? ?Status is: Observation ?The patient remains OBS appropriate and will d/c before 2 midnights. ? ? ? ?Consultants:  ?Podiatry  ? ?Procedures:  ? ?Antimicrobials: vanco, cefepime  ? ? ?Subjective: ?Pt c/o malaise  ? ?Objective: ?Vitals:  ? 02/09/22 1859 02/09/22 2002 02/10/22 3235 02/10/22 5732  ?BP: (!) 111/43 (!) 162/49 (!) 169/56 (!) 144/45  ?Pulse: 80 81 69 77  ?Resp: '20 18 16   '$ ?Temp: 97.8 ?F (36.6 ?C) 97.8 ?F (36.6 ?C) 98.2 ?F (36.8 ?C)   ?TempSrc: Oral  Oral   ?SpO2: 100% 100% 98%   ?Weight:      ?Height:      ? ?No intake or output data in the 24 hours ending 02/10/22 0726 ?Filed Weights  ? 02/09/22 1700  ?Weight: 68.9 kg  ? ? ?Examination: ? ?General exam: Appears calm and comfortable  ?Respiratory system: Clear to auscultation. Respiratory effort normal. ?Cardiovascular system: S1 & S2 + No rubs, gallops or clicks. ?Gastrointestinal system: Abdomen is nondistended, soft and nontender.  Normal bowel sounds  heard. ?Central nervous system: Alert and awake. Moves all extremities  ?Psychiatry: Judgement and insight appears at baseline. Flat mood and affect ? ? ? ?Data Reviewed: I have personally reviewed following labs and imaging studies ? ?CBC: ?Recent Labs  ?Lab 02/09/22 ?1407 02/10/22 ?2025  ?WBC 9.1 8.9  ?NEUTROABS 5.8  --   ?HGB 11.2* 10.8*  ?HCT 36.2 34.6*  ?MCV 91.2 89.2  ?PLT 350 334  ? ?Basic Metabolic Panel: ?Recent Labs  ?Lab 02/09/22 ?1407 02/10/22 ?4270  ?NA 139 138  ?K 4.8 4.4  ?CL 106 109  ?CO2 25 24  ?GLUCOSE 171* 161*  ?BUN 29* 32*  ?CREATININE 1.15* 0.94  ?CALCIUM 9.0 8.9  ? ?GFR: ?Estimated Creatinine Clearance: 42.2 mL/min (by C-G formula based on SCr of 0.94 mg/dL). ?Liver Function Tests: ?Recent Labs  ?Lab 02/09/22 ?1407  ?AST 13*  ?ALT 13  ?ALKPHOS 144*  ?BILITOT 0.5  ?PROT 7.0  ?ALBUMIN 3.6  ? ?No results for input(s): LIPASE, AMYLASE in the last 168 hours. ?No results for input(s): AMMONIA in the last 168 hours. ?Coagulation Profile: ?No results for input(s): INR, PROTIME in the last 168 hours. ?Cardiac Enzymes: ?No results for input(s): CKTOTAL, CKMB, CKMBINDEX, TROPONINI in the last 168 hours. ?BNP (last 3 results) ?No results for input(s): PROBNP in the last 8760 hours. ?HbA1C: ?No results for input(s): HGBA1C in the last 72 hours. ?  CBG: ?Recent Labs  ?Lab 02/09/22 ?1957  ?GLUCAP 120*  ? ?Lipid Profile: ?No results for input(s): CHOL, HDL, LDLCALC, TRIG, CHOLHDL, LDLDIRECT in the last 72 hours. ?Thyroid Function Tests: ?No results for input(s): TSH, T4TOTAL, FREET4, T3FREE, THYROIDAB in the last 72 hours. ?Anemia Panel: ?No results for input(s): VITAMINB12, FOLATE, FERRITIN, TIBC, IRON, RETICCTPCT in the last 72 hours. ?Sepsis Labs: ?No results for input(s): PROCALCITON, LATICACIDVEN in the last 168 hours. ? ?Recent Results (from the past 240 hour(s))  ?Culture, blood (routine x 2)     Status: None (Preliminary result)  ? Collection Time: 02/09/22  7:17 PM  ? Specimen: BLOOD  ?Result Value Ref  Range Status  ? Specimen Description BLOOD RIGHT ANTECUBITAL  Final  ? Special Requests   Final  ?  BOTTLES DRAWN AEROBIC AND ANAEROBIC Blood Culture adequate volume  ? Culture   Final  ?  NO GROWTH < 12 HOURS ?Performed at [facility], [address] [place], [place] [address] ?  ? Report Status PENDING  Incomplete  ?Culture, blood (routine x 2)     Status: None (Preliminary result)  ? Collection Time: 02/09/22  7:23 PM  ? Specimen: BLOOD  ?Result Value Ref Range Status  ? Specimen Description BLOOD BLOOD RIGHT HAND  Final  ? Special Requests   Final  ?  BOTTLES DRAWN AEROBIC AND ANAEROBIC Blood Culture adequate volume  ? Culture   Final  ?  NO GROWTH < 12 HOURS ?Performed at [facility], [address], [place], [place] [address] ?  ? Report Status PENDING  Incomplete  ?  ? ? ? ? ? ?Radiology Studies: ?DG Foot Complete Left ? ?Result Date: 02/09/2022 ?CLINICAL DATA:  Wound. Second toe pain. History of diabetes. Peripheral vascular disease. EXAM: LEFT FOOT - COMPLETE 3+ VIEW COMPARISON:  None Available. FINDINGS: There is diffuse decreased bone mineralization. Postsurgical changes are seen of amputation of the first ray to the level of the proximal to mid metatarsal shaft. There is a sharp border of the resection line. Mild-to-moderate plantar calcaneal heel spur. Minimal chronic spurring at the Achilles insertion on the calcaneus. Mild talonavicular, navicular-cuneiform and tarsometatarsal joint space narrowing and dorsal degenerative osteophytes. There appears to be a soft tissue wound at the distal aspect of the second toe. There is mild erosion of the distal aspect of the distal phalanx of second toe suspicious for acute osteomyelitis. Mild bone fragmentation of this region on lateral view. IMPRESSION: Soft tissue wound of the distal aspect of second toe with mild cortical erosion of the distal aspect of the distal phalanx of second toe concerning for acute osteomyelitis. Electronically  Signed   By: Yvonne Kendall M.D.   On: 02/09/2022 14:30   ? ? ? ? ? ?Scheduled Meds: ? atorvastatin  10 mg Oral Daily  ? enoxaparin (LOVENOX) injection  40 mg Subcutaneous Q24H  ? insulin aspart  0-6 Units Subcutaneous TID WC  ? lisinopril  20 mg Oral Daily  ? QUEtiapine  25 mg Oral QHS  ? ?Continuous Infusions: ? ceFEPime (MAXIPIME) IV 2 g (02/10/22 0504)  ? vancomycin    ? ? ? LOS: 0 days  ? ? ?Time spent: 31 mins ? ? ? ?Wyvonnia Dusky, MD ?[facility] ?Pager [phone] ? ?If 7PM-7AM, please contact night-coverage ?02/10/2022, 7:26 AM  ? ?

## 2022-02-11 ENCOUNTER — Encounter: Payer: Self-pay | Admitting: Internal Medicine

## 2022-02-11 ENCOUNTER — Encounter
Admission: EM | Disposition: A | Discharge status: Home Health | Payer: Self-pay | Source: Ambulatory Visit | Attending: Internal Medicine

## 2022-02-11 ENCOUNTER — Inpatient Hospital Stay: Payer: Medicare HMO | Admitting: Anesthesiology

## 2022-02-11 ENCOUNTER — Other Ambulatory Visit: Payer: Self-pay

## 2022-02-11 DIAGNOSIS — E1169 Type 2 diabetes mellitus with other specified complication: Secondary | ICD-10-CM | POA: Diagnosis not present

## 2022-02-11 DIAGNOSIS — F0390 Unspecified dementia without behavioral disturbance: Secondary | ICD-10-CM | POA: Diagnosis not present

## 2022-02-11 DIAGNOSIS — M86672 Other chronic osteomyelitis, left ankle and foot: Secondary | ICD-10-CM | POA: Diagnosis not present

## 2022-02-11 HISTORY — PX: AMPUTATION TOE: SHX6595

## 2022-02-11 LAB — BASIC METABOLIC PANEL
Anion gap: 8 (ref 5–15)
BUN: 33 mg/dL — ABNORMAL HIGH (ref 8–23)
CO2: 21 mmol/L — ABNORMAL LOW (ref 22–32)
Calcium: 9 mg/dL (ref 8.9–10.3)
Chloride: 110 mmol/L (ref 98–111)
Creatinine, Ser: 1.01 mg/dL — ABNORMAL HIGH (ref 0.44–1.00)
GFR, Estimated: 55 mL/min — ABNORMAL LOW (ref >60)
Glucose, Bld: 163 mg/dL — ABNORMAL HIGH (ref 70–99)
Potassium: 4.1 mmol/L (ref 3.5–5.1)
Sodium: 139 mmol/L (ref 135–145)

## 2022-02-11 LAB — GLUCOSE, CAPILLARY
Glucose-Capillary: 173 mg/dL — ABNORMAL HIGH (ref 70–99)
Glucose-Capillary: 152 mg/dL — ABNORMAL HIGH (ref 70–99)
Glucose-Capillary: 192 mg/dL — ABNORMAL HIGH (ref 70–99)
Glucose-Capillary: 217 mg/dL — ABNORMAL HIGH (ref 70–99)

## 2022-02-11 LAB — CBC
HCT: 34.9 % — ABNORMAL LOW (ref 36.0–46.0)
Hemoglobin: 11.1 g/dL — ABNORMAL LOW (ref 12.0–15.0)
MCH: 28.5 pg (ref 26.0–34.0)
MCHC: 31.8 g/dL (ref 30.0–36.0)
MCV: 89.5 fL (ref 80.0–100.0)
Platelets: 315 10*3/uL (ref 150–400)
RBC: 3.9 MIL/uL (ref 3.87–5.11)
RDW: 14.3 % (ref 11.5–15.5)
WBC: 8.9 10*3/uL (ref 4.0–10.5)
nRBC: 0 % (ref 0.0–0.2)

## 2022-02-11 SURGERY — AMPUTATION, TOE
Anesthesia: General | Site: Toe | Laterality: Left

## 2022-02-11 MED ORDER — KETAMINE HCL 10 MG/ML IJ SOLN
INTRAMUSCULAR | Status: DC | PRN
  Administered 2022-02-11: 10 mg via INTRAVENOUS

## 2022-02-11 MED ORDER — POVIDONE-IODINE 10 % EX SWAB: 2.0000 "application " | Freq: Once | CUTANEOUS | Status: DC

## 2022-02-11 MED ORDER — LABETALOL HCL 5 MG/ML IV SOLN
5.0000 mg | INTRAVENOUS | Status: DC | PRN
  Administered 2022-02-11: 5 mg via INTRAVENOUS

## 2022-02-11 MED ORDER — BUPIVACAINE HCL 0.5 % IJ SOLN
INTRAMUSCULAR | Status: DC | PRN
  Administered 2022-02-11: 10 mL

## 2022-02-11 MED ORDER — 0.9 % SODIUM CHLORIDE (POUR BTL) OPTIME
TOPICAL | Status: DC | PRN
  Administered 2022-02-11: 1000 mL

## 2022-02-11 MED ORDER — PROPOFOL 500 MG/50ML IV EMUL
INTRAVENOUS | Status: AC
  Filled 2022-02-11: qty 50

## 2022-02-11 MED ORDER — LABETALOL HCL 5 MG/ML IV SOLN
INTRAVENOUS | Status: AC
  Filled 2022-02-11: qty 4

## 2022-02-11 MED ORDER — FENTANYL CITRATE (PF) 100 MCG/2ML IJ SOLN: 25.0000 ug | INTRAMUSCULAR | Status: DC | PRN

## 2022-02-11 MED ORDER — KETAMINE HCL 50 MG/5ML IJ SOSY
PREFILLED_SYRINGE | INTRAMUSCULAR | Status: AC
  Filled 2022-02-11: qty 5

## 2022-02-11 MED ORDER — LIDOCAINE HCL (PF) 2 % IJ SOLN
INTRAMUSCULAR | Status: AC
  Filled 2022-02-11: qty 5

## 2022-02-11 MED ORDER — PROPOFOL 10 MG/ML IV BOLUS
INTRAVENOUS | Status: DC | PRN
  Administered 2022-02-11: 20 mg via INTRAVENOUS
  Administered 2022-02-11: 40 mg via INTRAVENOUS
  Administered 2022-02-11 (×2): 20 mg via INTRAVENOUS

## 2022-02-11 MED ORDER — SODIUM CHLORIDE 0.9 % IV SOLN: INTRAVENOUS | Status: DC | PRN

## 2022-02-11 SURGICAL SUPPLY — 45 items
BLADE OSC/SAGITTAL MD 5.5X18 (BLADE) ×2 IMPLANT
BLADE SURG MINI STRL (BLADE) ×2 IMPLANT
BNDG CONFORM 2 STRL LF (GAUZE/BANDAGES/DRESSINGS) ×2 IMPLANT
BNDG CONFORM 3 STRL LF (GAUZE/BANDAGES/DRESSINGS) ×4 IMPLANT
BNDG ELASTIC 4X5.8 VLCR NS LF (GAUZE/BANDAGES/DRESSINGS) ×2 IMPLANT
BNDG ELASTIC 4X5.8 VLCR STR LF (GAUZE/BANDAGES/DRESSINGS) ×1 IMPLANT
BNDG ESMARK 4X12 TAN STRL LF (GAUZE/BANDAGES/DRESSINGS) ×2 IMPLANT
BNDG GAUZE ELAST 4 BULKY (GAUZE/BANDAGES/DRESSINGS) ×2 IMPLANT
CUFF TOURN SGL QUICK 12 (TOURNIQUET CUFF) IMPLANT
CUFF TOURN SGL QUICK 18X4 (TOURNIQUET CUFF) IMPLANT
DRAPE FLUOR MINI C-ARM 54X84 (DRAPES) ×2 IMPLANT
DRAPE XRAY CASSETTE 23X24 (DRAPES) ×2 IMPLANT
DURAPREP 26ML APPLICATOR (WOUND CARE) ×2 IMPLANT
ELECT REM PT RETURN 9FT ADLT (ELECTROSURGICAL) ×2
ELECTRODE REM PT RTRN 9FT ADLT (ELECTROSURGICAL) ×1 IMPLANT
GAUZE PACKING IODOFORM 1/2 (PACKING) ×2 IMPLANT
GAUZE SPONGE 4X4 12PLY STRL (GAUZE/BANDAGES/DRESSINGS) ×2 IMPLANT
GAUZE XEROFORM 1X8 LF (GAUZE/BANDAGES/DRESSINGS) ×2 IMPLANT
GLOVE SURG ENC MOIS LTX SZ7.5 (GLOVE) ×2 IMPLANT
GLOVE SURG UNDER LTX SZ8 (GLOVE) ×2 IMPLANT
GOWN STRL REUS W/ TWL XL LVL3 (GOWN DISPOSABLE) ×2 IMPLANT
GOWN STRL REUS W/TWL XL LVL3 (GOWN DISPOSABLE) ×4
IV NS IRRIG 3000ML ARTHROMATIC (IV SOLUTION) ×2 IMPLANT
KIT TURNOVER KIT A (KITS) ×2 IMPLANT
LABEL OR SOLS (LABEL) ×2 IMPLANT
MANIFOLD NEPTUNE II (INSTRUMENTS) ×2 IMPLANT
NDL FILTER BLUNT 18X1 1/2 (NEEDLE) ×1 IMPLANT
NDL HYPO 25X1 1.5 SAFETY (NEEDLE) ×1 IMPLANT
NEEDLE FILTER BLUNT 18X 1/2SAF (NEEDLE) ×1
NEEDLE FILTER BLUNT 18X1 1/2 (NEEDLE) ×1 IMPLANT
NEEDLE HYPO 25X1 1.5 SAFETY (NEEDLE) ×2 IMPLANT
NS IRRIG 500ML POUR BTL (IV SOLUTION) ×2 IMPLANT
PACK EXTREMITY ARMC (MISCELLANEOUS) ×2 IMPLANT
PAD ABD DERMACEA PRESS 5X9 (GAUZE/BANDAGES/DRESSINGS) ×3 IMPLANT
PULSAVAC PLUS IRRIG FAN TIP (DISPOSABLE) ×2
SHIELD FULL FACE ANTIFOG 7M (MISCELLANEOUS) ×2 IMPLANT
STOCKINETTE M/LG 89821 (MISCELLANEOUS) ×2 IMPLANT
STRAP SAFETY 5IN WIDE (MISCELLANEOUS) ×2 IMPLANT
SUT ETHILON 3-0 FS-10 30 BLK (SUTURE) ×2
SUT ETHILON 5-0 FS-2 18 BLK (SUTURE) ×2 IMPLANT
SUT VIC AB 4-0 FS2 27 (SUTURE) ×2 IMPLANT
SUTURE EHLN 3-0 FS-10 30 BLK (SUTURE) ×1 IMPLANT
SYR 10ML LL (SYRINGE) ×6 IMPLANT
TIP FAN IRRIG PULSAVAC PLUS (DISPOSABLE) ×1 IMPLANT
WATER STERILE IRR 500ML POUR (IV SOLUTION) ×2 IMPLANT

## 2022-02-11 NOTE — Transfer of Care (Signed)
Immediate Anesthesia Transfer of Care Note ? ?Patient: Beth Mcguire ? ?Procedure(s) Performed: Left Second Toe Amputation (Left: Toe) ? ?Patient Location: PACU ? ?Anesthesia Type:General ? ?Level of Consciousness: drowsy ? ?Airway & Oxygen Therapy: Patient Spontanous Breathing and Patient connected to face mask oxygen ? ?Post-op Assessment: Report given to RN, Post -op Vital signs reviewed and stable and Patient moving all extremities ? ?Post vital signs: Reviewed and stable ? ?Last Vitals:  ?Vitals Value Taken Time  ?BP 163/55 02/11/22 1000  ?Temp    ?Pulse 64 02/11/22 1003  ?Resp 16 02/11/22 1003  ?SpO2 100 % 02/11/22 1003  ?Vitals shown include unvalidated device data. ? ?Last Pain:  ?Vitals:  ? 02/11/22 0730  ?TempSrc:   ?PainSc: 0-No pain  ?   ? ?  ? ?Complications: No notable events documented. ?

## 2022-02-11 NOTE — Op Note (Signed)
Operative note ? ? Surgeon:Kwinton Maahs Vickki Muff ? ?  Assistant: None ? ?  Preop diagnosis: Osteomyelitis distal left second toe ? ?  Postop diagnosis: Same ? ?  Procedure: Amputation left second toe through proximal phalanx ? ?  EBL: Minimal ? ?  Anesthesia:local and IV sedation ? ?  Hemostasis: None ? ?  Specimen: Bone for pathology and bone for culture ? ?  Complications: None ? ?  Operative indications:Beth Mcguire is an 81 y.o. that presents today for surgical intervention.  The risks/benefits/alternatives/complications have been discussed and consent has been given. ? ?  Procedure:  ?Patient was brought into the OR and placed on the operating table in thesupine position. After anesthesia was obtained theleft lower extremity was prepped and draped in usual sterile fashion. ? ?Attention was directed to the distal aspect of the left second toe where 2 semielliptical incisions were placed from dorsal to plantar.  Full-thickness flaps were created down to the level of the midshaft of the proximal phalanx.  At the PIPJ the toe was disarticulated and sent for pathological examination.  A small portion of the distal phalanx was removed and sent for bone culture.  At this time a osteotomy was created through the midshaft of the proximal phalanx.  The wound was flushed with copious amounts of irrigation.  Bleeders were Bovie cauterized.  Closure was performed with a 3-0 nylon.  Bulky sterile dressing was applied. ? ?  Patient tolerated the procedure and anesthesia well.  Was transported from the OR to the PACU with all vital signs stable and vascular status intact. To be discharged per routine protocol.  Will follow up in approximately 1 week in the outpatient clinic. ? ?

## 2022-02-11 NOTE — Anesthesia Postprocedure Evaluation (Signed)
Anesthesia Post Note ? ?Patient: Beth Mcguire ? ?Procedure(s) Performed: Left Second Toe Amputation (Left: Toe) ? ?Patient location during evaluation: PACU ?Anesthesia Type: General ?Level of consciousness: awake and alert ?Pain management: pain level controlled ?Vital Signs Assessment: post-procedure vital signs reviewed and stable ?Respiratory status: spontaneous breathing, nonlabored ventilation, respiratory function stable and patient connected to nasal cannula oxygen ?Cardiovascular status: blood pressure returned to baseline and stable ?Postop Assessment: no apparent nausea or vomiting ?Anesthetic complications: no ? ? ?No notable events documented. ? ? ?Last Vitals:  ?Vitals:  ? 02/11/22 1041 02/11/22 1107  ?BP: (!) 175/57 (!) 168/53  ?Pulse: (!) 56 61  ?Resp: 14 15  ?Temp: 36.9 ?C 36.5 ?C  ?SpO2: 97% 100%  ?  ?Last Pain:  ?Vitals:  ? 02/11/22 1107  ?TempSrc: Oral  ?PainSc:   ? ? ?  ?  ?  ?  ?  ?  ? ?Martha Clan ? ? ? ? ?

## 2022-02-11 NOTE — Plan of Care (Signed)

## 2022-02-11 NOTE — Progress Notes (Signed)
?PROGRESS NOTE ? ? ? ?Beth Mcguire  NWG:[id number] DOB: 10/18/1940 DOA: 02/09/2022 ?PCP: [name], MD  ? ?Assessment & Plan: ?  ?Principal Problem: ?  Diabetic foot ulcer ([place]) ?Active Problems: ?  Osteomyelitis ([place]) ? ?Assessment and Plan: ? 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 Continue on IV vanco, cefepime.  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 Norco, morphine prn for pain. No dressing changes needed upon d/c as per podiatry.  Pt can ambulate in postop shoe. F/u outpatient w/ podiatry in 1 week.  ? ?DM2: HbA1c 6.7, pretty well controlled. Continue on SSI w/ accuchecks  ? ?Hx of PVD: can restart aspirin, plavix when ok w/ podiatry  ?  ?HTN: continue on lisinopril  ?  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 Currently stage IIIa. Cr is labile  ? ?HLD: continue on statin ?  ?Dementia: continue on home dose of seroquel. Continue w/ supportive care ? ? ? ? ?DVT prophylaxis: lovenox ?Code Status:  full  ?Family Communication:  ?Disposition Plan:  depends on PT/OT recs  ? ?Level of care: Med-Surg ? ?Status is: Inpatient ?Remains inpatient appropriate because: severity of illness ? ? ? ? ? ?Consultants:  ?Podiatry  ? ?Procedures:  ? ?Antimicrobials: vanco, cefepime  ? ? ?Subjective: ?Pt c/o missing her husband and family  ? ?Objective: ?Vitals:  ? 02/10/22 1000 02/10/22 1650 02/10/22 1948 02/11/22 0507  ?BP: (!) 150/48 (!) 169/61 (!) 173/58 (!) 156/57  ?Pulse: 80 79 73 74  ?Resp:  '18 18 16  '$ ?Temp:  98 ?F (36.7 ?C) 98.5 ?F (36.9 ?C) 97.9 ?F (36.6 ?C)  ?TempSrc:   Oral   ?SpO2:  96% 98% 98%  ?Weight:      ?Height:      ? ? ?Intake/Output Summary (Last 24 hours) at 02/11/2022 0723 ?Last data filed at 02/11/2022 [phone] ?Gross per 24 hour  ?Intake 1090 ml  ?Output 900 ml  ?Net 190 ml  ? ?Filed Weights  ? 02/09/22 1700  ?Weight: 68.9 kg  ? ? ?Examination: ? ?General exam: Appears anxious and confused  ?Respiratory system: Clear breath sounds b/l  ?Cardiovascular system: S1/S2+. No rubs or  clicks  ?Gastrointestinal system: Abd is soft, NT, ND 7 hypoactive bowel sounds  ?Central nervous system: Alert and awake. Moves all extremities  ?Psychiatry: judgement and insight appears poor. Anxious mood and affect  ? ? ? ?Data Reviewed: I have personally reviewed following labs and imaging studies ? ?CBC: ?Recent Labs  ?Lab 02/09/22 ?1407 02/10/22 ?6962 02/11/22 ?0615  ?WBC 9.1 8.9 8.9  ?NEUTROABS 5.8  --   --   ?HGB 11.2* 10.8* 11.1*  ?HCT 36.2 34.6* 34.9*  ?MCV 91.2 89.2 89.5  ?PLT 350 334 315  ? ?Basic Metabolic Panel: ?Recent Labs  ?Lab 02/09/22 ?1407 02/10/22 ?9528 02/11/22 ?0615  ?NA 139 138 139  ?K 4.8 4.4 4.1  ?CL 106 109 110  ?CO2 25 24 21*  ?GLUCOSE 171* 161* 163*  ?BUN 29* 32* 33*  ?CREATININE 1.15* 0.94 1.01*  ?CALCIUM 9.0 8.9 9.0  ? ?GFR: ?Estimated Creatinine Clearance: 39.3 mL/min (A) (by C-G formula based on SCr of 1.01 mg/dL (H)). ?Liver Function Tests: ?Recent Labs  ?Lab 02/09/22 ?1407  ?AST 13*  ?ALT 13  ?ALKPHOS 144*  ?BILITOT 0.5  ?PROT 7.0  ?ALBUMIN 3.6  ? ?No results for input(s): LIPASE, AMYLASE in the last 168 hours. ?No results for input(s): AMMONIA in the last 168 hours. ?Coagulation Profile: ?No results for input(s):  INR, PROTIME in the last 168 hours. ?Cardiac Enzymes: ?No results for input(s): CKTOTAL, CKMB, CKMBINDEX, TROPONINI in the last 168 hours. ?BNP (last 3 results) ?No results for input(s): PROBNP in the last 8760 hours. ?HbA1C: ?No results for input(s): HGBA1C in the last 72 hours. ?CBG: ?Recent Labs  ?Lab 02/09/22 ?1957 02/10/22 ?2947 02/10/22 ?1245 02/10/22 ?1648 02/10/22 ?2101  ?GLUCAP 120* 162* 200* 242* 220*  ? ?Lipid Profile: ?No results for input(s): CHOL, HDL, LDLCALC, TRIG, CHOLHDL, LDLDIRECT in the last 72 hours. ?Thyroid Function Tests: ?No results for input(s): TSH, T4TOTAL, FREET4, T3FREE, THYROIDAB in the last 72 hours. ?Anemia Panel: ?No results for input(s): VITAMINB12, FOLATE, FERRITIN, TIBC, IRON, RETICCTPCT in the last 72 hours. ?Sepsis Labs: ?No results  for input(s): PROCALCITON, LATICACIDVEN in the last 168 hours. ? ?Recent Results (from the past 240 hour(s))  ?Culture, blood (routine x 2)     Status: None (Preliminary result)  ? Collection Time: 02/09/22  7:17 PM  ? Specimen: BLOOD  ?Result Value Ref Range Status  ? Specimen Description BLOOD RIGHT ANTECUBITAL  Final  ? Special Requests   Final  ?  BOTTLES DRAWN AEROBIC AND ANAEROBIC Blood Culture adequate volume  ? Culture   Final  ?  NO GROWTH < 12 HOURS ?Performed at [facility], [address], [place], [place] [address] ?  ? Report Status PENDING  Incomplete  ?Culture, blood (routine x 2)     Status: None (Preliminary result)  ? Collection Time: 02/09/22  7:23 PM  ? Specimen: BLOOD  ?Result Value Ref Range Status  ? Specimen Description BLOOD BLOOD RIGHT HAND  Final  ? Special Requests   Final  ?  BOTTLES DRAWN AEROBIC AND ANAEROBIC Blood Culture adequate volume  ? Culture   Final  ?  NO GROWTH < 12 HOURS ?Performed at [facility], [address], [place], [place] [address] ?  ? Report Status PENDING  Incomplete  ?Surgical pcr screen     Status: None  ? Collection Time: 02/10/22  4:30 PM  ? Specimen: Nasal Mucosa; Nasal Swab  ?Result Value Ref Range Status  ? MRSA, PCR NEGATIVE NEGATIVE Final  ? Staphylococcus aureus NEGATIVE NEGATIVE Final  ?  Comment: (NOTE) ?The Xpert SA Assay (FDA approved for NASAL specimens in patients 45 ?years of age and older), is one component of a comprehensive ?surveillance program. It is not intended to diagnose infection nor to ?guide or monitor treatment. ?Performed at [facility], [place], ?[place] [address] ?  ?  ? ? ? ? ? ?Radiology Studies: ?US ARTERIAL ABI (SCREENING LOWER EXTREMITY) ? ?Result Date: 02/10/2022 ?CLINICAL DATA:  Left second toe wound EXAM: NONINVASIVE PHYSIOLOGIC VASCULAR STUDY OF BILATERAL LOWER EXTREMITIES TECHNIQUE: Evaluation of both lower extremities were performed at rest, including calculation of  ankle-brachial indices with single level Doppler, pressure and pulse volume recording. COMPARISON:  None available FINDINGS: Right ABI:  0.57 Left ABI:  0.66 Right Lower Extremity: Monophasic posterior tibial and dorsalis pedis waveforms. Left Lower Extremity: Monophasic posterior tibial and dorsalis pedis waveforms. 0.5-0.79 Moderate PAD IMPRESSION: Moderate bilateral PAD, worse on the right. Electronically Signed   By: Miachel Roux M.D.   On: 02/10/2022 14:39  ? ?DG Foot Complete Left ? ?Result Date: 02/09/2022 ?CLINICAL DATA:  Wound. Second toe pain. History of diabetes. Peripheral vascular disease. EXAM: LEFT FOOT - COMPLETE 3+ VIEW COMPARISON:  None Available. FINDINGS: There is diffuse decreased bone mineralization. Postsurgical changes are seen of amputation  of the first ray to the level of the proximal to mid metatarsal shaft. There is a sharp border of the resection line. Mild-to-moderate plantar calcaneal heel spur. Minimal chronic spurring at the Achilles insertion on the calcaneus. Mild talonavicular, navicular-cuneiform and tarsometatarsal joint space narrowing and dorsal degenerative osteophytes. There appears to be a soft tissue wound at the distal aspect of the second toe. There is mild erosion of the distal aspect of the distal phalanx of second toe suspicious for acute osteomyelitis. Mild bone fragmentation of this region on lateral view. IMPRESSION: Soft tissue wound of the distal aspect of second toe with mild cortical erosion of the distal aspect of the distal phalanx of second toe concerning for acute osteomyelitis. Electronically Signed   By: Yvonne Kendall M.D.   On: 02/09/2022 14:30   ? ? ? ? ? ?Scheduled Meds: ? atorvastatin  10 mg Oral Daily  ? enoxaparin (LOVENOX) injection  40 mg Subcutaneous Q24H  ? insulin aspart  0-6 Units Subcutaneous TID WC  ? lisinopril  20 mg Oral Daily  ? QUEtiapine  25 mg Oral QHS  ? ?Continuous Infusions: ? ceFEPime (MAXIPIME) IV 2 g (02/11/22 0522)  ? vancomycin  750 mg (02/10/22 1256)  ? ? ? LOS: 1 day  ? ? ?Time spent: 25 mins ? ? ? ?Wyvonnia Dusky, MD ?[facility] ?Pager [phone] ? ?If 7PM-7AM, please contact night-coverage ?02/11/2022, 7:23 AM  ? ?

## 2022-02-11 NOTE — Anesthesia Preprocedure Evaluation (Signed)
Anesthesia Evaluation  ?Patient identified by MRN, date of birth, ID band ?Patient awake ? ? ? ?Reviewed: ?Allergy & Precautions, NPO status , Patient's Chart, lab work & pertinent test results ? ?Airway ?Mallampati: III ? ?TM Distance: >3 FB ?Neck ROM: full ? ? ? Dental ? ?(+) Chipped, Poor Dentition, Dental Advidsory Given ?  ?Pulmonary ?neg shortness of breath, pneumonia (Covid + 11/15/2021. Original infection was 10/16/2021), resolved, neg recent URI,  ?  ?Pulmonary exam normal ? ? ? ? ? ? ? Cardiovascular ?Exercise Tolerance: Poor ?hypertension, Pt. on medications ?(-) angina+ Peripheral Vascular Disease  ?Normal cardiovascular exam ? ?EKG 11/14/21: ?Sinus or ectopic atrial rhythm ?Short PR interval ?Left ventricular hypertrophy ? ?3 weeks ago, patient underwent peripheral catheterization, and stent placed in in the left SFA. ?  ?Neuro/Psych ?PSYCHIATRIC DISORDERS Anxiety Dementia negative neurological ROS ?   ? GI/Hepatic ?negative GI ROS, Neg liver ROS,   ?Endo/Other  ?diabetes, Type 2, Insulin Dependent ? Renal/GU ?CRFRenal disease  ?negative genitourinary ?  ?Musculoskeletal ? ? Abdominal ?(+) + obese,   ?Peds ? Hematology ? ?(+) Blood dyscrasia, anemia ,   ?Anesthesia Other Findings ?Osteomyelitis left foot ? ?Past Medical History: ?No date: Anxiety ?No date: Colon cancer [facility]) ?2022: Dementia ([place]) ?No date: Diabetes [facility]) ?No date: Hypertension ?No date: Peripheral vascular disease ([place]) ? ?Past Surgical History: ?01/14/2016: AMPUTATION; Right ?    Comment:  Procedure: AMPUTATION RAY;  Surgeon: Albertine Patricia,  ?             DPM;  Location: [facility] ORS;  Service: Podiatry;   ?             Laterality: Right; ?No date: COLON SURGERY ?08/14/2017: LOWER EXTREMITY ANGIOGRAPHY; Left ?    Comment:  Procedure: Lower Extremity Angiography;  Surgeon: Lucky Cowboy,  ?             Erskine Squibb, MD;  Location: [place] [facility];  Service:  ?             Cardiovascular;  Laterality: Left; ?11/06/2021: LOWER  EXTREMITY ANGIOGRAPHY; Left ?    Comment:  Procedure: LOWER EXTREMITY ANGIOGRAPHY;  Surgeon: Lucky Cowboy,  ?             Erskine Squibb, MD;  Location: [place] [facility];  Service:  ?             Cardiovascular;  Laterality: Left; ?01/05/2016: PERIPHERAL VASCULAR CATHETERIZATION; N/A ?    Comment:  Procedure: Lower Extremity Angiography;  Surgeon: Corene Cornea  ?             Bunnie Domino, MD;  Location: [place] [facility];  Service:  ?             Cardiovascular;  Laterality: N/A; ?01/05/2016: PERIPHERAL VASCULAR CATHETERIZATION ?    Comment:  Procedure: Lower Extremity Intervention;  Surgeon: Corene Cornea ?             Bunnie Domino, MD;  Location: [place] [facility];  Service:  ?             Cardiovascular;; ?06/25/2016: PERIPHERAL VASCULAR CATHETERIZATION; Right ?    Comment:  Procedure: Lower Extremity Angiography;  Surgeon: Corene Cornea  ?             Bunnie Domino, MD;  Location: [place] [facility];  Service:  ?             Cardiovascular;  Laterality: Right; ?06/25/2016: PERIPHERAL VASCULAR CATHETERIZATION ?  Comment:  Procedure: Lower Extremity Intervention;  Surgeon: Corene Cornea ?             Bunnie Domino, MD;  Location: [place] [facility];  Service:  ?             Cardiovascular;; ?No date: TONSILLECTOMY ? ? ? ? Reproductive/Obstetrics ?negative OB ROS ? ?  ? ? ? ? ? ? ? ? ? ? ? ? ? ?  ?  ? ? ? ? ? ? ? ? ?Anesthesia Physical ? ?Anesthesia Plan ? ?ASA: 3 ? ?Anesthesia Plan: General  ? ?Post-op Pain Management:   ? ?Induction: Intravenous ? ?PONV Risk Score and Plan: 2 and TIVA, Treatment may vary due to age or medical condition and Propofol infusion ? ?Airway Management Planned: Natural Airway and Nasal Cannula ? ?Additional Equipment:  ? ?Intra-op Plan:  ? ?Post-operative Plan:  ? ?Informed Consent: I have reviewed the patients History and Physical, chart, labs and discussed the procedure including the risks, benefits and alternatives for the proposed anesthesia with the patient or authorized representative who has indicated his/her understanding and acceptance.   ? ? ? ?Dental advisory given ? ?Plan Discussed with: Anesthesiologist, CRNA and Surgeon ? ?Anesthesia Plan Comments:   ? ? ? ? ? ? ?Anesthesia Quick Evaluation ? ?

## 2022-02-11 NOTE — Plan of Care (Signed)
?  Problem: Education: ?Goal: Knowledge of General Education information will improve ?Description: Including pain rating scale, medication(s)/side effects and non-pharmacologic comfort measures ?02/11/2022 0256 by Jule Ser, RN ?Outcome: Progressing ?02/11/2022 0123 by Jule Ser, RN ?Outcome: Progressing ?  ?Problem: Health Behavior/Discharge Planning: ?Goal: Ability to manage health-related needs will improve ?02/11/2022 0256 by Jule Ser, RN ?Outcome: Progressing ?02/11/2022 0123 by Jule Ser, RN ?Outcome: Progressing ?  ?Problem: Clinical Measurements: ?Goal: Ability to maintain clinical measurements within normal limits will improve ?02/11/2022 0256 by Jule Ser, RN ?Outcome: Progressing ?02/11/2022 0123 by Jule Ser, RN ?Outcome: Progressing ?Goal: Will remain free from infection ?02/11/2022 0256 by Jule Ser, RN ?Outcome: Progressing ?02/11/2022 0123 by Jule Ser, RN ?Outcome: Progressing ?Goal: Diagnostic test results will improve ?02/11/2022 0256 by Jule Ser, RN ?Outcome: Progressing ?02/11/2022 0123 by Jule Ser, RN ?Outcome: Progressing ?Goal: Respiratory complications will improve ?02/11/2022 0256 by Jule Ser, RN ?Outcome: Progressing ?02/11/2022 0123 by Jule Ser, RN ?Outcome: Progressing ?Goal: Cardiovascular complication will be avoided ?02/11/2022 0256 by Jule Ser, RN ?Outcome: Progressing ?02/11/2022 0123 by Jule Ser, RN ?Outcome: Progressing ?  ?Problem: Activity: ?Goal: Risk for activity intolerance will decrease ?02/11/2022 0256 by Jule Ser, RN ?Outcome: Progressing ?02/11/2022 0123 by Jule Ser, RN ?Outcome: Progressing ?  ?Problem: Nutrition: ?Goal: Adequate nutrition will be maintained ?02/11/2022 0256 by Jule Ser, RN ?Outcome: Progressing ?02/11/2022 0123 by Jule Ser, RN ?Outcome: Progressing ?  ?Problem: Coping: ?Goal: Level of anxiety will decrease ?02/11/2022 0256 by Jule Ser, RN ?Outcome:  Progressing ?02/11/2022 0123 by Jule Ser, RN ?Outcome: Progressing ?  ?Problem: Elimination: ?Goal: Will not experience complications related to bowel motility ?02/11/2022 0256 by Jule Ser, RN ?Outcome: Progressing ?02/11/2022 0123 by Jule Ser, RN ?Outcome: Progressing ?Goal: Will not experience complications related to urinary retention ?02/11/2022 0256 by Jule Ser, RN ?Outcome: Progressing ?02/11/2022 0123 by Jule Ser, RN ?Outcome: Progressing ?  ?Problem: Pain Managment: ?Goal: General experience of comfort will improve ?02/11/2022 0256 by Jule Ser, RN ?Outcome: Progressing ?02/11/2022 0123 by Jule Ser, RN ?Outcome: Progressing ?  ?Problem: Safety: ?Goal: Ability to remain free from injury will improve ?02/11/2022 0256 by Jule Ser, RN ?Outcome: Progressing ?02/11/2022 0123 by Jule Ser, RN ?Outcome: Progressing ?  ?Problem: Skin Integrity: ?Goal: Risk for impaired skin integrity will decrease ?02/11/2022 0256 by Jule Ser, RN ?Outcome: Progressing ?02/11/2022 0123 by Jule Ser, RN ?Outcome: Progressing ?  ?

## 2022-02-11 NOTE — Progress Notes (Signed)
Daily Progress Note ? ? ?Subjective  - Day of Surgery ? ?Patient underwent distal left second toe amputation today.  No complications Intra-Op ? ?Objective ?Vitals:  ? 02/10/22 1000 02/10/22 1650 02/10/22 1948 02/11/22 0507  ?BP: (!) 150/48 (!) 169/61 (!) 173/58 (!) 156/57  ?Pulse: 80 79 73 74  ?Resp:  '18 18 16  '$ ?Temp:  98 ?F (36.7 ?C) 98.5 ?F (36.9 ?C) 97.9 ?F (36.6 ?C)  ?TempSrc:   Oral   ?SpO2:  96% 98% 98%  ?Weight:      ?Height:      ? ? ?Physical Exam: ?At the level of the amputation site the bone was stable.  No signs of residual infection proximal. ? ?Laboratory ?CBC ?   ?Component Value Date/Time  ? WBC 8.9 02/11/2022 0615  ? HGB 11.1 (L) 02/11/2022 0615  ? HGB 13.2 12/04/2013 0953  ? HCT 34.9 (L) 02/11/2022 0615  ? HCT 41.0 12/04/2013 0953  ? PLT 315 02/11/2022 0615  ? PLT 324 12/04/2013 0953  ? ? ?BMET ?   ?Component Value Date/Time  ? NA 139 02/11/2022 0615  ? NA 142 11/13/2011 1110  ? K 4.1 02/11/2022 0615  ? K 4.6 11/13/2011 1110  ? CL 110 02/11/2022 0615  ? CL 106 11/13/2011 1110  ? CO2 21 (L) 02/11/2022 0615  ? CO2 28 11/13/2011 1110  ? GLUCOSE 163 (H) 02/11/2022 0615  ? GLUCOSE 123 (H) 11/13/2011 1110  ? BUN 33 (H) 02/11/2022 0615  ? BUN 12 11/13/2011 1110  ? CREATININE 1.01 (H) 02/11/2022 0615  ? CREATININE 1.10 12/04/2013 0953  ? CALCIUM 9.0 02/11/2022 0615  ? CALCIUM 8.7 11/13/2011 1110  ? GFRNONAA 55 (L) 02/11/2022 0615  ? GFRNONAA 49 (L) 12/04/2013 0953  ? GFRAA 54 (L) 08/14/2017 0912  ? GFRAA 57 (L) 12/04/2013 0953  ? ? ?Assessment/Planning: ?Status post distal left second toe amputation for osteomyelitis ? ?Status post distal left second toe amputation.  This was a therapeutic amputation.  Patient would need oral antibiotics upon discharge. ?Patient can follow-up in the outpatient clinic in 1 week for dressing change.  No dressing changes needed upon discharge. ?Patient can ambulate in postop shoe as needed. ?From podiatry standpoint patient should be stable for discharge once medically  stable. ? ?Samara Deist A ? ?02/11/2022, 9:58 AM ? ?

## 2022-02-12 ENCOUNTER — Encounter: Payer: Self-pay | Admitting: Podiatry

## 2022-02-12 DIAGNOSIS — E1169 Type 2 diabetes mellitus with other specified complication: Secondary | ICD-10-CM | POA: Diagnosis not present

## 2022-02-12 DIAGNOSIS — F0390 Unspecified dementia without behavioral disturbance: Secondary | ICD-10-CM | POA: Diagnosis not present

## 2022-02-12 DIAGNOSIS — M86672 Other chronic osteomyelitis, left ankle and foot: Secondary | ICD-10-CM | POA: Diagnosis not present

## 2022-02-12 LAB — BASIC METABOLIC PANEL
Anion gap: 6 (ref 5–15)
BUN: 32 mg/dL — ABNORMAL HIGH (ref 8–23)
CO2: 20 mmol/L — ABNORMAL LOW (ref 22–32)
Calcium: 9.2 mg/dL (ref 8.9–10.3)
Chloride: 111 mmol/L (ref 98–111)
Creatinine, Ser: 0.95 mg/dL (ref 0.44–1.00)
GFR, Estimated: 59 mL/min — ABNORMAL LOW (ref >60)
Glucose, Bld: 193 mg/dL — ABNORMAL HIGH (ref 70–99)
Potassium: 4.2 mmol/L (ref 3.5–5.1)
Sodium: 137 mmol/L (ref 135–145)

## 2022-02-12 LAB — CBC
HCT: 37.2 % (ref 36.0–46.0)
Hemoglobin: 11.6 g/dL — ABNORMAL LOW (ref 12.0–15.0)
MCH: 27.8 pg (ref 26.0–34.0)
MCHC: 31.2 g/dL (ref 30.0–36.0)
MCV: 89 fL (ref 80.0–100.0)
Platelets: 320 10*3/uL (ref 150–400)
RBC: 4.18 MIL/uL (ref 3.87–5.11)
RDW: 14 % (ref 11.5–15.5)
WBC: 9.3 10*3/uL (ref 4.0–10.5)
nRBC: 0 % (ref 0.0–0.2)

## 2022-02-12 LAB — GLUCOSE, CAPILLARY
Glucose-Capillary: 168 mg/dL — ABNORMAL HIGH (ref 70–99)
Glucose-Capillary: 193 mg/dL — ABNORMAL HIGH (ref 70–99)
Glucose-Capillary: 225 mg/dL — ABNORMAL HIGH (ref 70–99)
Glucose-Capillary: 350 mg/dL — ABNORMAL HIGH (ref 70–99)
Glucose-Capillary: 325 mg/dL — ABNORMAL HIGH (ref 70–99)

## 2022-02-12 MED ORDER — CLOPIDOGREL BISULFATE 75 MG PO TABS
75.0000 mg | ORAL_TABLET | Freq: Every day | ORAL | Status: DC
  Administered 2022-02-12 – 2022-02-13 (×2): 75 mg via ORAL
  Filled 2022-02-12 (×2): qty 1

## 2022-02-12 MED ORDER — ASPIRIN EC 81 MG PO TBEC
81.0000 mg | DELAYED_RELEASE_TABLET | Freq: Every day | ORAL | Status: DC
Start: 2022-02-12 — End: 2022-02-13
  Administered 2022-02-12 – 2022-02-13 (×2): 81 mg via ORAL
  Filled 2022-02-12 (×2): qty 1

## 2022-02-12 NOTE — Evaluation (Signed)
Physical Therapy Evaluation ?Patient Details ?Name: Beth Mcguire ?MRN: [id number] ?DOB: 18-Jun-1941 ?Today's Date: 02/12/2022 ? ?History of Present Illness ? Pt is an 81 y.o. female who presents s/p L foot amputation. PmHx: DM, PVD, HTN, HLD, CKD, Colon Cancer, Anxiety. ?  ?Clinical Impression ? Patient alert and in bed with family at bedside, oriented x4. The family did endorse that the patient was more confused this morning than usual, some conversational confusion noted (pt attempted to order 3 soups for lunch). Denied any pain during session. Pt pt family and chart, pt with 24/7 assistance at home from family, independent for dressing, ambulatory with RW with extensive DME at home. ? ?The patient performed bed mobility modI, use of bed rails. Good sitting balance noted. Sit <> stand with RW and CGA from EOB, BSC and recliner, no LOB noted. Pt also able to perform pericare in standing with CGA, and briefs donned. She ambulated ~33f with RW and CGA, with decreased gait velocity, and decreased step length/height. Post op shoe donned for all mobility. Overall  the patient demonstrated near return to baseline level of function but would benefit from follow up therapy to maximize function, safety, and balance.    ?   ? ?Recommendations for follow up therapy are one component of a multi-disciplinary discharge planning process, led by the attending physician.  Recommendations may be updated based on patient status, additional functional criteria and insurance authorization. ? ?Follow Up Recommendations Home health PT ? ?  ?Assistance Recommended at Discharge Frequent or constant Supervision/Assistance  ?Patient can return home with the following ? A little help with bathing/dressing/bathroom;Assistance with cooking/housework;Assist for transportation;Help with stairs or ramp for entrance ? ?  ?Equipment Recommendations None recommended by PT  ?Recommendations for Other Services ?    ?  ?Functional Status Assessment  Patient has had a recent decline in their functional status and demonstrates the ability to make significant improvements in function in a reasonable and predictable amount of time.  ? ?  ?Precautions / Restrictions Precautions ?Precautions: Fall ?Required Braces or Orthoses: Other Brace ?Other Brace: post op shoe ?Restrictions ?Weight Bearing Restrictions: Yes ?LLE Weight Bearing: Weight bearing as tolerated (in post op shoe)  ? ?  ? ?Mobility ? Bed Mobility ?Overal bed mobility: Modified Independent ?  ?  ?  ?  ?  ?  ?  ?  ? ?Transfers ?Overall transfer level: Needs assistance ?Equipment used: Rolling walker (2 wheels) ?Transfers: Sit to/from Stand, Bed to chair/wheelchair/BSC ?Sit to Stand: Min guard, Supervision ?Stand pivot transfers: Min guard ?  ?  ?  ?  ?  ?  ? ?Ambulation/Gait ?Ambulation/Gait assistance: Min guard ?Gait Distance (Feet): 30 Feet ?Assistive device: Rolling walker (2 wheels) ?  ?  ?  ?  ?General Gait Details: ambulated in post op shoe and standard shoe for RLE. no LOB, pt with decreased velocity and some step length/height ? ?Stairs ?  ?  ?  ?  ?  ? ?Wheelchair Mobility ?  ? ?Modified Rankin (Stroke Patients Only) ?  ? ?  ? ?Balance Overall balance assessment: Needs assistance ?  ?Sitting balance-Leahy Scale: Good ?  ?  ?Standing balance support: Single extremity supported ?Standing balance-Leahy Scale: Fair ?Standing balance comment: pericare in standing, CGA ?  ?  ?  ?  ?  ?  ?  ?  ?  ?  ?  ?   ? ? ? ?Pertinent Vitals/Pain Pain Assessment ?Pain Assessment: Faces ?Faces Pain Scale: No hurt  ? ? ?  Home Living Family/patient expects to be discharged to:: Private residence ?Living Arrangements: Children ?Available Help at Discharge: Family;Available 24 hours/day ?Type of Home: House ?Home Access: Ramped entrance ?  ?  ?  ?Home Layout: One level ?Home Equipment: Conservation officer, nature (2 wheels);Wheelchair - manual;Shower seat - built in;BSC/3in1;Shower seat ?   ?  ?Prior Function Prior Level of Function  : Independent/Modified Independent ?  ?  ?  ?  ?  ?  ?Mobility Comments: ambulatory with RW ?ADLs Comments: pt dresses herself, family assists if needed. refuses bathing at home per daughter. ?  ? ? ?Hand Dominance  ?   ? ?  ?Extremity/Trunk Assessment  ? Upper Extremity Assessment ?Upper Extremity Assessment: Overall WFL for tasks assessed ?  ? ?Lower Extremity Assessment ?Lower Extremity Assessment: Generalized weakness ?  ? ?Cervical / Trunk Assessment ?Cervical / Trunk Assessment: Kyphotic  ?Communication  ? Communication: No difficulties  ?Cognition Arousal/Alertness: Awake/alert ?Behavior During Therapy: [facility] for tasks assessed/performed ?Overall Cognitive Status: Within Functional Limits for tasks assessed ?  ?  ?  ?  ?  ?  ?  ?  ?  ?  ?  ?  ?  ?  ?  ?  ?General Comments: pts daughter reported shes different than her baseline, more confused. pt oriented x4 ?  ?  ? ?  ?General Comments   ? ?  ?Exercises    ? ?Assessment/Plan  ?  ?PT Assessment Patient needs continued PT services  ?PT Problem List Decreased mobility;Decreased balance;Decreased activity tolerance ? ?   ?  ?PT Treatment Interventions DME instruction;Therapeutic exercise;Balance training;Gait training;Stair training;Neuromuscular re-education;Functional mobility training;Therapeutic activities;Patient/family education   ? ?PT Goals (Current goals can be found in the Care Plan section)  ?Acute Rehab PT Goals ?Patient Stated Goal: to go home ?PT Goal Formulation: With patient/family ?Time For Goal Achievement: 02/26/22 ?Potential to Achieve Goals: Good ? ?  ?Frequency Min 2X/week ?  ? ? ?Co-evaluation   ?  ?  ?  ?  ? ? ?  ?AM-PAC PT "6 Clicks" Mobility  ?Outcome Measure Help needed turning from your back to your side while in a flat bed without using bedrails?: None ?Help needed moving from lying on your back to sitting on the side of a flat bed without using bedrails?: None ?Help needed moving to and from a bed to a chair (including a wheelchair)?:  None ?Help needed standing up from a chair using your arms (e.g., wheelchair or bedside chair)?: None ?Help needed to walk in hospital room?: A Little ?Help needed climbing 3-5 steps with a railing? : A Little ?6 Click Score: 22 ? ?  ?End of Session Equipment Utilized During Treatment: Gait belt ?Activity Tolerance: Patient tolerated treatment well ?Patient left: in chair;with chair alarm set;with call bell/phone within reach;with family/visitor present ?Nurse Communication: Mobility status ?PT Visit Diagnosis: Other abnormalities of gait and mobility (R26.89) ?  ? ?Time: 1914-7829 ?PT Time Calculation (min) (ACUTE ONLY): 26 min ? ? ?Charges:   PT Evaluation ?$PT Eval Low Complexity: 1 Low ?PT Treatments ?$Therapeutic Activity: 8-22 mins ?  ?   ? ? ?Lieutenant Diego PT, DPT ?12:31 PM,02/12/22 ? ? ?

## 2022-02-12 NOTE — Progress Notes (Signed)
Inpatient Diabetes Program Recommendations ? ?AACE/ADA: New Consensus Statement on Inpatient Glycemic Control (2015) ? ?Target Ranges:  Prepandial:   less than 140 mg/dL ?     Peak postprandial:   less than 180 mg/dL (1-2 hours) ?     Critically ill patients:  140 - 180 mg/dL  ? ? Latest Reference Range & Units 02/12/22 07:44 02/12/22 11:50  ?Glucose-Capillary 70 - 99 mg/dL 193 (H) 225 (H)  ?(H): Data is abnormally high ? ? ? ?Home DM Meds: 70/30 Insulin 25 units BID ?       Metformin 500 mg Daily ? ?Current Orders: Novolog 0-6 units TID ? ? ? ? ?Note pt was NPO yesterday for Amputation of Toe ? ?CBGs elevated today ? ?MD- Please consider starting a small portion of pt's home dose of 70/30 Insulin: ? ?70/30 Insulin 8 units BID (1/3 total home dose) ? ? ? ?--Will follow patient during hospitalization-- ? ?Wyn Quaker RN, MSN, CDE ?Diabetes Coordinator ?Inpatient Glycemic Control Team ?Team Pager: [phone] (8a-5p) ? ? ? ?

## 2022-02-12 NOTE — Evaluation (Signed)
Occupational Therapy Evaluation ?Patient Details ?Name: Beth Mcguire ?MRN: [id number] ?DOB: May 27, 1941 ?Today's Date: 02/12/2022 ? ? ?History of Present Illness Pt is an 81 y.o. female who presents s/p L foot amputation. PmHx: DM, PVD, HTN, HLD, CKD, Colon Cancer, Anxiety.  ? ?Clinical Impression ?  ?Pt was seen for OT evaluation this date. Prior to hospital admission, pt was ambulating with a RW and family supervision/SBA, was able to complete toileting and dressing tasks with set up of items with familiar routine and placement of items. Dtr reports assist for all IADL tasks and pt refuses most bathing/showers. Currently pt demonstrates impairments in balance, activity tolerance, and strength as described below (See OT problem list) which functionally limit her ability to perform ADL/self-care tasks at baseline independence. Pt currently requires CGA for ADL transfers with VC for hand placement/RW mgt, MAX A to don post-op shoe, and MIN A for throughness with pericare in standing. Pt able to let go of the RW to don briefs after toileting. Decreased awareness that she had in fact had a small BM and needed to complete pericare afterwards but with VC she is able to complete. Pt would benefit from skilled OT services to address noted impairments and functional limitations (see below for any additional details) in order to maximize safety and independence while minimizing falls risk and caregiver burden. Upon hospital discharge, recommend HHOT to maximize pt safety and return to functional independence during meaningful occupations of daily life.   ? ?Recommendations for follow up therapy are one component of a multi-disciplinary discharge planning process, led by the attending physician.  Recommendations may be updated based on patient status, additional functional criteria and insurance authorization.  ? ?Follow Up Recommendations ? Home health OT  ?  ?Assistance Recommended at Discharge Frequent or constant  Supervision/Assistance  ?Patient can return home with the following A little help with walking and/or transfers;A little help with bathing/dressing/bathroom;Assistance with cooking/housework;Assist for transportation;Help with stairs or ramp for entrance;Direct supervision/assist for medications management ? ?  ?Functional Status Assessment ? Patient has had a recent decline in their functional status and demonstrates the ability to make significant improvements in function in a reasonable and predictable amount of time.  ?Equipment Recommendations ? None recommended by OT  ?  ?Recommendations for Other Services   ? ? ?  ?Precautions / Restrictions Precautions ?Precautions: Fall ?Required Braces or Orthoses: Other Brace ?Other Brace: post op shoe ?Restrictions ?Weight Bearing Restrictions: Yes ?LLE Weight Bearing: Weight bearing as tolerated ?Other Position/Activity Restrictions: in post-op shoe  ? ?  ? ?Mobility Bed Mobility ?Overal bed mobility: Modified Independent ?  ?  ?  ?  ?  ?  ?  ?  ? ?Transfers ?Overall transfer level: Needs assistance ?Equipment used: Rolling walker (2 wheels) ?Transfers: Sit to/from Stand, Bed to chair/wheelchair/BSC ?Sit to Stand: Min guard ?  ?  ?Step pivot transfers: Min guard ?  ?  ?General transfer comment: VC for hand placement, RW mgt ?  ? ?  ?Balance Overall balance assessment: Needs assistance ?Sitting-balance support: No upper extremity supported, Single extremity supported, Feet supported ?Sitting balance-Leahy Scale: Good ?  ?  ?Standing balance support: Single extremity supported, Bilateral upper extremity supported, No upper extremity supported, During functional activity, Reliant on assistive device for balance ?Standing balance-Leahy Scale: Fair ?Standing balance comment: able to complete pericare in standing with UE support on RW, able to use BUE to pull up brief in standing with CGA and no overt LOB, reliant on RW for  mobility ?  ?  ?  ?  ?  ?  ?  ?  ?  ?  ?  ?   ? ?ADL  either performed or assessed with clinical judgement  ? ?ADL Overall ADL's : Needs assistance/impaired ?  ?  ?  ?  ?  ?  ?  ?  ?  ?  ?  ?  ?  ?  ?  ?  ?  ?  ?  ?General ADL Comments: Pt requires CGA for ADL transfers, CGA in standing for clothing mgt and set up for pericare with MIN A for thoroughness, and MAX A to don post-op shoe while pt was seated.  ? ? ? ?Vision   ?   ?   ?Perception   ?  ?Praxis   ?  ? ?Pertinent Vitals/Pain Pain Assessment ?Pain Assessment: Faces ?Faces Pain Scale: No hurt ?Pain Intervention(s): Monitored during session  ? ? ? ?Hand Dominance   ?  ?Extremity/Trunk Assessment Upper Extremity Assessment ?Upper Extremity Assessment: Overall WFL for tasks assessed ?  ?Lower Extremity Assessment ?Lower Extremity Assessment: Generalized weakness ?  ?Cervical / Trunk Assessment ?Cervical / Trunk Assessment: Kyphotic ?  ?Communication Communication ?Communication: No difficulties ?  ?Cognition Arousal/Alertness: Awake/alert ?Behavior During Therapy: [facility] for tasks assessed/performed ?Overall Cognitive Status: Within Functional Limits for tasks assessed ?  ?  ?  ?  ?  ?  ?  ?  ?  ?  ?  ?  ?  ?  ?  ?  ?General Comments: grossly WFL for session, follows simple commands well; dtr reports some confusion noted at times ?  ?  ?General Comments    ? ?  ?Exercises   ?  ?Shoulder Instructions    ? ? ?Home Living Family/patient expects to be discharged to:: Private residence ?Living Arrangements: Children ?Available Help at Discharge: Family;Available 24 hours/day ?Type of Home: House ?Home Access: Ramped entrance ?  ?  ?Home Layout: One level ?  ?  ?Bathroom Shower/Tub: Walk-in shower ?  ?Bathroom Toilet: Handicapped height ?Bathroom Accessibility: Yes ?  ?Home Equipment: Conservation officer, nature (2 wheels);Wheelchair - manual;Shower seat - built in;BSC/3in1;Shower seat;Hand held shower head ?  ?  ?  ? ?  ?Prior Functioning/Environment Prior Level of Function : Independent/Modified Independent ?  ?  ?  ?  ?  ?  ?Mobility  Comments: ambulatory with RW and family there with her while walking ?ADLs Comments: pt dresses herself, family assists if needed. refuses bathing at home per daughter. Dtr assists with all meds, meals, cleaning, and son in law provides transportation ?  ? ?  ?  ?OT Problem List: Decreased strength;Decreased activity tolerance;Impaired balance (sitting and/or standing);Decreased knowledge of use of DME or AE;Decreased knowledge of precautions ?  ?   ?OT Treatment/Interventions: Self-care/ADL training;Therapeutic exercise;Therapeutic activities;DME and/or AE instruction;Patient/family education;Balance training  ?  ?OT Goals(Current goals can be found in the care plan section) Acute Rehab OT Goals ?Patient Stated Goal: go home ?OT Goal Formulation: With patient/family ?Time For Goal Achievement: 02/26/22 ?Potential to Achieve Goals: Good ?ADL Goals ?Pt Will Perform Lower Body Dressing: with modified independence;sit to/from stand ?Pt Will Transfer to Toilet: ambulating;with supervision;bedside commode (LRAD) ?Pt Will Perform Toileting - Clothing Manipulation and hygiene: with supervision;with set-up ?Additional ADL Goal #1: Pt/family will demo independence with post-op shoe mgt.  ?OT Frequency: Min 2X/week ?  ? ?Co-evaluation   ?  ?  ?  ?  ? ?  ?AM-PAC OT "6 Clicks"  Daily Activity     ?Outcome Measure Help from another person eating meals?: None ?Help from another person taking care of personal grooming?: A Little ?Help from another person toileting, which includes using toliet, bedpan, or urinal?: A Little ?Help from another person bathing (including washing, rinsing, drying)?: A Lot ?Help from another person to put on and taking off regular upper body clothing?: A Little ?Help from another person to put on and taking off regular lower body clothing?: A Lot ?6 Click Score: 17 ?  ?End of Session Equipment Utilized During Treatment: Rolling walker (2 wheels) ? ?Activity Tolerance: Patient tolerated treatment  well ?Patient left: in bed;with call bell/phone within reach;with bed alarm set;with family/visitor present ? ?OT Visit Diagnosis: Muscle weakness (generalized) (M62.81);Unsteadiness on feet (R26.81)  ?              ?T

## 2022-02-12 NOTE — Progress Notes (Signed)
1 Day Post-Op  ? ?Subjective/Chief Complaint: ?Patient seen.  No complaints. ? ? ?Objective: ?Vital signs in last 24 hours: ?Temp:  [97.9 ?F (36.6 ?C)-98.8 ?F (37.1 ?C)] 98.3 ?F (36.8 ?C) (05/08 0744) ?Pulse Rate:  [66-94] 75 (05/08 1104) ?Resp:  [16-19] 16 (05/08 1104) ?BP: (120-168)/(37-61) 135/45 (05/08 1104) ?SpO2:  [96 %-99 %] 98 % (05/08 1104) ?Last BM Date : 02/09/22 ? ?Intake/Output from previous day: ?05/07 0701 - 05/08 0700 ?In: 680 [P.O.:480; I.V.:200] ?Out: 2 [Blood:2] ?Intake/Output this shift: ?No intake/output data recorded. ? ?Bandage on the left foot is clean, dry, and intact.  Upon removal the incision is well coapted.  No drainage or sign of infection. ? ? ?Lab Results:  ?Recent Labs  ?  02/11/22 ?0615 02/12/22 ?9562  ?WBC 8.9 9.3  ?HGB 11.1* 11.6*  ?HCT 34.9* 37.2  ?PLT 315 320  ? ?BMET ?Recent Labs  ?  02/11/22 ?0615 02/12/22 ?0459  ?NA 139 137  ?K 4.1 4.2  ?CL 110 111  ?CO2 21* 20*  ?GLUCOSE 163* 193*  ?BUN 33* 32*  ?CREATININE 1.01* 0.95  ?CALCIUM 9.0 9.2  ? ?PT/INR ?No results for input(s): LABPROT, INR in the last 72 hours. ?ABG ?No results for input(s): PHART, HCO3 in the last 72 hours. ? ?Invalid input(s): PCO2, PO2 ? ?Studies/Results: ?No results found. ? ?Anti-infectives: ?Anti-infectives (From admission, onward)  ? ? Start     Dose/Rate Route Frequency Ordered Stop  ? 02/10/22 1200  vancomycin (VANCOREADY) IVPB 750 mg/150 mL       ? 750 mg ?150 mL/hr over 60 Minutes Intravenous Every 24 hours 02/09/22 1815    ? 02/10/22 0500  ceFEPIme (MAXIPIME) 2 g in sodium chloride 0.9 % 100 mL IVPB       ? 2 g ?200 mL/hr over 30 Minutes Intravenous Every 12 hours 02/09/22 1812    ? 02/10/22 0130  ceFEPIme (MAXIPIME) 1 g in sodium chloride 0.9 % 100 mL IVPB  Status:  Discontinued       ? 1 g ?200 mL/hr over 30 Minutes Intravenous Every 8 hours 02/09/22 1744 02/09/22 1812  ? 02/09/22 1815  vancomycin (VANCOREADY) IVPB 500 mg/100 mL       ? 500 mg ?100 mL/hr over 60 Minutes Intravenous  Once 02/09/22  1812 02/09/22 2003  ? 02/09/22 1815  ceFEPIme (MAXIPIME) 1 g in sodium chloride 0.9 % 100 mL IVPB       ? 1 g ?200 mL/hr over 30 Minutes Intravenous  Once 02/09/22 1812 02/11/22 0316  ? 02/09/22 1700  ceFEPIme (MAXIPIME) 1 g in sodium chloride 0.9 % 100 mL IVPB       ? 1 g ?200 mL/hr over 30 Minutes Intravenous  Once 02/09/22 1649 02/09/22 1727  ? 02/09/22 1700  vancomycin (VANCOCIN) IVPB 1000 mg/200 mL premix       ? 1,000 mg ?200 mL/hr over 60 Minutes Intravenous  Once 02/09/22 1649 02/09/22 1827  ? ?  ? ? ?Assessment/Plan: ?s/p Procedure(s): ?Left Second Toe Amputation (Left) ?Assessment: Stable status post amputation left second toe. ? ?Plan: Betadine and a sterile bandage reapplied to the left foot.  Patient instructed to keep this clean dry and do not remove.  Weightbearing only on the heel and her surgical shoe.  Patient is stable for discharge from podiatry standpoint with follow-up outpatient with Dr. Vickki Muff in a week or 2 ? LOS: 2 days  ? ? ?Beth Mcguire ?02/12/2022 ? ?

## 2022-02-12 NOTE — Consult Note (Signed)
Pharmacy Antibiotic Note ? ?Beth Mcguire is a 81 y.o. 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  Pharmacy has been consulted for vancomycin dosing. status post amputation left second toe. This is day # 4 broad-spectrum antibiotics ? ?Plan: ? ?1) continue vancomycin '750mg'$  IV q24h  ?Goal AUC 400-600 ?Est AUC: 572; Cmax: 35.3; Cmin: 15.7 ?Ke 0.035 h-1, T1/2 19.7h ?SCr 0.95 mg/dL ? ?2) continue cefepime 2g IV q12h ? ?Height: '5\' 3"'$  (160 cm) ?Weight: 68.9 kg (151 lb 14.4 oz) ?IBW/kg (Calculated) : 52.4 ? ?Temp (24hrs), Avg:98.3 ?F (36.8 ?C), Min:97.7 ?F (36.5 ?C), Max:98.8 ?F (37.1 ?C) ? ?Recent Labs  ?Lab 02/09/22 ?1407 02/10/22 ?1660 02/11/22 ?6301 02/12/22 ?0459  ?WBC 9.1 8.9 8.9 9.3  ?CREATININE 1.15* 0.94 1.01* 0.95  ? ?  ?Estimated Creatinine Clearance: 41.8 mL/min (by C-G formula based on SCr of 0.95 mg/dL).   ? ?Allergies  ?Allergen Reactions  ? Amoxicillin-Pot Clavulanate Rash  ? ? ?Antimicrobials this admission: ?VAN/CFP (5/05 >>  ? ?Dose adjustments this admission: ?CTM and Adjust PRN. ? ?Microbiology results: ?5/05 BCx: NGTD ?5/07 WCx: NGTD (pending) ?5/05 MRSA PCR: negative ? ?Thank you for allowing pharmacy to be a part of this patient?s care. ? ?Dallie Piles ?02/12/2022 7:26 AM ? ?

## 2022-02-12 NOTE — TOC Initial Note (Signed)
Transition of Care (TOC) - Initial/Assessment Note  ? ? ?Patient Details  ?Name: Beth Mcguire ?MRN: [id number] ?Date of Birth: 1941/05/06 ? ?Transition of Care (TOC) CM/SW Contact:    ?Alberteen Sam, LCSW ?Phone Number: ?02/12/2022, 9:19 AM ? ?Clinical Narrative:                 ? ?Patient from home with daughter, daughter provides transportation to and from appointments. Patient currently has a walker , shower seat, and wheel chair at home. ? ?TOC will follow for additional discharge needs.  ? Pricilla Riffle, [place] ?[phone] ? ? ?Expected Discharge Plan: Home/Self Care ?Barriers to Discharge: Continued Medical Work up ? ? ?Patient Goals and CMS Choice ?Patient states their goals for this hospitalization and ongoing recovery are:: to go home ?CMS Medicare.gov Compare Post Acute Care list provided to:: Patient ?Choice offered to / list presented to : Patient ? ?Expected Discharge Plan and Services ?Expected Discharge Plan: Home/Self Care ?  ?  ?  ?Living arrangements for the past 2 months: [place] ?                ?  ?  ?  ?  ?  ?  ?  ?  ?  ?  ? ?Prior Living Arrangements/Services ?Living arrangements for the past 2 months: [place] ?Lives with:: Relatives ?  ?       ?  ?  ?  ?  ? ?Activities of Daily Living ?  ?  ? ?Permission Sought/Granted ?  ?  ?   ?   ?   ?   ? ?Emotional Assessment ?  ?  ?  ?  ?  ?  ? ?Admission diagnosis:  Diabetic foot ulcer ([place]) [V77.939, L97.509] ?Toe osteomyelitis ([place]) [M86.9] ?Diabetic foot infection ([place]) [Q30.092, L08.9] ?Osteomyelitis ([place]) [M86.9] ?Patient Active Problem List  ? Diagnosis Date Noted  ? Diabetic foot ulcer ([place]) 02/09/2022  ? Rash 11/26/2021  ? Acute urinary retention 11/26/2021  ? COVID-19 virus infection: RECENT PSOTIVE TEST 10/16/2021 11/25/2021  ? PVD (peripheral vascular disease) ([place])   ? PAD (peripheral artery disease) ([place]) 09/18/2017  ? Pure hypercholesterolemia 08/13/2017  ? Hyperlipidemia 07/25/2016  ? Hypertension 07/25/2016  ? Diabetes  ([place]) 07/25/2016  ? 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 Foot osteomyelitis, right ([place]) 01/12/2016  ? Osteomyelitis ([place]) 01/12/2016  ? Toe ulcer due to DM ([place]) 01/04/2016  ? ?PCP:  [name], MD ?Pharmacy:   ?[facility] [address] [place], [place] - [address] ?[address] ?[place] [address] ?Phone: [phone] Fax: [phone] ? ?[facility] [place], [place] AT [facility] OF [address] ?[place] ?[place] [address] ?Phone: [phone] Fax: [phone] ? ? ? ? ?Social Determinants of Health (SDOH) Interventions ?  ? ?Readmission Risk Interventions ?   ? View : No data to display.  ?  ?  ?  ? ? ? ?

## 2022-02-12 NOTE — Progress Notes (Signed)
?PROGRESS NOTE ? ? ? ?Beth Mcguire  LTJ:[id number] DOB: Jan 25, 1941 DOA: 02/09/2022 ?PCP: [name], MD  ? ?Assessment & Plan: ?  ?Principal Problem: ?  Diabetic foot ulcer ([place]) ?Active Problems: ?  Osteomyelitis ([place]) ? ?Assessment and Plan: ? 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 Continue on IV vanco, cefepime.  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 Wound cx NGTD. Norco, morphine prn for pain. F/u outpatient w/ podiatry in 1 week. Pt can ambulate in postop shoe. No dressing changes needed upon d/c as per podiatry. PT/OT consulted  ? ?DM2: pretty well controlled, HbA1c 6.7. Continue on SSI w/ accuchecks  ? ?Hx of PVD: restarting home dose of aspirin, plavix  ?  ?HTN: continue on lisinopril  ?  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 Currently stage IIIa. Cr is labile  ? ?HLD: continue on statin  ?  ?Dementia: continue on home dose of seroquel. Continue w/ supportive care  ? ? ? ? ?DVT prophylaxis: lovenox ?Code Status:  full  ?Family Communication: called pt's daughter, Horris Latino, no answer & unable to leave a voicemail  ?Disposition Plan:  depends on PT/OT recs  ? ?Level of care: Med-Surg ? ?Status is: Inpatient ?Remains inpatient appropriate because: severity of illness ? ? ? ? ? ?Consultants:  ?Podiatry  ? ?Procedures:  ? ?Antimicrobials: vanco, cefepime  ? ? ?Subjective: ?Pt c/o fatigue  ? ?Objective: ?Vitals:  ? 02/11/22 1634 02/11/22 2008 02/12/22 0762 02/12/22 0546  ?BP: (!) 163/61 (!) 152/51 (!) 168/57 (!) 145/47  ?Pulse: 66 73 71 93  ?Resp: '19 18 18 18  '$ ?Temp: 98.8 ?F (37.1 ?C) 98.8 ?F (37.1 ?C) 97.9 ?F (36.6 ?C) 98.5 ?F (36.9 ?C)  ?TempSrc: Oral Oral Oral   ?SpO2: 98% 99% 97% 96%  ?Weight:      ?Height:      ? ? ?Intake/Output Summary (Last 24 hours) at 02/12/2022 0732 ?Last data filed at 02/11/2022 1700 ?Gross per 24 hour  ?Intake 680 ml  ?Output 2 ml  ?Net 678 ml  ? ?Filed Weights  ? 02/09/22 1700  ?Weight: 68.9 kg  ? ? ?Examination: ? ?General exam: Appears calm &  comfortable  ?Respiratory system: clear breath sounds b/l   ?Cardiovascular system: S1 & S2+. No rubs or clicks  ?Gastrointestinal system: Abd is soft, NT, ND & hypoactive bowel sounds  ?Central nervous system: alert and awake. Moves all extremities  ?Psychiatry:judgement and insight appears poor. Flat mood and affect  ? ? ? ?Data Reviewed: I have personally reviewed following labs and imaging studies ? ?CBC: ?Recent Labs  ?Lab 02/09/22 ?1407 02/10/22 ?2633 02/11/22 ?3545 02/12/22 ?0459  ?WBC 9.1 8.9 8.9 9.3  ?NEUTROABS 5.8  --   --   --   ?HGB 11.2* 10.8* 11.1* 11.6*  ?HCT 36.2 34.6* 34.9* 37.2  ?MCV 91.2 89.2 89.5 89.0  ?PLT 350 334 315 320  ? ?Basic Metabolic Panel: ?Recent Labs  ?Lab 02/09/22 ?1407 02/10/22 ?6256 02/11/22 ?3893 02/12/22 ?0459  ?NA 139 138 139 137  ?K 4.8 4.4 4.1 4.2  ?CL 106 109 110 111  ?CO2 25 24 21* 20*  ?GLUCOSE 171* 161* 163* 193*  ?BUN 29* 32* 33* 32*  ?CREATININE 1.15* 0.94 1.01* 0.95  ?CALCIUM 9.0 8.9 9.0 9.2  ? ?GFR: ?Estimated Creatinine Clearance: 41.8 mL/min (by C-G formula based on SCr of 0.95 mg/dL). ?Liver Function Tests: ?Recent Labs  ?Lab 02/09/22 ?1407  ?AST 13*  ?ALT 13  ?ALKPHOS 144*  ?BILITOT 0.5  ?  PROT 7.0  ?ALBUMIN 3.6  ? ?No results for input(s): LIPASE, AMYLASE in the last 168 hours. ?No results for input(s): AMMONIA in the last 168 hours. ?Coagulation Profile: ?No results for input(s): INR, PROTIME in the last 168 hours. ?Cardiac Enzymes: ?No results for input(s): CKTOTAL, CKMB, CKMBINDEX, TROPONINI in the last 168 hours. ?BNP (last 3 results) ?No results for input(s): PROBNP in the last 8760 hours. ?HbA1C: ?No results for input(s): HGBA1C in the last 72 hours. ?CBG: ?Recent Labs  ?Lab 02/10/22 ?2101 02/11/22 ?1003 02/11/22 ?1219 02/11/22 ?1637 02/11/22 ?2102  ?GLUCAP 220* 173* 152* 192* 217*  ? ?Lipid Profile: ?No results for input(s): CHOL, HDL, LDLCALC, TRIG, CHOLHDL, LDLDIRECT in the last 72 hours. ?Thyroid Function Tests: ?No results for input(s): TSH, T4TOTAL,  FREET4, T3FREE, THYROIDAB in the last 72 hours. ?Anemia Panel: ?No results for input(s): VITAMINB12, FOLATE, FERRITIN, TIBC, IRON, RETICCTPCT in the last 72 hours. ?Sepsis Labs: ?No results for input(s): PROCALCITON, LATICACIDVEN in the last 168 hours. ? ?Recent Results (from the past 240 hour(s))  ?Culture, blood (routine x 2)     Status: None (Preliminary result)  ? Collection Time: 02/09/22  7:17 PM  ? Specimen: BLOOD  ?Result Value Ref Range Status  ? Specimen Description BLOOD RIGHT ANTECUBITAL  Final  ? Special Requests   Final  ?  BOTTLES DRAWN AEROBIC AND ANAEROBIC Blood Culture adequate volume  ? Culture   Final  ?  NO GROWTH 3 DAYS ?Performed at [facility], [address], [place], [place] [address] ?  ? Report Status PENDING  Incomplete  ?Culture, blood (routine x 2)     Status: None (Preliminary result)  ? Collection Time: 02/09/22  7:23 PM  ? Specimen: BLOOD  ?Result Value Ref Range Status  ? Specimen Description BLOOD BLOOD RIGHT HAND  Final  ? Special Requests   Final  ?  BOTTLES DRAWN AEROBIC AND ANAEROBIC Blood Culture adequate volume  ? Culture   Final  ?  NO GROWTH 3 DAYS ?Performed at [facility], [address] [place], [place] [address] ?  ? Report Status PENDING  Incomplete  ?Surgical pcr screen     Status: None  ? Collection Time: 02/10/22  4:30 PM  ? Specimen: Nasal Mucosa; Nasal Swab  ?Result Value Ref Range Status  ? MRSA, PCR NEGATIVE NEGATIVE Final  ? Staphylococcus aureus NEGATIVE NEGATIVE Final  ?  Comment: (NOTE) ?The Xpert SA Assay (FDA approved for NASAL specimens in patients 98 ?years of age and older), is one component of a comprehensive ?surveillance program. It is not intended to diagnose infection nor to ?guide or monitor treatment. ?Performed at [facility], [place], ?[place] [address] ?  ?Aerobic/Anaerobic Culture w Gram Stain (surgical/deep wound)     Status: None (Preliminary result)  ? Collection Time: 02/11/22  9:35 AM  ?  Specimen: PATH Benign ortho; Tissue  ?Result Value Ref Range Status  ? Specimen Description   Final  ?  TISSUE ?Performed at [facility], [address], [place], [place] [address] ?  ? Special Requests   Final  ?  OSTEOMYELITITS LEFT 2ND TOE ?Performed at [facility], [address], [place], [place] [address] ?  ? Gram Stain   Final  ?  NO SQUAMOUS EPITHELIAL CELLS SEEN ?RARE WBC SEEN ?NO ORGANISMS SEEN ?Performed at [facility], [place] [address] [place], [place] [address] ?  ? Culture PENDING  Incomplete  ? Report Status  PENDING  Incomplete  ?  ? ? ? ? ? ?Radiology Studies: ?US ARTERIAL ABI (SCREENING LOWER EXTREMITY) ? ?Result Date: 02/10/2022 ?CLINICAL DATA:  Left second toe wound EXAM: NONINVASIVE PHYSIOLOGIC VASCULAR STUDY OF BILATERAL LOWER EXTREMITIES TECHNIQUE: Evaluation of both lower extremities were performed at rest, including calculation of ankle-brachial indices with single level Doppler, pressure and pulse volume recording. COMPARISON:  None available FINDINGS: Right ABI:  0.57 Left ABI:  0.66 Right Lower Extremity: Monophasic posterior tibial and dorsalis pedis waveforms. Left Lower Extremity: Monophasic posterior tibial and dorsalis pedis waveforms. 0.5-0.79 Moderate PAD IMPRESSION: Moderate bilateral PAD, worse on the right. Electronically Signed   By: Miachel Roux M.D.   On: 02/10/2022 14:39   ? ? ? ? ? ?Scheduled Meds: ? atorvastatin  10 mg Oral Daily  ? enoxaparin (LOVENOX) injection  40 mg Subcutaneous Q24H  ? insulin aspart  0-6 Units Subcutaneous TID WC  ? lisinopril  20 mg Oral Daily  ? QUEtiapine  25 mg Oral QHS  ? ?Continuous Infusions: ? ceFEPime (MAXIPIME) IV 2 g (02/12/22 0542)  ? vancomycin Stopped (02/11/22 1400)  ? ? ? LOS: 2 days  ? ? ?Time spent: 26 mins ? ? ? ?Wyvonnia Dusky, MD ?[facility] ?Pager [phone] ? ?If 7PM-7AM, please contact night-coverage ?02/12/2022, 7:32 AM  ? ?

## 2022-02-13 DIAGNOSIS — I739 Peripheral vascular disease, unspecified: Secondary | ICD-10-CM | POA: Diagnosis not present

## 2022-02-13 DIAGNOSIS — M86672 Other chronic osteomyelitis, left ankle and foot: Secondary | ICD-10-CM | POA: Diagnosis not present

## 2022-02-13 DIAGNOSIS — E1169 Type 2 diabetes mellitus with other specified complication: Secondary | ICD-10-CM | POA: Diagnosis not present

## 2022-02-13 LAB — BASIC METABOLIC PANEL
Anion gap: 7 (ref 5–15)
BUN: 39 mg/dL — ABNORMAL HIGH (ref 8–23)
CO2: 19 mmol/L — ABNORMAL LOW (ref 22–32)
Calcium: 9.2 mg/dL (ref 8.9–10.3)
Chloride: 110 mmol/L (ref 98–111)
Creatinine, Ser: 1.21 mg/dL — ABNORMAL HIGH (ref 0.44–1.00)
GFR, Estimated: 44 mL/min — ABNORMAL LOW (ref >60)
Glucose, Bld: 215 mg/dL — ABNORMAL HIGH (ref 70–99)
Potassium: 4.2 mmol/L (ref 3.5–5.1)
Sodium: 136 mmol/L (ref 135–145)

## 2022-02-13 LAB — CBC
HCT: 34.3 % — ABNORMAL LOW (ref 36.0–46.0)
Hemoglobin: 10.8 g/dL — ABNORMAL LOW (ref 12.0–15.0)
MCH: 27.8 pg (ref 26.0–34.0)
MCHC: 31.5 g/dL (ref 30.0–36.0)
MCV: 88.4 fL (ref 80.0–100.0)
Platelets: 332 10*3/uL (ref 150–400)
RBC: 3.88 MIL/uL (ref 3.87–5.11)
RDW: 13.9 % (ref 11.5–15.5)
WBC: 9.3 10*3/uL (ref 4.0–10.5)
nRBC: 0 % (ref 0.0–0.2)

## 2022-02-13 LAB — GLUCOSE, CAPILLARY: Glucose-Capillary: 198 mg/dL — ABNORMAL HIGH (ref 70–99)

## 2022-02-13 LAB — SURGICAL PATHOLOGY

## 2022-02-13 MED ORDER — DOXYCYCLINE MONOHYDRATE 100 MG PO TABS: 100.0000 mg | ORAL_TABLET | Freq: Two times a day (BID) | ORAL | 0 refills | Status: AC

## 2022-02-13 NOTE — Plan of Care (Signed)

## 2022-02-13 NOTE — Care Management Important Message (Signed)
Important Message ? ?Patient Details  ?Name: Beth Mcguire ?MRN: [id number] ?Date of Birth: August 27, 1941 ? ? ?Medicare Important Message Given:  Yes ? ? ? ? ?Juliann Pulse A Keayra Graham ?02/13/2022, 11:24 AM ?

## 2022-02-13 NOTE — TOC Transition Note (Signed)
Transition of Care (TOC) - CM/SW Discharge Note ? ? ?Patient Details  ?Name: Beth Mcguire ?MRN: [id number] ?Date of Birth: [birth year] ? ?Transition of Care (TOC) CM/SW Contact:  ?Alberteen Sam, LCSW ?Phone Number: ?02/13/2022, 8:57 AM ? ? ?Clinical Narrative:    ? ?Patient to discharge home today, daughter Horris Latino reports she will pick up at dc and is agreeable to [facility]. Malachy Mood with [facility] is able to accept patient for [facility] PT aide and RN services. No dme needs.  ? ?No further dc needs identified. ? ?Final next level of care: [place] ?Barriers to Discharge: No Barriers Identified ? ? ?Patient Goals and CMS Choice ?Patient states their goals for this hospitalization and ongoing recovery are:: to go home ?CMS Medicare.gov Compare Post Acute Care list provided to:: Patient Represenative (must comment) (daughter Horris Latino) ?Choice offered to / list presented to : Adult Children ? ?Discharge Placement ?  ?           ?  ?  ?Name of family member notified: daughter Horris Latino ?Patient and family notified of of transfer: 02/13/22 ? ?Discharge Plan and Services ?  ?  ?           ?  ?  ?  ?  ?  ?HH Arranged: PT, OT, RN, Nurse's Aide ?[place] Agency: [place] ?Date HH Agency Contacted: 02/13/22 ?Time [place]: [phone] ?Representative spoke with at [place]: Malachy Mood ? ?Social Determinants of Health (SDOH) Interventions ?  ? ? ?Readmission Risk Interventions ?   ? View : No data to display.  ?  ?  ?  ? ? ? ? ? ?

## 2022-02-13 NOTE — Discharge Summary (Signed)
Physician Discharge Summary  ?Beth Mcguire EML:[id number] DOB: May 10, 1941 DOA: 02/09/2022 ? ?PCP: [name], MD ? ?Admit date: 02/09/2022 ?Discharge date: 02/13/2022 ? ?Admitted From: home  ?Disposition: home w/ home health  ? ?Recommendations for Outpatient Follow-up:  ?Follow up with PCP in 1-2 weeks ?F/u w/ podiatry, Dr. Vickki Muff, in 1-2 weeks  ? ?Home Health: yes ?Equipment/Devices: ? ?Discharge Condition: stable  ?CODE STATUS: full  ?Diet recommendation: Heart Healthy/carb modified  ? ?Brief/Interim Summary: ?81 y/o F w/ PMH of HTN, HLD, DM2, PVD, dementia who presents w/ left 2nd toe redness x 2-3 days. Majority of hx was obtained from pt and she is poor historian. The left 2nd toe has been red and swollen x 2-3 days. Pt denies any pain and/or discomfort of left 2nd toe. Pt is unsure if she was taking abxs at home or not. Pt denies any fevers, chills, sweating, chest pain, shortness of breath, nausea, vomiting, abd pain, dysuria, urinary urgency, urinary frequency, diarrhea or constipation. Pt lives with her daughter and uses a walker to ambulate.  ? ?Pt was found to have left 2nd toe osteomyelitis. Pt received IV vanco, cefepime while inpatient and was d/c home on po doxycycline to complete the course. 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 Pt can ambulate in postop shoe and no changes needed upon d/c as per podiatry. Pt will need to f/u outpatient w/ podiatry, Dr. Vickki Muff, in 1 week. PT/OT evaluated the pt and recs HH.  ? ?Discharge Diagnoses:  ?Principal Problem: ?  Diabetic foot ulcer ([place]) ?Active Problems: ?  Osteomyelitis ([place]) ? 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 Continue on IV vanco, cefepime while inpatient and switched to doxycycline at d/c to complete the course.  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 Wound cx NGTD. Norco, morphine prn for pain. F/u outpatient w/ podiatry in 1 week. Pt can ambulate in postop shoe. No dressing changes  needed upon d/c as per podiatry. PT/OT consulted  ? ?DM2: pretty well controlled, HbA1c 6.7. Continue on SSI w/ accuchecks  ? ?Hx of PVD: restarting home dose of aspirin, plavix  ?  ?HTN: continue on lisinopril  ?  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 Currently stage IIIa. Cr is labile  ? ?HLD: continue on statin  ?  ?Dementia: continue on home dose of seroquel. Continue w/ supportive care  ? ?Discharge Instructions ? ?Discharge Instructions   ? ? Diet - low sodium heart healthy   Complete by: As directed ?  ? Diet Carb Modified   Complete by: As directed ?  ? Discharge instructions   Complete by: As directed ?  ? F/u w/ PCP in 1-2 weeks. F/u w/ podiatry, Dr. Vickki Muff, in 2 weeks. Patient instructed to keep left foot dressing clean, dry and do not remove.  Weightbearing only on the heel and her surgical shoe.  ? Increase activity slowly   Complete by: As directed ?  ? No wound care   Complete by: As directed ?  ? ?  ? ?Allergies as of 02/13/2022   ? ?   Reactions  ? Amoxicillin-pot Clavulanate Rash  ? ?  ? ?  ?Medication List  ?  ? ?TAKE these medications   ? ?aspirin 81 MG tablet ?Take 1 tablet (81 mg total) by mouth daily. ?  ?atorvastatin 10 MG tablet ?Commonly known as: LIPITOR ?Take 10 mg every evening by mouth. ?  ?B-D ULTRAFINE III SHORT PEN 31G  X 8 MM Misc ?Generic drug: Insulin Pen Needle ?  ?clopidogrel 75 MG tablet ?Commonly known as: PLAVIX ?TAKE 1 TABLET BY MOUTH EVERY DAY ?  ?doxycycline 100 MG tablet ?Commonly known as: ADOXA ?Take 1 tablet (100 mg total) by mouth 2 (two) times daily for 5 days. ?  ?glucose monitoring kit monitoring kit ?1 each by Does not apply route 4 (four) times daily -  before meals and at bedtime. ?  ?ONE TOUCH ULTRA MINI w/Device Kit ?  ?HYDROcodone-acetaminophen 5-325 MG tablet ?Commonly known as: NORCO/VICODIN ?Take 1 tablet by mouth every 6 (six) hours as needed for moderate pain. ?  ?lisinopril 20 MG tablet ?Commonly known as: ZESTRIL ?Take 20 mg by mouth daily. ?   ?metFORMIN 500 MG 24 hr tablet ?Commonly known as: GLUCOPHAGE-XR ?Take 500 mg by mouth daily with supper. ?  ?NovoLOG Mix 70/30 FlexPen (70-30) 100 UNIT/ML FlexPen ?Generic drug: insulin aspart protamine - aspart ?Inject 25 Units into the skin 2 (two) times daily before a meal. ?  ?ONE TOUCH ULTRA TEST test strip ?Generic drug: glucose blood ?  ?OneTouch Delica Lancets 32P Misc ?  ?QUEtiapine 25 MG tablet ?Commonly known as: SEROQUEL ?Take 25 mg by mouth at bedtime. ?  ? ?  ? ? ?Allergies  ?Allergen Reactions  ? Amoxicillin-Pot Clavulanate Rash  ? ? ?Consultations: ?Podiatry  ? ? ?Procedures/Studies: ?US ARTERIAL ABI (SCREENING LOWER EXTREMITY) ? ?Result Date: 02/10/2022 ?CLINICAL DATA:  Left second toe wound EXAM: NONINVASIVE PHYSIOLOGIC VASCULAR STUDY OF BILATERAL LOWER EXTREMITIES TECHNIQUE: Evaluation of both lower extremities were performed at rest, including calculation of ankle-brachial indices with single level Doppler, pressure and pulse volume recording. COMPARISON:  None available FINDINGS: Right ABI:  0.57 Left ABI:  0.66 Right Lower Extremity: Monophasic posterior tibial and dorsalis pedis waveforms. Left Lower Extremity: Monophasic posterior tibial and dorsalis pedis waveforms. 0.5-0.79 Moderate PAD IMPRESSION: Moderate bilateral PAD, worse on the right. Electronically Signed   By: Miachel Roux M.D.   On: 02/10/2022 14:39  ? ?DG Foot Complete Left ? ?Result Date: 02/09/2022 ?CLINICAL DATA:  Wound. Second toe pain. History of diabetes. Peripheral vascular disease. EXAM: LEFT FOOT - COMPLETE 3+ VIEW COMPARISON:  None Available. FINDINGS: There is diffuse decreased bone mineralization. Postsurgical changes are seen of amputation of the first ray to the level of the proximal to mid metatarsal shaft. There is a sharp border of the resection line. Mild-to-moderate plantar calcaneal heel spur. Minimal chronic spurring at the Achilles insertion on the calcaneus. Mild talonavicular, navicular-cuneiform and  tarsometatarsal joint space narrowing and dorsal degenerative osteophytes. There appears to be a soft tissue wound at the distal aspect of the second toe. There is mild erosion of the distal aspect of the distal phalanx of second toe suspicious for acute osteomyelitis. Mild bone fragmentation of this region on lateral view. IMPRESSION: Soft tissue wound of the distal aspect of second toe with mild cortical erosion of the distal aspect of the distal phalanx of second toe concerning for acute osteomyelitis. Electronically Signed   By: Yvonne Kendall M.D.   On: 02/09/2022 14:30   ?(Echo, Carotid, EGD, Colonoscopy, ERCP)  ? ? ?Subjective: Pt c/o fatigue  ? ? ?Discharge Exam: ?Vitals:  ? 02/13/22 0452 02/13/22 0811  ?BP: (!) 142/60 (!) 122/45  ?Pulse: 69 75  ?Resp: 16 16  ?Temp: 97.6 ?F (36.4 ?C) 97.7 ?F (36.5 ?C)  ?SpO2: 98% 100%  ? ?Vitals:  ? 02/12/22 1546 02/12/22 2041 02/13/22 0452 02/13/22 4982  ?BP: 123/82 Marland Kitchen)  148/48 (!) 142/60 (!) 122/45  ?Pulse: 77 61 69 75  ?Resp: '18 16 16 16  ' ?Temp: 98.4 ?F (36.9 ?C) 98.2 ?F (36.8 ?C) 97.6 ?F (36.4 ?C) 97.7 ?F (36.5 ?C)  ?TempSrc:  Oral Oral   ?SpO2: 100% 99% 98% 100%  ?Weight:      ?Height:      ? ? ?General: Pt is alert, awake, not in acute distress ?Cardiovascular: S1/S2 +, no rubs, no gallops ?Respiratory: CTA bilaterally, no wheezing, no rhonchi ?Abdominal: Soft, NT, ND, bowel sounds + ?Extremities: no edema, no cyanosis ? ? ? ?The results of significant diagnostics from this hospitalization (including imaging, microbiology, ancillary and laboratory) are listed below for reference.   ? ? ?Microbiology: ?Recent Results (from the past 240 hour(s))  ?Culture, blood (routine x 2)     Status: None (Preliminary result)  ? Collection Time: 02/09/22  7:17 PM  ? Specimen: BLOOD  ?Result Value Ref Range Status  ? Specimen Description BLOOD RIGHT ANTECUBITAL  Final  ? Special Requests   Final  ?  BOTTLES DRAWN AEROBIC AND ANAEROBIC Blood Culture adequate volume  ? Culture   Final  ?   NO GROWTH 4 DAYS ?Performed at [facility], [address] [place], [place] [address] ?  ? Report Status PENDING  Incomplete  ?Culture, blood (routine x 2)     Status: None (Preliminary result)  ? [place]

## 2022-02-13 NOTE — Progress Notes (Addendum)
Inpatient Diabetes Program Recommendations ? ?AACE/ADA: New Consensus Statement on Inpatient Glycemic Control (2015) ? ?Target Ranges:  Prepandial:   less than 140 mg/dL ?     Peak postprandial:   less than 180 mg/dL (1-2 hours) ?     Critically ill patients:  140 - 180 mg/dL  ? ? Latest Reference Range & Units 02/12/22 07:44 02/12/22 11:50 02/12/22 16:20 02/12/22 20:42  ?Glucose-Capillary 70 - 99 mg/dL 193 (H) ? ?1 unit Novolog ? 225 (H) ? ?2 units Novolog ? 350 (H) ? ?4 units Novolog ? 325 (H)  ?(H): Data is abnormally high ? Latest Reference Range & Units 02/13/22 07:16  ?Glucose-Capillary 70 - 99 mg/dL 198 (H)  ?(H): Data is abnormally high ? ? ?Home DM Meds: 70/30 Insulin 25 units BID ?                             Metformin 500 mg Daily ?  ?Current Orders: Novolog 0-6 units TID ?  ?  ?  ? ?  ?MD- Please consider starting a portion of pt's home dose of 70/30 Insulin: ?  ?70/30 Insulin 8 units BID (1/3 total home dose) ?  ?  ?  ?--Will follow patient during hospitalization-- ?  ?Wyn Quaker RN, MSN, CDE ?Diabetes Coordinator ?Inpatient Glycemic Control Team ?Team Pager: [phone] (8a-5p) ?

## 2022-02-14 LAB — CULTURE, BLOOD (ROUTINE X 2)
Culture: NO GROWTH
Culture: NO GROWTH
Special Requests: ADEQUATE
Special Requests: ADEQUATE

## 2022-02-16 LAB — AEROBIC/ANAEROBIC CULTURE W GRAM STAIN (SURGICAL/DEEP WOUND): Gram Stain: NONE SEEN

## 2022-03-07 ENCOUNTER — Other Ambulatory Visit (INDEPENDENT_AMBULATORY_CARE_PROVIDER_SITE_OTHER): Payer: Self-pay | Admitting: Vascular Surgery

## 2022-03-07 DIAGNOSIS — Z9889 Other specified postprocedural states: Secondary | ICD-10-CM

## 2022-03-09 ENCOUNTER — Ambulatory Visit (INDEPENDENT_AMBULATORY_CARE_PROVIDER_SITE_OTHER): Payer: Medicare HMO

## 2022-03-09 ENCOUNTER — Encounter (INDEPENDENT_AMBULATORY_CARE_PROVIDER_SITE_OTHER): Payer: Self-pay | Admitting: Vascular Surgery

## 2022-03-09 ENCOUNTER — Ambulatory Visit (INDEPENDENT_AMBULATORY_CARE_PROVIDER_SITE_OTHER): Payer: Medicare HMO | Admitting: Vascular Surgery

## 2022-03-09 VITALS — BP 153/77 | HR 71 | Resp 16

## 2022-03-09 DIAGNOSIS — I7025 Atherosclerosis of native arteries of other extremities with ulceration: Secondary | ICD-10-CM | POA: Diagnosis not present

## 2022-03-09 DIAGNOSIS — E1151 Type 2 diabetes mellitus with diabetic peripheral angiopathy without gangrene: Secondary | ICD-10-CM

## 2022-03-09 DIAGNOSIS — I739 Peripheral vascular disease, unspecified: Secondary | ICD-10-CM

## 2022-03-09 DIAGNOSIS — Z9889 Other specified postprocedural states: Secondary | ICD-10-CM

## 2022-03-09 DIAGNOSIS — I10 Essential (primary) hypertension: Secondary | ICD-10-CM | POA: Diagnosis not present

## 2022-03-09 DIAGNOSIS — E785 Hyperlipidemia, unspecified: Secondary | ICD-10-CM

## 2022-03-09 DIAGNOSIS — Z794 Long term (current) use of insulin: Secondary | ICD-10-CM

## 2022-03-09 NOTE — Assessment & Plan Note (Signed)
ABIs today are 0.81 on the right and 0.78 on the left with elevated velocities in the right proximal SFA in the left deep femoral artery as well as tibial disease by duplex.  Flow is currently fairly good and she does not have any active infection or limb threatening ischemic symptoms currently.  Continue close follow-up and see her back in 3 months.  Continue current medical regimen.

## 2022-03-09 NOTE — Assessment & Plan Note (Signed)
lipid control important in reducing the progression of atherosclerotic disease. Continue statin therapy  

## 2022-03-09 NOTE — Assessment & Plan Note (Signed)
blood glucose control important in reducing the progression of atherosclerotic disease. Also, involved in wound healing. On appropriate medications.  

## 2022-03-09 NOTE — Assessment & Plan Note (Signed)
blood pressure control important in reducing the progression of atherosclerotic disease. On appropriate oral medications.  

## 2022-03-09 NOTE — Progress Notes (Signed)
MRN : [id number]  Beth Mcguire is a 81 y.o. (16-Apr-1941) female who presents with chief complaint of  Chief Complaint  Patient presents with   Follow-up    Ultrasound follow up  .  History of Present Illness: Patient returns today in follow up of peripheral arterial disease.  The patient has undergone extensive left lower extremity revascularization for limb threatening ischemia.  She has undergone toe amputation which has been slowly healing.  She is not having a lot of pain.  She walks a limited amount with a walker at this point.  No rest pain.  No new ulceration.  ABIs today are 0.81 on the right and 0.78 on the left with elevated velocities in the right proximal SFA in the left deep femoral artery as well as tibial disease by duplex.  Current Outpatient Medications  Medication Sig Dispense Refill   aspirin 81 MG tablet Take 1 tablet (81 mg total) by mouth daily.     atorvastatin (LIPITOR) 10 MG tablet Take 10 mg every evening by mouth.      B-D ULTRAFINE III SHORT PEN 31G X 8 MM MISC   0   Blood Glucose Monitoring Suppl (ONE TOUCH ULTRA MINI) w/Device KIT      clopidogrel (PLAVIX) 75 MG tablet TAKE 1 TABLET BY MOUTH EVERY DAY 90 tablet 1   glucose monitoring kit (FREESTYLE) monitoring kit 1 each by Does not apply route 4 (four) times daily -  before meals and at bedtime. 1 each 0   lisinopril (PRINIVIL,ZESTRIL) 20 MG tablet Take 20 mg by mouth daily.     metFORMIN (GLUCOPHAGE-XR) 500 MG 24 hr tablet Take 500 mg by mouth daily with supper.  0   NOVOLOG MIX 70/30 FLEXPEN (70-30) 100 UNIT/ML FlexPen Inject 25 Units into the skin 2 (two) times daily before a meal.     ONE TOUCH ULTRA TEST test strip      ONETOUCH DELICA LANCETS 51Z MISC   0   QUEtiapine (SEROQUEL) 25 MG tablet Take 25 mg by mouth at bedtime.     HYDROcodone-acetaminophen (NORCO/VICODIN) 5-325 MG tablet Take 1 tablet by mouth every 6 (six) hours as needed for moderate pain. (Patient not taking: Reported on 02/09/2022) 12  tablet 0   No current facility-administered medications for this visit.    Past Medical History:  Diagnosis Date   Anxiety    Colon cancer ([place])    Dementia ([place]) 2022   Diabetes ([place])    Hypertension    Peripheral vascular disease ([place])     Past Surgical History:  Procedure Laterality Date   AMPUTATION Right 01/14/2016   Procedure: AMPUTATION RAY;  Surgeon: Albertine Patricia, DPM;  Location: [facility];  Service: Podiatry;  Laterality: Right;   AMPUTATION Left 11/26/2021   Procedure: AMPUTATION FIRST RAY;  Surgeon: Sharlotte Alamo, DPM;  Location: [facility];  Service: Podiatry;  Laterality: Left;   AMPUTATION TOE Left 02/11/2022   Procedure: Left Second Toe Amputation;  Surgeon: Samara Deist, DPM;  Location: [facility];  Service: Podiatry;  Laterality: Left;   COLON SURGERY     LOWER EXTREMITY ANGIOGRAPHY Left 08/14/2017   Procedure: Lower Extremity Angiography;  Surgeon: Algernon Huxley, MD;  Location: [place] [facility];  Service: Cardiovascular;  Laterality: Left;   LOWER EXTREMITY ANGIOGRAPHY Left 11/06/2021   Procedure: LOWER EXTREMITY ANGIOGRAPHY;  Surgeon: Algernon Huxley, MD;  Location: [place] [facility];  Service: Cardiovascular;  Laterality: Left;   PERIPHERAL VASCULAR CATHETERIZATION N/A  01/05/2016   Procedure: Lower Extremity Angiography;  Surgeon: Algernon Huxley, MD;  Location: [place] [facility];  Service: Cardiovascular;  Laterality: N/A;   PERIPHERAL VASCULAR CATHETERIZATION  01/05/2016   Procedure: Lower Extremity Intervention;  Surgeon: Algernon Huxley, MD;  Location: [place] [facility];  Service: Cardiovascular;;   PERIPHERAL VASCULAR CATHETERIZATION Right 06/25/2016   Procedure: Lower Extremity Angiography;  Surgeon: Algernon Huxley, MD;  Location: [place] [facility];  Service: Cardiovascular;  Laterality: Right;   PERIPHERAL VASCULAR CATHETERIZATION  06/25/2016   Procedure: Lower Extremity Intervention;  Surgeon: Algernon Huxley, MD;  Location: [place] [facility];  Service:  Cardiovascular;;   TONSILLECTOMY       Social History   Tobacco Use   Smoking status: Never   Smokeless tobacco: Never  Substance Use Topics   Alcohol use: No    Alcohol/week: 0.0 standard drinks   Drug use: No       Family History  Problem Relation Age of Onset   Diabetes Other      Allergies  Allergen Reactions   Amoxicillin-Pot Clavulanate Rash     REVIEW OF SYSTEMS (Negative unless checked)  Constitutional: '[]' Weight loss  '[]' Fever  '[]' Chills Cardiac: '[]' Chest pain   '[]' Chest pressure   '[]' Palpitations   '[]' Shortness of breath when laying flat   '[]' Shortness of breath at rest   '[x]' Shortness of breath with exertion. Vascular:  '[]' Pain in legs with walking   '[]' Pain in legs at rest   '[]' Pain in legs when laying flat   '[]' Claudication   '[]' Pain in feet when walking  '[]' Pain in feet at rest  '[]' Pain in feet when laying flat   '[]' History of DVT   '[]' Phlebitis   '[]' Swelling in legs   '[]' Varicose veins   '[x]' Non-healing ulcers Pulmonary:   '[]' Uses home oxygen   '[]' Productive cough   '[]' Hemoptysis   '[]' Wheeze  '[x]' COPD   '[]' Asthma Neurologic:  '[]' Dizziness  '[]' Blackouts   '[]' Seizures   '[]' History of stroke   '[]' History of TIA  '[]' Aphasia   '[]' Temporary blindness   '[]' Dysphagia   '[]' Weakness or numbness in arms   '[]' Weakness or numbness in legs Musculoskeletal:  '[x]' Arthritis   '[]' Joint swelling   '[x]' Joint pain   '[]' Low back pain Hematologic:  '[]' Easy bruising  '[]' Easy bleeding   '[]' Hypercoagulable state   '[x]' Anemic   Gastrointestinal:  '[]' Blood in stool   '[]' Vomiting blood  '[x]' Gastroesophageal reflux/heartburn   '[]' Abdominal pain Genitourinary:  '[x]' Chronic kidney disease   '[]' Difficult urination  '[]' Frequent urination  '[]' Burning with urination   '[]' Hematuria Skin:  '[]' Rashes   '[x]' Ulcers   '[x]' Wounds Psychological:  '[]' History of anxiety   '[]'  History of major depression.  Physical Examination  BP (!) 153/77 (BP Location: Left Arm)   Pulse 71   Resp 16  Gen:  WD/WN, NAD Head: [place]/AT, No temporalis wasting. Ear/Nose/Throat:  Hearing grossly intact, nares w/o erythema or drainage Eyes: Conjunctiva clear. Sclera non-icteric Neck: Supple.  Trachea midline Pulmonary:  Good air movement, no use of accessory muscles.  Cardiac: Irregular Vascular:  Vessel Right Left  Radial Palpable Palpable                          PT 1+ palpable 1+ palpable  DP 1+ palpable 1+ palpable   Gastrointestinal: soft, non-tender/non-distended. No guarding/reflex.  Musculoskeletal: M/S 5/5 throughout.  No deformity or atrophy.  No edema.  In a wheelchair Neurologic: Sensation grossly intact in extremities.  Symmetrical.  Speech is fluent.  Psychiatric: Judgment intact, Mood & affect appropriate for pt's clinical situation.       Labs Recent Results (from the past 2160 hour(s))  CBC with Differential     Status: Abnormal   Collection Time: 02/09/22  2:07 PM  Result Value Ref Range   WBC 9.1 4.0 - 10.5 K/uL   RBC 3.97 3.87 - 5.11 MIL/uL   Hemoglobin 11.2 (L) 12.0 - 15.0 g/dL   HCT 36.2 36.0 - 46.0 %   MCV 91.2 80.0 - 100.0 fL   MCH 28.2 26.0 - 34.0 pg   MCHC 30.9 30.0 - 36.0 g/dL   RDW 14.0 11.5 - 15.5 %   Platelets 350 150 - 400 K/uL   nRBC 0.0 0.0 - 0.2 %   Neutrophils Relative % 63 %   Neutro Abs 5.8 1.7 - 7.7 K/uL   Lymphocytes Relative 27 %   Lymphs Abs 2.4 0.7 - 4.0 K/uL   Monocytes Relative 5 %   Monocytes Absolute 0.5 0.1 - 1.0 K/uL   Eosinophils Relative 3 %   Eosinophils Absolute 0.3 0.0 - 0.5 K/uL   Basophils Relative 1 %   Basophils Absolute 0.1 0.0 - 0.1 K/uL   Immature Granulocytes 1 %   Abs Immature Granulocytes 0.05 0.00 - 0.07 K/uL    Comment: Performed at [facility], [address] [place], [place] [address]  Comprehensive metabolic panel     Status: Abnormal   Collection Time: 02/09/22  2:07 PM  Result Value Ref Range   Sodium 139 135 - 145 mmol/L   Potassium 4.8 3.5 - 5.1 mmol/L   Chloride 106 98 - 111 mmol/L   CO2 25 22 - 32 mmol/L   Glucose, Bld 171 (H) 70 - 99 mg/dL     Comment: Glucose reference range applies only to samples taken after fasting for at least 8 hours.   BUN 29 (H) 8 - 23 mg/dL   Creatinine, Ser 1.15 (H) 0.44 - 1.00 mg/dL   Calcium 9.0 8.9 - 10.3 mg/dL   Total Protein 7.0 6.5 - 8.1 g/dL   Albumin 3.6 3.5 - 5.0 g/dL   AST 13 (L) 15 - 41 U/L   ALT 13 0 - 44 U/L   Alkaline Phosphatase 144 (H) 38 - 126 U/L   Total Bilirubin 0.5 0.3 - 1.2 mg/dL   GFR, Estimated 47 (L) >60 mL/min    Comment: (NOTE) Calculated using the CKD-EPI Creatinine Equation (2021)    Anion gap 8 5 - 15    Comment: Performed at [facility], [address] [place], [place] [address]  Culture, blood (routine x 2)     Status: None   Collection Time: 02/09/22  7:17 PM   Specimen: BLOOD  Result Value Ref Range   Specimen Description BLOOD RIGHT ANTECUBITAL    Special Requests      BOTTLES DRAWN AEROBIC AND ANAEROBIC Blood Culture adequate volume   Culture      NO GROWTH 5 DAYS Performed at [facility], [address] [place], [place] [address]    Report Status 02/14/2022 FINAL   Culture, blood (routine x 2)     Status: None   Collection Time: 02/09/22  7:23 PM   Specimen: BLOOD  Result Value Ref Range   Specimen Description BLOOD BLOOD RIGHT HAND    Special Requests      BOTTLES DRAWN AEROBIC AND ANAEROBIC Blood Culture adequate volume   Culture      NO GROWTH  5 DAYS Performed at [facility], [address] [place], [place] [address]    Report Status 02/14/2022 FINAL   Glucose, capillary     Status: Abnormal   Collection Time: 02/09/22  7:57 PM  Result Value Ref Range   Glucose-Capillary 120 (H) 70 - 99 mg/dL    Comment: Glucose reference range applies only to samples taken after fasting for at least 8 hours.  Urinalysis, Complete w Microscopic Urine, Clean Catch     Status: Abnormal   Collection Time: 02/10/22 12:40 AM  Result Value Ref Range   Color, Urine YELLOW (A) YELLOW   APPearance CLOUDY (A) CLEAR   Specific  Gravity, Urine 1.013 1.005 - 1.030   pH 6.0 5.0 - 8.0   Glucose, UA NEGATIVE NEGATIVE mg/dL   Hgb urine dipstick SMALL (A) NEGATIVE   Bilirubin Urine NEGATIVE NEGATIVE   Ketones, ur NEGATIVE NEGATIVE mg/dL   Protein, ur NEGATIVE NEGATIVE mg/dL   Nitrite NEGATIVE NEGATIVE   Leukocytes,Ua LARGE (A) NEGATIVE   RBC / HPF 6-10 0 - 5 RBC/hpf   WBC, UA >50 (H) 0 - 5 WBC/hpf   Bacteria, UA RARE (A) NONE SEEN   Squamous Epithelial / LPF 0-5 0 - 5    Comment: Performed at [facility], [address] [place], [place] [address]  CBC     Status: Abnormal   Collection Time: 02/10/22  4:47 AM  Result Value Ref Range   WBC 8.9 4.0 - 10.5 K/uL   RBC 3.88 3.87 - 5.11 MIL/uL   Hemoglobin 10.8 (L) 12.0 - 15.0 g/dL   HCT 34.6 (L) 36.0 - 46.0 %   MCV 89.2 80.0 - 100.0 fL   MCH 27.8 26.0 - 34.0 pg   MCHC 31.2 30.0 - 36.0 g/dL   RDW 14.2 11.5 - 15.5 %   Platelets 334 150 - 400 K/uL   nRBC 0.0 0.0 - 0.2 %    Comment: Performed at [facility], [address] [place], [place] [address]  Basic metabolic panel     Status: Abnormal   Collection Time: 02/10/22  4:47 AM  Result Value Ref Range   Sodium 138 135 - 145 mmol/L   Potassium 4.4 3.5 - 5.1 mmol/L   Chloride 109 98 - 111 mmol/L   CO2 24 22 - 32 mmol/L   Glucose, Bld 161 (H) 70 - 99 mg/dL    Comment: Glucose reference range applies only to samples taken after fasting for at least 8 hours.   BUN 32 (H) 8 - 23 mg/dL   Creatinine, Ser 0.94 0.44 - 1.00 mg/dL   Calcium 8.9 8.9 - 10.3 mg/dL   GFR, Estimated >60 >60 mL/min    Comment: (NOTE) Calculated using the CKD-EPI Creatinine Equation (2021)    Anion gap 5 5 - 15    Comment: Performed at [facility], [address] [place], [place] [address]  Glucose, capillary     Status: Abnormal   Collection Time: 02/10/22  7:39 AM  Result Value Ref Range   Glucose-Capillary 162 (H) 70 - 99 mg/dL    Comment: Glucose reference range applies only to samples taken after  fasting for at least 8 hours.  Glucose, capillary     Status: Abnormal   Collection Time: 02/10/22 12:45 PM  Result Value Ref Range   Glucose-Capillary 200 (H) 70 - 99 mg/dL    Comment: Glucose reference range applies only to samples taken after fasting for at least 8  hours.  Surgical pcr screen     Status: None   Collection Time: 02/10/22  4:30 PM   Specimen: Nasal Mucosa; Nasal Swab  Result Value Ref Range   MRSA, PCR NEGATIVE NEGATIVE   Staphylococcus aureus NEGATIVE NEGATIVE    Comment: (NOTE) The Xpert SA Assay (FDA approved for NASAL specimens in patients 2 years of age and older), is one component of a comprehensive surveillance program. It is not intended to diagnose infection nor to guide or monitor treatment. Performed at [facility], [address] [place], [place] [address]   Glucose, capillary     Status: Abnormal   Collection Time: 02/10/22  4:48 PM  Result Value Ref Range   Glucose-Capillary 242 (H) 70 - 99 mg/dL    Comment: Glucose reference range applies only to samples taken after fasting for at least 8 hours.  Glucose, capillary     Status: Abnormal   Collection Time: 02/10/22  9:01 PM  Result Value Ref Range   Glucose-Capillary 220 (H) 70 - 99 mg/dL    Comment: Glucose reference range applies only to samples taken after fasting for at least 8 hours.  CBC     Status: Abnormal   Collection Time: 02/11/22  6:15 AM  Result Value Ref Range   WBC 8.9 4.0 - 10.5 K/uL   RBC 3.90 3.87 - 5.11 MIL/uL   Hemoglobin 11.1 (L) 12.0 - 15.0 g/dL   HCT 34.9 (L) 36.0 - 46.0 %   MCV 89.5 80.0 - 100.0 fL   MCH 28.5 26.0 - 34.0 pg   MCHC 31.8 30.0 - 36.0 g/dL   RDW 14.3 11.5 - 15.5 %   Platelets 315 150 - 400 K/uL   nRBC 0.0 0.0 - 0.2 %    Comment: Performed at [facility], [address] [place], [place] [address]  Basic metabolic panel     Status: Abnormal   Collection Time: 02/11/22  6:15 AM  Result Value Ref Range   Sodium 139 135 - 145 mmol/L    Potassium 4.1 3.5 - 5.1 mmol/L   Chloride 110 98 - 111 mmol/L   CO2 21 (L) 22 - 32 mmol/L   Glucose, Bld 163 (H) 70 - 99 mg/dL    Comment: Glucose reference range applies only to samples taken after fasting for at least 8 hours.   BUN 33 (H) 8 - 23 mg/dL   Creatinine, Ser 1.01 (H) 0.44 - 1.00 mg/dL   Calcium 9.0 8.9 - 10.3 mg/dL   GFR, Estimated 55 (L) >60 mL/min    Comment: (NOTE) Calculated using the CKD-EPI Creatinine Equation (2021)    Anion gap 8 5 - 15    Comment: Performed at [facility], [address] [place], [place] [address]  Glucose, capillary     Status: Abnormal   Collection Time: 02/11/22  8:51 AM  Result Value Ref Range   Glucose-Capillary 168 (H) 70 - 99 mg/dL    Comment: Glucose reference range applies only to samples taken after fasting for at least 8 hours.  Aerobic/Anaerobic Culture w Gram Stain (surgical/deep wound)     Status: None   Collection Time: 02/11/22  9:35 AM   Specimen: PATH Benign ortho; Tissue  Result Value Ref Range   Specimen Description      TISSUE Performed at [facility], [address], [place], [place] [address]    Special Requests      OSTEOMYELITITS LEFT 2ND TOE Performed at [facility], [address] [place], [place] [address]    Gram Stain      NO SQUAMOUS EPITHELIAL CELLS SEEN RARE WBC SEEN NO ORGANISMS SEEN    Culture      RARE STAPHYLOCOCCUS SIMULANS RARE STAPHYLOCOCCUS LUGDUNENSIS RARE PROPIONIBACTERIUM ACNES Standardized susceptibility testing for this organism is not available. NO ANAEROBES ISOLATED Performed at [facility], [place] [address], [place], [place] [address]    Report Status 02/16/2022 FINAL    Organism ID, Bacteria STAPHYLOCOCCUS SIMULANS    Organism ID, Bacteria STAPHYLOCOCCUS LUGDUNENSIS       Susceptibility   Staphylococcus simulans - MIC*    CIPROFLOXACIN <=0.5 SENSITIVE Sensitive     ERYTHROMYCIN <=0.25 SENSITIVE Sensitive     GENTAMICIN <=0.5 SENSITIVE  Sensitive     OXACILLIN <=0.25 SENSITIVE Sensitive     TETRACYCLINE <=1 SENSITIVE Sensitive     VANCOMYCIN <=0.5 SENSITIVE Sensitive     TRIMETH/SULFA <=10 SENSITIVE Sensitive     CLINDAMYCIN <=0.25 SENSITIVE Sensitive     RIFAMPIN <=0.5 SENSITIVE Sensitive     Inducible Clindamycin NEGATIVE Sensitive     * RARE STAPHYLOCOCCUS SIMULANS   Staphylococcus lugdunensis - MIC*    CIPROFLOXACIN <=0.5 SENSITIVE Sensitive     ERYTHROMYCIN <=0.25 SENSITIVE Sensitive     GENTAMICIN <=0.5 SENSITIVE Sensitive     OXACILLIN 2 SENSITIVE Sensitive     TETRACYCLINE <=1 SENSITIVE Sensitive     VANCOMYCIN <=0.5 SENSITIVE Sensitive     TRIMETH/SULFA <=10 SENSITIVE Sensitive     CLINDAMYCIN <=0.25 SENSITIVE Sensitive     RIFAMPIN <=0.5 SENSITIVE Sensitive     Inducible Clindamycin NEGATIVE Sensitive     * RARE STAPHYLOCOCCUS LUGDUNENSIS  Surgical pathology     Status: None   Collection Time: 02/11/22  9:43 AM  Result Value Ref Range   SURGICAL PATHOLOGY      SURGICAL PATHOLOGY CASE: [id number] PATIENT: [facility] Surgical Pathology Report     Specimen Submitted: A. Toe, left 2nd  Clinical History: Infection.  Osteomyelitis left 2nd toe     DIAGNOSIS: A. TOE, LEFT SECOND; AMPUTATION: - ACUTE OSTEOMYELITIS ASSOCIATED WITH ULCER AND SOFT TISSUE NECROSIS. - PROXIMAL BONE MARGIN IS ARTICULAR CARTILAGE, NEGATIVE FOR OSTEOMYELITIS. - PROXIMAL SOFT TISSUE MARGIN APPEARS VIABLE AND FREE OF ACUTE INFLAMMATION.  GROSS DESCRIPTION: A. Labeled: Second left toe Received: Fresh Collection time: 9:43 AM on 02/11/2022 Placed into formalin time: 10:47 AM on 02/11/2022 Size: 2.6 x 2.4 x 2.2 cm Description of lesion(s): There is a 1.7 x 1.5 cm ulcer at the distal tip of the toe, 1.5 cm from the closest proximal margin Proximal margin: The proximal margins are inked black.  The bone consists of a tan, smooth and glistening articular surface.  The skin and soft tissue is tan and grossly  viable Bone: The bone underlying the ul cer is yellow to red and slightly softened next field none noted Other findings: None noted  Block summary: 1 -perpendicular sections of bone margin 2-ulcer to closest proximal margin with underlying bone  Tissue decalcification: Cassettes 1 and 2  CM 02/12/2022   Final Diagnosis performed by Bryan Lemma, MD.   Electronically signed 02/13/2022 3:58:32PM The electronic signature indicates that the named Attending Pathologist has evaluated the specimen Technical component performed at [facility], [address], [place], [place] [address] Lab: [id number] Dir: Rush Farmer, MD, MMM  Professional component performed at [facility], [facility], [place], [place] [address] Lab: [phone] Dir: Kathi Simpers, MD  Glucose, capillary     Status: Abnormal   Collection Time: 02/11/22 10:03 AM  Result Value Ref Range   Glucose-Capillary 173 (H) 70 - 99 mg/dL    Comment: Glucose reference range applies only to samples taken after fasting for at least 8 hours.  Glucose, capillary     Status: Abnormal   Collection Time: 02/11/22 12:19 PM  Result Value Ref Range   Glucose-Capillary 152 (H) 70 - 99 mg/dL    Comment: Glucose reference range applies only to samples taken after fasting for at least 8 hours.  Glucose, capillary     Status: Abnormal   Collection Time: 02/11/22  4:37 PM  Result Value Ref Range   Glucose-Capillary 192 (H) 70 - 99 mg/dL    Comment: Glucose reference range applies only to samples taken after fasting for at least 8 hours.  Glucose, capillary     Status: Abnormal   Collection Time: 02/11/22  9:02 PM  Result Value Ref Range   Glucose-Capillary 217 (H) 70 - 99 mg/dL    Comment: Glucose reference range applies only to samples taken after fasting for at least 8 hours.  CBC     Status: Abnormal   Collection Time: 02/12/22  4:59 AM  Result Value Ref Range   WBC 9.3 4.0 - 10.5 K/uL   RBC 4.18 3.87 -  5.11 MIL/uL   Hemoglobin 11.6 (L) 12.0 - 15.0 g/dL   HCT 37.2 36.0 - 46.0 %   MCV 89.0 80.0 - 100.0 fL   MCH 27.8 26.0 - 34.0 pg   MCHC 31.2 30.0 - 36.0 g/dL   RDW 14.0 11.5 - 15.5 %   Platelets 320 150 - 400 K/uL   nRBC 0.0 0.0 - 0.2 %    Comment: Performed at [facility], [address] [place], [place] [address]  Basic metabolic panel     Status: Abnormal   Collection Time: 02/12/22  4:59 AM  Result Value Ref Range   Sodium 137 135 - 145 mmol/L   Potassium 4.2 3.5 - 5.1 mmol/L   Chloride 111 98 - 111 mmol/L   CO2 20 (L) 22 - 32 mmol/L   Glucose, Bld 193 (H) 70 - 99 mg/dL    Comment: Glucose reference range applies only to samples taken after fasting for at least 8 hours.   BUN 32 (H) 8 - 23 mg/dL   Creatinine, Ser 0.95 0.44 - 1.00 mg/dL   Calcium 9.2 8.9 - 10.3 mg/dL   GFR, Estimated 59 (L) >60 mL/min    Comment: (NOTE) Calculated using the CKD-EPI Creatinine Equation (2021)    Anion gap 6 5 - 15    Comment: Performed at [facility], [address] [place], [place] [address]  Glucose, capillary     Status: Abnormal   Collection Time: 02/12/22  7:44 AM  Result Value Ref Range   Glucose-Capillary 193 (H) 70 - 99 mg/dL    Comment: Glucose reference range applies only to samples taken after fasting for at least 8 hours.  Glucose, capillary     Status: Abnormal   Collection Time: 02/12/22 11:50 AM  Result Value Ref Range   Glucose-Capillary 225 (H) 70 - 99 mg/dL    Comment: Glucose reference range applies only to samples taken after fasting for at least 8 hours.  Glucose, capillary     Status: Abnormal   Collection Time: 02/12/22  4:20 PM  Result Value Ref Range   Glucose-Capillary 350 (H) 70 -  99 mg/dL    Comment: Glucose reference range applies only to samples taken after fasting for at least 8 hours.  Glucose, capillary     Status: Abnormal   Collection Time: 02/12/22  8:42 PM  Result Value Ref Range   Glucose-Capillary 325 (H) 70 - 99 mg/dL     Comment: Glucose reference range applies only to samples taken after fasting for at least 8 hours.  CBC     Status: Abnormal   Collection Time: 02/13/22  5:12 AM  Result Value Ref Range   WBC 9.3 4.0 - 10.5 K/uL   RBC 3.88 3.87 - 5.11 MIL/uL   Hemoglobin 10.8 (L) 12.0 - 15.0 g/dL   HCT 34.3 (L) 36.0 - 46.0 %   MCV 88.4 80.0 - 100.0 fL   MCH 27.8 26.0 - 34.0 pg   MCHC 31.5 30.0 - 36.0 g/dL   RDW 13.9 11.5 - 15.5 %   Platelets 332 150 - 400 K/uL   nRBC 0.0 0.0 - 0.2 %    Comment: Performed at [facility], [address], [place], [place] [address]  Basic metabolic panel     Status: Abnormal   Collection Time: 02/13/22  5:12 AM  Result Value Ref Range   Sodium 136 135 - 145 mmol/L   Potassium 4.2 3.5 - 5.1 mmol/L   Chloride 110 98 - 111 mmol/L   CO2 19 (L) 22 - 32 mmol/L   Glucose, Bld 215 (H) 70 - 99 mg/dL    Comment: Glucose reference range applies only to samples taken after fasting for at least 8 hours.   BUN 39 (H) 8 - 23 mg/dL   Creatinine, Ser 1.21 (H) 0.44 - 1.00 mg/dL   Calcium 9.2 8.9 - 10.3 mg/dL   GFR, Estimated 44 (L) >60 mL/min    Comment: (NOTE) Calculated using the CKD-EPI Creatinine Equation (2021)    Anion gap 7 5 - 15    Comment: Performed at [facility], [address] [place], [place] [address]  Glucose, capillary     Status: Abnormal   Collection Time: 02/13/22  7:16 AM  Result Value Ref Range   Glucose-Capillary 198 (H) 70 - 99 mg/dL    Comment: Glucose reference range applies only to samples taken after fasting for at least 8 hours.    Radiology US ARTERIAL ABI (SCREENING LOWER EXTREMITY)  Result Date: 02/10/2022 CLINICAL DATA:  Left second toe wound EXAM: NONINVASIVE PHYSIOLOGIC VASCULAR STUDY OF BILATERAL LOWER EXTREMITIES TECHNIQUE: Evaluation of both lower extremities were performed at rest, including calculation of ankle-brachial indices with single level Doppler, pressure and pulse volume recording. COMPARISON:  None  available FINDINGS: Right ABI:  0.57 Left ABI:  0.66 Right Lower Extremity: Monophasic posterior tibial and dorsalis pedis waveforms. Left Lower Extremity: Monophasic posterior tibial and dorsalis pedis waveforms. 0.5-0.79 Moderate PAD IMPRESSION: Moderate bilateral PAD, worse on the right. Electronically Signed   By: Miachel Roux M.D.   On: 02/10/2022 14:39   DG Foot Complete Left  Result Date: 02/09/2022 CLINICAL DATA:  Wound. Second toe pain. History of diabetes. Peripheral vascular disease. EXAM: LEFT FOOT - COMPLETE 3+ VIEW COMPARISON:  None Available. FINDINGS: There is diffuse decreased bone mineralization. Postsurgical changes are seen of amputation of the first ray to the level of the proximal to mid metatarsal shaft. There is a sharp border of the resection line. Mild-to-moderate plantar calcaneal heel spur. Minimal chronic spurring at the Achilles insertion on the calcaneus. Mild talonavicular, navicular-cuneiform and  tarsometatarsal joint space narrowing and dorsal degenerative osteophytes. There appears to be a soft tissue wound at the distal aspect of the second toe. There is mild erosion of the distal aspect of the distal phalanx of second toe suspicious for acute osteomyelitis. Mild bone fragmentation of this region on lateral view. IMPRESSION: Soft tissue wound of the distal aspect of second toe with mild cortical erosion of the distal aspect of the distal phalanx of second toe concerning for acute osteomyelitis. Electronically Signed   By: Yvonne Kendall M.D.   On: 02/09/2022 14:30   VAS [place] ABI WITH/WO TBI  Result Date: 03/09/2022  LOWER EXTREMITY DOPPLER STUDY Patient Name:  Beth Mcguire  Date of Exam:   03/09/2022 Medical Rec #: [id number]       Accession #:    [id number] Date of Birth: 1941-06-13       Patient Gender: F Patient Age:   44 years Exam Location:  [facility] Procedure:      VAS [place] ABI WITH/WO TBI Referring Phys: Corene Cornea Gawain Crombie  --------------------------------------------------------------------------------  Indications: Peripheral artery disease.  Vascular Interventions: 06/25/16: Right SFA/popliteal artery stent;                         08/14/17: Left SFA/popliteal artery PTA/stent with left                         TP trunk & distal posterior tibial artery PTA;                         11/06/21: Left SFA thrombectomy/PTA/stent with left                         popliteal artery thrombecomty/PTA;. Performing Technologist: Blondell Reveal RT, RDMS, RVT  Examination Guidelines: A complete evaluation includes at minimum, Doppler waveform signals and systolic blood pressure reading at the level of bilateral brachial, anterior tibial, and posterior tibial arteries, when vessel segments are accessible. Bilateral testing is considered an integral part of a complete examination. Photoelectric Plethysmograph (PPG) waveforms and toe systolic pressure readings are included as required and additional duplex testing as needed. Limited examinations for reoccurring indications may be performed as noted.  ABI Findings: +---------+------------------+-----+-------------------+-----------+ Right    Rt Pressure (mmHg)IndexWaveform           Comment     +---------+------------------+-----+-------------------+-----------+ Brachial 166                                                   +---------+------------------+-----+-------------------+-----------+ ATA                             not detected                   +---------+------------------+-----+-------------------+-----------+ PTA      59                0.36 dampened monophasiccollaterals +---------+------------------+-----+-------------------+-----------+ PERO     135               0.81 monophasic                     +---------+------------------+-----+-------------------+-----------+ Great Toe  amputation   +---------+------------------+-----+-------------------+-----------+ +---------+------------------+-----+------------+---------+ Left     Lt Pressure (mmHg)IndexWaveform    Comment   +---------+------------------+-----+------------+---------+ ATA                             not detected          +---------+------------------+-----+------------+---------+ PTA      130               0.78 monophasic            +---------+------------------+-----+------------+---------+ PERO     118               0.71 monophasic            +---------+------------------+-----+------------+---------+ Great Toe                                   amputated +---------+------------------+-----+------------+---------+ +-------+-----------+-----------+------------+------------+ ABI/TBIToday's ABIToday's TBIPrevious ABIPrevious TBI +-------+-----------+-----------+------------+------------+ Right  0.81       NA         1.10        NA           +-------+-----------+-----------+------------+------------+ Left   0.78       NA         1.05        NA           +-------+-----------+-----------+------------+------------+ Bilateral ABIs appear decreased compared to prior study on 12/08/21.  Summary: Right: Resting right ankle-brachial index indicates mild right lower extremity arterial disease. Left: Resting left ankle-brachial index indicates moderate left lower extremity arterial disease. *See table(s) above for measurements and observations.  Electronically signed by Leotis Pain MD on 03/09/2022 at 11:43:57 AM.    Final    VAS [place] LOWER EXTREMITY ARTERIAL DUPLEX  Result Date: 03/09/2022 LOWER EXTREMITY ARTERIAL DUPLEX STUDY Patient Name:  Beth Mcguire  Date of Exam:   03/09/2022 Medical Rec #: [id number]       Accession #:    [id number] Date of Birth: 1941/03/09       Patient Gender: F Patient Age:   40 years Exam Location:  [facility] Procedure:      VAS [place] LOWER EXTREMITY ARTERIAL DUPLEX  Referring Phys: Leotis Pain --------------------------------------------------------------------------------   Vascular Interventions: 06/25/16: Right SFA/popliteal artery stent;                         08/14/17: Left SFA/popliteal artery PTA/stent with left                         TP trunk & distal posterior tibial artery PTA;                         11/06/21: Left SFA thrombectomy/PTA/stent with left                         popliteal artery thrombecomty/PTA;Marland Kitchen Current ABI:            Right=0.81 & Left=0.78 Performing Technologist: Blondell Reveal RT, RDMS, RVT  Examination Guidelines: A complete evaluation includes B-mode imaging, spectral Doppler, color Doppler, and power Doppler as needed of all accessible portions of each vessel. Bilateral testing is considered an integral part of a complete examination. Limited examinations for reoccurring indications may be performed as noted.  +-----------+--------+-----+---------------+-------------------+--------------+  RIGHT      PSV cm/sRatioStenosis       Waveform           Comments       +-----------+--------+-----+---------------+-------------------+--------------+ CFA Mid    140                         biphasic                          +-----------+--------+-----+---------------+-------------------+--------------+ DFA        154                         biphasic                          +-----------+--------+-----+---------------+-------------------+--------------+ SFA Prox   449          75-99% stenosisbiphasic           stent          +-----------+--------+-----+---------------+-------------------+--------------+ SFA Mid    35                          monophasic         stent          +-----------+--------+-----+---------------+-------------------+--------------+ SFA Distal 40                          monophasic         stent          +-----------+--------+-----+---------------+-------------------+--------------+ POP Prox   34                           monophasic         stent          +-----------+--------+-----+---------------+-------------------+--------------+ POP Distal 36                          monophasic         stent          +-----------+--------+-----+---------------+-------------------+--------------+ ATA Distal              occluded                                         +-----------+--------+-----+---------------+-------------------+--------------+ PTA Distal 11                          dampened monophasiccollateral fed +-----------+--------+-----+---------------+-------------------+--------------+ PERO Distal72                          monophasic                        +-----------+--------+-----+---------------+-------------------+--------------+  +-----------+--------+-----+---------------+----------+--------+ LEFT       PSV cm/sRatioStenosis       Waveform  Comments +-----------+--------+-----+---------------+----------+--------+ CFA Mid    134                         biphasic           +-----------+--------+-----+---------------+----------+--------+ DFA        296  50-74% stenosisbiphasic           +-----------+--------+-----+---------------+----------+--------+ SFA Prox   37                          biphasic  stent    +-----------+--------+-----+---------------+----------+--------+ SFA Mid    33                          biphasic  stent    +-----------+--------+-----+---------------+----------+--------+ SFA Distal 36                          monophasicstent    +-----------+--------+-----+---------------+----------+--------+ POP Prox   29                          monophasicstent    +-----------+--------+-----+---------------+----------+--------+ POP Mid    41                          monophasicstent    +-----------+--------+-----+---------------+----------+--------+ POP Distal 230                         monophasic          +-----------+--------+-----+---------------+----------+--------+ ATA Distal              occluded                          +-----------+--------+-----+---------------+----------+--------+ PTA Distal 79                          monophasic         +-----------+--------+-----+---------------+----------+--------+ PERO Distal15                          monophasic         +-----------+--------+-----+---------------+----------+--------+  Summary: Right: Stenosis is noted within the proximal SFA stent. Tibial level arterial disease noted. Left: 50-74% stenosis noted in the deep femoral artery. Tibial level arterial disease noted.  See table(s) above for measurements and observations. Electronically signed by Leotis Pain MD on 03/09/2022 at 11:43:44 AM.    Final     Assessment/Plan  Atherosclerosis of native arteries of the extremities with ulceration ([place]) ABIs today are 0.81 on the right and 0.78 on the left with elevated velocities in the right proximal SFA in the left deep femoral artery as well as tibial disease by duplex.  Flow is currently fairly good and she does not have any active infection or limb threatening ischemic symptoms currently.  Continue close follow-up and see her back in 3 months.  Continue current medical regimen.  Hypertension blood pressure control important in reducing the progression of atherosclerotic disease. On appropriate oral medications.   Diabetes ([place]) blood glucose control important in reducing the progression of atherosclerotic disease. Also, involved in wound healing. On appropriate medications.   Hyperlipidemia lipid control important in reducing the progression of atherosclerotic disease. Continue statin therapy    Leotis Pain, MD  03/09/2022 3:45 PM    This note was created with Dragon medical transcription system.  Any errors from dictation are purely unintentional

## 2022-05-29 ENCOUNTER — Encounter: Payer: Self-pay | Admitting: Internal Medicine

## 2022-05-29 ENCOUNTER — Inpatient Hospital Stay
Admission: RE | Admit: 2022-05-29 | Discharge: 2022-05-31 | DRG: 256 | Disposition: A | Discharge status: Home Health | Payer: Medicare HMO | Attending: Internal Medicine | Admitting: Internal Medicine

## 2022-05-29 ENCOUNTER — Inpatient Hospital Stay: Payer: Medicare HMO

## 2022-05-29 ENCOUNTER — Encounter (HOSPITAL_COMMUNITY): Payer: Self-pay

## 2022-05-29 DIAGNOSIS — I739 Peripheral vascular disease, unspecified: Secondary | ICD-10-CM | POA: Diagnosis present

## 2022-05-29 DIAGNOSIS — N1831 Chronic kidney disease, stage 3a: Secondary | ICD-10-CM | POA: Diagnosis present

## 2022-05-29 DIAGNOSIS — Z794 Long term (current) use of insulin: Secondary | ICD-10-CM | POA: Diagnosis not present

## 2022-05-29 DIAGNOSIS — Z79899 Other long term (current) drug therapy: Secondary | ICD-10-CM | POA: Diagnosis not present

## 2022-05-29 DIAGNOSIS — E11628 Type 2 diabetes mellitus with other skin complications: Secondary | ICD-10-CM | POA: Diagnosis not present

## 2022-05-29 DIAGNOSIS — E785 Hyperlipidemia, unspecified: Secondary | ICD-10-CM | POA: Diagnosis present

## 2022-05-29 DIAGNOSIS — Z85038 Personal history of other malignant neoplasm of large intestine: Secondary | ICD-10-CM

## 2022-05-29 DIAGNOSIS — I96 Gangrene, not elsewhere classified: Secondary | ICD-10-CM | POA: Diagnosis present

## 2022-05-29 DIAGNOSIS — N183 Chronic kidney disease, stage 3 unspecified: Secondary | ICD-10-CM

## 2022-05-29 DIAGNOSIS — Z8616 Personal history of COVID-19: Secondary | ICD-10-CM

## 2022-05-29 DIAGNOSIS — Z833 Family history of diabetes mellitus: Secondary | ICD-10-CM | POA: Diagnosis not present

## 2022-05-29 DIAGNOSIS — Z7984 Long term (current) use of oral hypoglycemic drugs: Secondary | ICD-10-CM | POA: Diagnosis not present

## 2022-05-29 DIAGNOSIS — L270 Generalized skin eruption due to drugs and medicaments taken internally: Secondary | ICD-10-CM | POA: Diagnosis not present

## 2022-05-29 DIAGNOSIS — L089 Local infection of the skin and subcutaneous tissue, unspecified: Secondary | ICD-10-CM

## 2022-05-29 DIAGNOSIS — E1140 Type 2 diabetes mellitus with diabetic neuropathy, unspecified: Secondary | ICD-10-CM | POA: Diagnosis present

## 2022-05-29 DIAGNOSIS — Z7982 Long term (current) use of aspirin: Secondary | ICD-10-CM | POA: Diagnosis not present

## 2022-05-29 DIAGNOSIS — E1169 Type 2 diabetes mellitus with other specified complication: Secondary | ICD-10-CM | POA: Diagnosis present

## 2022-05-29 DIAGNOSIS — E11621 Type 2 diabetes mellitus with foot ulcer: Secondary | ICD-10-CM | POA: Diagnosis present

## 2022-05-29 DIAGNOSIS — Z89411 Acquired absence of right great toe: Secondary | ICD-10-CM

## 2022-05-29 DIAGNOSIS — I10 Essential (primary) hypertension: Secondary | ICD-10-CM | POA: Diagnosis present

## 2022-05-29 DIAGNOSIS — E1152 Type 2 diabetes mellitus with diabetic peripheral angiopathy with gangrene: Principal | ICD-10-CM | POA: Diagnosis present

## 2022-05-29 DIAGNOSIS — E119 Type 2 diabetes mellitus without complications: Secondary | ICD-10-CM

## 2022-05-29 DIAGNOSIS — T360X5A Adverse effect of penicillins, initial encounter: Secondary | ICD-10-CM | POA: Diagnosis not present

## 2022-05-29 DIAGNOSIS — L97529 Non-pressure chronic ulcer of other part of left foot with unspecified severity: Secondary | ICD-10-CM | POA: Diagnosis present

## 2022-05-29 DIAGNOSIS — E1122 Type 2 diabetes mellitus with diabetic chronic kidney disease: Secondary | ICD-10-CM | POA: Diagnosis present

## 2022-05-29 DIAGNOSIS — Z7902 Long term (current) use of antithrombotics/antiplatelets: Secondary | ICD-10-CM

## 2022-05-29 DIAGNOSIS — I129 Hypertensive chronic kidney disease with stage 1 through stage 4 chronic kidney disease, or unspecified chronic kidney disease: Secondary | ICD-10-CM | POA: Diagnosis present

## 2022-05-29 DIAGNOSIS — F03918 Unspecified dementia, unspecified severity, with other behavioral disturbance: Secondary | ICD-10-CM | POA: Diagnosis present

## 2022-05-29 DIAGNOSIS — Z66 Do not resuscitate: Secondary | ICD-10-CM | POA: Diagnosis present

## 2022-05-29 DIAGNOSIS — M869 Osteomyelitis, unspecified: Secondary | ICD-10-CM | POA: Diagnosis present

## 2022-05-29 LAB — CBC WITH DIFFERENTIAL/PLATELET
Abs Immature Granulocytes: 0.03 10*3/uL (ref 0.00–0.07)
Basophils Absolute: 0.1 10*3/uL (ref 0.0–0.1)
Basophils Relative: 1 %
Eosinophils Absolute: 0.2 10*3/uL (ref 0.0–0.5)
Eosinophils Relative: 2 %
HCT: 32.4 % — ABNORMAL LOW (ref 36.0–46.0)
Hemoglobin: 10.4 g/dL — ABNORMAL LOW (ref 12.0–15.0)
Immature Granulocytes: 0 %
Lymphocytes Relative: 31 %
Lymphs Abs: 2.9 10*3/uL (ref 0.7–4.0)
MCH: 28.3 pg (ref 26.0–34.0)
MCHC: 32.1 g/dL (ref 30.0–36.0)
MCV: 88.3 fL (ref 80.0–100.0)
Monocytes Absolute: 0.5 10*3/uL (ref 0.1–1.0)
Monocytes Relative: 5 %
Neutro Abs: 5.8 10*3/uL (ref 1.7–7.7)
Neutrophils Relative %: 61 %
Platelets: 355 10*3/uL (ref 150–400)
RBC: 3.67 MIL/uL — ABNORMAL LOW (ref 3.87–5.11)
RDW: 15.4 % (ref 11.5–15.5)
WBC: 9.5 10*3/uL (ref 4.0–10.5)
nRBC: 0 % (ref 0.0–0.2)

## 2022-05-29 LAB — COMPREHENSIVE METABOLIC PANEL
ALT: 8 U/L (ref 0–44)
AST: 13 U/L — ABNORMAL LOW (ref 15–41)
Albumin: 3.1 g/dL — ABNORMAL LOW (ref 3.5–5.0)
Alkaline Phosphatase: 131 U/L — ABNORMAL HIGH (ref 38–126)
Anion gap: 5 (ref 5–15)
BUN: 38 mg/dL — ABNORMAL HIGH (ref 8–23)
CO2: 23 mmol/L (ref 22–32)
Calcium: 9.1 mg/dL (ref 8.9–10.3)
Chloride: 111 mmol/L (ref 98–111)
Creatinine, Ser: 1.31 mg/dL — ABNORMAL HIGH (ref 0.44–1.00)
GFR, Estimated: 40 mL/min — ABNORMAL LOW (ref >60)
Glucose, Bld: 147 mg/dL — ABNORMAL HIGH (ref 70–99)
Potassium: 4.6 mmol/L (ref 3.5–5.1)
Sodium: 139 mmol/L (ref 135–145)
Total Bilirubin: 0.5 mg/dL (ref 0.3–1.2)
Total Protein: 6.1 g/dL — ABNORMAL LOW (ref 6.5–8.1)

## 2022-05-29 LAB — GLUCOSE, CAPILLARY: Glucose-Capillary: 132 mg/dL — ABNORMAL HIGH (ref 70–99)

## 2022-05-29 LAB — SEDIMENTATION RATE: Sed Rate: 27 mm/hr (ref 0–30)

## 2022-05-29 MED ORDER — SODIUM CHLORIDE 0.9% FLUSH
3.0000 mL | Freq: Two times a day (BID) | INTRAVENOUS | Status: DC
  Administered 2022-05-29 – 2022-05-31 (×3): 3 mL via INTRAVENOUS

## 2022-05-29 MED ORDER — SODIUM CHLORIDE 0.9 % IV SOLN: INTRAVENOUS | Status: AC

## 2022-05-29 MED ORDER — INSULIN ASPART 100 UNIT/ML IJ SOLN
0.0000 [IU] | Freq: Three times a day (TID) | INTRAMUSCULAR | Status: DC
  Administered 2022-05-31: 3 [IU] via SUBCUTANEOUS
  Filled 2022-05-29: qty 1

## 2022-05-29 MED ORDER — QUETIAPINE FUMARATE 25 MG PO TABS
25.0000 mg | ORAL_TABLET | Freq: Every day | ORAL | Status: DC
  Administered 2022-05-30: 25 mg via ORAL
  Filled 2022-05-29 (×2): qty 1

## 2022-05-29 MED ORDER — OXYCODONE HCL 5 MG PO TABS: 5.0000 mg | ORAL_TABLET | ORAL | Status: DC | PRN

## 2022-05-29 MED ORDER — INSULIN ASPART 100 UNIT/ML IJ SOLN: 0.0000 [IU] | Freq: Every day | INTRAMUSCULAR | Status: DC

## 2022-05-29 MED ORDER — ACETAMINOPHEN 325 MG PO TABS: 650.0000 mg | ORAL_TABLET | Freq: Four times a day (QID) | ORAL | Status: DC | PRN

## 2022-05-29 MED ORDER — SENNOSIDES-DOCUSATE SODIUM 8.6-50 MG PO TABS: 1.0000 | ORAL_TABLET | Freq: Every evening | ORAL | Status: DC | PRN

## 2022-05-29 MED ORDER — LISINOPRIL 20 MG PO TABS
20.0000 mg | ORAL_TABLET | Freq: Every day | ORAL | Status: DC
  Administered 2022-05-30 – 2022-05-31 (×2): 20 mg via ORAL
  Filled 2022-05-29 (×2): qty 1

## 2022-05-29 MED ORDER — PIPERACILLIN-TAZOBACTAM 3.375 G IVPB
3.3750 g | Freq: Three times a day (TID) | INTRAVENOUS | Status: DC
  Administered 2022-05-29 – 2022-05-30 (×3): 3.375 g via INTRAVENOUS
  Filled 2022-05-29 (×4): qty 50

## 2022-05-29 MED ORDER — HEPARIN SODIUM (PORCINE) 5000 UNIT/ML IJ SOLN
5000.0000 [IU] | Freq: Three times a day (TID) | INTRAMUSCULAR | Status: DC
Start: 2022-05-29 — End: 2022-05-31
  Administered 2022-05-29 – 2022-05-31 (×5): 5000 [IU] via SUBCUTANEOUS
  Filled 2022-05-29 (×5): qty 1

## 2022-05-29 MED ORDER — ATORVASTATIN CALCIUM 10 MG PO TABS
10.0000 mg | ORAL_TABLET | Freq: Every evening | ORAL | Status: DC
  Administered 2022-05-30: 10 mg via ORAL
  Filled 2022-05-29 (×2): qty 1

## 2022-05-29 MED ORDER — ACETAMINOPHEN 650 MG RE SUPP: 650.0000 mg | Freq: Four times a day (QID) | RECTAL | Status: DC | PRN

## 2022-05-29 MED ORDER — ONDANSETRON HCL 4 MG PO TABS: 4.0000 mg | ORAL_TABLET | Freq: Four times a day (QID) | ORAL | Status: DC | PRN

## 2022-05-29 MED ORDER — ONDANSETRON HCL 4 MG/2ML IJ SOLN: 4.0000 mg | Freq: Four times a day (QID) | INTRAMUSCULAR | Status: DC | PRN

## 2022-05-29 NOTE — H&P (Addendum)
History and Physical    Beth Mcguire IHK:[id number] DOB: 03-28-1941 DOA: 05/29/2022  PCP: [name], MD  Patient coming from: Clinic/Home  Chief Complaint: Non healing wound on the left foot  HPI: Beth Mcguire is a 81 y.o. female with medical history significant of anxiety, DM2, HTN, dementia, h/o colon cancer who presents for a non healing diabetic foot wound.  She has been seeing Dr. Vickki Muff in Podiatry.  She has been on antibiotics.  She also follows with vascular surgery and has been found to have mild-mod PVD.  She has no symptoms today, no other complaints.  Her foot does not hurt.  She notes that she was seen in clinic today for a non healing 3rd left toe ulcer on the tip of the foot and asked to be admitted to the hospital.  I spoke with patient's daughter who confirmed her medications and we discussed code status would be DNR.  Review of Systems: As per HPI otherwise all other systems reviewed and are negative.   Past Medical History:  Diagnosis Date   Anxiety    Colon cancer ([place])    Dementia ([place]) 2022   Diabetes ([place])    Hypertension    Peripheral vascular disease ([place])     Past Surgical History:  Procedure Laterality Date   AMPUTATION Right 01/14/2016   Procedure: AMPUTATION RAY;  Surgeon: Albertine Patricia, DPM;  Location: [facility];  Service: Podiatry;  Laterality: Right;   AMPUTATION Left 11/26/2021   Procedure: AMPUTATION FIRST RAY;  Surgeon: Sharlotte Alamo, DPM;  Location: [facility];  Service: Podiatry;  Laterality: Left;   AMPUTATION TOE Left 02/11/2022   Procedure: Left Second Toe Amputation;  Surgeon: Samara Deist, DPM;  Location: [facility];  Service: Podiatry;  Laterality: Left;   COLON SURGERY     LOWER EXTREMITY ANGIOGRAPHY Left 08/14/2017   Procedure: Lower Extremity Angiography;  Surgeon: Algernon Huxley, MD;  Location: [facility];  Service: Cardiovascular;  Laterality: Left;   LOWER EXTREMITY ANGIOGRAPHY Left 11/06/2021   Procedure: LOWER EXTREMITY  ANGIOGRAPHY;  Surgeon: Algernon Huxley, MD;  Location: [facility];  Service: Cardiovascular;  Laterality: Left;   PERIPHERAL VASCULAR CATHETERIZATION N/A 01/05/2016   Procedure: Lower Extremity Angiography;  Surgeon: Algernon Huxley, MD;  Location: [facility];  Service: Cardiovascular;  Laterality: N/A;   PERIPHERAL VASCULAR CATHETERIZATION  01/05/2016   Procedure: Lower Extremity Intervention;  Surgeon: Algernon Huxley, MD;  Location: [facility];  Service: Cardiovascular;;   PERIPHERAL VASCULAR CATHETERIZATION Right 06/25/2016   Procedure: Lower Extremity Angiography;  Surgeon: Algernon Huxley, MD;  Location: [facility];  Service: Cardiovascular;  Laterality: Right;   PERIPHERAL VASCULAR CATHETERIZATION  06/25/2016   Procedure: Lower Extremity Intervention;  Surgeon: Algernon Huxley, MD;  Location: [facility];  Service: Cardiovascular;;   TONSILLECTOMY      Social History  reports that she has never smoked. She has never used smokeless tobacco. She reports that she does not drink alcohol and does not use drugs.  Allergies  Allergen Reactions   Amoxicillin-Pot Clavulanate Rash    Family History  Problem Relation Age of Onset   Diabetes Other   She is not aware of any medical history in her parents.   Prior to Admission medications   Medication Sig Start Date End Date Taking? Authorizing Provider  aspirin 81 MG tablet Take 1 tablet (81 mg total) by mouth daily. 01/06/16   Sudini, Srikar,  MD  atorvastatin (LIPITOR) 10 MG tablet Take 10 mg every evening by mouth.  02/08/16   [provider]  B-D ULTRAFINE III SHORT PEN 31G X 8 MM MISC  08/15/17   [provider]  Blood Glucose Monitoring Suppl (ONE TOUCH ULTRA MINI) w/Device KIT  07/23/16   [provider]  clopidogrel (PLAVIX) 75 MG tablet TAKE 1 TABLET BY MOUTH EVERY DAY 09/22/20   Kris Hartmann, NP  glucose monitoring kit (FREESTYLE) monitoring kit 1 each by Does not apply route 4  (four) times daily -  before meals and at bedtime. 01/06/16   Hillary Bow, MD         lisinopril (PRINIVIL,ZESTRIL) 20 MG tablet Take 20 mg by mouth daily.    [provider]  metFORMIN (GLUCOPHAGE-XR) 500 MG 24 hr tablet Take 500 mg by mouth daily with supper. 08/15/17   [provider]  NOVOLOG MIX 70/30 FLEXPEN (70-30) 100 UNIT/ML FlexPen Inject 25 Units into the skin 2 (two) times daily before a meal. 10/16/21   [provider]  ONE TOUCH ULTRA TEST test strip  07/23/16   [provider]  Jonetta Speak LANCETS [address] [place]  08/30/17   [provider]  QUEtiapine (SEROQUEL) 25 MG tablet Take 25 mg by mouth at bedtime. 12/15/21   [provider]  Doxycycline 152m BID  Physical Exam: Vitals:   05/29/22 2053  BP: (!) 111/58  Pulse: 94  Resp: 20  SpO2: 99%   Const: NAD, calm, comfortable Eyes: lids and conjunctivae normal ENMT: Mucous membranes are moist.  Neck: normal, supple Respiratory: Breathing comfortably on room air, no wheezing Cardiovascular: RR, NR, no murmur, non pitting edema to bilateral LE.  She has 1+ pulses in DP bilaterally.  Limbs are warm Abdomen: +BS, NT, ND Musculoskeletal: She has surgically absent toes on the right and left.  She has normal bulk and tone.  Skin: She has a open lesion on the left at the 3rd digit tip.  This area is erythematous with some necrosis.  Neurologic: CN 2-12 grossly intact. Non focal.  Psychiatric: Normal judgment and insight. Alert and oriented x 3. Normal mood.    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 - Start Zosyn - dosing per pharmacy, will need initial labs prior to starting - ABIs ordered  - previous reviewed and she has mild-moderate disease - Risk for surgery: Will need lab work to assess, currently at least a 1-2% cardiac risk based on RCRI, she will be a 4.2% pulmonary risk for acute respiratory failure after surgery based on Aruzollah index.  Updated risk evaluation by Cardiology if needed tomorrow.  - NPO at breakfast  CKD stage 3a - IVF with NS at 75cc/hr for 8 hours - Reassess after surgery.   PAD (peripheral artery disease) (HCC) - Holding aspirin and plavix in the setting of need for surgery - Restart after surgery  Hyperlipidemia - Continue statin    Hypertension - Continue lisinopril - Check BMET  Dementia - Continue seroquel - Check EKG   DVT prophylaxis: Heparin SQ, hold prior to surgery  Code Status:   DNR  Family Communication:  Daughter BHorris Latinoon the  phone  Disposition Plan:   Patient is from:  Home  Anticipated DC to:  Home, with [place]?  Anticipated DC date:  06/02/22  Anticipated DC barriers: Need for surgery  Consults called:  Dr. Vickki Muff, podiatry  Admission status:  Med Surg, IP   Severity of Illness: The appropriate patient status for this patient is INPATIENT. Inpatient status is judged to be reasonable and necessary in order to provide the required intensity of service to ensure the patient's safety. The patient's presenting symptoms, physical exam findings, and initial radiographic and laboratory data in the context of their chronic comorbidities is felt to place them at high risk for further clinical deterioration. Furthermore, it is not anticipated that the patient will be medically stable for discharge from the hospital within 2 midnights of admission.   * I certify that at the point of admission it is my clinical judgment that the patient will require inpatient hospital care spanning beyond 2 midnights from the point of admission due to high intensity of service, high risk for further deterioration and high frequency of surveillance  required.Gilles Chiquito MD [facility]  How to contact the [facility] Attending or Consulting provider [place] or covering provider during after hours [place], for this patient?   Check the care team in [facility] and look for a) attending/consulting [facility] provider listed and b) the [facility] team listed Log into [email or web address] and use [place]'s universal password to access. If you do not have the password, please contact the hospital operator. Locate the [facility] provider you are looking for under [facility] and page to a number that you can be directly reached. If you still have difficulty reaching the provider, please page the [facility] (Director on Call) for the Hospitalists listed on amion for assistance.  05/29/2022, 9:05 PM

## 2022-05-29 NOTE — Consult Note (Signed)
Pharmacy Antibiotic Note  Beth Mcguire is a 81 y.o. female with PMH including anxiety, DM, HTN, dementia, history of colon cancer admitted on 05/29/2022 with non-healing diabetic foot wound. Pharmacy has been consulted for Zosyn dosing.  Plan: Zosyn 3.375g IV q8h (4 hour infusion).   No data recorded.  No results for input(s): "WBC", "CREATININE", "LATICACIDVEN", "VANCOTROUGH", "VANCOPEAK", "VANCORANDOM", "GENTTROUGH", "GENTPEAK", "GENTRANDOM", "TOBRATROUGH", "TOBRAPEAK", "TOBRARND", "AMIKACINPEAK", "AMIKACINTROU", "AMIKACIN" in the last 168 hours.  CrCl cannot be calculated (Patient's most recent lab result is older than the maximum 21 days allowed.).    Allergies  Allergen Reactions   Amoxicillin-Pot Clavulanate Rash    Antimicrobials this admission: Zosyn 8/22 >>   Dose adjustments this admission: N/A  Microbiology results: N/A  Thank you for allowing pharmacy to be a part of this patient's care.  Benita Gutter 05/29/2022 9:05 PM

## 2022-05-30 ENCOUNTER — Other Ambulatory Visit: Payer: Self-pay

## 2022-05-30 ENCOUNTER — Encounter: Payer: Self-pay | Admitting: Internal Medicine

## 2022-05-30 ENCOUNTER — Encounter
Admission: RE | Disposition: A | Discharge status: Home Health | Payer: Self-pay | Source: Home / Self Care | Attending: Internal Medicine

## 2022-05-30 ENCOUNTER — Inpatient Hospital Stay: Payer: Medicare HMO

## 2022-05-30 ENCOUNTER — Inpatient Hospital Stay: Payer: Medicare HMO | Admitting: Anesthesiology

## 2022-05-30 DIAGNOSIS — E11628 Type 2 diabetes mellitus with other skin complications: Secondary | ICD-10-CM | POA: Diagnosis not present

## 2022-05-30 DIAGNOSIS — F03918 Unspecified dementia, unspecified severity, with other behavioral disturbance: Secondary | ICD-10-CM

## 2022-05-30 DIAGNOSIS — N183 Chronic kidney disease, stage 3 unspecified: Secondary | ICD-10-CM

## 2022-05-30 DIAGNOSIS — L089 Local infection of the skin and subcutaneous tissue, unspecified: Secondary | ICD-10-CM | POA: Diagnosis not present

## 2022-05-30 HISTORY — PX: AMPUTATION TOE: SHX6595

## 2022-05-30 LAB — PROTIME-INR
INR: 1.1 (ref 0.8–1.2)
Prothrombin Time: 14.1 seconds (ref 11.4–15.2)

## 2022-05-30 LAB — C-REACTIVE PROTEIN: CRP: 0.6 mg/dL (ref <1.0)

## 2022-05-30 LAB — GLUCOSE, CAPILLARY
Glucose-Capillary: 43 mg/dL — CL (ref 70–99)
Glucose-Capillary: 47 mg/dL — ABNORMAL LOW (ref 70–99)
Glucose-Capillary: 52 mg/dL — ABNORMAL LOW (ref 70–99)
Glucose-Capillary: 62 mg/dL — ABNORMAL LOW (ref 70–99)
Glucose-Capillary: 149 mg/dL — ABNORMAL HIGH (ref 70–99)
Glucose-Capillary: 164 mg/dL — ABNORMAL HIGH (ref 70–99)
Glucose-Capillary: 117 mg/dL — ABNORMAL HIGH (ref 70–99)
Glucose-Capillary: 97 mg/dL (ref 70–99)
Glucose-Capillary: 123 mg/dL — ABNORMAL HIGH (ref 70–99)

## 2022-05-30 LAB — HEMOGLOBIN A1C
Hgb A1c MFr Bld: 7.4 % — ABNORMAL HIGH (ref 4.8–5.6)
Mean Plasma Glucose: 165.68 mg/dL

## 2022-05-30 LAB — CBC
HCT: 32.5 % — ABNORMAL LOW (ref 36.0–46.0)
Hemoglobin: 10.4 g/dL — ABNORMAL LOW (ref 12.0–15.0)
MCH: 28.3 pg (ref 26.0–34.0)
MCHC: 32 g/dL (ref 30.0–36.0)
MCV: 88.6 fL (ref 80.0–100.0)
Platelets: 362 10*3/uL (ref 150–400)
RBC: 3.67 MIL/uL — ABNORMAL LOW (ref 3.87–5.11)
RDW: 15.4 % (ref 11.5–15.5)
WBC: 11.3 10*3/uL — ABNORMAL HIGH (ref 4.0–10.5)
nRBC: 0 % (ref 0.0–0.2)

## 2022-05-30 LAB — PREALBUMIN: Prealbumin: 13 mg/dL — ABNORMAL LOW (ref 18–38)

## 2022-05-30 SURGERY — AMPUTATION, TOE
Anesthesia: General | Site: Toe | Laterality: Left

## 2022-05-30 SURGERY — Surgical Case
Anesthesia: *Unknown

## 2022-05-30 MED ORDER — PROPOFOL 10 MG/ML IV BOLUS
INTRAVENOUS | Status: DC | PRN
  Administered 2022-05-30: 20 mg via INTRAVENOUS

## 2022-05-30 MED ORDER — HYDROCODONE-ACETAMINOPHEN 5-325 MG PO TABS: 1.0000 | ORAL_TABLET | ORAL | Status: DC | PRN

## 2022-05-30 MED ORDER — PROPOFOL 10 MG/ML IV BOLUS
INTRAVENOUS | Status: AC
  Filled 2022-05-30: qty 20

## 2022-05-30 MED ORDER — FENTANYL CITRATE (PF) 100 MCG/2ML IJ SOLN: 25.0000 ug | INTRAMUSCULAR | Status: DC | PRN

## 2022-05-30 MED ORDER — 0.9 % SODIUM CHLORIDE (POUR BTL) OPTIME
TOPICAL | Status: DC | PRN
  Administered 2022-05-30: 500 mL

## 2022-05-30 MED ORDER — DIPHENHYDRAMINE HCL 50 MG/ML IJ SOLN
INTRAMUSCULAR | Status: DC | PRN
  Administered 2022-05-30: 12.5 mg via INTRAVENOUS

## 2022-05-30 MED ORDER — METRONIDAZOLE 500 MG/100ML IV SOLN
500.0000 mg | Freq: Three times a day (TID) | INTRAVENOUS | Status: DC
  Filled 2022-05-30: qty 100

## 2022-05-30 MED ORDER — FENTANYL CITRATE (PF) 100 MCG/2ML IJ SOLN
INTRAMUSCULAR | Status: AC
  Filled 2022-05-30: qty 2

## 2022-05-30 MED ORDER — DEXTROSE-NACL 5-0.9 % IV SOLN: INTRAVENOUS | Status: DC

## 2022-05-30 MED ORDER — SODIUM CHLORIDE 0.9 % IV SOLN
2.0000 g | INTRAVENOUS | Status: DC
  Administered 2022-05-30: 2 g via INTRAVENOUS
  Filled 2022-05-30: qty 12.5

## 2022-05-30 MED ORDER — PHENYLEPHRINE 80 MCG/ML (10ML) SYRINGE FOR IV PUSH (FOR BLOOD PRESSURE SUPPORT)
PREFILLED_SYRINGE | INTRAVENOUS | Status: DC | PRN
  Administered 2022-05-30: 80 ug via INTRAVENOUS

## 2022-05-30 MED ORDER — FENTANYL CITRATE (PF) 100 MCG/2ML IJ SOLN
INTRAMUSCULAR | Status: DC | PRN
  Administered 2022-05-30: 25 ug via INTRAVENOUS

## 2022-05-30 MED ORDER — BUPIVACAINE HCL 0.5 % IJ SOLN
INTRAMUSCULAR | Status: DC | PRN
  Administered 2022-05-30: 8 mL

## 2022-05-30 MED ORDER — LIDOCAINE HCL (CARDIAC) PF 100 MG/5ML IV SOSY
PREFILLED_SYRINGE | INTRAVENOUS | Status: DC | PRN
  Administered 2022-05-30: 60 mg via INTRAVENOUS

## 2022-05-30 MED ORDER — DIPHENHYDRAMINE HCL 50 MG/ML IJ SOLN
INTRAMUSCULAR | Status: AC
  Filled 2022-05-30: qty 1

## 2022-05-30 MED ORDER — LIDOCAINE HCL (PF) 2 % IJ SOLN
INTRAMUSCULAR | Status: AC
  Filled 2022-05-30: qty 5

## 2022-05-30 MED ORDER — METRONIDAZOLE 500 MG/100ML IV SOLN
500.0000 mg | Freq: Two times a day (BID) | INTRAVENOUS | Status: DC
  Administered 2022-05-30 – 2022-05-31 (×2): 500 mg via INTRAVENOUS
  Filled 2022-05-30 (×2): qty 100

## 2022-05-30 MED ORDER — PROPOFOL 500 MG/50ML IV EMUL
INTRAVENOUS | Status: DC | PRN
  Administered 2022-05-30: 75 ug/kg/min via INTRAVENOUS

## 2022-05-30 SURGICAL SUPPLY — 43 items
BANDAGE ACE 4X5 VEL STRL LF (GAUZE/BANDAGES/DRESSINGS) IMPLANT
BLADE SURG 15 STRL LF DISP TIS (BLADE) ×2 IMPLANT
BLADE SURG 15 STRL SS (BLADE) ×2
BLADE SURG MINI STRL (BLADE) ×1 IMPLANT
BNDG ELASTIC 4X5.8 VLCR STR LF (GAUZE/BANDAGES/DRESSINGS) ×1 IMPLANT
BNDG GAUZE DERMACEA FLUFF (GAUZE/BANDAGES/DRESSINGS) ×1
BNDG GAUZE DERMACEA FLUFF 4 (GAUZE/BANDAGES/DRESSINGS) ×1 IMPLANT
BNDG STRETCH GAUZE 3IN X12FT (GAUZE/BANDAGES/DRESSINGS) ×1 IMPLANT
CNTNR SPEC 2.5X3XGRAD LEK (MISCELLANEOUS) ×1
CONT SPEC 4OZ STER OR WHT (MISCELLANEOUS) ×1
CONT SPEC 4OZ STRL OR WHT (MISCELLANEOUS) ×1
CONTAINER SPEC 2.5X3XGRAD LEK (MISCELLANEOUS) IMPLANT
DRSG XEROFORM 1X8 (GAUZE/BANDAGES/DRESSINGS) IMPLANT
DURAPREP 26ML APPLICATOR (WOUND CARE) ×1 IMPLANT
GAUZE SPONGE 4X4 12PLY STRL (GAUZE/BANDAGES/DRESSINGS) ×1 IMPLANT
GAUZE STRETCH 2X75IN STRL (MISCELLANEOUS) ×1 IMPLANT
GAUZE XEROFORM 1X8 LF (GAUZE/BANDAGES/DRESSINGS) ×1 IMPLANT
GLOVE BIO SURGEON STRL SZ7.5 (GLOVE) ×1 IMPLANT
GLOVE SURG UNDER LTX SZ8 (GLOVE) ×1 IMPLANT
GOWN STRL REUS W/ TWL LRG LVL3 (GOWN DISPOSABLE) ×2 IMPLANT
GOWN STRL REUS W/TWL LRG LVL3 (GOWN DISPOSABLE) ×2
KIT TURNOVER KIT A (KITS) ×1 IMPLANT
LABEL OR SOLS (LABEL) ×1 IMPLANT
NDL FILTER BLUNT 18X1 1/2 (NEEDLE) ×1 IMPLANT
NDL HYPO 25X1 1.5 SAFETY (NEEDLE) ×2 IMPLANT
NEEDLE FILTER BLUNT 18X 1/2SAF (NEEDLE) ×1
NEEDLE FILTER BLUNT 18X1 1/2 (NEEDLE) ×1 IMPLANT
NEEDLE HYPO 25X1 1.5 SAFETY (NEEDLE) ×2 IMPLANT
NS IRRIG 500ML POUR BTL (IV SOLUTION) ×1 IMPLANT
PACK EXTREMITY ARMC (MISCELLANEOUS) ×1 IMPLANT
PAD ABD DERMACEA PRESS 5X9 (GAUZE/BANDAGES/DRESSINGS) ×2 IMPLANT
SOL PREP PVP 2OZ (MISCELLANEOUS) ×1
SOLUTION PREP PVP 2OZ (MISCELLANEOUS) ×1 IMPLANT
STOCKINETTE BIAS CUT 4 980044 (GAUZE/BANDAGES/DRESSINGS) ×1 IMPLANT
STOCKINETTE STRL 6IN 960660 (GAUZE/BANDAGES/DRESSINGS) ×1 IMPLANT
STRIP CLOSURE SKIN 1/4X4 (GAUZE/BANDAGES/DRESSINGS) ×1 IMPLANT
SUT ETHILON 3-0 FS-10 30 BLK (SUTURE) ×1
SUT ETHILON 4-0 (SUTURE)
SUT ETHILON 4-0 FS2 18XMFL BLK (SUTURE)
SUTURE EHLN 3-0 FS-10 30 BLK (SUTURE) ×1 IMPLANT
SUTURE ETHLN 4-0 FS2 18XMF BLK (SUTURE) IMPLANT
SYR 10ML LL (SYRINGE) ×1 IMPLANT
WATER STERILE IRR 500ML POUR (IV SOLUTION) ×1 IMPLANT

## 2022-05-30 NOTE — Hospital Course (Addendum)
Taken from H&P.   Beth Mcguire is a 81 y.o. female with medical history significant of anxiety, DM2, HTN, dementia, h/o colon cancer who presents for a non healing diabetic foot wound.  She has been seeing Dr. Vickki Muff in Podiatry.  She has been on antibiotics.  She also follows with vascular surgery and has been found to have mild-mod PVD.  She has no symptoms today, no other complaints.  Her foot does not hurt.  She notes that she was seen in clinic today for a non healing 3rd left toe ulcer on the tip of the foot and asked to be admitted to the hospital.  Labs with mild leukocytosis.  Priti albumin low at 13, BUN 38 and creatinine of 1.31 with baseline around 1.  ESR and CRP within normal limit, A1c at 7.4 X-ray concerning for osteomyelitis of third left toe. ABI was ordered and podiatry was consulted. She was started on Zosyn.  8/23: Patient remained stable and will be going for third toe amputation to the OR later today.  8/24: Patient had her left third toe amputation with podiatry yesterday.  Tolerated the procedure well.  Labs seems stable. Pain well controlled.  PT is recommending home health services.  Discussed with podiatry and she can be discharged home for 1 week of antibiotics with doxycycline. Currently had an allergic reaction to Zosyn with some rash last night requiring Benadryl.  Home health services were ordered and patient is being discharged home to follow-up with podiatry.  She will continue on current medications and follow-up with his providers.

## 2022-05-30 NOTE — Progress Notes (Signed)
Progress Note   Patient: Beth Mcguire TJQ:[id number] DOB: 20-Apr-1941 DOA: 05/29/2022     1 DOS: the patient was seen and examined on 05/30/2022   Brief [facility] course: Taken from H&P.   Beth Mcguire is a 81 y.o. female with medical history significant of anxiety, DM2, HTN, dementia, h/o colon cancer who presents for a non healing diabetic foot wound.  She has been seeing Dr. Vickki Muff in Podiatry.  She has been on antibiotics.  She also follows with vascular surgery and has been found to have mild-mod PVD.  She has no symptoms today, no other complaints.  Her foot does not hurt.  She notes that she was seen in clinic today for a non healing 3rd left toe ulcer on the tip of the foot and asked to be admitted to the [facility].  Labs with mild leukocytosis.  Beth Mcguire albumin low at 13, BUN 38 and creatinine of 1.31 with baseline around 1.  ESR and CRP within normal limit, A1c at 7.4 X-ray concerning for osteomyelitis of third left toe. ABI was ordered and podiatry was consulted. She was started on Zosyn.  8/23: Patient remained stable and will be going for third toe amputation to the OR later today.   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 Artery was consulted and she will be going to the OR for amputation later today. -Continue with Zosyn  Osteomyelitis (Beth Mcguire) Please see above  PAD (peripheral artery disease) (Beth Mcguire) Holding home dose of aspirin and Plavix for the need of surgery. -Can restart once cleared from surgery  Type 2 diabetes mellitus with chronic kidney disease, with long-term current use of insulin (Beth Mcguire) Patient had 1 episode of hypoglycemia as she was n.p.o.  Resolved with food.  A1c of 7.4 -Started on IV fluids with D5 while waiting for surgery. -Continue with SSI  Hypertension - Continue home lisinopril  Hyperlipidemia - Continue home statin  Chronic kidney disease  (CKD), stage III (moderate) (Beth Mcguire) Slight worsening of creatinine with history of CKD stage IIIa. -IV fluid -Monitor renal function -Avoid nephrotoxins  Dementia with behavioral disturbance (Beth Mcguire) - Continue Seroquel -Delirium precautions   Subjective: Patient was seen and examined today.  No complaints.  Waiting for surgery later today.  Daughter at bedside.  Physical Exam: Vitals:   05/29/22 2053 05/30/22 0411 05/30/22 0818  BP: (!) 111/58 (!) 131/41 (!) 177/56  Pulse: 94 74 80  Resp: '20 20 20  ' Temp: 97.9 F (36.6 C) 98 F (36.7 C) 98 F (36.7 C)  TempSrc: Oral Oral Oral  SpO2: 99% 100% 100%  Weight: 63.6 kg    Height: '5\' 2"'  (1.575 m)     General.  Well-developed elderly lady, in no acute distress. Pulmonary.  Lungs clear bilaterally, normal respiratory effort. CV.  Regular rate and rhythm, no JVD, rub or murmur. Abdomen.  Soft, nontender, nondistended, BS positive. CNS.  Alert and oriented .  No focal neurologic deficit. Extremities.  No edema, no cyanosis, pulses intact but weak and symmetrical.  Left foot with clean bandage. Psychiatry.  Judgment and insight appears normal.  Data Reviewed: Prior notes, labs and images reviewed  Family Communication: Discussed with daughter at bedside  Disposition: Status is: Inpatient Remains inpatient appropriate because: Severity of illness   Planned Discharge Destination: Home with Home Health  Time spent: 45 minutes  This record has been created using Systems analyst. Errors have  been sought and corrected,but may not always be located. Such creation errors do not reflect on the standard of care.  Author: Lorella Nimrod, MD 05/30/2022 2:23 PM  For on call review [email or web address].

## 2022-05-30 NOTE — Assessment & Plan Note (Signed)
-   Continue Seroquel -Delirium precautions

## 2022-05-30 NOTE — Assessment & Plan Note (Signed)
-   Continue home statin °

## 2022-05-30 NOTE — Anesthesia Procedure Notes (Signed)
Date/Time: 05/30/2022 4:18 PM  Performed by: Loletha Grayer, CRNAPre-anesthesia Checklist: Emergency Drugs available, Patient identified, Suction available, Patient being monitored and Timeout performed Patient Re-evaluated:Patient Re-evaluated prior to induction Oxygen Delivery Method: Simple face mask

## 2022-05-30 NOTE — Consult Note (Signed)
WOC consulted for LE wound, patient for surgery today on this area. Will have amputation today per podiatry.  WOC consult not needed at this time.   [place], [place], [place]

## 2022-05-30 NOTE — Progress Notes (Signed)
Pharmacy Antibiotic Note  Beth Mcguire is a 81 y.o. female admitted on 05/29/2022 with wound infection.  Pharmacy has been consulted for Cefepime dosing.  Plan: Cefepime 2 gm q24h per indication & renal fxn  Pharmacy will continue to follow and will adjust abx dosing whenever warranted.  Height: '5\' 2"'$  (157.5 cm) Weight: 63.6 kg (140 lb 3.4 oz) IBW/kg (Calculated) : 50.1  Temp (24hrs), Avg:98.3 F (36.8 C), Min:97.7 F (36.5 C), Max:99.4 F (37.4 C)   Recent Labs  Lab 05/29/22 2146 05/30/22 0529  WBC 9.5 11.3*  CREATININE 1.31*  --     Estimated Creatinine Clearance: 28.5 mL/min (A) (by C-G formula based on SCr of 1.31 mg/dL (H)).    Allergies  Allergen Reactions   Amoxicillin-Pot Clavulanate Rash   Zosyn [Piperacillin Sod-Tazobactam So] Rash    Antimicrobials this admission: 8/23 Cefepime >>  8/23 Flagyl >>  8/22 Zosyn >> 8/23 - Developed Rash  Microbiology results: 8/23 WoundCx: Pending  Thank you for allowing pharmacy to be a part of this patient's care.  Renda Rolls, PharmD, [facility] 05/30/2022 9:58 PM

## 2022-05-30 NOTE — Interval H&P Note (Signed)
History and Physical Interval Note:  05/30/2022 6:11 PM  Beth Mcguire  has presented today for surgery, with the diagnosis of Toe Amputation Left.  The various methods of treatment have been discussed with the patient and family. After consideration of risks, benefits and other options for treatment, the patient has consented to  Procedure(s): AMPUTATION TOE (Left) as a surgical intervention.  The patient's history has been reviewed, patient examined, no change in status, stable for surgery.  I have reviewed the patient's chart and labs.  Questions were answered to the patient's satisfaction.     Durward Fortes

## 2022-05-30 NOTE — Progress Notes (Incomplete)
       CROSS COVER NOTE  NAME: Beth Mcguire MRN: [id number] DOB : 1941-06-15    Date of Service   05/30/2022   HPI/Events of Note   Rash present on arrival to PACU at 1740. No pruritus, flat CHG bath prior Per report this happened last time she was in the hospital  Interventions   Plan: X X X      This document was prepared using Dragon voice recognition software and may include unintentional dictation errors.  Neomia Glass DNP, MHA, FNP-BC Nurse Practitioner [facility] Pager [phone]

## 2022-05-30 NOTE — Progress Notes (Signed)
Pt transferred from clinic/home by daughter, Horris Latino. Pt is A/O, skin checked and wound on pt LLE. Pt VS were taken and pt was oriented to the room on how to use the call light to ask for help. Pt daughter said pt had taken night medicine before she was transferred to the floor. This nurse called the daughter to confirm which night medicines pt had taken before been transferred to the floor in order not to duplicate given the same meds the daughter had given earlier. MD came to the room to observe pt's LLE and need for medicine for the LLE. IV team was consulted and a new PIV was placed on pt.

## 2022-05-30 NOTE — Assessment & Plan Note (Signed)
Holding home dose of aspirin and Plavix for the need of surgery. -Can restart once cleared from surgery

## 2022-05-30 NOTE — Progress Notes (Signed)
BG 44, on repeat check 43.  MD made aware.  Patient given PO intervention per hypoglycemia protocol.  Patient not in distress.

## 2022-05-30 NOTE — Assessment & Plan Note (Signed)
Please see above

## 2022-05-30 NOTE — Anesthesia Preprocedure Evaluation (Signed)
Anesthesia Evaluation  Patient identified by MRN, date of birth, ID band Patient awake    Reviewed: Allergy & Precautions, NPO status , Patient's Chart, lab work & pertinent test results  History of Anesthesia Complications Negative for: history of anesthetic complications  Airway Mallampati: III  TM Distance: >3 FB Neck ROM: full    Dental  (+) Chipped, Poor Dentition, Dental Advidsory Given   Pulmonary neg shortness of breath, pneumonia (Covid + 11/15/2021. Original infection was 10/16/2021), resolved, neg recent URI,    Pulmonary exam normal        Cardiovascular Exercise Tolerance: Poor hypertension, Pt. on medications (-) angina+ Peripheral Vascular Disease  Normal cardiovascular exam  EKG 11/14/21: Sinus or ectopic atrial rhythm Short PR interval Left ventricular hypertrophy  3 weeks ago, patient underwent peripheral catheterization, and stent placed in in the left SFA.   Neuro/Psych PSYCHIATRIC DISORDERS Anxiety Dementia negative neurological ROS     GI/Hepatic negative GI ROS, Neg liver ROS,   Endo/Other  diabetes, Type 2, Insulin Dependent  Renal/GU CRFRenal disease  negative genitourinary   Musculoskeletal   Abdominal (+) + obese,   Peds  Hematology  (+) Blood dyscrasia, anemia ,   Anesthesia Other Findings Osteomyelitis left foot  Past Medical History: No date: Anxiety No date: Colon cancer ([place]) 2022: Dementia ([place]) No date: Diabetes ([place]) No date: Hypertension No date: Peripheral vascular disease [facility])  Past Surgical History: 01/14/2016: AMPUTATION; Right     Comment:  Procedure: AMPUTATION RAY;  Surgeon: Albertine Patricia,               DPM;  Location: [facility] ORS;  Service: Podiatry;                Laterality: Right; No date: COLON SURGERY 08/14/2017: LOWER EXTREMITY ANGIOGRAPHY; Left     Comment:  Procedure: Lower Extremity Angiography;  Surgeon: Algernon Huxley, MD;  Location: [place] CV LAB;  Service:               Cardiovascular;  Laterality: Left; 11/06/2021: LOWER EXTREMITY ANGIOGRAPHY; Left     Comment:  Procedure: LOWER EXTREMITY ANGIOGRAPHY;  Surgeon: Algernon Huxley, MD;  Location: [place] CV LAB;  Service:               Cardiovascular;  Laterality: Left; 01/05/2016: PERIPHERAL VASCULAR CATHETERIZATION; N/A     Comment:  Procedure: Lower Extremity Angiography;  Surgeon: Algernon Huxley, MD;  Location: [place] CV LAB;  Service:               Cardiovascular;  Laterality: N/A; 01/05/2016: PERIPHERAL VASCULAR CATHETERIZATION     Comment:  Procedure: Lower Extremity Intervention;  Surgeon: Algernon Huxley, MD;  Location: [place] CV LAB;  Service:               Cardiovascular;; 06/25/2016: PERIPHERAL VASCULAR CATHETERIZATION; Right     Comment:  Procedure: Lower Extremity Angiography;  Surgeon: Algernon Huxley, MD;  Location: [place] CV LAB;  Service:               Cardiovascular;  Laterality: Right; 06/25/2016: PERIPHERAL VASCULAR CATHETERIZATION     Comment:  Procedure: Lower Extremity Intervention;  Surgeon: Algernon Huxley, MD;  Location: [place] [facility] LAB;  Service:               Cardiovascular;; No date: TONSILLECTOMY     Reproductive/Obstetrics negative OB ROS                             Anesthesia Physical  Anesthesia Plan  ASA: 3  Anesthesia Plan: General   Post-op Pain Management:    Induction: Intravenous  PONV Risk Score and Plan: 2 and TIVA, Treatment may vary due to age or medical condition and Propofol infusion  Airway Management Planned: Natural Airway and Nasal Cannula  Additional Equipment:   Intra-op Plan:   Post-operative Plan:   Informed Consent: I have reviewed the patients History and Physical, chart, labs and discussed the procedure including the risks, benefits and alternatives for the proposed anesthesia with the  patient or authorized representative who has indicated his/her understanding and acceptance.     Dental advisory given  Plan Discussed with: Anesthesiologist, CRNA and Surgeon  Anesthesia Plan Comments:         Anesthesia Quick Evaluation

## 2022-05-30 NOTE — Assessment & Plan Note (Signed)
-   Continue home lisinopril 

## 2022-05-30 NOTE — Assessment & Plan Note (Signed)
Patient with chronic diabetic ulcer of third left toe, imaging concerning for osteomyelitis. Artery was consulted and she will be going to the OR for amputation later today. -Continue with Zosyn

## 2022-05-30 NOTE — Progress Notes (Signed)
Last BG checked 147.

## 2022-05-30 NOTE — Op Note (Signed)
Date of operation: 05/30/2022.  Surgeon: Durward Fortes D.P.M.  Postoperative diagnosis: Same.  Procedure: Amputation left third toe.  Anesthesia: Local MAC.  Hemostasis: None.  Estimated blood loss: 5 to 10 cc.  Cultures: Bone culture distal left third toe.  Pathology: Left third toe.  Complications: None apparent.  Operative indications: This is an 81 year old female with diabetes and neuropathy as well as peripheral vascular disease with multiple prior amputations.  Recently developed ulceration on the left third toe down to the level of the bone and decision made for hospitalization and amputation of the third toe.  Operative procedure: Patient was taken to the operating room and placed on the table in the supine position.  Following satisfactory sedation the left foot was anesthetized with 8 cc of 0.5% Marcaine plain around the third metatarsal.  The foot was prepped and draped in usual sterile fashion.  Attention directed to the base of the third toe where an elliptical incision was made coursing dorsal to plantar around the medial and lateral base of the third toe.  Incision carried sharply down to the level of the bone and dissection proximally to the joint where the toe was disarticulated at the metatarsal phalangeal joint and removed.  Bone from the distal aspect of the toe was taken for cultures and the toe was passed off the table for pathology.  Good healthy tissues were noted and the wound was flushed with copious amounts of sterile saline and closed using 3-0 nylon vertical mattress and simple interrupted sutures.  Xeroform 4 x 4's and conformer applied to the left foot followed by Kerlix and an Ace.  Patient was awakened and transported the PACU having tolerated the anesthesia and procedures well.

## 2022-05-30 NOTE — Assessment & Plan Note (Addendum)
Patient had 1 episode of hypoglycemia as she was n.p.o.  Resolved with food.  A1c of 7.4 -Started on IV fluids with D5 while waiting for surgery. -Continue with SSI

## 2022-05-30 NOTE — H&P (View-Only) (Signed)
Reason for Consult: Ulceration with osteomyelitis left third toe. Referring Physician: [name] is an 81 y.o. female.  HPI: This is an 81 year old female with peripheral vascular disease and diabetes with history of multiple previous amputations.  Recently developed a sore on her left third toe and decision was made for hospitalization for antibiotics and amputation of the third toe.  Past Medical History:  Diagnosis Date   Anxiety    Colon cancer ([place])    Dementia ([place]) 2022   Diabetes ([place])    Hypertension    Peripheral vascular disease ([place])     Past Surgical History:  Procedure Laterality Date   AMPUTATION Right 01/14/2016   Procedure: AMPUTATION RAY;  Surgeon: Albertine Patricia, DPM;  Location: [facility];  Service: Podiatry;  Laterality: Right;   AMPUTATION Left 11/26/2021   Procedure: AMPUTATION FIRST RAY;  Surgeon: Sharlotte Alamo, DPM;  Location: [facility];  Service: Podiatry;  Laterality: Left;   AMPUTATION TOE Left 02/11/2022   Procedure: Left Second Toe Amputation;  Surgeon: Samara Deist, DPM;  Location: [facility];  Service: Podiatry;  Laterality: Left;   COLON SURGERY     LOWER EXTREMITY ANGIOGRAPHY Left 08/14/2017   Procedure: Lower Extremity Angiography;  Surgeon: Algernon Huxley, MD;  Location: [facility] [facility];  Service: Cardiovascular;  Laterality: Left;   LOWER EXTREMITY ANGIOGRAPHY Left 11/06/2021   Procedure: LOWER EXTREMITY ANGIOGRAPHY;  Surgeon: Algernon Huxley, MD;  Location: [place] [facility];  Service: Cardiovascular;  Laterality: Left;   PERIPHERAL VASCULAR CATHETERIZATION N/A 01/05/2016   Procedure: Lower Extremity Angiography;  Surgeon: Algernon Huxley, MD;  Location: [place] [facility];  Service: Cardiovascular;  Laterality: N/A;   PERIPHERAL VASCULAR CATHETERIZATION  01/05/2016   Procedure: Lower Extremity Intervention;  Surgeon: Algernon Huxley, MD;  Location: [place] [place] [facility];  Service: Cardiovascular;;   PERIPHERAL VASCULAR CATHETERIZATION Right 06/25/2016    Procedure: Lower Extremity Angiography;  Surgeon: Algernon Huxley, MD;  Location: [place] [facility];  Service: Cardiovascular;  Laterality: Right;   PERIPHERAL VASCULAR CATHETERIZATION  06/25/2016   Procedure: Lower Extremity Intervention;  Surgeon: Algernon Huxley, MD;  Location: [facility];  Service: Cardiovascular;;   TONSILLECTOMY      Family History  Problem Relation Age of Onset   Diabetes Other     Social History:  reports that she has never smoked. She has never used smokeless tobacco. She reports that she does not drink alcohol and does not use drugs.  Allergies:  Allergies  Allergen Reactions   Amoxicillin-Pot Clavulanate Rash    Medications: Scheduled:  atorvastatin  10 mg Oral QPM   heparin  5,000 Units Subcutaneous Q8H   insulin aspart  0-15 Units Subcutaneous TID WC   insulin aspart  0-5 Units Subcutaneous QHS   lisinopril  20 mg Oral Daily   QUEtiapine  25 mg Oral QHS   sodium chloride flush  3 mL Intravenous Q12H    Results for orders placed or performed during the hospital encounter of 05/29/22 (from the past 48 hour(s))  Hemoglobin A1c     Status: Abnormal   Collection Time: 05/29/22  9:46 PM  Result Value Ref Range   Hgb A1c MFr Bld 7.4 (H) 4.8 - 5.6 %    Comment: (NOTE) Pre diabetes:          5.7%-6.4%  Diabetes:              >6.4%  Glycemic control for   <  7.0% adults with diabetes    Mean Plasma Glucose 165.68 mg/dL    Comment: Performed at [facility] [address] [place], [place] [address]  Sedimentation rate     Status: None   Collection Time: 05/29/22  9:46 PM  Result Value Ref Range   Sed Rate 27 0 - 30 mm/hr    Comment: Performed at [facility], [address] [place], [place] [address]  C-reactive protein     Status: None   Collection Time: 05/29/22  9:46 PM  Result Value Ref Range   CRP 0.6 <1.0 mg/dL    Comment: Performed at [facility], [place] [address], [place], [place] [address]  Prealbumin      Status: Abnormal   Collection Time: 05/29/22  9:46 PM  Result Value Ref Range   Prealbumin 13 (L) 18 - 38 mg/dL    Comment: Performed at [facility] [address], [place], [place] [address]  Comprehensive metabolic panel     Status: Abnormal   Collection Time: 05/29/22  9:46 PM  Result Value Ref Range   Sodium 139 135 - 145 mmol/L   Potassium 4.6 3.5 - 5.1 mmol/L   Chloride 111 98 - 111 mmol/L   CO2 23 22 - 32 mmol/L   Glucose, Bld 147 (H) 70 - 99 mg/dL    Comment: Glucose reference range applies only to samples taken after fasting for at least 8 hours.   BUN 38 (H) 8 - 23 mg/dL   Creatinine, Ser 1.31 (H) 0.44 - 1.00 mg/dL   Calcium 9.1 8.9 - 10.3 mg/dL   Total Protein 6.1 (L) 6.5 - 8.1 g/dL   Albumin 3.1 (L) 3.5 - 5.0 g/dL   AST 13 (L) 15 - 41 U/L   ALT 8 0 - 44 U/L   Alkaline Phosphatase 131 (H) 38 - 126 U/L   Total Bilirubin 0.5 0.3 - 1.2 mg/dL   GFR, Estimated 40 (L) >60 mL/min    Comment: (NOTE) Calculated using the CKD-EPI Creatinine Equation (2021)    Anion gap 5 5 - 15    Comment: Performed at [facility], [address] [place],  [address]  CBC with Differential/Platelet     Status: Abnormal   Collection Time: 05/29/22  9:46 PM  Result Value Ref Range   WBC 9.5 4.0 - 10.5 K/uL   RBC 3.67 (L) 3.87 - 5.11 MIL/uL   Hemoglobin 10.4 (L) 12.0 - 15.0 g/dL   HCT 32.4 (L) 36.0 - 46.0 %   MCV 88.3 80.0 - 100.0 fL   MCH 28.3 26.0 - 34.0 pg   MCHC 32.1 30.0 - 36.0 g/dL   RDW 15.4 11.5 - 15.5 %   Platelets 355 150 - 400 K/uL   nRBC 0.0 0.0 - 0.2 %   Neutrophils Relative % 61 %   Neutro Abs 5.8 1.7 - 7.7 K/uL   Lymphocytes Relative 31 %   Lymphs Abs 2.9 0.7 - 4.0 K/uL   Monocytes Relative 5 %   Monocytes Absolute 0.5 0.1 - 1.0 K/uL   Eosinophils Relative 2 %   Eosinophils Absolute 0.2 0.0 - 0.5 K/uL   Basophils Relative 1 %   Basophils Absolute 0.1 0.0 - 0.1 K/uL   Immature Granulocytes 0 %   Abs Immature Granulocytes 0.03 0.00 - 0.07 K/uL     Comment: Performed at [facility], [address] [place], [place] [address]  Glucose, capillary  Status: Abnormal   Collection Time: 05/29/22 10:04 PM  Result Value Ref Range   Glucose-Capillary 132 (H) 70 - 99 mg/dL    Comment: Glucose reference range applies only to samples taken after fasting for at least 8 hours.  CBC     Status: Abnormal   Collection Time: 05/30/22  5:29 AM  Result Value Ref Range   WBC 11.3 (H) 4.0 - 10.5 K/uL   RBC 3.67 (L) 3.87 - 5.11 MIL/uL   Hemoglobin 10.4 (L) 12.0 - 15.0 g/dL   HCT 32.5 (L) 36.0 - 46.0 %   MCV 88.6 80.0 - 100.0 fL   MCH 28.3 26.0 - 34.0 pg   MCHC 32.0 30.0 - 36.0 g/dL   RDW 15.4 11.5 - 15.5 %   Platelets 362 150 - 400 K/uL   nRBC 0.0 0.0 - 0.2 %    Comment: Performed at [facility], [address] [place], [place] [address]  Protime-INR     Status: None   Collection Time: 05/30/22  5:29 AM  Result Value Ref Range   Prothrombin Time 14.1 11.4 - 15.2 seconds   INR 1.1 0.8 - 1.2    Comment: (NOTE) INR goal varies based on device and disease states. Performed at [facility], [address] [place],  [address]   Glucose, capillary     Status: Abnormal   Collection Time: 05/30/22  8:06 AM  Result Value Ref Range   Glucose-Capillary 43 (LL) 70 - 99 mg/dL    Comment: Glucose reference range applies only to samples taken after fasting for at least 8 hours.  Glucose, capillary     Status: Abnormal   Collection Time: 05/30/22  8:35 AM  Result Value Ref Range   Glucose-Capillary 47 (L) 70 - 99 mg/dL    Comment: Glucose reference range applies only to samples taken after fasting for at least 8 hours.    DG Foot 2 Views Left  Result Date: 05/29/2022 CLINICAL DATA:  Diabetic foot infection. EXAM: LEFT FOOT - 2 VIEW COMPARISON:  Radiographs 02/09/2022 FINDINGS: Heterogeneously decreased density involving the third toe distal tuft suspicious for osteomyelitis. There is been prior  transmetatarsal amputation of the first ray and resection of the second toe at the proximal phalanx. There is section margins are smooth. There is marked soft tissue thickening and edema overlying the dorsum of the forefoot. No definite soft tissue gas. IMPRESSION: 1. Findings suspicious for osteomyelitis of the third toe distal tuft. 2. Marked soft tissue thickening and edema overlying the dorsum of the forefoot. No definite soft tissue gas. 3. Prior transmetatarsal amputation of the first ray and resection of the second toe at the proximal phalanx. Electronically Signed   By: Keith Rake M.D.   On: 05/29/2022 22:38    Review of Systems  Constitutional:  Negative for chills and fever.  HENT:  Negative for sinus pain and sore throat.   Respiratory:  Negative for cough and shortness of breath.   Cardiovascular:  Negative for chest pain and palpitations.  Gastrointestinal:  Negative for nausea and vomiting.  Endocrine: Negative for polydipsia and polyuria.  Genitourinary:  Negative for frequency and urgency.  Musculoskeletal:        Patient relates previous multiple amputations of toes.  No current pain.  Skin:        Patient relates redness and some drainage from her left third toe  Neurological:        Significant neuropathy associated with her diabetes.  Psychiatric/Behavioral:  Negative for agitation. The patient is not nervous/anxious.    Blood pressure (!) 177/56, pulse 80, temperature 98 F (36.7 C), temperature source Oral, resp. rate 20, height '5\' 2"'$  (1.575 m), weight 63.6 kg, SpO2 100 %. Physical Exam Cardiovascular:     Comments: DP and PT pulses are diminished but palpable. Musculoskeletal:     Comments: Previous amputation left first and second toes and right first ray.  Skin:    Comments: The skin is warm dry and somewhat atrophic.  Some bilateral edema.  Full-thickness ulceration probing to bone with some erythema on the left third toe.  Neurological:     Comments: Loss  of protective threshold with a monofilament wire bilateral.      Assessment/Plan: Assessment: 1.  Full-thickness ulceration with osteomyelitis left third toe. 2.  Diabetes with associated neuropathy. 3.  Peripheral vascular disease.  Plan: Betadine and sterile bandage applied to the third toe.  Discussed with the patient the need for amputation of the third toe.  Discussed possible risks and complications of the procedure including but not limited to continued infection and inability to heal due to her diabetes or peripheral vascular disease as well as potential for further surgery pending healing.  Questions invited and answered.  No guarantees could be given as to the outcome.  At this point we will plan for surgery this afternoon after work.  Durward Fortes 05/30/2022, 8:47 AM

## 2022-05-30 NOTE — Consult Note (Signed)
Reason for Consult: Ulceration with osteomyelitis left third toe. Referring Physician: [name] is an 81 y.o. female.  HPI: This is an 81 year old female with peripheral vascular disease and diabetes with history of multiple previous amputations.  Recently developed a sore on her left third toe and decision was made for hospitalization for antibiotics and amputation of the third toe.  Past Medical History:  Diagnosis Date   Anxiety    Colon cancer ([place])    Dementia ([place]) 2022   Diabetes ([place])    Hypertension    Peripheral vascular disease ([place])     Past Surgical History:  Procedure Laterality Date   AMPUTATION Right 01/14/2016   Procedure: AMPUTATION RAY;  Surgeon: Albertine Patricia, DPM;  Location: [facility];  Service: Podiatry;  Laterality: Right;   AMPUTATION Left 11/26/2021   Procedure: AMPUTATION FIRST RAY;  Surgeon: Sharlotte Alamo, DPM;  Location: [facility];  Service: Podiatry;  Laterality: Left;   AMPUTATION TOE Left 02/11/2022   Procedure: Left Second Toe Amputation;  Surgeon: Samara Deist, DPM;  Location: [facility];  Service: Podiatry;  Laterality: Left;   COLON SURGERY     LOWER EXTREMITY ANGIOGRAPHY Left 08/14/2017   Procedure: Lower Extremity Angiography;  Surgeon: Algernon Huxley, MD;  Location: [place] [facility];  Service: Cardiovascular;  Laterality: Left;   LOWER EXTREMITY ANGIOGRAPHY Left 11/06/2021   Procedure: LOWER EXTREMITY ANGIOGRAPHY;  Surgeon: Algernon Huxley, MD;  Location: [facility];  Service: Cardiovascular;  Laterality: Left;   PERIPHERAL VASCULAR CATHETERIZATION N/A 01/05/2016   Procedure: Lower Extremity Angiography;  Surgeon: Algernon Huxley, MD;  Location: [place] [facility];  Service: Cardiovascular;  Laterality: N/A;   PERIPHERAL VASCULAR CATHETERIZATION  01/05/2016   Procedure: Lower Extremity Intervention;  Surgeon: Algernon Huxley, MD;  Location: [facility];  Service: Cardiovascular;;   PERIPHERAL VASCULAR CATHETERIZATION Right 06/25/2016    Procedure: Lower Extremity Angiography;  Surgeon: Algernon Huxley, MD;  Location: [place] [facility];  Service: Cardiovascular;  Laterality: Right;   PERIPHERAL VASCULAR CATHETERIZATION  06/25/2016   Procedure: Lower Extremity Intervention;  Surgeon: Algernon Huxley, MD;  Location: [facility];  Service: Cardiovascular;;   TONSILLECTOMY      Family History  Problem Relation Age of Onset   Diabetes Other     Social History:  reports that she has never smoked. She has never used smokeless tobacco. She reports that she does not drink alcohol and does not use drugs.  Allergies:  Allergies  Allergen Reactions   Amoxicillin-Pot Clavulanate Rash    Medications: Scheduled:  atorvastatin  10 mg Oral QPM   heparin  5,000 Units Subcutaneous Q8H   insulin aspart  0-15 Units Subcutaneous TID WC   insulin aspart  0-5 Units Subcutaneous QHS   lisinopril  20 mg Oral Daily   QUEtiapine  25 mg Oral QHS   sodium chloride flush  3 mL Intravenous Q12H    Results for orders placed or performed during the hospital encounter of 05/29/22 (from the past 48 hour(s))  Hemoglobin A1c     Status: Abnormal   Collection Time: 05/29/22  9:46 PM  Result Value Ref Range   Hgb A1c MFr Bld 7.4 (H) 4.8 - 5.6 %    Comment: (NOTE) Pre diabetes:          5.7%-6.4%  Diabetes:              >6.4%  Glycemic control for   <  7.0% adults with diabetes    Mean Plasma Glucose 165.68 mg/dL    Comment: Performed at [facility] [address] [place], [place] [address]  Sedimentation rate     Status: None   Collection Time: 05/29/22  9:46 PM  Result Value Ref Range   Sed Rate 27 0 - 30 mm/hr    Comment: Performed at [facility], [address] [place], [place] [address]  C-reactive protein     Status: None   Collection Time: 05/29/22  9:46 PM  Result Value Ref Range   CRP 0.6 <1.0 mg/dL    Comment: Performed at [facility], [place] [address] [place], [place] [address]  Prealbumin      Status: Abnormal   Collection Time: 05/29/22  9:46 PM  Result Value Ref Range   Prealbumin 13 (L) 18 - 38 mg/dL    Comment: Performed at [facility] [address] [place], [place] [address]  Comprehensive metabolic panel     Status: Abnormal   Collection Time: 05/29/22  9:46 PM  Result Value Ref Range   Sodium 139 135 - 145 mmol/L   Potassium 4.6 3.5 - 5.1 mmol/L   Chloride 111 98 - 111 mmol/L   CO2 23 22 - 32 mmol/L   Glucose, Bld 147 (H) 70 - 99 mg/dL    Comment: Glucose reference range applies only to samples taken after fasting for at least 8 hours.   BUN 38 (H) 8 - 23 mg/dL   Creatinine, Ser 1.31 (H) 0.44 - 1.00 mg/dL   Calcium 9.1 8.9 - 10.3 mg/dL   Total Protein 6.1 (L) 6.5 - 8.1 g/dL   Albumin 3.1 (L) 3.5 - 5.0 g/dL   AST 13 (L) 15 - 41 U/L   ALT 8 0 - 44 U/L   Alkaline Phosphatase 131 (H) 38 - 126 U/L   Total Bilirubin 0.5 0.3 - 1.2 mg/dL   GFR, Estimated 40 (L) >60 mL/min    Comment: (NOTE) Calculated using the CKD-EPI Creatinine Equation (2021)    Anion gap 5 5 - 15    Comment: Performed at [facility], [address] [place], [place] [address]  CBC with Differential/Platelet     Status: Abnormal   Collection Time: 05/29/22  9:46 PM  Result Value Ref Range   WBC 9.5 4.0 - 10.5 K/uL   RBC 3.67 (L) 3.87 - 5.11 MIL/uL   Hemoglobin 10.4 (L) 12.0 - 15.0 g/dL   HCT 32.4 (L) 36.0 - 46.0 %   MCV 88.3 80.0 - 100.0 fL   MCH 28.3 26.0 - 34.0 pg   MCHC 32.1 30.0 - 36.0 g/dL   RDW 15.4 11.5 - 15.5 %   Platelets 355 150 - 400 K/uL   nRBC 0.0 0.0 - 0.2 %   Neutrophils Relative % 61 %   Neutro Abs 5.8 1.7 - 7.7 K/uL   Lymphocytes Relative 31 %   Lymphs Abs 2.9 0.7 - 4.0 K/uL   Monocytes Relative 5 %   Monocytes Absolute 0.5 0.1 - 1.0 K/uL   Eosinophils Relative 2 %   Eosinophils Absolute 0.2 0.0 - 0.5 K/uL   Basophils Relative 1 %   Basophils Absolute 0.1 0.0 - 0.1 K/uL   Immature Granulocytes 0 %   Abs Immature Granulocytes 0.03 0.00 - 0.07 K/uL     Comment: Performed at [facility], [address] [place], [place] [address]  Glucose, capillary  Status: Abnormal   Collection Time: 05/29/22 10:04 PM  Result Value Ref Range   Glucose-Capillary 132 (H) 70 - 99 mg/dL    Comment: Glucose reference range applies only to samples taken after fasting for at least 8 hours.  CBC     Status: Abnormal   Collection Time: 05/30/22  5:29 AM  Result Value Ref Range   WBC 11.3 (H) 4.0 - 10.5 K/uL   RBC 3.67 (L) 3.87 - 5.11 MIL/uL   Hemoglobin 10.4 (L) 12.0 - 15.0 g/dL   HCT 32.5 (L) 36.0 - 46.0 %   MCV 88.6 80.0 - 100.0 fL   MCH 28.3 26.0 - 34.0 pg   MCHC 32.0 30.0 - 36.0 g/dL   RDW 15.4 11.5 - 15.5 %   Platelets 362 150 - 400 K/uL   nRBC 0.0 0.0 - 0.2 %    Comment: Performed at [facility], [address] [place], [place] [address]  Protime-INR     Status: None   Collection Time: 05/30/22  5:29 AM  Result Value Ref Range   Prothrombin Time 14.1 11.4 - 15.2 seconds   INR 1.1 0.8 - 1.2    Comment: (NOTE) INR goal varies based on device and disease states. Performed at [facility], [address] [place], [place] [address]   Glucose, capillary     Status: Abnormal   Collection Time: 05/30/22  8:06 AM  Result Value Ref Range   Glucose-Capillary 43 (LL) 70 - 99 mg/dL    Comment: Glucose reference range applies only to samples taken after fasting for at least 8 hours.  Glucose, capillary     Status: Abnormal   Collection Time: 05/30/22  8:35 AM  Result Value Ref Range   Glucose-Capillary 47 (L) 70 - 99 mg/dL    Comment: Glucose reference range applies only to samples taken after fasting for at least 8 hours.    DG Foot 2 Views Left  Result Date: 05/29/2022 CLINICAL DATA:  Diabetic foot infection. EXAM: LEFT FOOT - 2 VIEW COMPARISON:  Radiographs 02/09/2022 FINDINGS: Heterogeneously decreased density involving the third toe distal tuft suspicious for osteomyelitis. There is been prior  transmetatarsal amputation of the first ray and resection of the second toe at the proximal phalanx. There is section margins are smooth. There is marked soft tissue thickening and edema overlying the dorsum of the forefoot. No definite soft tissue gas. IMPRESSION: 1. Findings suspicious for osteomyelitis of the third toe distal tuft. 2. Marked soft tissue thickening and edema overlying the dorsum of the forefoot. No definite soft tissue gas. 3. Prior transmetatarsal amputation of the first ray and resection of the second toe at the proximal phalanx. Electronically Signed   By: Keith Rake M.D.   On: 05/29/2022 22:38    Review of Systems  Constitutional:  Negative for chills and fever.  HENT:  Negative for sinus pain and sore throat.   Respiratory:  Negative for cough and shortness of breath.   Cardiovascular:  Negative for chest pain and palpitations.  Gastrointestinal:  Negative for nausea and vomiting.  Endocrine: Negative for polydipsia and polyuria.  Genitourinary:  Negative for frequency and urgency.  Musculoskeletal:        Patient relates previous multiple amputations of toes.  No current pain.  Skin:        Patient relates redness and some drainage from her left third toe  Neurological:        Significant neuropathy associated with her diabetes.  Psychiatric/Behavioral:  Negative for agitation. The patient is not nervous/anxious.    Blood pressure (!) 177/56, pulse 80, temperature 98 F (36.7 C), temperature source Oral, resp. rate 20, height '5\' 2"'$  (1.575 m), weight 63.6 kg, SpO2 100 %. Physical Exam Cardiovascular:     Comments: DP and PT pulses are diminished but palpable. Musculoskeletal:     Comments: Previous amputation left first and second toes and right first ray.  Skin:    Comments: The skin is warm dry and somewhat atrophic.  Some bilateral edema.  Full-thickness ulceration probing to bone with some erythema on the left third toe.  Neurological:     Comments: Loss  of protective threshold with a monofilament wire bilateral.      Assessment/Plan: Assessment: 1.  Full-thickness ulceration with osteomyelitis left third toe. 2.  Diabetes with associated neuropathy. 3.  Peripheral vascular disease.  Plan: Betadine and sterile bandage applied to the third toe.  Discussed with the patient the need for amputation of the third toe.  Discussed possible risks and complications of the procedure including but not limited to continued infection and inability to heal due to her diabetes or peripheral vascular disease as well as potential for further surgery pending healing.  Questions invited and answered.  No guarantees could be given as to the outcome.  At this point we will plan for surgery this afternoon after work.  Durward Fortes 05/30/2022, 8:47 AM

## 2022-05-30 NOTE — Progress Notes (Signed)
Upon arrival to pre-op holding noted hot ,red,  non elevated rash scattered over arms, legs, back face.  Very warm to touch. Patient denies itching, shortness of breath, burning or any other symptoms.  Dr. Rosey Bath notified .Benadryl given by CRNA.  Dr. Cleda Mccreedy at bedside and aware.  Zosyn infusion discontinued.

## 2022-05-30 NOTE — Progress Notes (Signed)
Mobility Specialist - Progress Note    05/30/22 1135  Mobility  Activity Ambulated with assistance to bathroom;Ambulated with assistance in hallway  Level of Assistance Standby assist, set-up cues, supervision of patient - no hands on  Assistive Device Front wheel walker  Distance Ambulated (ft) 10 ft  Activity Response Tolerated well  $Mobility charge 1 Mobility   Pt supine upon entry, utilizing RA. Pt completed bed mobility with SBA. Pt ambulated to restroom and out in hallway using RW, tolerated well. Pt left supine with needs in reach. Family present at bedside. No complaints.   Candie Mile Mobility Specialist 05/30/22 11:38 AM

## 2022-05-30 NOTE — Assessment & Plan Note (Signed)
Slight worsening of creatinine with history of CKD stage IIIa. -IV fluid -Monitor renal function -Avoid nephrotoxins

## 2022-05-30 NOTE — Transfer of Care (Signed)
Immediate Anesthesia Transfer of Care Note  Patient: Beth Mcguire  Procedure(s) Performed: AMPUTATION TOE , LEFT THIRD TOE (Left: Toe)  Patient Location: PACU  Anesthesia Type:MAC  Level of Consciousness: awake, drowsy and patient cooperative  Airway & Oxygen Therapy: Patient Spontanous Breathing  Post-op Assessment: Report given to RN and Post -op Vital signs reviewed and stable  Post vital signs: Reviewed and stable  Last Vitals:  Vitals Value Taken Time  BP 145/60 05/30/22 1857  Temp 36.5 C 05/30/22 1857  Pulse 70 05/30/22 1900  Resp 18 05/30/22 1900  SpO2 100 % 05/30/22 1900  Vitals shown include unvalidated device data.  Last Pain:  Vitals:   05/30/22 1618  TempSrc: Oral  PainSc:          Complications: No notable events documented.

## 2022-05-31 ENCOUNTER — Encounter: Payer: Self-pay | Admitting: Podiatry

## 2022-05-31 DIAGNOSIS — E11628 Type 2 diabetes mellitus with other skin complications: Secondary | ICD-10-CM | POA: Diagnosis not present

## 2022-05-31 DIAGNOSIS — L089 Local infection of the skin and subcutaneous tissue, unspecified: Secondary | ICD-10-CM | POA: Diagnosis not present

## 2022-05-31 LAB — BASIC METABOLIC PANEL
Anion gap: 6 (ref 5–15)
BUN: 28 mg/dL — ABNORMAL HIGH (ref 8–23)
CO2: 20 mmol/L — ABNORMAL LOW (ref 22–32)
Calcium: 8.6 mg/dL — ABNORMAL LOW (ref 8.9–10.3)
Chloride: 115 mmol/L — ABNORMAL HIGH (ref 98–111)
Creatinine, Ser: 1.24 mg/dL — ABNORMAL HIGH (ref 0.44–1.00)
GFR, Estimated: 43 mL/min — ABNORMAL LOW (ref >60)
Glucose, Bld: 168 mg/dL — ABNORMAL HIGH (ref 70–99)
Potassium: 4.5 mmol/L (ref 3.5–5.1)
Sodium: 141 mmol/L (ref 135–145)

## 2022-05-31 LAB — CBC
HCT: 32.5 % — ABNORMAL LOW (ref 36.0–46.0)
Hemoglobin: 10.4 g/dL — ABNORMAL LOW (ref 12.0–15.0)
MCH: 29 pg (ref 26.0–34.0)
MCHC: 32 g/dL (ref 30.0–36.0)
MCV: 90.5 fL (ref 80.0–100.0)
Platelets: 355 10*3/uL (ref 150–400)
RBC: 3.59 MIL/uL — ABNORMAL LOW (ref 3.87–5.11)
RDW: 15.6 % — ABNORMAL HIGH (ref 11.5–15.5)
WBC: 13.4 10*3/uL — ABNORMAL HIGH (ref 4.0–10.5)
nRBC: 0 % (ref 0.0–0.2)

## 2022-05-31 LAB — GLUCOSE, CAPILLARY
Glucose-Capillary: 104 mg/dL — ABNORMAL HIGH (ref 70–99)
Glucose-Capillary: 110 mg/dL — ABNORMAL HIGH (ref 70–99)
Glucose-Capillary: 193 mg/dL — ABNORMAL HIGH (ref 70–99)

## 2022-05-31 MED ORDER — ASPIRIN 81 MG PO TBEC
81.0000 mg | DELAYED_RELEASE_TABLET | Freq: Every day | ORAL | Status: DC
  Administered 2022-05-31: 81 mg via ORAL
  Filled 2022-05-31: qty 1

## 2022-05-31 MED ORDER — HYDROCODONE-ACETAMINOPHEN 5-325 MG PO TABS: 1.0000 | ORAL_TABLET | ORAL | 0 refills | Status: DC | PRN

## 2022-05-31 MED ORDER — CEFEPIME HCL 2 G IV SOLR
2.0000 g | Freq: Two times a day (BID) | INTRAVENOUS | Status: DC
  Administered 2022-05-31: 2 g via INTRAVENOUS
  Filled 2022-05-31 (×2): qty 12.5

## 2022-05-31 MED ORDER — DOXYCYCLINE HYCLATE 100 MG PO TABS: 100.0000 mg | ORAL_TABLET | Freq: Two times a day (BID) | ORAL | 0 refills | Status: AC

## 2022-05-31 MED ORDER — CLOPIDOGREL BISULFATE 75 MG PO TABS
75.0000 mg | ORAL_TABLET | Freq: Every day | ORAL | Status: DC
  Administered 2022-05-31: 75 mg via ORAL
  Filled 2022-05-31: qty 1

## 2022-05-31 NOTE — Progress Notes (Signed)
Noted with scattered patchy redness on BUE, lower part of neck, around nape area, bilateral groin and pubic areas.  No elevation noted on reddened areas.  States, "I am not itching right now, every now and then I do".  Denies any need at this time.

## 2022-05-31 NOTE — TOC Initial Note (Signed)
Transition of Care [facility]) - Initial/Assessment Note    Patient Details  Name: Beth Mcguire MRN: [id number] Date of Birth: January 05, 1941  Transition of Care [facility]) CM/SW Contact:    Beverly Sessions, RN Phone Number: 05/31/2022, 2:56 PM  Clinical Narrative:                  Patient to discharge home today Lives at home with daughter.  Has all needed DME for home Recently closed with [facility] home health patient is agreeable for services with them again. Referral made to [facility] with [facility]  Daughter to transport at discharge        Patient Goals and CMS Choice        Expected Discharge Plan and Services           Expected Discharge Date: 05/31/22                                    Prior Living Arrangements/Services                       Activities of Daily Living Home Assistive Devices/Equipment: Gilford Rile (specify type) ADL Screening (condition at time of admission) Patient's cognitive ability adequate to safely complete daily activities?: Yes Is the patient deaf or have difficulty hearing?: No Does the patient have difficulty seeing, even when wearing glasses/contacts?: No Does the patient have difficulty concentrating, remembering, or making decisions?: No Patient able to express need for assistance with ADLs?: Yes Does the patient have difficulty dressing or bathing?: Yes Independently performs ADLs?: No Communication: Independent Dressing (OT): Needs assistance Is this a change from baseline?: Pre-admission baseline Grooming: Needs assistance Is this a change from baseline?: Pre-admission baseline Feeding: Independent Bathing: Needs assistance Is this a change from baseline?: Pre-admission baseline Toileting: Needs assistance Is this a change from baseline?: Pre-admission baseline In/Out Bed: Needs assistance Is this a change from baseline?: Pre-admission baseline Walks in Home: Independent with device (comment) Does the patient have  difficulty walking or climbing stairs?: Yes Weakness of Legs: Both Weakness of Arms/Hands: None  Permission Sought/Granted                  Emotional Assessment              Admission diagnosis:  Diabetic foot infection ([place]) [V37.106, L08.9] Patient Active Problem List   Diagnosis Date Noted   Chronic kidney disease (CKD), stage III (moderate) ([place]) 05/30/2022   Dementia with behavioral disturbance ([place]) 05/30/2022   Diabetic foot infection ([place]) 05/29/2022   Diabetic foot ulcer ([place]) 02/09/2022   Rash 11/26/2021   PAD (peripheral artery disease) ([place]) 09/18/2017   Pure hypercholesterolemia 08/13/2017   Hyperlipidemia 07/25/2016   Hypertension 07/25/2016   Type 2 diabetes mellitus with chronic kidney disease, with long-term current use of insulin ([place]) 07/25/2016   Atherosclerosis of native arteries of the extremities with ulceration ([place]) 07/25/2016   Foot osteomyelitis, right ([place]) 01/12/2016   Osteomyelitis ([place]) 01/12/2016   Toe ulcer due to DM ([place]) 01/04/2016   PCP:  [name], MD Pharmacy:   [facility] [place], [place] - [address] [place] [address] Phone: [phone] Fax: [facility] #[id number] [address], [place] AT [facility] OF [address] & [place] [address] Phone: [phone] Fax:  [phone]     Social Determinants of Health (SDOH) Interventions    Readmission Risk Interventions     No data to display

## 2022-05-31 NOTE — Evaluation (Addendum)
Physical Therapy Evaluation Patient Details Name: Beth Mcguire MRN: [id number] DOB: [birth year] Today's Date: 05/31/2022  History of Present Illness  Beth Mcguire is a 81 y.o. female with medical history significant of anxiety, DM2, HTN, dementia, h/o colon cancer who presents for a non healing diabetic foot wound.  She has been seeing Dr. Vickki Muff in Podiatry.  She has been on antibiotics.  She also follows with vascular surgery and has been found to have mild-mod PVD.  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 Heel weight bearing on post op shoe.   Clinical Impression  Patient received in recliner, daughter at bedside. She is agreeable to PT assessment. Patient is able to stand from recliner with supervision. Ambulated 25 feet with RW and min guard. Post op shoe donned for ambulation. She appears to be close to baseline mobility per daughter. Has 24/7 assist at home. She will benefit from continued skilled PT while here to maximize functional independence.         Recommendations for follow up therapy are one component of a multi-disciplinary discharge planning process, led by the attending physician.  Recommendations may be updated based on patient status, additional functional criteria and insurance authorization.  Follow Up Recommendations Home health PT      Assistance Recommended at Discharge Frequent or constant Supervision/Assistance  Patient can return home with the following  A little help with walking and/or transfers;A little help with bathing/dressing/bathroom;Help with stairs or ramp for entrance;Assist for transportation;Assistance with cooking/housework    Equipment Recommendations None recommended by PT  Recommendations for Other Services       Functional Status Assessment Patient has had a recent decline in their functional status and demonstrates the ability to make significant improvements in function in a reasonable and predictable amount of time.     Precautions /  Restrictions Precautions Precautions: Fall Restrictions Weight Bearing Restrictions: Yes LLE Weight Bearing: Weight bearing as tolerated Other Position/Activity Restrictions: Heel weight bearing in post op shoe      Mobility  Bed Mobility               General bed mobility comments: NT, patient in recliner and remained up in recliner    Transfers Overall transfer level: Modified independent Equipment used: Rolling walker (2 wheels)                    Ambulation/Gait Ambulation/Gait assistance: Min guard Gait Distance (Feet): 25 Feet Assistive device: Rolling walker (2 wheels) Gait Pattern/deviations: Step-to pattern, Decreased step length - right, Decreased step length - left, Decreased stride length, Trunk flexed Gait velocity: decr     General Gait Details: patient without lob, no pain reported  Stairs            Wheelchair Mobility    Modified Rankin (Stroke Patients Only)       Balance Overall balance assessment: Mild deficits observed, not formally tested, Needs assistance Sitting-balance support: Feet supported Sitting balance-Leahy Scale: Good     Standing balance support: Bilateral upper extremity supported, During functional activity, Reliant on assistive device for balance Standing balance-Leahy Scale: Fair                               Pertinent Vitals/Pain Pain Assessment Pain Assessment: No/denies pain    Home Living Family/patient expects to be discharged to:: Private residence Living Arrangements: Children Available Help at Discharge: Family;Available 24 hours/day Type of Home: [place]  Access: Ramped entrance       Home Layout: One level Home Equipment: Conservation officer, nature (2 wheels);Wheelchair - manual;Shower seat - built in;BSC/3in1;Shower seat;Hand held shower head      Prior Function Prior Level of Function : Independent/Modified Independent             Mobility Comments: ambulatory with RW and  family there with her while walking. When goes out of home uses W/C with family assist ADLs Comments: pt dresses herself, family assists if needed. refuses bathing at home per daughter. Dtr assists with all meds, meals, cleaning, and son in law provides transportation     Hand Dominance        Extremity/Trunk Assessment   Upper Extremity Assessment Upper Extremity Assessment: Defer to OT evaluation    Lower Extremity Assessment Lower Extremity Assessment: Generalized weakness    Cervical / Trunk Assessment Cervical / Trunk Assessment: Normal  Communication   Communication: No difficulties  Cognition Arousal/Alertness: Awake/alert Behavior During Therapy: WFL for tasks assessed/performed Overall Cognitive Status: Within Functional Limits for tasks assessed                                          General Comments      Exercises     Assessment/Plan    PT Assessment Patient needs continued PT services  PT Problem List Decreased strength;Decreased mobility;Decreased activity tolerance;Decreased balance       PT Treatment Interventions DME instruction;Therapeutic exercise;Gait training;Functional mobility training;Therapeutic activities;Patient/family education    PT Goals (Current goals can be found in the Care Plan section)  Acute Rehab PT Goals Patient Stated Goal: to return home PT Goal Formulation: With patient/family Time For Goal Achievement: 06/02/22 Potential to Achieve Goals: Good    Frequency Min 2X/week     Co-evaluation               AM-PAC PT "6 Clicks" Mobility  Outcome Measure Help needed turning from your back to your side while in a flat bed without using bedrails?: A Little Help needed moving from lying on your back to sitting on the side of a flat bed without using bedrails?: A Little Help needed moving to and from a bed to a chair (including a wheelchair)?: A Little Help needed standing up from a chair using your arms  (e.g., wheelchair or bedside chair)?: A Little Help needed to walk in hospital room?: A Little Help needed climbing 3-5 steps with a railing? : A Little 6 Click Score: 18    End of Session Equipment Utilized During Treatment: Gait belt;Other (comment) (post op shoe) Activity Tolerance: Patient tolerated treatment well Patient left: in chair;with call bell/phone within reach;with family/visitor present Nurse Communication: Mobility status PT Visit Diagnosis: Muscle weakness (generalized) (M62.81);Other abnormalities of gait and mobility (R26.89)    Time: 1325-1345 PT Time Calculation (min) (ACUTE ONLY): 20 min   Charges:   PT Evaluation $PT Eval Low Complexity: 1 Low PT Treatments $Gait Training: 8-22 mins        Tanyia Grabbe, PT, GCS 05/31/22,2:10 PM

## 2022-05-31 NOTE — Evaluation (Signed)
Occupational Therapy Evaluation Patient Details Name: Beth Mcguire MRN: [id number] DOB: 10/25/1940 Today's Date: 05/31/2022   History of Present Illness Beth Mcguire is a 81 y.o. female with medical history significant of anxiety, DM2, HTN, dementia, h/o colon cancer who presents for a non healing diabetic foot wound.  She has been seeing Dr. Vickki Muff in Podiatry.  She has been on antibiotics.  She also follows with vascular surgery and has been found to have mild-mod PVD.  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 Heel weight bearing on post op shoe.   Clinical Impression   Pt received POD#1 after amputation of L 3rd toe. Pt has been IND in most ADLs at home, with daughter assisting with dressing, toileting, and all IADLs. Pt walks with a RW, reports no falls in previous 6 months. During evaluation today, she denies pain, requires Min A for dressing, CGA for bed mobility and transfers. Provided educ re: weightbearing restrictions, donning and doffing post-surgical boot. Pt verbalizes understanding. Pt and family report that they feel confident in being able to handle any pt care needs at home. Pt was at [facility] in May 2023 for amputation of L 2nd toe, so family is familiar with post-surgery care. No further OT services required at this time; will sign off.    Recommendations for follow up therapy are one component of a multi-disciplinary discharge planning process, led by the attending physician.  Recommendations may be updated based on patient status, additional functional criteria and insurance authorization.   Follow Up Recommendations  No OT follow up    Assistance Recommended at Discharge Intermittent Supervision/Assistance  Patient can return home with the following A little help with walking and/or transfers;A little help with bathing/dressing/bathroom;Assistance with cooking/housework    Functional Status Assessment  Patient has not had a recent decline in their functional status  Equipment  Recommendations       Recommendations for Other Services       Precautions / Restrictions Precautions Precautions: Fall Precaution Comments: Mod fall risk Restrictions Weight Bearing Restrictions: Yes LLE Weight Bearing: Weight bearing as tolerated Other Position/Activity Restrictions: Heel weight bearing in post op shoe      Mobility Bed Mobility Overal bed mobility: Needs Assistance Bed Mobility: Supine to Sit, Sit to Supine     Supine to sit: Min guard Sit to supine: Min guard        Transfers Overall transfer level: Needs assistance Equipment used: Rolling walker (2 wheels) Transfers: Sit to/from Stand Sit to Stand: Min guard                  Balance Overall balance assessment: Mild deficits observed, not formally tested, Needs assistance Sitting-balance support: Feet supported Sitting balance-Leahy Scale: Good     Standing balance support: Bilateral upper extremity supported, During functional activity, Reliant on assistive device for balance Standing balance-Leahy Scale: Fair                             ADL either performed or assessed with clinical judgement   ADL Overall ADL's : Needs assistance/impaired                 Upper Body Dressing : Minimal assistance Upper Body Dressing Details (indicate cue type and reason): Min A for pulling down shirt in back Lower Body Dressing: Minimal assistance Lower Body Dressing Details (indicate cue type and reason): Pt can don pants INDly, requires Min A for donning shoe  and post-surgical boot             Functional mobility during ADLs: Rolling walker (2 wheels)       Vision         Perception     Praxis      Pertinent Vitals/Pain Pain Assessment Pain Assessment: No/denies pain     Hand Dominance     Extremity/Trunk Assessment Upper Extremity Assessment Upper Extremity Assessment: Overall WFL for tasks assessed   Lower Extremity Assessment Lower Extremity  Assessment: Generalized weakness   Cervical / Trunk Assessment Cervical / Trunk Assessment: Normal   Communication Communication Communication: No difficulties   Cognition Arousal/Alertness: Awake/alert Behavior During Therapy: WFL for tasks assessed/performed Overall Cognitive Status: Within Functional Limits for tasks assessed                                       General Comments       Exercises Other Exercises Other Exercises: Educ re: post-surgical precautions, falls prevention, bathing techniques   Shoulder Instructions      Home Living Family/patient expects to be discharged to:: Private residence Living Arrangements: Children Available Help at Discharge: Family;Available 24 hours/day Type of Home: House Home Access: Ramped entrance     Home Layout: One level     Bathroom Shower/Tub: Occupational psychologist: Handicapped height Bathroom Accessibility: No   Home Equipment: Conservation officer, nature (2 wheels);Wheelchair - manual;Shower seat - built in;BSC/3in1;Shower seat;Hand held shower head          Prior Functioning/Environment Prior Level of Function : Needs assist             Mobility Comments: ambulatory with RW and family there with her while walking. When goes out of home uses W/C with family assist. No falls in previous 6 months. ADLs Comments: Pt mostly dresses herself, needs assist for shoes. Pt does not bathe at baseline. Daughter assists with meds, meals, cleaning.        OT Problem List: Decreased activity tolerance;Impaired balance (sitting and/or standing);Impaired sensation      OT Treatment/Interventions:      OT Goals(Current goals can be found in the care plan section) Acute Rehab OT Goals Patient Stated Goal: to go home OT Goal Formulation: With patient Time For Goal Achievement: 06/14/22 Potential to Achieve Goals: Good  OT Frequency:      Co-evaluation              AM-PAC OT "6 Clicks" Daily  Activity     Outcome Measure Help from another person eating meals?: None Help from another person taking care of personal grooming?: None Help from another person toileting, which includes using toliet, bedpan, or urinal?: A Little Help from another person bathing (including washing, rinsing, drying)?: A Little Help from another person to put on and taking off regular upper body clothing?: A Little Help from another person to put on and taking off regular lower body clothing?: A Little 6 Click Score: 20   End of Session Equipment Utilized During Treatment: Rolling walker (2 wheels)  Activity Tolerance: Patient tolerated treatment well Patient left: in bed;with family/visitor present;with call bell/phone within reach  OT Visit Diagnosis: Muscle weakness (generalized) (M62.81);Other abnormalities of gait and mobility (R26.89);Unsteadiness on feet (R26.81)                Time: 1962-2297 OT Time Calculation (min): 8 min Charges:  OT General Charges $OT Visit: 1 Visit OT Evaluation $OT Eval Low Complexity: 1 Low OT Treatments $Self Care/Home Management : 8-22 mins Josiah Lobo, PhD, MS, OTR/L 05/31/22, 3:39 PM

## 2022-05-31 NOTE — Progress Notes (Addendum)
Discharge instructions given to the patient and her daughter Horris Latino.  Education emphasized on signs of infection to watch for and importance of completing antibiotic treatment.  Both verbalized understanding.  Left foot ace wrap dressing CDI.  Changed by Dr. Cleda Mccreedy this morning.  Questions addressed.  Encouraged to call the doctor for questions.  Discharged home.

## 2022-05-31 NOTE — Discharge Summary (Signed)
Physician Discharge Summary   Patient: Beth Mcguire MRN: [id number] DOB: 1941/08/15  Admit date:     05/29/2022  Discharge date: 05/31/22  Discharge Physician: Lorella Nimrod   PCP: [name], MD   Recommendations at discharge:  Patient is being given 1 week of doxycycline-Ensure for completion of course. Follow-up with podiatry in 1 week. Follow-up with PCP  Discharge Diagnoses: Principal Problem:   Diabetic foot infection ([place]) Active Problems:   Toe ulcer due to DM ([place])   Osteomyelitis ([place])   PAD (peripheral artery disease) ([place])   Type 2 diabetes mellitus with chronic kidney disease, with long-term current use of insulin ([place])   Hypertension   Hyperlipidemia   Chronic kidney disease (CKD), stage III (moderate) (HCC)   Dementia with behavioral disturbance [facility])   Hospital Course: Taken from H&P.   Beth Mcguire is a 81 y.o. female with medical history significant of anxiety, DM2, HTN, dementia, h/o colon cancer who presents for a non healing diabetic foot wound.  She has been seeing Dr. Vickki Muff in Podiatry.  She has been on antibiotics.  She also follows with vascular surgery and has been found to have mild-mod PVD.  She has no symptoms today, no other complaints.  Her foot does not hurt.  She notes that she was seen in clinic today for a non healing 3rd left toe ulcer on the tip of the foot and asked to be admitted to the hospital.  Labs with mild leukocytosis.  Priti albumin low at 13, BUN 38 and creatinine of 1.31 with baseline around 1.  ESR and CRP within normal limit, A1c at 7.4 X-ray concerning for osteomyelitis of third left toe. ABI was ordered and podiatry was consulted. She was started on Zosyn.  8/23: Patient remained stable and will be going for third toe amputation to the OR later today.  8/24: Patient had her left third toe amputation with podiatry yesterday.  Tolerated the procedure well.  Labs seems stable. Pain well controlled.  PT is recommending  home health services.  Discussed with podiatry and she can be discharged home for 1 week of antibiotics with doxycycline. Currently had an allergic reaction to Zosyn with some rash last night requiring Benadryl.  Home health services were ordered and patient is being discharged home to follow-up with podiatry.  She will continue on current medications and follow-up with his providers.  Assessment and Plan: Toe ulcer due to DM [facility]) Patient with chronic diabetic ulcer of third left toe, imaging concerning for osteomyelitis. Artery was consulted and she will be going to the OR for amputation later today. -Continue with Zosyn  Osteomyelitis ([place]) Please see above  PAD (peripheral artery disease) ([place]) Holding home dose of aspirin and Plavix for the need of surgery. -Can restart once cleared from surgery  Type 2 diabetes mellitus with chronic kidney disease, with long-term current use of insulin ([place]) Patient had 1 episode of hypoglycemia as she was n.p.o.  Resolved with food.  A1c of 7.4 -Started on IV fluids with D5 while waiting for surgery. -Continue with SSI  Hypertension - Continue home lisinopril  Hyperlipidemia - Continue home statin  Chronic kidney disease (CKD), stage III (moderate) (HCC) Slight worsening of creatinine with history of CKD stage IIIa. -IV fluid -Monitor renal function -Avoid nephrotoxins  Dementia with behavioral disturbance (HCC) - Continue Seroquel -Delirium precautions   Pain control - [place] Controlled Substance Reporting System database was reviewed. and patient was instructed, not to drive, operate heavy  machinery, perform activities at heights, swimming or participation in water activities or provide baby-sitting services while on Pain, Sleep and Anxiety Medications; until their outpatient Physician has advised to do so again. Also recommended to not to take more than prescribed Pain, Sleep and Anxiety Medications.  Consultants:  Podiatry Procedures performed: Left third toe amputation Disposition: Home health Diet recommendation:  Discharge Diet Orders (From admission, onward)     Start     Ordered   05/31/22 0000  Diet - low sodium heart healthy        05/31/22 1431           Cardiac and Carb modified diet DISCHARGE MEDICATION: Allergies as of 05/31/2022       Reactions   Amoxicillin-pot Clavulanate Rash   Zosyn [piperacillin Sod-tazobactam So] Rash        Medication List     TAKE these medications    aspirin 81 MG tablet Take 1 tablet (81 mg total) by mouth daily.   atorvastatin 10 MG tablet Commonly known as: LIPITOR Take 10 mg every evening by mouth.   B-D ULTRAFINE III SHORT PEN 31G X 8 MM Misc Generic drug: Insulin Pen Needle   clopidogrel 75 MG tablet Commonly known as: PLAVIX TAKE 1 TABLET BY MOUTH EVERY DAY   doxycycline 100 MG tablet Commonly known as: VIBRA-TABS Take 1 tablet (100 mg total) by mouth 2 (two) times daily for 7 days.   glucose monitoring kit monitoring kit 1 each by Does not apply route 4 (four) times daily -  before meals and at bedtime.   ONE TOUCH ULTRA MINI w/Device Kit   HYDROcodone-acetaminophen 5-325 MG tablet Commonly known as: NORCO/VICODIN Take 1 tablet by mouth every 4 (four) hours as needed for moderate pain. What changed: when to take this   lisinopril 20 MG tablet Commonly known as: ZESTRIL Take 20 mg by mouth daily.   metFORMIN 500 MG 24 hr tablet Commonly known as: GLUCOPHAGE-XR Take 500 mg by mouth daily with supper.   NovoLOG Mix 70/30 FlexPen (70-30) 100 UNIT/ML FlexPen Generic drug: insulin aspart protamine - aspart Inject 25 Units into the skin 2 (two) times daily after a meal.   ONE TOUCH ULTRA TEST test strip Generic drug: glucose blood   OneTouch Delica Lancets 28Z Misc   QUEtiapine 50 MG tablet Commonly known as: SEROQUEL Take 50 mg by mouth at bedtime.               Discharge Care Instructions  (From  admission, onward)           Start     Ordered   05/31/22 0000  Leave dressing on - Keep it clean, dry, and intact until clinic visit        05/31/22 1431            Follow-up Information     Sharlotte Alamo, DPM. Schedule an appointment as soon as possible for a visit in 1 week(s).   Specialty: Podiatry Contact information: [place] [address] [phone]         [name], MD. Schedule an appointment as soon as possible for a visit in 1 week(s).   Specialty: Family Medicine Contact information: [address] [facility] [place] [address] [phone]                Discharge Exam: Danley Danker Weights   05/29/22 2053  Weight: 63.6 kg   General.  In no acute distress. Pulmonary.  Lungs clear bilaterally, normal respiratory effort. CV.  Regular rate and rhythm, no JVD, rub or murmur. Abdomen.  Soft, nontender, nondistended, BS positive. CNS.  Alert and oriented .  No focal neurologic deficit. Extremities.  No edema, no cyanosis, pulses intact and symmetrical.  Left foot with clean bandage. Psychiatry.  Judgment and insight appears normal.   Condition at discharge: stable  The results of significant diagnostics from this hospitalization (including imaging, microbiology, ancillary and laboratory) are listed below for reference.   Imaging Studies: US ARTERIAL ABI (SCREENING LOWER EXTREMITY)  Result Date: 05/30/2022 CLINICAL DATA:  History of peripheral arterial disease and toe wounds EXAM: NONINVASIVE PHYSIOLOGIC VASCULAR STUDY OF BILATERAL LOWER EXTREMITIES TECHNIQUE: Evaluation of both lower extremities were performed at rest, including calculation of ankle-brachial indices with single level Doppler, pressure and pulse volume recording. COMPARISON:  Prior ankle-brachial indices 02/10/2022 FINDINGS: Right ABI:  0.59 (previously 0.57) Left ABI:  0.70 (previously 0.66) Right Lower Extremity:  Abnormal monophasic arterial waveforms at the ankle. Left Lower Extremity: Abnormal monophasic arterial waveforms at the ankle. 0.5-0.79 Moderate PAD IMPRESSION: 1. Abnormal bilateral resting ankle-brachial indices consistent with at least moderate underlying peripheral arterial disease worse on the right than the left. 2. No significant interval change compared to 02/10/2022. Signed, Criselda Peaches, MD, [place] [facility] Radiology Electronically Signed   By: Jacqulynn Cadet M.D.   On: 05/30/2022 16:31   DG Foot 2 Views Left  Result Date: 05/29/2022 CLINICAL DATA:  Diabetic foot infection. EXAM: LEFT FOOT - 2 VIEW COMPARISON:  Radiographs 02/09/2022 FINDINGS: Heterogeneously decreased density involving the third toe distal tuft suspicious for osteomyelitis. There is been prior transmetatarsal amputation of the first ray and resection of the second toe at the proximal phalanx. There is section margins are smooth. There is marked soft tissue thickening and edema overlying the dorsum of the forefoot. No definite soft tissue gas. IMPRESSION: 1. Findings suspicious for osteomyelitis of the third toe distal tuft. 2. Marked soft tissue thickening and edema overlying the dorsum of the forefoot. No definite soft tissue gas. 3. Prior transmetatarsal amputation of the first ray and resection of the second toe at the proximal phalanx. Electronically Signed   By: Keith Rake M.D.   On: 05/29/2022 22:38    Microbiology: Results for orders placed or performed during the hospital encounter of 02/09/22  Culture, blood (routine x 2)     Status: None   Collection Time: 02/09/22  7:17 PM   Specimen: BLOOD  Result Value Ref Range Status   Specimen Description BLOOD RIGHT ANTECUBITAL  Final   Special Requests   Final    BOTTLES DRAWN AEROBIC AND ANAEROBIC Blood Culture adequate volume   Culture   Final    NO GROWTH 5 DAYS Performed at [facility], [address] [place], [place] [address]    Report Status 02/14/2022 FINAL  Final  Culture, blood (routine x 2)     Status: None   Collection Time: 02/09/22  7:23 PM   Specimen: BLOOD  Result Value Ref Range Status   Specimen Description BLOOD BLOOD RIGHT HAND  Final   Special Requests   Final    BOTTLES DRAWN AEROBIC AND ANAEROBIC Blood Culture adequate volume   Culture   Final    NO GROWTH 5 DAYS Performed at [facility], [address] [place], [place] [address]    Report Status 02/14/2022 FINAL  Final  Surgical  pcr screen     Status: None   Collection Time: 02/10/22  4:30 PM   Specimen: Nasal Mucosa; Nasal Swab  Result Value Ref Range Status   MRSA, PCR NEGATIVE NEGATIVE Final   Staphylococcus aureus NEGATIVE NEGATIVE Final    Comment: (NOTE) The Xpert SA Assay (FDA approved for NASAL specimens in patients 87 years of age and older), is one component of a comprehensive surveillance program. It is not intended to diagnose infection nor to guide or monitor treatment. Performed at [facility], [address] [place], [place] [address]   Aerobic/Anaerobic Culture w Gram Stain (surgical/deep wound)     Status: None   Collection Time: 02/11/22  9:35 AM   Specimen: PATH Benign ortho; Tissue  Result Value Ref Range Status   Specimen Description   Final    TISSUE Performed at [facility], [address] [place], [place] [address]    Special Requests   Final    OSTEOMYELITITS LEFT 2ND TOE Performed at [facility], [place], [place] [address]    Gram Stain   Final    NO SQUAMOUS EPITHELIAL CELLS SEEN RARE WBC SEEN NO ORGANISMS SEEN    Culture   Final    RARE STAPHYLOCOCCUS SIMULANS RARE STAPHYLOCOCCUS LUGDUNENSIS RARE PROPIONIBACTERIUM ACNES Standardized susceptibility testing for this organism is not available. NO ANAEROBES ISOLATED Performed at [facility], [place] [address] [place], [place] [address]     Report Status 02/16/2022 FINAL  Final   Organism ID, Bacteria STAPHYLOCOCCUS SIMULANS  Final   Organism ID, Bacteria STAPHYLOCOCCUS LUGDUNENSIS  Final      Susceptibility   Staphylococcus simulans - MIC*    CIPROFLOXACIN <=0.5 SENSITIVE Sensitive     ERYTHROMYCIN <=0.25 SENSITIVE Sensitive     GENTAMICIN <=0.5 SENSITIVE Sensitive     OXACILLIN <=0.25 SENSITIVE Sensitive     TETRACYCLINE <=1 SENSITIVE Sensitive     VANCOMYCIN <=0.5 SENSITIVE Sensitive     TRIMETH/SULFA <=10 SENSITIVE Sensitive     CLINDAMYCIN <=0.25 SENSITIVE Sensitive     RIFAMPIN <=0.5 SENSITIVE Sensitive     Inducible Clindamycin NEGATIVE Sensitive     * RARE STAPHYLOCOCCUS SIMULANS   Staphylococcus lugdunensis - MIC*    CIPROFLOXACIN <=0.5 SENSITIVE Sensitive     ERYTHROMYCIN <=0.25 SENSITIVE Sensitive     GENTAMICIN <=0.5 SENSITIVE Sensitive     OXACILLIN 2 SENSITIVE Sensitive     TETRACYCLINE <=1 SENSITIVE Sensitive     VANCOMYCIN <=0.5 SENSITIVE Sensitive     TRIMETH/SULFA <=10 SENSITIVE Sensitive     CLINDAMYCIN <=0.25 SENSITIVE Sensitive     RIFAMPIN <=0.5 SENSITIVE Sensitive     Inducible Clindamycin NEGATIVE Sensitive     * RARE STAPHYLOCOCCUS LUGDUNENSIS    Labs: CBC: Recent Labs  Lab 05/29/22 2146 05/30/22 0529 05/31/22 1007  WBC 9.5 11.3* 13.4*  NEUTROABS 5.8  --   --   HGB 10.4* 10.4* 10.4*  HCT 32.4* 32.5* 32.5*  MCV 88.3 88.6 90.5  PLT 355 362 509   Basic Metabolic Panel: Recent Labs  Lab 05/29/22 2146 05/31/22 1007  NA 139 141  K 4.6 4.5  CL 111 115*  CO2 23 20*  GLUCOSE 147* 168*  BUN 38* 28*  CREATININE 1.31* 1.24*  CALCIUM 9.1 8.6*   Liver Function Tests: Recent Labs  Lab 05/29/22 2146  AST 13*  ALT 8  ALKPHOS 131*  BILITOT 0.5  PROT 6.1*  ALBUMIN 3.1*   CBG: Recent Labs  Lab  05/30/22 1801 05/30/22 1858 05/30/22 2024 05/31/22 0735 05/31/22 1135  GLUCAP 104* 97 123* 110* 193*    Discharge time spent: greater than 30 minutes.  This record has been  created using Systems analyst. Errors have been sought and corrected,but may not always be located. Such creation errors do not reflect on the standard of care.   Signed: Lorella Nimrod, MD [facility] 05/31/2022

## 2022-05-31 NOTE — Progress Notes (Addendum)
Pharmacy Antibiotic Note  Beth Mcguire is a 81 y.o. female admitted on 05/29/2022 with a non-healing diabetic foot infection. Pt was taking doxycyline 100 BID PTA for this. Pt has a history of mild to moderate PVD. Xray of foot shows possible OM of third toe. Pt has prior amputation of big toe and resection of second toe. Pt was initially started on Zosyn but developed a rash and was switched to cefepime and metronidazole. Infected toe was amputated 8/23. Pharmacy has been consulted for Cefepime dosing.  Pt was originally on cefepime 2 g q24h for a CrCl between 10-30. Cr 1.31>>1.24, baseline 0.95, and CrCl now above 30. Has received fluid hydration so expect creatinine to continue to improve back to her baseline of Scr 1.0-1.2.   Plan: Changing to cefepime 2 gm q12h per indication & renal fxn  Continue metronidazole 500 mg BID  Pharmacy will continue to follow and will adjust abx dosing whenever warranted.  Height: '5\' 2"'$  (157.5 cm) Weight: 63.6 kg (140 lb 3.4 oz) IBW/kg (Calculated) : 50.1  Temp (24hrs), Avg:98.6 F (37 C), Min:97.7 F (36.5 C), Max:100 F (37.8 C)   Recent Labs  Lab 05/29/22 2146 05/30/22 0529 05/31/22 1007  WBC 9.5 11.3* 13.4*  CREATININE 1.31*  --  1.24*     Estimated Creatinine Clearance: 30.1 mL/min (A) (by C-G formula based on SCr of 1.24 mg/dL (H)).    Allergies  Allergen Reactions   Amoxicillin-Pot Clavulanate Rash   Zosyn [Piperacillin Sod-Tazobactam So] Rash    Antimicrobials this admission: 8/23 Cefepime >>  8/23 Flagyl >>  8/22 Zosyn >> 8/23 - Developed Rash  Microbiology results: 8/23 WoundCx: Pending  Thank you for allowing pharmacy to be a part of this patient's care.  Rodolph Bong, PharmD Candidate 05/31/2022 11:47 AM

## 2022-05-31 NOTE — Progress Notes (Signed)
Mobility Specialist - Progress Note    05/31/22 1155  Mobility  Activity Ambulated with assistance in room;Transferred from bed to chair  Level of Assistance Contact guard assist, steadying assist  Assistive Device Front wheel walker  Distance Ambulated (ft) 4 ft  Activity Response Tolerated well  $Mobility charge 1 Mobility   Pt supine upon entry, utilizing RA. Pt was able to complete bed mobility. Pt transferred to chair using RW with CGA. Pt left sitting in chair with needs in reach. Family present at bedside. No complaints.   [place] Mobility Specialist 05/31/22 11:58 AM

## 2022-05-31 NOTE — Progress Notes (Signed)
1 Day Post-Op   Subjective/Chief Complaint: Patient seen.  No complaints of any pain or issues.   Objective: Vital signs in last 24 hours: Temp:  [97.7 F (36.5 C)-100 F (37.8 C)] 98.6 F (37 C) (08/24 0734) Pulse Rate:  [60-78] 77 (08/24 0734) Resp:  [15-19] 18 (08/24 0734) BP: (119-163)/(47-80) 119/47 (08/24 0734) SpO2:  [98 %-100 %] 98 % (08/24 0734) Last BM Date : 05/30/22  Intake/Output from previous day: 08/23 0701 - 08/24 0700 In: 1292.8 [P.O.:360; I.V.:680.4; IV Piggyback:252.5] Out: 5 [Blood:5] Intake/Output this shift: Total I/O In: 517.6 [P.O.:240; I.V.:177.6; IV Piggyback:100] Out: -   Bandage on the left foot is dry and intact.  Upon removal there is some mild dried and active bleeding.  No signs of purulence.  The incision is well coapted with no significant erythema.  Wound edges appear viable   Lab Results:  Recent Labs    05/30/22 0529 05/31/22 1007  WBC 11.3* 13.4*  HGB 10.4* 10.4*  HCT 32.5* 32.5*  PLT 362 355   BMET Recent Labs    05/29/22 2146 05/31/22 1007  NA 139 141  K 4.6 4.5  CL 111 115*  CO2 23 20*  GLUCOSE 147* 168*  BUN 38* 28*  CREATININE 1.31* 1.24*  CALCIUM 9.1 8.6*   PT/INR Recent Labs    05/30/22 0529  LABPROT 14.1  INR 1.1   ABG No results for input(s): "PHART", "HCO3" in the last 72 hours.  Invalid input(s): "PCO2", "PO2"  Studies/Results: US ARTERIAL ABI (SCREENING LOWER EXTREMITY)  Result Date: 05/30/2022 CLINICAL DATA:  History of peripheral arterial disease and toe wounds EXAM: NONINVASIVE PHYSIOLOGIC VASCULAR STUDY OF BILATERAL LOWER EXTREMITIES TECHNIQUE: Evaluation of both lower extremities were performed at rest, including calculation of ankle-brachial indices with single level Doppler, pressure and pulse volume recording. COMPARISON:  Prior ankle-brachial indices 02/10/2022 FINDINGS: Right ABI:  0.59 (previously 0.57) Left ABI:  0.70 (previously 0.66) Right Lower Extremity: Abnormal monophasic arterial  waveforms at the ankle. Left Lower Extremity: Abnormal monophasic arterial waveforms at the ankle. 0.5-0.79 Moderate PAD IMPRESSION: 1. Abnormal bilateral resting ankle-brachial indices consistent with at least moderate underlying peripheral arterial disease worse on the right than the left. 2. No significant interval change compared to 02/10/2022. Signed, Criselda Peaches, MD, [place] [facility] Radiology Electronically Signed   By: Jacqulynn Cadet M.D.   On: 05/30/2022 16:31   DG Foot 2 Views Left  Result Date: 05/29/2022 CLINICAL DATA:  Diabetic foot infection. EXAM: LEFT FOOT - 2 VIEW COMPARISON:  Radiographs 02/09/2022 FINDINGS: Heterogeneously decreased density involving the third toe distal tuft suspicious for osteomyelitis. There is been prior transmetatarsal amputation of the first ray and resection of the second toe at the proximal phalanx. There is section margins are smooth. There is marked soft tissue thickening and edema overlying the dorsum of the forefoot. No definite soft tissue gas. IMPRESSION: 1. Findings suspicious for osteomyelitis of the third toe distal tuft. 2. Marked soft tissue thickening and edema overlying the dorsum of the forefoot. No definite soft tissue gas. 3. Prior transmetatarsal amputation of the first ray and resection of the second toe at the proximal phalanx. Electronically Signed   By: Keith Rake M.D.   On: 05/29/2022 22:38    Anti-infectives: Anti-infectives (From admission, onward)    Start     Dose/Rate Route Frequency Ordered Stop   05/31/22 1200  ceFEPIme (MAXIPIME) 2 g in sodium chloride 0.9 % 100 mL IVPB  2 g 200 mL/hr over 30 Minutes Intravenous Every 12 hours 05/31/22 1126     05/30/22 2300  metroNIDAZOLE (FLAGYL) IVPB 500 mg  Status:  Discontinued        500 mg 100 mL/hr over 60 Minutes Intravenous Every 8 hours 05/30/22 2151 05/30/22 2201   05/30/22 2300  ceFEPIme (MAXIPIME) 2 g in  sodium chloride 0.9 % 100 mL IVPB  Status:  Discontinued        2 g 200 mL/hr over 30 Minutes Intravenous Every 24 hours 05/30/22 2201 05/31/22 1126   05/30/22 2300  metroNIDAZOLE (FLAGYL) IVPB 500 mg        500 mg 100 mL/hr over 60 Minutes Intravenous 2 times daily 05/30/22 2201     05/29/22 2200  piperacillin-tazobactam (ZOSYN) IVPB 3.375 g  Status:  Discontinued        3.375 g 12.5 mL/hr over 240 Minutes Intravenous Every 8 hours 05/29/22 2105 05/30/22 2151       Assessment/Plan: s/p Procedure(s): AMPUTATION TOE , LEFT THIRD TOE (Left) Assessment: Stable status post amputation left third toe.  Plan: Betadine gauze bandage reapplied to the left foot followed by Kerlix and an Ace wrap.  Instructed to keep this clean dry and intact.  May weight-bear as tolerated on the left foot wearing surgical shoe.  From podiatry standpoint patient is stable for discharge and would recommend about 1 week course of oral antibiotics.  Amputation should have been curative.  Patient will follow-up outpatient in approximately 1 week.  LOS: 2 days    Durward Fortes 05/31/2022

## 2022-06-01 LAB — SURGICAL PATHOLOGY

## 2022-06-01 NOTE — Anesthesia Postprocedure Evaluation (Signed)
Anesthesia Post Note  Patient: Beth Mcguire  Procedure(s) Performed: AMPUTATION TOE , LEFT THIRD TOE (Left: Toe)  Patient location during evaluation: PACU Anesthesia Type: General Level of consciousness: awake and alert Pain management: pain level controlled Vital Signs Assessment: post-procedure vital signs reviewed and stable Respiratory status: spontaneous breathing, nonlabored ventilation, respiratory function stable and patient connected to nasal cannula oxygen Cardiovascular status: blood pressure returned to baseline and stable Postop Assessment: no apparent nausea or vomiting Anesthetic complications: no   No notable events documented.   Last Vitals:  Vitals:   05/31/22 0354 05/31/22 0734  BP: (!) 126/48 (!) 119/47  Pulse: 78 77  Resp: 18 18  Temp: 37.8 C 37 C  SpO2: 98% 98%    Last Pain:  Vitals:   05/31/22 0734  TempSrc: Oral  PainSc: 0-No pain                 Martha Clan

## 2022-06-05 LAB — AEROBIC/ANAEROBIC CULTURE W GRAM STAIN (SURGICAL/DEEP WOUND)
Culture: NO GROWTH
Gram Stain: NONE SEEN

## 2022-06-12 ENCOUNTER — Ambulatory Visit (INDEPENDENT_AMBULATORY_CARE_PROVIDER_SITE_OTHER): Payer: Medicare HMO | Admitting: Vascular Surgery

## 2022-06-12 ENCOUNTER — Ambulatory Visit (INDEPENDENT_AMBULATORY_CARE_PROVIDER_SITE_OTHER): Payer: Medicare HMO

## 2022-06-12 ENCOUNTER — Encounter (INDEPENDENT_AMBULATORY_CARE_PROVIDER_SITE_OTHER): Payer: Self-pay | Admitting: Vascular Surgery

## 2022-06-12 VITALS — BP 161/76 | HR 73

## 2022-06-12 DIAGNOSIS — I7025 Atherosclerosis of native arteries of other extremities with ulceration: Secondary | ICD-10-CM

## 2022-06-12 DIAGNOSIS — N183 Chronic kidney disease, stage 3 unspecified: Secondary | ICD-10-CM

## 2022-06-12 DIAGNOSIS — E785 Hyperlipidemia, unspecified: Secondary | ICD-10-CM | POA: Diagnosis not present

## 2022-06-12 DIAGNOSIS — Z794 Long term (current) use of insulin: Secondary | ICD-10-CM

## 2022-06-12 DIAGNOSIS — E1122 Type 2 diabetes mellitus with diabetic chronic kidney disease: Secondary | ICD-10-CM | POA: Diagnosis not present

## 2022-06-12 DIAGNOSIS — I10 Essential (primary) hypertension: Secondary | ICD-10-CM | POA: Diagnosis not present

## 2022-06-12 NOTE — Assessment & Plan Note (Signed)
Her ABIs today are 1.04 on the right with biphasic waveforms and 1.07 on the left with monophasic waveforms.  His ABIs may be falsely elevated due to medial calcification, but they are stable and her perfusion appears to be reasonably good at this time.  No role for intervention at this time.  We will plan a short follow-up of 3 to 4 months since she has continued wound issues.

## 2022-06-12 NOTE — Progress Notes (Signed)
MRN : [id number]  Beth Mcguire is a 81 y.o. ([birth year]) female who presents with chief complaint of  Chief Complaint  Patient presents with   Follow-up    UT follow up  .  History of Present Illness: Patient returns today in follow up of her PAD.  She has some stitches from her recent debridement from the podiatrist but by report this is healing well.  Her wound is dressed today and I did not take her dressing down.  She is not having a lot of pain.  Her ABIs today are 1.04 on the right with biphasic waveforms and 1.07 on the left with monophasic waveforms.  His ABIs may be falsely elevated due to medial calcification, but they are stable and her perfusion appears to be reasonably good at this time.  Current Outpatient Medications  Medication Sig Dispense Refill   aspirin 81 MG tablet Take 1 tablet (81 mg total) by mouth daily.     atorvastatin (LIPITOR) 10 MG tablet Take 10 mg every evening by mouth.      B-D ULTRAFINE III SHORT PEN 31G X 8 MM MISC   0   Blood Glucose Monitoring Suppl (ONE TOUCH ULTRA MINI) w/Device KIT      clopidogrel (PLAVIX) 75 MG tablet TAKE 1 TABLET BY MOUTH EVERY DAY 90 tablet 1   glucose monitoring kit (FREESTYLE) monitoring kit 1 each by Does not apply route 4 (four) times daily -  before meals and at bedtime. 1 each 0   HYDROcodone-acetaminophen (NORCO/VICODIN) 5-325 MG tablet Take 1 tablet by mouth every 4 (four) hours as needed for moderate pain. 30 tablet 0   lisinopril (PRINIVIL,ZESTRIL) 20 MG tablet Take 20 mg by mouth daily.     metFORMIN (GLUCOPHAGE-XR) 500 MG 24 hr tablet Take 500 mg by mouth daily with supper.  0   NOVOLOG MIX 70/30 FLEXPEN (70-30) 100 UNIT/ML FlexPen Inject 25 Units into the skin 2 (two) times daily after a meal.     ONE TOUCH ULTRA TEST test strip      ONETOUCH DELICA LANCETS 75T MISC   0   QUEtiapine (SEROQUEL) 50 MG tablet Take 50 mg by mouth at bedtime.     No current facility-administered medications for this visit.     Past Medical History:  Diagnosis Date   Anxiety    Colon cancer ([place])    Dementia ([place]) 2022   Diabetes ([place])    Hypertension    Peripheral vascular disease ([place])     Past Surgical History:  Procedure Laterality Date   AMPUTATION Right 01/14/2016   Procedure: AMPUTATION RAY;  Surgeon: Albertine Patricia, DPM;  Location: [facility];  Service: Podiatry;  Laterality: Right;   AMPUTATION Left 11/26/2021   Procedure: AMPUTATION FIRST RAY;  Surgeon: Sharlotte Alamo, DPM;  Location: [facility];  Service: Podiatry;  Laterality: Left;   AMPUTATION TOE Left 02/11/2022   Procedure: Left Second Toe Amputation;  Surgeon: Samara Deist, DPM;  Location: [facility];  Service: Podiatry;  Laterality: Left;   AMPUTATION TOE Left 05/30/2022   Procedure: AMPUTATION TOE , LEFT THIRD TOE;  Surgeon: Sharlotte Alamo, DPM;  Location: [facility];  Service: Podiatry;  Laterality: Left;   COLON SURGERY     LOWER EXTREMITY ANGIOGRAPHY Left 08/14/2017   Procedure: Lower Extremity Angiography;  Surgeon: Algernon Huxley, MD;  Location: [facility];  Service: Cardiovascular;  Laterality: Left;   LOWER EXTREMITY ANGIOGRAPHY Left 11/06/2021   Procedure: LOWER EXTREMITY ANGIOGRAPHY;  Surgeon: Algernon Huxley, MD;  Location: [facility];  Service: Cardiovascular;  Laterality: Left;   PERIPHERAL VASCULAR CATHETERIZATION N/A 01/05/2016   Procedure: Lower Extremity Angiography;  Surgeon: Algernon Huxley, MD;  Location: [facility];  Service: Cardiovascular;  Laterality: N/A;   PERIPHERAL VASCULAR CATHETERIZATION  01/05/2016   Procedure: Lower Extremity Intervention;  Surgeon: Algernon Huxley, MD;  Location: [facility];  Service: Cardiovascular;;   PERIPHERAL VASCULAR CATHETERIZATION Right 06/25/2016   Procedure: Lower Extremity Angiography;  Surgeon: Algernon Huxley, MD;  Location: [facility];  Service: Cardiovascular;  Laterality: Right;   PERIPHERAL VASCULAR CATHETERIZATION  06/25/2016   Procedure: Lower Extremity  Intervention;  Surgeon: Algernon Huxley, MD;  Location: [facility] [facility];  Service: Cardiovascular;;   TONSILLECTOMY       Social History   Tobacco Use   Smoking status: Never   Smokeless tobacco: Never  Substance Use Topics   Alcohol use: No    Alcohol/week: 0.0 standard drinks of alcohol   Drug use: No      Family History  Problem Relation Age of Onset   Diabetes Other   No bleeding or clotting disorders No aneurysms   Allergies  Allergen Reactions   Amoxicillin-Pot Clavulanate Rash   Zosyn [Piperacillin Sod-Tazobactam So] Rash    REVIEW OF SYSTEMS (Negative unless checked)   Constitutional: _0 Weight loss  _1 Fever  _2 Chills Cardiac: _3 Chest pain   _4 Chest pressure   _5 Palpitations   _6 Shortness of breath when laying flat   _7 Shortness of breath at rest   _8 Shortness of breath with exertion. Vascular:  _9 Pain in legs with walking   _10 Pain in legs at rest   _11 Pain in legs when laying flat   _12 Claudication   _13 Pain in feet when walking  _14 Pain in feet at rest  _15 Pain in feet when laying flat   _16 History of DVT   _17 Phlebitis   _18 Swelling in legs   _19 Varicose veins   _20 Non-healing ulcers Pulmonary:   _21 Uses home oxygen   _22 Productive cough   _23 Hemoptysis   _24 Wheeze  _25 COPD   _26 Asthma Neurologic:  _27 Dizziness  _28 Blackouts   _29 Seizures   _30 History of stroke   _31 History of TIA  _32 Aphasia   _33 Temporary blindness   _34 Dysphagia   _35 Weakness or numbness in arms   _36 Weakness or numbness in legs Musculoskeletal:  _37 Arthritis   _38 Joint swelling   _39 Joint pain   _40 Low back pain Hematologic:  _41 Easy bruising  _42 Easy bleeding   _43 Hypercoagulable state   _44 Anemic   Gastrointestinal:  _45 Blood in stool   _46 Vomiting blood  _47 Gastroesophageal reflux/heartburn   _48 Abdominal pain Genitourinary:  _49 Chronic kidney disease   _50 Difficult urination  _51 Frequent urination  _52 Burning with urination   _53 Hematuria Skin:  _54 Rashes   _55 Ulcers   _56 Wounds Psychological:  _57 History of anxiety   _58   History of major depression.   Physical Examination  BP (!) 161/76   Pulse 73  Gen:  WD/WN, NAD debilitated appearing. Head: /AT, No temporalis wasting. Ear/Nose/Throat: Hearing grossly intact, nares w/o erythema or drainage Eyes: Conjunctiva clear. Sclera non-icteric Neck: Supple.  Trachea midline Pulmonary:  Good air movement, no use of accessory muscles.  Cardiac: Irregular Vascular:  Vessel Right Left  Radial Palpable Palpable                          PT 1+ palpable 1+ palpable  DP 1+ palpable Trace palpable   Musculoskeletal: M/S  5/5 throughout.  No deformity or atrophy.  Left foot wound is currently dressed.  Mild lower extremity edema.  In a wheelchair Neurologic: Sensation grossly intact in extremities.  Symmetrical.  Speech is fluent.  Psychiatric: Judgment and insight appear fair at best Dermatologic: Left foot wound is currently dressed      Labs Recent Results (from the past 2160 hour(s))  Hemoglobin A1c     Status: Abnormal   Collection Time: 05/29/22  9:46 PM  Result Value Ref Range   Hgb A1c MFr Bld 7.4 (H) 4.8 - 5.6 %    Comment: (NOTE) Pre diabetes:          5.7%-6.4%  Diabetes:              >6.4%  Glycemic control for   <7.0% adults with diabetes    Mean Plasma Glucose 165.68 mg/dL    Comment: Performed at [facility] [address], [place], [place] [address]  Sedimentation rate     Status: None   Collection Time: 05/29/22  9:46 PM  Result Value Ref Range   Sed Rate 27 0 - 30 mm/hr    Comment: Performed at [facility], [address] [place], [place] [address]  C-reactive protein     Status: None   Collection Time: 05/29/22  9:46 PM  Result Value Ref Range   CRP 0.6 <1.0 mg/dL    Comment: Performed at [facility], [place] [address], [place], [place] [address]  Prealbumin     Status: Abnormal   Collection Time: 05/29/22  9:46 PM  Result Value Ref Range   Prealbumin 13 (L) 18 - 38 mg/dL    Comment: Performed  at [facility] [address], [place], [place] [address]  Comprehensive metabolic panel     Status: Abnormal   Collection Time: 05/29/22  9:46 PM  Result Value Ref Range   Sodium 139 135 - 145 mmol/L   Potassium 4.6 3.5 - 5.1 mmol/L   Chloride 111 98 - 111 mmol/L   CO2 23 22 - 32 mmol/L   Glucose, Bld 147 (H) 70 - 99 mg/dL    Comment: Glucose reference range applies only to samples taken after fasting for at least 8 hours.   BUN 38 (H) 8 - 23 mg/dL   Creatinine, Ser 1.31 (H) 0.44 - 1.00 mg/dL   Calcium 9.1 8.9 - 10.3 mg/dL   Total Protein 6.1 (L) 6.5 - 8.1 g/dL   Albumin 3.1 (L) 3.5 - 5.0 g/dL   AST 13 (L) 15 - 41 U/L   ALT 8 0 - 44 U/L   Alkaline Phosphatase 131 (H) 38 - 126 U/L   Total Bilirubin 0.5 0.3 - 1.2 mg/dL   GFR, Estimated 40 (L) >60 mL/min    Comment: (NOTE) Calculated using the CKD-EPI Creatinine Equation (2021)    Anion gap 5 5 - 15    Comment: Performed at [facility], [address] [place], [place] [address]  CBC with Differential/Platelet     Status: Abnormal   Collection Time: 05/29/22  9:46 PM  Result Value Ref Range   WBC 9.5 4.0 - 10.5 K/uL   RBC 3.67 (L) 3.87 - 5.11 MIL/uL   Hemoglobin 10.4 (L) 12.0 - 15.0 g/dL   HCT 32.4 (L) 36.0 - 46.0 %   MCV 88.3 80.0 - 100.0 fL   MCH 28.3 26.0 - 34.0 pg   MCHC 32.1 30.0 - 36.0 g/dL  RDW 15.4 11.5 - 15.5 %   Platelets 355 150 - 400 K/uL   nRBC 0.0 0.0 - 0.2 %   Neutrophils Relative % 61 %   Neutro Abs 5.8 1.7 - 7.7 K/uL   Lymphocytes Relative 31 %   Lymphs Abs 2.9 0.7 - 4.0 K/uL   Monocytes Relative 5 %   Monocytes Absolute 0.5 0.1 - 1.0 K/uL   Eosinophils Relative 2 %   Eosinophils Absolute 0.2 0.0 - 0.5 K/uL   Basophils Relative 1 %   Basophils Absolute 0.1 0.0 - 0.1 K/uL   Immature Granulocytes 0 %   Abs Immature Granulocytes 0.03 0.00 - 0.07 K/uL    Comment: Performed at [facility], [address] [place], [place] [address]  Glucose, capillary     Status: Abnormal    Collection Time: 05/29/22 10:04 PM  Result Value Ref Range   Glucose-Capillary 132 (H) 70 - 99 mg/dL    Comment: Glucose reference range applies only to samples taken after fasting for at least 8 hours.  CBC     Status: Abnormal   Collection Time: 05/30/22  5:29 AM  Result Value Ref Range   WBC 11.3 (H) 4.0 - 10.5 K/uL   RBC 3.67 (L) 3.87 - 5.11 MIL/uL   Hemoglobin 10.4 (L) 12.0 - 15.0 g/dL   HCT 32.5 (L) 36.0 - 46.0 %   MCV 88.6 80.0 - 100.0 fL   MCH 28.3 26.0 - 34.0 pg   MCHC 32.0 30.0 - 36.0 g/dL   RDW 15.4 11.5 - 15.5 %   Platelets 362 150 - 400 K/uL   nRBC 0.0 0.0 - 0.2 %    Comment: Performed at [facility], [address] [place], [place] [address]  Protime-INR     Status: None   Collection Time: 05/30/22  5:29 AM  Result Value Ref Range   Prothrombin Time 14.1 11.4 - 15.2 seconds   INR 1.1 0.8 - 1.2    Comment: (NOTE) INR goal varies based on device and disease states. Performed at [facility], [address] [place], [place] [address]   Glucose, capillary     Status: Abnormal   Collection Time: 05/30/22  8:06 AM  Result Value Ref Range   Glucose-Capillary 43 (LL) 70 - 99 mg/dL    Comment: Glucose reference range applies only to samples taken after fasting for at least 8 hours.  Glucose, capillary     Status: Abnormal   Collection Time: 05/30/22  8:35 AM  Result Value Ref Range   Glucose-Capillary 47 (L) 70 - 99 mg/dL    Comment: Glucose reference range applies only to samples taken after fasting for at least 8 hours.  Glucose, capillary     Status: Abnormal   Collection Time: 05/30/22  8:55 AM  Result Value Ref Range   Glucose-Capillary 52 (L) 70 - 99 mg/dL    Comment: Glucose reference range applies only to samples taken after fasting for at least 8 hours.  Glucose, capillary     Status: Abnormal   Collection Time: 05/30/22  9:25 AM  Result Value Ref Range   Glucose-Capillary 62 (L) 70 - 99 mg/dL    Comment: Glucose reference range  applies only to samples taken after fasting for at least 8 hours.  Glucose, capillary     Status: Abnormal   Collection Time: 05/30/22 10:04 AM  Result Value Ref Range   Glucose-Capillary 149 (H) 70 - 99 mg/dL    Comment:  Glucose reference range applies only to samples taken after fasting for at least 8 hours.  Glucose, capillary     Status: Abnormal   Collection Time: 05/30/22 11:36 AM  Result Value Ref Range   Glucose-Capillary 164 (H) 70 - 99 mg/dL    Comment: Glucose reference range applies only to samples taken after fasting for at least 8 hours.  Glucose, capillary     Status: Abnormal   Collection Time: 05/30/22  4:15 PM  Result Value Ref Range   Glucose-Capillary 117 (H) 70 - 99 mg/dL    Comment: Glucose reference range applies only to samples taken after fasting for at least 8 hours.  Glucose, capillary     Status: Abnormal   Collection Time: 05/30/22  6:01 PM  Result Value Ref Range   Glucose-Capillary 104 (H) 70 - 99 mg/dL    Comment: Glucose reference range applies only to samples taken after fasting for at least 8 hours.  Surgical pathology     Status: None   Collection Time: 05/30/22  6:32 PM  Result Value Ref Range   SURGICAL PATHOLOGY      SURGICAL PATHOLOGY CASE: [id number] PATIENT: [facility] Surgical Pathology Report     Specimen Submitted: A. Toe, left third  Clinical History: Osteomyelitis left foot      DIAGNOSIS: A.  TOE, LEFT THIRD; AMPUTATION: - SKIN AND SOFT TISSUE WITH CHRONIC ULCERATION, NECROSIS, AND UNDERLYING OSTEOMYELITIS. - PROXIMAL RESECTION MARGIN WITH VIABLE SOFT TISSUE AND ARTICULAR SURFACE OF BONE; NEGATIVE FOR OSTEOMYELITIS.  GROSS DESCRIPTION: A. Labeled: Left third toe Received: Fresh Collection time: 6:32 PM on 05/30/2022 Placed into formalin time: 6:53 PM on 05/30/2022 Size: 3.5 x 2.3 x 1.8 cm and 2.9 x 1.6 x 1.4 cm Description of lesion(s): Received is a portion of distal toe with a potential nailbed.  The distal  skin is tan-red and softened with a 1.1 x 0.2 cm linear defect at the distalmost aspect.  The scant amount of remaining skin is white and finely wrinkled.  Additionally received is a portion of bone with 2 disarticulated aspects.  The smaller disa rticulated aspect is presumed to abut the disarticulated aspect on the toe. Proximal margin: The proximal margin is inked blue and displays white wrinkled skin with scattered areas of red softened skin, tan soft tissue, and tan firm disarticulated bone.  The larger disarticulated aspect on the additional fragment of bone is presumed to be abutting the true resection margin. Bone: The bone underlying the skin discoloration is diffusely softened and fragmented.  Additionally, the disarticulated aspect of bone presumed to abut the toe is fragmented and slightly softened. Other findings: None grossly appreciated.  Block summary: 1 - disarticulated bone at presumed resection margin, perpendicularly sectioned and submitted entirely 2 - skin discoloration perpendicularly sectioned to closest skin and soft tissue resection margins 3 - 4 - representative skin discoloration with underlying softened and fragmented bone (distal and proximal, respectively) 5 - representative frag mented disarticulated bone  Tissue decalcification: Cassettes 1, 3, 4, and 5  RB 05/31/2022  Final Diagnosis performed by Quay Burow, MD.   Electronically signed 06/01/2022 1:48:41PM The electronic signature indicates that the named Attending Pathologist has evaluated the specimen Technical component performed at [facility], [address], [place], [place] [address] Lab: [phone] Dir: Rush Farmer, MD, MMM  Professional component performed at [facility], [place], [place], [place] [address] Lab: [phone] Dir: Kathi Simpers, MD   Aerobic/Anaerobic Culture w Gram Stain (surgical/deep wound)  Status: None   Collection Time:  05/30/22  6:40 PM   Specimen: PATH Digit amputation; Tissue  Result Value Ref Range   Specimen Description      TOE Performed at [facility], [address], [place], [place] [address]    Special Requests      NONE Performed at [facility], [place], [place] [address]    Gram Stain NO ORGANISMS SEEN NO WBC SEEN     Culture      No growth aerobically or anaerobically. Performed at [facility], [place] [address] [place], [place] [address]    Report Status 06/05/2022 FINAL   Glucose, capillary     Status: None   Collection Time: 05/30/22  6:58 PM  Result Value Ref Range   Glucose-Capillary 97 70 - 99 mg/dL    Comment: Glucose reference range applies only to samples taken after fasting for at least 8 hours.  Glucose, capillary     Status: Abnormal   Collection Time: 05/30/22  8:24 PM  Result Value Ref Range   Glucose-Capillary 123 (H) 70 - 99 mg/dL    Comment: Glucose reference range applies only to samples taken after fasting for at least 8 hours.  Glucose, capillary     Status: Abnormal   Collection Time: 05/31/22  7:35 AM  Result Value Ref Range   Glucose-Capillary 110 (H) 70 - 99 mg/dL    Comment: Glucose reference range applies only to samples taken after fasting for at least 8 hours.  CBC     Status: Abnormal   Collection Time: 05/31/22 10:07 AM  Result Value Ref Range   WBC 13.4 (H) 4.0 - 10.5 K/uL   RBC 3.59 (L) 3.87 - 5.11 MIL/uL   Hemoglobin 10.4 (L) 12.0 - 15.0 g/dL   HCT 32.5 (L) 36.0 - 46.0 %   MCV 90.5 80.0 - 100.0 fL   MCH 29.0 26.0 - 34.0 pg   MCHC 32.0 30.0 - 36.0 g/dL   RDW 15.6 (H) 11.5 - 15.5 %   Platelets 355 150 - 400 K/uL   nRBC 0.0 0.0 - 0.2 %    Comment: Performed at [facility], [address], [place], [place] [address]  Basic metabolic panel     Status: Abnormal   Collection Time: 05/31/22 10:07 AM  Result Value Ref Range   Sodium 141 135 - 145 mmol/L   Potassium 4.5 3.5 - 5.1 mmol/L    Chloride 115 (H) 98 - 111 mmol/L   CO2 20 (L) 22 - 32 mmol/L   Glucose, Bld 168 (H) 70 - 99 mg/dL    Comment: Glucose reference range applies only to samples taken after fasting for at least 8 hours.   BUN 28 (H) 8 - 23 mg/dL   Creatinine, Ser 1.24 (H) 0.44 - 1.00 mg/dL   Calcium 8.6 (L) 8.9 - 10.3 mg/dL   GFR, Estimated 43 (L) >60 mL/min    Comment: (NOTE) Calculated using the CKD-EPI Creatinine Equation (2021)    Anion gap 6 5 - 15    Comment: Performed at [facility], [address] [place],  [address]  Glucose, capillary     Status: Abnormal   Collection Time: 05/31/22 11:35 AM  Result Value Ref Range   Glucose-Capillary 193 (H) 70 - 99 mg/dL    Comment: Glucose reference range applies only to samples taken after fasting for at least 8 hours.    Radiology US ARTERIAL ABI (  SCREENING LOWER EXTREMITY)  Result Date: 05/30/2022 CLINICAL DATA:  History of peripheral arterial disease and toe wounds EXAM: NONINVASIVE PHYSIOLOGIC VASCULAR STUDY OF BILATERAL LOWER EXTREMITIES TECHNIQUE: Evaluation of both lower extremities were performed at rest, including calculation of ankle-brachial indices with single level Doppler, pressure and pulse volume recording. COMPARISON:  Prior ankle-brachial indices 02/10/2022 FINDINGS: Right ABI:  0.59 (previously 0.57) Left ABI:  0.70 (previously 0.66) Right Lower Extremity: Abnormal monophasic arterial waveforms at the ankle. Left Lower Extremity: Abnormal monophasic arterial waveforms at the ankle. 0.5-0.79 Moderate PAD IMPRESSION: 1. Abnormal bilateral resting ankle-brachial indices consistent with at least moderate underlying peripheral arterial disease worse on the right than the left. 2. No significant interval change compared to 02/10/2022. Signed, Criselda Peaches, MD, [facility] Radiology Electronically Signed   By: Jacqulynn Cadet M.D.   On: 05/30/2022 16:31   DG Foot 2 Views  Left  Result Date: 05/29/2022 CLINICAL DATA:  Diabetic foot infection. EXAM: LEFT FOOT - 2 VIEW COMPARISON:  Radiographs 02/09/2022 FINDINGS: Heterogeneously decreased density involving the third toe distal tuft suspicious for osteomyelitis. There is been prior transmetatarsal amputation of the first ray and resection of the second toe at the proximal phalanx. There is section margins are smooth. There is marked soft tissue thickening and edema overlying the dorsum of the forefoot. No definite soft tissue gas. IMPRESSION: 1. Findings suspicious for osteomyelitis of the third toe distal tuft. 2. Marked soft tissue thickening and edema overlying the dorsum of the forefoot. No definite soft tissue gas. 3. Prior transmetatarsal amputation of the first ray and resection of the second toe at the proximal phalanx. Electronically Signed   By: Keith Rake M.D.   On: 05/29/2022 22:38    Assessment/Plan Hypertension blood pressure control important in reducing the progression of atherosclerotic disease. On appropriate oral medications.     Diabetes ([place]) blood glucose control important in reducing the progression of atherosclerotic disease. Also, involved in wound healing. On appropriate medications.     Hyperlipidemia lipid control important in reducing the progression of atherosclerotic disease. Continue statin therapy  Atherosclerosis of native arteries of the extremities with ulceration (HCC) Her ABIs today are 1.04 on the right with biphasic waveforms and 1.07 on the left with monophasic waveforms.  His ABIs may be falsely elevated due to medial calcification, but they are stable and her perfusion appears to be reasonably good at this time.  No role for intervention at this time.  We will plan a short follow-up of 3 to 4 months since she has continued wound issues.    Leotis Pain, MD  06/12/2022 3:31 PM    This note was created with Dragon medical transcription system.  Any errors from  dictation are purely unintentional

## 2022-07-12 ENCOUNTER — Emergency Department: Payer: Medicare HMO

## 2022-07-12 ENCOUNTER — Inpatient Hospital Stay
Admission: EM | Admit: 2022-07-12 | Discharge: 2022-07-14 | DRG: 389 | Disposition: A | Discharge status: Home / Self Care | Payer: Medicare HMO | Attending: Internal Medicine | Admitting: Internal Medicine

## 2022-07-12 ENCOUNTER — Other Ambulatory Visit: Payer: Self-pay

## 2022-07-12 DIAGNOSIS — Z7984 Long term (current) use of oral hypoglycemic drugs: Secondary | ICD-10-CM

## 2022-07-12 DIAGNOSIS — E1169 Type 2 diabetes mellitus with other specified complication: Secondary | ICD-10-CM | POA: Diagnosis present

## 2022-07-12 DIAGNOSIS — Z794 Long term (current) use of insulin: Secondary | ICD-10-CM

## 2022-07-12 DIAGNOSIS — Z79899 Other long term (current) drug therapy: Secondary | ICD-10-CM | POA: Diagnosis not present

## 2022-07-12 DIAGNOSIS — Z7902 Long term (current) use of antithrombotics/antiplatelets: Secondary | ICD-10-CM | POA: Diagnosis not present

## 2022-07-12 DIAGNOSIS — K5641 Fecal impaction: Secondary | ICD-10-CM | POA: Diagnosis present

## 2022-07-12 DIAGNOSIS — E1122 Type 2 diabetes mellitus with diabetic chronic kidney disease: Secondary | ICD-10-CM | POA: Diagnosis present

## 2022-07-12 DIAGNOSIS — Z66 Do not resuscitate: Secondary | ICD-10-CM | POA: Diagnosis present

## 2022-07-12 DIAGNOSIS — E1151 Type 2 diabetes mellitus with diabetic peripheral angiopathy without gangrene: Secondary | ICD-10-CM | POA: Diagnosis present

## 2022-07-12 DIAGNOSIS — D75839 Thrombocytosis, unspecified: Secondary | ICD-10-CM | POA: Diagnosis present

## 2022-07-12 DIAGNOSIS — I129 Hypertensive chronic kidney disease with stage 1 through stage 4 chronic kidney disease, or unspecified chronic kidney disease: Secondary | ICD-10-CM | POA: Diagnosis present

## 2022-07-12 DIAGNOSIS — F03918 Unspecified dementia, unspecified severity, with other behavioral disturbance: Secondary | ICD-10-CM | POA: Diagnosis present

## 2022-07-12 DIAGNOSIS — M869 Osteomyelitis, unspecified: Secondary | ICD-10-CM | POA: Diagnosis present

## 2022-07-12 DIAGNOSIS — E1165 Type 2 diabetes mellitus with hyperglycemia: Secondary | ICD-10-CM | POA: Diagnosis present

## 2022-07-12 DIAGNOSIS — L8990 Pressure ulcer of unspecified site, unspecified stage: Secondary | ICD-10-CM | POA: Insufficient documentation

## 2022-07-12 DIAGNOSIS — N1831 Chronic kidney disease, stage 3a: Secondary | ICD-10-CM | POA: Diagnosis present

## 2022-07-12 DIAGNOSIS — R1030 Lower abdominal pain, unspecified: Principal | ICD-10-CM

## 2022-07-12 DIAGNOSIS — R32 Unspecified urinary incontinence: Secondary | ICD-10-CM | POA: Diagnosis present

## 2022-07-12 DIAGNOSIS — E785 Hyperlipidemia, unspecified: Secondary | ICD-10-CM | POA: Diagnosis present

## 2022-07-12 DIAGNOSIS — Z833 Family history of diabetes mellitus: Secondary | ICD-10-CM

## 2022-07-12 DIAGNOSIS — L89151 Pressure ulcer of sacral region, stage 1: Secondary | ICD-10-CM | POA: Diagnosis present

## 2022-07-12 DIAGNOSIS — Z85038 Personal history of other malignant neoplasm of large intestine: Secondary | ICD-10-CM

## 2022-07-12 DIAGNOSIS — D649 Anemia, unspecified: Secondary | ICD-10-CM | POA: Diagnosis present

## 2022-07-12 DIAGNOSIS — R339 Retention of urine, unspecified: Secondary | ICD-10-CM

## 2022-07-12 DIAGNOSIS — R338 Other retention of urine: Secondary | ICD-10-CM | POA: Diagnosis not present

## 2022-07-12 DIAGNOSIS — N390 Urinary tract infection, site not specified: Secondary | ICD-10-CM | POA: Diagnosis present

## 2022-07-12 DIAGNOSIS — I10 Essential (primary) hypertension: Secondary | ICD-10-CM | POA: Diagnosis not present

## 2022-07-12 DIAGNOSIS — Z7982 Long term (current) use of aspirin: Secondary | ICD-10-CM | POA: Diagnosis not present

## 2022-07-12 DIAGNOSIS — R748 Abnormal levels of other serum enzymes: Secondary | ICD-10-CM | POA: Diagnosis not present

## 2022-07-12 DIAGNOSIS — K5909 Other constipation: Secondary | ICD-10-CM | POA: Diagnosis not present

## 2022-07-12 DIAGNOSIS — N183 Chronic kidney disease, stage 3 unspecified: Secondary | ICD-10-CM | POA: Diagnosis present

## 2022-07-12 LAB — CBC WITH DIFFERENTIAL/PLATELET
Abs Immature Granulocytes: 0.09 10*3/uL — ABNORMAL HIGH (ref 0.00–0.07)
Basophils Absolute: 0.1 10*3/uL (ref 0.0–0.1)
Basophils Relative: 0 %
Eosinophils Absolute: 0.1 10*3/uL (ref 0.0–0.5)
Eosinophils Relative: 0 %
HCT: 32.1 % — ABNORMAL LOW (ref 36.0–46.0)
Hemoglobin: 10.3 g/dL — ABNORMAL LOW (ref 12.0–15.0)
Immature Granulocytes: 1 %
Lymphocytes Relative: 9 %
Lymphs Abs: 1.5 10*3/uL (ref 0.7–4.0)
MCH: 29.6 pg (ref 26.0–34.0)
MCHC: 32.1 g/dL (ref 30.0–36.0)
MCV: 92.2 fL (ref 80.0–100.0)
Monocytes Absolute: 0.6 10*3/uL (ref 0.1–1.0)
Monocytes Relative: 4 %
Neutro Abs: 14.1 10*3/uL — ABNORMAL HIGH (ref 1.7–7.7)
Neutrophils Relative %: 86 %
Platelets: 419 10*3/uL — ABNORMAL HIGH (ref 150–400)
RBC: 3.48 MIL/uL — ABNORMAL LOW (ref 3.87–5.11)
RDW: 14.6 % (ref 11.5–15.5)
WBC: 16.4 10*3/uL — ABNORMAL HIGH (ref 4.0–10.5)
nRBC: 0 % (ref 0.0–0.2)

## 2022-07-12 LAB — URINALYSIS, ROUTINE W REFLEX MICROSCOPIC
Bacteria, UA: NONE SEEN
Bilirubin Urine: NEGATIVE
Glucose, UA: 50 mg/dL — AB
Hgb urine dipstick: NEGATIVE
Ketones, ur: NEGATIVE mg/dL
Nitrite: NEGATIVE
Protein, ur: NEGATIVE mg/dL
Specific Gravity, Urine: 1.016 (ref 1.005–1.030)
WBC, UA: >50 WBC/hpf — ABNORMAL HIGH (ref 0–5)
pH: 8 (ref 5.0–8.0)

## 2022-07-12 LAB — COMPREHENSIVE METABOLIC PANEL
ALT: 10 U/L (ref 0–44)
AST: 18 U/L (ref 15–41)
Albumin: 3.1 g/dL — ABNORMAL LOW (ref 3.5–5.0)
Alkaline Phosphatase: 150 U/L — ABNORMAL HIGH (ref 38–126)
Anion gap: 8 (ref 5–15)
BUN: 22 mg/dL (ref 8–23)
CO2: 21 mmol/L — ABNORMAL LOW (ref 22–32)
Calcium: 8.9 mg/dL (ref 8.9–10.3)
Chloride: 107 mmol/L (ref 98–111)
Creatinine, Ser: 1.01 mg/dL — ABNORMAL HIGH (ref 0.44–1.00)
GFR, Estimated: 55 mL/min — ABNORMAL LOW (ref >60)
Glucose, Bld: 282 mg/dL — ABNORMAL HIGH (ref 70–99)
Potassium: 4.9 mmol/L (ref 3.5–5.1)
Sodium: 136 mmol/L (ref 135–145)
Total Bilirubin: 0.5 mg/dL (ref 0.3–1.2)
Total Protein: 6.6 g/dL (ref 6.5–8.1)

## 2022-07-12 LAB — LIPASE, BLOOD: Lipase: 21 U/L (ref 11–51)

## 2022-07-12 LAB — TROPONIN I (HIGH SENSITIVITY): Troponin I (High Sensitivity): 6 ng/L (ref <18)

## 2022-07-12 MED ORDER — SODIUM CHLORIDE 0.9 % IV SOLN: 1.0000 g | INTRAVENOUS | Status: DC

## 2022-07-12 MED ORDER — ACETAMINOPHEN 325 MG PO TABS
650.0000 mg | ORAL_TABLET | Freq: Four times a day (QID) | ORAL | Status: DC | PRN
  Administered 2022-07-13: 650 mg via ORAL
  Filled 2022-07-12: qty 2

## 2022-07-12 MED ORDER — ONDANSETRON HCL 4 MG PO TABS: 4.0000 mg | ORAL_TABLET | Freq: Four times a day (QID) | ORAL | Status: DC | PRN

## 2022-07-12 MED ORDER — ONDANSETRON HCL 4 MG/2ML IJ SOLN: 4.0000 mg | Freq: Four times a day (QID) | INTRAMUSCULAR | Status: DC | PRN

## 2022-07-12 MED ORDER — POLYETHYLENE GLYCOL 3350 17 G PO PACK
17.0000 g | PACK | Freq: Two times a day (BID) | ORAL | Status: DC
  Administered 2022-07-13 – 2022-07-14 (×3): 17 g via ORAL
  Filled 2022-07-12 (×3): qty 1

## 2022-07-12 MED ORDER — SODIUM CHLORIDE 0.9 % IV SOLN
1.0000 g | Freq: Once | INTRAVENOUS | Status: AC
  Administered 2022-07-12: 1 g via INTRAVENOUS
  Filled 2022-07-12: qty 10

## 2022-07-12 MED ORDER — MAGNESIUM CITRATE PO SOLN
1.0000 | Freq: Once | ORAL | Status: AC
  Administered 2022-07-12: 1 via ORAL
  Filled 2022-07-12: qty 296

## 2022-07-12 MED ORDER — IOHEXOL 300 MG/ML  SOLN
100.0000 mL | Freq: Once | INTRAMUSCULAR | Status: AC | PRN
  Administered 2022-07-12: 100 mL via INTRAVENOUS

## 2022-07-12 MED ORDER — SODIUM CHLORIDE 0.9 % IV SOLN
1.0000 g | INTRAVENOUS | Status: DC
  Administered 2022-07-13: 1 g via INTRAVENOUS
  Filled 2022-07-12 (×2): qty 10

## 2022-07-12 MED ORDER — INSULIN ASPART 100 UNIT/ML IJ SOLN
0.0000 [IU] | Freq: Every day | INTRAMUSCULAR | Status: DC
  Administered 2022-07-13: 3 [IU] via SUBCUTANEOUS
  Filled 2022-07-12: qty 1

## 2022-07-12 MED ORDER — INSULIN ASPART 100 UNIT/ML IJ SOLN
0.0000 [IU] | Freq: Three times a day (TID) | INTRAMUSCULAR | Status: DC
  Administered 2022-07-13 (×2): 3 [IU] via SUBCUTANEOUS
  Administered 2022-07-14: 2 [IU] via SUBCUTANEOUS
  Filled 2022-07-12 (×3): qty 1

## 2022-07-12 MED ORDER — LACTATED RINGERS IV SOLN: INTRAVENOUS | Status: DC

## 2022-07-12 MED ORDER — ACETAMINOPHEN 650 MG RE SUPP: 650.0000 mg | Freq: Four times a day (QID) | RECTAL | Status: DC | PRN

## 2022-07-12 MED ORDER — ENOXAPARIN SODIUM 40 MG/0.4ML IJ SOSY
40.0000 mg | PREFILLED_SYRINGE | INTRAMUSCULAR | Status: DC
  Administered 2022-07-13 – 2022-07-14 (×2): 40 mg via SUBCUTANEOUS
  Filled 2022-07-12 (×2): qty 0.4

## 2022-07-12 MED ORDER — MAGNESIUM CITRATE PO SOLN
1.0000 | Freq: Once | ORAL | Status: DC
  Filled 2022-07-12: qty 296

## 2022-07-12 NOTE — ED Triage Notes (Signed)
Pt. To ED via POV with tailbone pain and abdominal pain today. Pt. States she had SOB today at lunch denies CP. Pt. States she has wound on her bottom x "several months" but is unable to say how long for sure. Pt. Denies n/v/d.

## 2022-07-12 NOTE — ED Notes (Signed)
PT placed on new purewick and instructed to urinate. PT attempting to pee.

## 2022-07-12 NOTE — ED Triage Notes (Signed)
Pt here via [facility] with difficulty breathing. Pt also hypertensive, no hx. Hx of DM, family told ems that they gave her 28 units of insulin,   Cbg-500 20 G L bicep 189/78 End tidal-25 100% RA 91

## 2022-07-12 NOTE — ED Notes (Signed)
Pt voided 134m in suction container at this time

## 2022-07-12 NOTE — ED Provider Notes (Signed)
[facility] Provider Note    Event Date/Time   First MD Initiated Contact with Patient 07/12/22 1601     (approximate)   History   Tailbone Pain (Pt. To ED via POV with tailbone pain and abdominal pain today. Pt. States she had SOB today at lunch denies CP. Pt. States she has wound on her bottom x "several months" but is unable to say how long for sure. Pt. Denies n/v/d.), Abdominal Pain, and Shortness of Breath   HPI  LATORIE MONTESANO is a 81 y.o. female who comes in complaining of pain in her tailbone and belly.  She apparently was short of breath briefly at lunch today but that has been gone.  She says she has a wound on her bottom as well.  Family reports she is doing fairly well has not been urinating a lot recently but has history of renal failure.      Physical Exam   Triage Vital Signs: ED Triage Vitals  Enc Vitals Group     BP 07/12/22 1337 (!) 184/63     Pulse Rate 07/12/22 1337 79     Resp 07/12/22 1337 20     Temp 07/12/22 1337 97.7 F (36.5 C)     Temp Source 07/12/22 1337 Oral     SpO2 07/12/22 1337 100 %     Weight --      Height --      Head Circumference --      Peak Flow --      Pain Score 07/12/22 1338 7     Pain Loc --      Pain Edu? --      Excl. in [place]? --     Most recent vital signs: Vitals:   07/12/22 2200 07/12/22 2230  BP: (!) 158/64 (!) 165/60  Pulse: 92 86  Resp: 17 19  Temp:    SpO2: 97% 100%     General: Awake, no distress.  CV:  Good peripheral perfusion.  Heart regular rate and rhythm no audible murmurs Resp:  Normal effort.  Lungs are clear Abd:  No distention.  There is some suprapubic tenderness and possibly fullness.  Otherwise nontender ext trace edema. Rectal area I do not see any skin defects.  The patient has a massive stool bulging first from the rectum.  I disimpacted this and got enough stool out to make a grapefruit size mass.  Softer stool was then coming down but had not gotten all the way  down yet.  ED Results / Procedures / Treatments   Labs (all labs ordered are listed, but only abnormal results are displayed) Labs Reviewed  CBC WITH DIFFERENTIAL/PLATELET - Abnormal; Notable for the following components:      Result Value   WBC 16.4 (*)    RBC 3.48 (*)    Hemoglobin 10.3 (*)    HCT 32.1 (*)    Platelets 419 (*)    Neutro Abs 14.1 (*)    Abs Immature Granulocytes 0.09 (*)    All other components within normal limits  COMPREHENSIVE METABOLIC PANEL - Abnormal; Notable for the following components:   CO2 21 (*)    Glucose, Bld 282 (*)    Creatinine, Ser 1.01 (*)    Albumin 3.1 (*)    Alkaline Phosphatase 150 (*)    GFR, Estimated 55 (*)    All other components within normal limits  URINALYSIS, ROUTINE W REFLEX MICROSCOPIC - Abnormal; Notable for the following components:  Color, Urine YELLOW (*)    APPearance HAZY (*)    Glucose, UA 50 (*)    Leukocytes,Ua LARGE (*)    WBC, UA >50 (*)    All other components within normal limits  URINE CULTURE  LIPASE, BLOOD  CBC  CREATININE, SERUM  CBC  COMPREHENSIVE METABOLIC PANEL  TROPONIN I (HIGH SENSITIVITY)  TROPONIN I (HIGH SENSITIVITY)     EKG EKG read interpreted by me shows normal sinus rhythm rate of 80 left axis no obvious acute ST-T wave changes are seen there is a lot of artifact present.    RADIOLOGY    PROCEDURES:  Critical Care performed:   Procedures   MEDICATIONS ORDERED IN ED: Medications  insulin aspart (novoLOG) injection 0-15 Units (has no administration in time range)  insulin aspart (novoLOG) injection 0-5 Units ( Subcutaneous Not Given 07/12/22 2334)  enoxaparin (LOVENOX) injection 40 mg (has no administration in time range)  lactated ringers infusion ( Intravenous New Bag/Given 07/12/22 2336)  acetaminophen (TYLENOL) tablet 650 mg (has no administration in time range)    Or  acetaminophen (TYLENOL) suppository 650 mg (has no administration in time range)  ondansetron (ZOFRAN)  tablet 4 mg (has no administration in time range)    Or  ondansetron (ZOFRAN) injection 4 mg (has no administration in time range)  cefTRIAXone (ROCEPHIN) 1 g in sodium chloride 0.9 % 100 mL IVPB (has no administration in time range)  magnesium citrate solution 1 Bottle (has no administration in time range)  polyethylene glycol (MIRALAX / GLYCOLAX) packet 17 g (has no administration in time range)  iohexol (OMNIPAQUE) 300 MG/ML solution 100 mL (100 mLs Intravenous Contrast Given 07/12/22 1626)  magnesium citrate solution 1 Bottle (1 Bottle Oral Given 07/12/22 1941)  cefTRIAXone (ROCEPHIN) 1 g in sodium chloride 0.9 % 100 mL IVPB (0 g Intravenous Stopped 07/12/22 1912)     IMPRESSION / MDM / ASSESSMENT AND PLAN / ED COURSE  I reviewed the triage vital signs and the nursing notes. ----------------------------------------- 6:59 PM on 07/12/2022 ----------------------------------------- Attempted to pass the catheter as the patient still had 380 cc left after voiding.  Nurse was unable to do so so I called Dr. Caprice Beaver but he feels that the patient is doing well enough he will follow her up in a clinic.  I have copied and pasted his texted note.CT looks good, no hydro, normal renal fxn. Would avoid foley with active voiding and reasonable PVRs. >50 wbcs and leukocytes on UA and recommend culture, I can set her up for clinic early next week to repeat PVR and confirm emptying appropriately Patient has not stooled even after giving her the mag citrate.  We will get her in the hospital overnight and try to get her completely unblocked from her stool.  Perhaps she will be able to urinate better.  Then she can follow-up with urology.  Differential diagnosis includes, but is not limited to, fecal impaction causing trouble urinating.  Trouble urinating caused by UTI or trouble urinating caused by partial obstruction from stricture.  Patient's presentation is most consistent with acute problem causing  discomfort and with the potential to cause life-threatening illness.  he patient is on the cardiac monitor to evaluate for evidence of arrhythmia and/or significant heart rate changes.  None have been seen.      FINAL CLINICAL IMPRESSION(S) / ED DIAGNOSES   Final diagnoses:  Lower abdominal pain  Urinary retention  Fecal impaction ([place])     Rx / DC Orders  ED Discharge Orders     None        Note:  This document was prepared using Dragon voice recognition software and may include unintentional dictation errors.   Nena Polio, MD 07/12/22 2352

## 2022-07-12 NOTE — ED Notes (Signed)
Large amount of stool manually disimpacted by MD Malinda with this RN at bedside. Pt purewick and brief replaced at this time.

## 2022-07-12 NOTE — ED Provider Triage Note (Signed)
Emergency Medicine Provider Triage Evaluation Note  Beth Mcguire , a 81 y.o. female  was evaluated in triage.  Pt complains of tailbone pain starting earlier today along with some difficulty breathing and nausea while attempting to eat something.  Review of Systems  Positive: Tailbone pain, shortness of breath, and nausea. Negative: Abdominal pain, chest pain, fever, cough, vomiting, diarrhea, dysuria.  Physical Exam  BP (!) 184/63 (BP Location: Right Arm)   Pulse 79   Temp 97.7 F (36.5 C) (Oral)   Resp 20   SpO2 100%  Gen:   Awake, no distress Resp:  Normal effort MSK:   Moves extremities without difficulty Other:  Abdomen soft and nontender.  Medical Decision Making  Medically screening exam initiated at 1:51 PM.  Appropriate orders placed.  Beth Mcguire was informed that the remainder of the evaluation will be completed by another provider, this initial triage assessment does not replace that evaluation, and the importance of remaining in the ED until their evaluation is complete.   Blake Divine, MD 07/12/22 1352

## 2022-07-12 NOTE — ED Notes (Signed)
Pt coming from HOME today [facility] for tailbone pain, foot pain and generalized feelings of un-wellness, pt daughter stating that at lunch time, the patient was eating when she suddenly endorsed that her food didn't taste right and she had pain in her tailbone. Pt daughter stating patient also stated she had pain in her feet. Pt arrived and was placed in stretcher with max assist. Pt brief was changed, and loose stool and small hardened amounts of stool in brief. Pt appeared to have stool still in rectum, but was unable to say whether or not she needed to pass more. Pt was changed, new brief placed and purewick was placed for patient desire to void. Daughter states patient has dementia and CKD and only makes urine sometimes, but when she pees, she pees a lot.

## 2022-07-12 NOTE — ED Notes (Signed)
Attempted to straight cath patient, unsuccessful dt resistance upon insertion. MD aware.Pure wick placed, pt resting in bed.

## 2022-07-12 NOTE — H&P (Signed)
History and Physical    Patient: Beth Mcguire:[id number] DOB: 1941/04/17 DOA: 07/12/2022 DOS: the patient was seen and examined on 07/12/2022 PCP: [name], MD  Patient coming from: Home  Chief Complaint:  Chief Complaint  Patient presents with   Tailbone Pain    Pt. To ED via POV with tailbone pain and abdominal pain today. Pt. States she had SOB today at lunch denies CP. Pt. States she has wound on her bottom x "several months" but is unable to say how long for sure. Pt. Denies n/v/d.   Abdominal Pain   Shortness of Breath   HPI: SANTASIA REW is a 81 y.o. female with medical history significant of colon cancer, dementia, diabetes, essential hypertension, peripheral vascular disease who presented with abdominal pain and pain in the tailbone.  Patient was admitted with shortness of breath at [facility].  Came to the ER where she was seen and evaluated.  Also has shortness of breath.  Patient was evaluated in the ER seems to have urinary retention.  Further evaluation including CT abdomen showed severe fecal impaction.  Patient was partially disimpacted in the ER.  Patient continues to have some mild shortness of breath.  In and out cath failed.  Urology consulted with recommendations for outpatient follow-up.  She has a residual about 300 after voiding.  Patient also has evidence of UTI.  Denied chest pain.  Denied any other complaints.  At this point patient is being admitted for further evaluation of urinary retention and fecal impaction.  Review of Systems: As mentioned in the history of present illness. All other systems reviewed and are negative. Past Medical History:  Diagnosis Date   Anxiety    Colon cancer ([place])    Dementia ([place]) 2022   Diabetes ([place])    Hypertension    Peripheral vascular disease ([place])    Past Surgical History:  Procedure Laterality Date   AMPUTATION Right 01/14/2016   Procedure: AMPUTATION RAY;  Surgeon: Albertine Patricia, DPM;  Location: [facility] ORS;   Service: Podiatry;  Laterality: Right;   AMPUTATION Left 11/26/2021   Procedure: AMPUTATION FIRST RAY;  Surgeon: Sharlotte Alamo, DPM;  Location: [facility] ORS;  Service: Podiatry;  Laterality: Left;   AMPUTATION TOE Left 02/11/2022   Procedure: Left Second Toe Amputation;  Surgeon: Samara Deist, DPM;  Location: [facility] ORS;  Service: Podiatry;  Laterality: Left;   AMPUTATION TOE Left 05/30/2022   Procedure: AMPUTATION TOE , LEFT THIRD TOE;  Surgeon: Sharlotte Alamo, DPM;  Location: [facility] OR[facility];  Service: Podiatry;  Laterality: Left;   COLON SURGERY     LOWER EXTREMITY ANGIOGRAPHY Left 08/14/2017   Procedure: Lower Extremity Angiography;  Surgeon: Algernon Huxley, MD;  Location: [place] [place] [facility];  Service: Cardiovascular;  Laterality: Left;   LOWER EXTREMITY ANGIOGRAPHY Left 11/06/2021   Procedure: LOWER EXTREMITY ANGIOGRAPHY;  Surgeon: Algernon Huxley, MD;  Location: [place] [facility];  Service: Cardiovascular;  Laterality: Left;   PERIPHERAL VASCULAR CATHETERIZATION N/A 01/05/2016   Procedure: Lower Extremity Angiography;  Surgeon: Algernon Huxley, MD;  Location: [place] [facility];  Service: Cardiovascular;  Laterality: N/A;   PERIPHERAL VASCULAR CATHETERIZATION  01/05/2016   Procedure: Lower Extremity Intervention;  Surgeon: Algernon Huxley, MD;  Location: [place] [facility];  Service: Cardiovascular;;   PERIPHERAL VASCULAR CATHETERIZATION Right 06/25/2016   Procedure: Lower Extremity Angiography;  Surgeon: Algernon Huxley, MD;  Location: [place] [facility];  Service: Cardiovascular;  Laterality: Right;   PERIPHERAL VASCULAR CATHETERIZATION  06/25/2016   Procedure: Lower Extremity Intervention;  Surgeon: Algernon Huxley, MD;  Location: [place] [facility];  Service: Cardiovascular;;   TONSILLECTOMY     Social History:  reports that she has never smoked. She has never used smokeless tobacco. She reports that she does not drink alcohol and does not use drugs.  Allergies  Allergen Reactions   Amoxicillin-Pot Clavulanate  Rash   Zosyn [Piperacillin Sod-Tazobactam So] Rash    Family History  Problem Relation Age of Onset   Diabetes Other     Prior to Admission medications   Medication Sig Start Date End Date Taking? Authorizing Provider  aspirin 81 MG tablet Take 1 tablet (81 mg total) by mouth daily. 01/06/16   Hillary Bow, MD  atorvastatin (LIPITOR) 10 MG tablet Take 10 mg every evening by mouth.  02/08/16   [provider]  clopidogrel (PLAVIX) 75 MG tablet TAKE 1 TABLET BY MOUTH EVERY DAY 09/22/20   Kris Hartmann, NP  glucose monitoring kit (FREESTYLE) monitoring kit 1 each by Does not apply route 4 (four) times daily -  before meals and at bedtime. 01/06/16   Hillary Bow, MD  HYDROcodone-acetaminophen (NORCO/VICODIN) 5-325 MG tablet Take 1 tablet by mouth every 4 (four) hours as needed for moderate pain. 05/31/22   Lorella Nimrod, MD  lisinopril (PRINIVIL,ZESTRIL) 20 MG tablet Take 20 mg by mouth daily.    [provider]  metFORMIN (GLUCOPHAGE-XR) 500 MG 24 hr tablet Take 500 mg by mouth daily with supper. 08/15/17   [provider]  NOVOLOG MIX 70/30 FLEXPEN (70-30) 100 UNIT/ML FlexPen Inject 25 Units into the skin 2 (two) times daily after a meal. 10/16/21   [provider]  QUEtiapine (SEROQUEL) 50 MG tablet Take 50 mg by mouth at bedtime. 03/06/22   [provider]    Physical Exam: Vitals:   07/12/22 1744 07/12/22 1800 07/12/22 1830 07/12/22 1900  BP:  (!) 183/67 (!) 169/76 (!) 174/61  Pulse:  87 86 84  Resp:  (!) 23 (!) 24 (!) 24  Temp: 98 F (36.7 C)     TempSrc: Oral     SpO2:  100% 100% 99%   Constitutional: Frail, weak, NAD, calm, comfortable Eyes: PERRL, lids and conjunctivae normal ENMT: Mucous membranes are moist. Posterior pharynx clear of any exudate or lesions.Normal dentition.  Neck: normal, supple, no masses, no thyromegaly Respiratory: clear to auscultation bilaterally, no wheezing, no crackles. Normal respiratory effort. No  accessory muscle use.  Cardiovascular: Regular rate and rhythm, no murmurs / rubs / gallops. No extremity edema. 2+ pedal pulses. No carotid bruits.  Abdomen: Distended, mild tenderness, no masses palpated. No hepatosplenomegaly. Bowel sounds positive.  Musculoskeletal: Good range of motion, no joint swelling or tenderness, Skin: Stage III sacral decubitus ulcer no rashes, lesions, ulcers. No induration Neurologic: CN 2-12 grossly intact. Sensation intact, DTR normal. Strength 5/5 in all 4.  Psychiatric: Normal judgment and insight. Alert and oriented x 3. Normal mood  Data Reviewed:  Sodium 136 potassium 4.9, chloride 107, CO2 21, glucose 282, creatinine 1.01, alkaline phosphatase 150 albumin 3.1, white count 16.4 hemoglobin 10.3.  Urinalysis showed hazy urine with large leukocytes WBC more than 50 and mild bacteria.  CT abdomen pelvis showed large amount of retained stool throughout the colon and rectal vault consistent with his constipation and possible fecal impaction no bowel obstruction or ileus.  Also cholelithiasis without cholecystitis.  Chest x-ray showed no acute findings  Assessment and Plan:  #1 acute  urinary retention: Unable to empty bladder completely but able to urinate now.  Urology has recommended follow-up in 1 to 2 weeks.  We will observe and see patient continues to urinate otherwise in-house urology consult in the morning.  #2 fecal impaction: Initial disimpaction.  Magnesium citrate ordered, will need scheduled laxatives after this.  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  Continue to monitor.  Continue home medications.  #4 essential hypertension: Continue home blood pressure medications.  #5 type 2 diabetes: Continue sliding scale insulin  #6 hyperlipidemia: Continue statin  #7 chronic kidney disease stage III: Appears to be at baseline.  Continue to monitor   Advance Care Planning:   Code Status: Prior DNR  Consults: None  Family  Communication: No family at bedside  Severity of Illness: The appropriate patient status for this patient is INPATIENT. Inpatient status is judged to be reasonable and necessary in order to provide the required intensity of service to ensure the patient's safety. The patient's presenting symptoms, physical exam findings, and initial radiographic and laboratory data in the context of their chronic comorbidities is felt to place them at high risk for further clinical deterioration. Furthermore, it is not anticipated that the patient will be medically stable for discharge from the hospital within 2 midnights of admission.   * I certify that at the point of admission it is my clinical judgment that the patient will require inpatient hospital care spanning beyond 2 midnights from the point of admission due to high intensity of service, high risk for further deterioration and high frequency of surveillance required.*  AuthorBarbette Merino, MD 07/12/2022 7:31 PM  For on call review [email or web address].

## 2022-07-12 NOTE — ED Notes (Signed)
Pt unable to void- Md messaged

## 2022-07-12 NOTE — ED Notes (Signed)
PT voided 22m urine PVR >368

## 2022-07-13 ENCOUNTER — Encounter: Payer: Self-pay | Admitting: Internal Medicine

## 2022-07-13 DIAGNOSIS — R748 Abnormal levels of other serum enzymes: Secondary | ICD-10-CM | POA: Diagnosis not present

## 2022-07-13 DIAGNOSIS — L8990 Pressure ulcer of unspecified site, unspecified stage: Secondary | ICD-10-CM | POA: Insufficient documentation

## 2022-07-13 DIAGNOSIS — R338 Other retention of urine: Secondary | ICD-10-CM

## 2022-07-13 DIAGNOSIS — K5641 Fecal impaction: Secondary | ICD-10-CM | POA: Diagnosis not present

## 2022-07-13 LAB — COMPREHENSIVE METABOLIC PANEL
ALT: 13 U/L (ref 0–44)
AST: 20 U/L (ref 15–41)
Albumin: 3 g/dL — ABNORMAL LOW (ref 3.5–5.0)
Alkaline Phosphatase: 138 U/L — ABNORMAL HIGH (ref 38–126)
Anion gap: 5 (ref 5–15)
BUN: 20 mg/dL (ref 8–23)
CO2: 25 mmol/L (ref 22–32)
Calcium: 9.1 mg/dL (ref 8.9–10.3)
Chloride: 110 mmol/L (ref 98–111)
Creatinine, Ser: 1.08 mg/dL — ABNORMAL HIGH (ref 0.44–1.00)
GFR, Estimated: 51 mL/min — ABNORMAL LOW (ref >60)
Glucose, Bld: 133 mg/dL — ABNORMAL HIGH (ref 70–99)
Potassium: 4.9 mmol/L (ref 3.5–5.1)
Sodium: 140 mmol/L (ref 135–145)
Total Bilirubin: 0.3 mg/dL (ref 0.3–1.2)
Total Protein: 6.2 g/dL — ABNORMAL LOW (ref 6.5–8.1)

## 2022-07-13 LAB — GLUCOSE, CAPILLARY
Glucose-Capillary: 152 mg/dL — ABNORMAL HIGH (ref 70–99)
Glucose-Capillary: 261 mg/dL — ABNORMAL HIGH (ref 70–99)

## 2022-07-13 LAB — CBG MONITORING, ED
Glucose-Capillary: 107 mg/dL — ABNORMAL HIGH (ref 70–99)
Glucose-Capillary: 124 mg/dL — ABNORMAL HIGH (ref 70–99)
Glucose-Capillary: 175 mg/dL — ABNORMAL HIGH (ref 70–99)

## 2022-07-13 LAB — CBC
HCT: 33.3 % — ABNORMAL LOW (ref 36.0–46.0)
Hemoglobin: 10.4 g/dL — ABNORMAL LOW (ref 12.0–15.0)
MCH: 28.6 pg (ref 26.0–34.0)
MCHC: 31.2 g/dL (ref 30.0–36.0)
MCV: 91.5 fL (ref 80.0–100.0)
Platelets: 403 10*3/uL — ABNORMAL HIGH (ref 150–400)
RBC: 3.64 MIL/uL — ABNORMAL LOW (ref 3.87–5.11)
RDW: 14.5 % (ref 11.5–15.5)
WBC: 16.2 10*3/uL — ABNORMAL HIGH (ref 4.0–10.5)
nRBC: 0 % (ref 0.0–0.2)

## 2022-07-13 LAB — TROPONIN I (HIGH SENSITIVITY): Troponin I (High Sensitivity): 14 ng/L (ref <18)

## 2022-07-13 MED ORDER — QUETIAPINE FUMARATE 25 MG PO TABS
50.0000 mg | ORAL_TABLET | Freq: Every day | ORAL | Status: DC
  Administered 2022-07-13: 50 mg via ORAL
  Filled 2022-07-13: qty 2

## 2022-07-13 MED ORDER — ASPIRIN 81 MG PO TBEC
81.0000 mg | DELAYED_RELEASE_TABLET | Freq: Every day | ORAL | Status: DC
  Administered 2022-07-13 – 2022-07-14 (×2): 81 mg via ORAL
  Filled 2022-07-13 (×2): qty 1

## 2022-07-13 MED ORDER — LISINOPRIL 10 MG PO TABS
10.0000 mg | ORAL_TABLET | Freq: Every day | ORAL | Status: DC
  Administered 2022-07-13 – 2022-07-14 (×2): 10 mg via ORAL
  Filled 2022-07-13 (×2): qty 1

## 2022-07-13 MED ORDER — ATORVASTATIN CALCIUM 10 MG PO TABS
10.0000 mg | ORAL_TABLET | Freq: Every day | ORAL | Status: DC
  Administered 2022-07-13 – 2022-07-14 (×2): 10 mg via ORAL
  Filled 2022-07-13 (×2): qty 1

## 2022-07-13 MED ORDER — CLOPIDOGREL BISULFATE 75 MG PO TABS
75.0000 mg | ORAL_TABLET | Freq: Every day | ORAL | Status: DC
  Administered 2022-07-13 – 2022-07-14 (×2): 75 mg via ORAL
  Filled 2022-07-13 (×2): qty 1

## 2022-07-13 MED ORDER — HYDRALAZINE HCL 20 MG/ML IJ SOLN
20.0000 mg | Freq: Four times a day (QID) | INTRAMUSCULAR | Status: DC | PRN
  Administered 2022-07-13: 20 mg via INTRAVENOUS
  Filled 2022-07-13: qty 1

## 2022-07-13 NOTE — ED Notes (Signed)
Pt changed into clean diaper  

## 2022-07-13 NOTE — Consult Note (Addendum)
[facility] Nurse Consult Note: Reason for Consult: Consult requested for sacrum. Performed remotely after review of progress notes and assistance for skin assessment from the bedside nurse via secure chat.   Wound type: She reports area is a red Stage 1 pressure injury to the sacrum with intact skin. Pt is frequently incontinent of urine/stool and it difficult to keep the affected area from becoming soiled.  Pressure Injury POA: Yes Dressing procedure/placement/frequency: Topical treatment orders provided for bedside nurses to perform as follows to protect from further injury: Foam dressing to sacrum, change Q 3 days or PRN soiling. Please re-consult if further assistance is needed.  Thank-you,  Julien Girt MSN, [place], [place], [place], [place]

## 2022-07-13 NOTE — ED Notes (Signed)
Pt brief changed and repositioned with pillow under R side.

## 2022-07-13 NOTE — ED Notes (Signed)
Pt soiled with feces changed into clean diaper.

## 2022-07-13 NOTE — ED Notes (Signed)
Pt turned supine to eat lunch

## 2022-07-13 NOTE — ED Notes (Signed)
Pt repositioned with a pillow under her L side

## 2022-07-13 NOTE — Progress Notes (Addendum)
PROGRESS NOTE    Beth Mcguire  QMG:[id number] DOB: [birth year] DOA: 07/12/2022 PCP: [name], MD    Assessment & Plan:   Principal Problem:   Fecal impaction of colon ([place]) Active Problems:   Osteomyelitis ([place])   Type 2 diabetes mellitus with chronic kidney disease, with long-term current use of insulin ([place])   Hypertension   Hyperlipidemia   Chronic kidney disease (CKD), stage III (moderate) (HCC)   Acute urinary retention   Dementia with behavioral disturbance (HCC)   Thrombocytosis   Normocytic anemia   UTI (urinary tract infection)  Assessment and Plan: Acute urinary retention: unable to empty bladder completely but able to urinate now.  Urology has recommended follow-up in 1 to 2 weeks.    Constipation: w/ possible fecal impaction. Continue on miralax, mg citrate     Sacral decubitus: chronic and present on admission. Stage I pressure injury to sacrum. Continue w/ wound care    HTN: continue on home dose of lisinopril. Hydralazine prn    DM2: poorly controlled, HbA1c 7.7. Continue on SSI w/ accuchecks    HLD: continue on statin    CKDIIIa: Cr is at baseline. Avoid nephrotoxic meds   ACD: likely secondary to CKD. No need for a transfusion currently   Elevated alkaline phosphatase: etiology unclear. Trending down   Leukocytosis: likely reactive. Will continue to monitor   Thrombocytosis: etiology unclear. Will continue to monitor   DVT prophylaxis: lovenox  Code Status: DNR  Family Communication:  Disposition Plan: likely d/c back home   Level of care: Med-Surg  Status is: Inpatient Remains inpatient appropriate because: severity of illness   Consultants:    Procedures:   Antimicrobials:   Subjective: Pt c/o constipation   Objective: Vitals:   07/12/22 2230 07/13/22 0135 07/13/22 0138 07/13/22 0532  BP: (!) 165/60  (!) 157/50 (!) 130/50  Pulse: 86 87 73 72  Resp: '19 18 18 18  '$ Temp:  98.5 F (36.9 C) 98.8 F (37.1 C) 98.3 F (36.8  C)  TempSrc:  Oral Oral Oral  SpO2: 100% 100% 99% 99%    Intake/Output Summary (Last 24 hours) at 07/13/2022 0844 Last data filed at 07/12/2022 2249 Gross per 24 hour  Intake --  Output 555 ml  Net -555 ml   There were no vitals filed for this visit.  Examination:  General exam: Appears calm and comfortable  Respiratory system: Clear to auscultation. Respiratory effort normal. Cardiovascular system: S1 & S2 +. No  rubs, gallops or clicks. Gastrointestinal system: Abdomen is nondistended, soft and nontender.  Hypoactive bowel sounds heard. Central nervous system: Alert and awake Psychiatry: Judgement and insight appears at baseline. Flat mood and affect     Data Reviewed: I have personally reviewed following labs and imaging studies  CBC: Recent Labs  Lab 07/12/22 1344 07/12/22 2339  WBC 16.4* 16.2*  NEUTROABS 14.1*  --   HGB 10.3* 10.4*  HCT 32.1* 33.3*  MCV 92.2 91.5  PLT 419* 326*   Basic Metabolic Panel: Recent Labs  Lab 07/12/22 1344 07/12/22 2339  NA 136 140  K 4.9 4.9  CL 107 110  CO2 21* 25  GLUCOSE 282* 133*  BUN 22 20  CREATININE 1.01* 1.08*  CALCIUM 8.9 9.1   GFR: CrCl cannot be calculated (Unknown ideal weight.). Liver Function Tests: Recent Labs  Lab 07/12/22 1344 07/12/22 2339  AST 18 20  ALT 10 13  ALKPHOS 150* 138*  BILITOT 0.5 0.3  PROT 6.6 6.2*  ALBUMIN 3.1*  3.0*   Recent Labs  Lab 07/12/22 1344  LIPASE 21   No results for input(s): "AMMONIA" in the last 168 hours. Coagulation Profile: No results for input(s): "INR", "PROTIME" in the last 168 hours. Cardiac Enzymes: No results for input(s): "CKTOTAL", "CKMB", "CKMBINDEX", "TROPONINI" in the last 168 hours. BNP (last 3 results) No results for input(s): "PROBNP" in the last 8760 hours. HbA1C: No results for input(s): "HGBA1C" in the last 72 hours. CBG: Recent Labs  Lab 07/12/22 2332 07/13/22 0750  GLUCAP 124* 107*   Lipid Profile: No results for input(s): "CHOL",  "HDL", "LDLCALC", "TRIG", "CHOLHDL", "LDLDIRECT" in the last 72 hours. Thyroid Function Tests: No results for input(s): "TSH", "T4TOTAL", "FREET4", "T3FREE", "THYROIDAB" in the last 72 hours. Anemia Panel: No results for input(s): "VITAMINB12", "FOLATE", "FERRITIN", "TIBC", "IRON", "RETICCTPCT" in the last 72 hours. Sepsis Labs: No results for input(s): "PROCALCITON", "LATICACIDVEN" in the last 168 hours.  No results found for this or any previous visit (from the past 240 hour(s)).       Radiology Studies: CT ABDOMEN PELVIS W CONTRAST  Result Date: 07/12/2022 CLINICAL DATA:  Abdominal pain, colon cancer EXAM: CT ABDOMEN AND PELVIS WITH CONTRAST TECHNIQUE: Multidetector CT imaging of the abdomen and pelvis was performed using the standard protocol following bolus administration of intravenous contrast. RADIATION DOSE REDUCTION: This exam was performed according to the departmental dose-optimization program which includes automated exposure control, adjustment of the mA and/or kV according to patient size and/or use of iterative reconstruction technique. CONTRAST:  12m OMNIPAQUE IOHEXOL 300 MG/ML  SOLN COMPARISON:  12/02/2014 FINDINGS: Lower chest: No acute pleural or parenchymal lung disease. Hepatobiliary: Multiple calcified gallstones fill the gallbladder lumen. No evidence of acute cholecystitis. The liver is unremarkable. No biliary duct dilation or choledocholithiasis. Pancreas: Unremarkable. No pancreatic ductal dilatation or surrounding inflammatory changes. Spleen: Calcified splenic granulomata. Otherwise normal appearance and size of the spleen. Adrenals/Urinary Tract: Cortical scarring upper pole right kidney. Otherwise the kidneys enhance normally and symmetrically. No urinary tract calculi or obstructive uropathy. Left adrenals unremarkable. There is a stable right adrenal adenoma measuring 1.8 cm. Stomach/Bowel: No evidence of bowel obstruction or ileus. There is a large amount of  retained stool throughout the colon, most pronounced in the rectal vault. This may reflect constipation and/or fecal impaction. Normal appendix right lower quadrant. No bowel wall thickening or inflammatory change. Small hiatal hernia. Vascular/Lymphatic: Aortic atherosclerosis. No enlarged abdominal or pelvic lymph nodes. Reproductive: Uterus and bilateral adnexa are unremarkable. Other: No free fluid or free intraperitoneal gas. Postsurgical changes left lower anterior abdominal wall. No abdominal wall hernia. Musculoskeletal: No acute or destructive bony lesions. Reconstructed images demonstrate no additional findings. IMPRESSION: 1. Large amount of retained stool throughout the colon and rectal vault, consistent with constipation and possible fecal impaction. No bowel obstruction or ileus. 2. Cholelithiasis without cholecystitis. 3.  Aortic Atherosclerosis (ICD10-I70.0). Electronically Signed   By: MRanda NgoM.D.   On: 07/12/2022 16:42   DG Chest 2 View  Result Date: 07/12/2022 CLINICAL DATA:  Shortness of breath EXAM: CHEST - 2 VIEW COMPARISON:  Chest CT 12/02/2014 FINDINGS: The cardiac silhouette, mediastinal and hilar contours are within normal limits given the AP projection. The lungs are clear of an acute process. No infiltrates, edema or effusions. No pulmonary lesions. No pneumothorax. The bony thorax is intact. IMPRESSION: No acute cardiopulmonary findings. Electronically Signed   By: PMarijo SanesM.D.   On: 07/12/2022 14:32        Scheduled Meds:  enoxaparin (  LOVENOX) injection  40 mg Subcutaneous Q24H   insulin aspart  0-15 Units Subcutaneous TID WC   insulin aspart  0-5 Units Subcutaneous QHS   magnesium citrate  1 Bottle Oral Once   polyethylene glycol  17 g Oral BID   Continuous Infusions:  cefTRIAXone (ROCEPHIN)  IV     lactated ringers 100 mL/hr at 07/12/22 2336     LOS: 1 day    Time spent: 35 mins    Wyvonnia Dusky, MD [facility] Pager [phone]  [phone]  If 7PM-7AM, please contact night-coverage [email or web address] 07/13/2022, 8:44 AM

## 2022-07-13 NOTE — ED Notes (Addendum)
Admitting MD at bedside. MD made aware of bladder scanner result for urine, as per admitting MD urology made aware and no concerns verbalized at this time

## 2022-07-14 DIAGNOSIS — I10 Essential (primary) hypertension: Secondary | ICD-10-CM | POA: Diagnosis not present

## 2022-07-14 DIAGNOSIS — F03918 Unspecified dementia, unspecified severity, with other behavioral disturbance: Secondary | ICD-10-CM

## 2022-07-14 DIAGNOSIS — K5909 Other constipation: Secondary | ICD-10-CM

## 2022-07-14 LAB — COMPREHENSIVE METABOLIC PANEL
ALT: 10 U/L (ref 0–44)
AST: 10 U/L — ABNORMAL LOW (ref 15–41)
Albumin: 2.5 g/dL — ABNORMAL LOW (ref 3.5–5.0)
Alkaline Phosphatase: 123 U/L (ref 38–126)
Anion gap: 1 — ABNORMAL LOW (ref 5–15)
BUN: 19 mg/dL (ref 8–23)
CO2: 24 mmol/L (ref 22–32)
Calcium: 8.1 mg/dL — ABNORMAL LOW (ref 8.9–10.3)
Chloride: 111 mmol/L (ref 98–111)
Creatinine, Ser: 1.09 mg/dL — ABNORMAL HIGH (ref 0.44–1.00)
GFR, Estimated: 50 mL/min — ABNORMAL LOW (ref >60)
Glucose, Bld: 172 mg/dL — ABNORMAL HIGH (ref 70–99)
Potassium: 4.2 mmol/L (ref 3.5–5.1)
Sodium: 136 mmol/L (ref 135–145)
Total Bilirubin: 0.3 mg/dL (ref 0.3–1.2)
Total Protein: 5.3 g/dL — ABNORMAL LOW (ref 6.5–8.1)

## 2022-07-14 LAB — URINE CULTURE: Culture: <10000 — AB

## 2022-07-14 LAB — CBC
HCT: 27.8 % — ABNORMAL LOW (ref 36.0–46.0)
Hemoglobin: 9.1 g/dL — ABNORMAL LOW (ref 12.0–15.0)
MCH: 29.6 pg (ref 26.0–34.0)
MCHC: 32.7 g/dL (ref 30.0–36.0)
MCV: 90.6 fL (ref 80.0–100.0)
Platelets: 330 10*3/uL (ref 150–400)
RBC: 3.07 MIL/uL — ABNORMAL LOW (ref 3.87–5.11)
RDW: 14.6 % (ref 11.5–15.5)
WBC: 9.7 10*3/uL (ref 4.0–10.5)
nRBC: 0 % (ref 0.0–0.2)

## 2022-07-14 LAB — GLUCOSE, CAPILLARY: Glucose-Capillary: 143 mg/dL — ABNORMAL HIGH (ref 70–99)

## 2022-07-14 MED ORDER — CEFDINIR 300 MG PO CAPS: 300.0000 mg | ORAL_CAPSULE | Freq: Two times a day (BID) | ORAL | 0 refills | Status: AC

## 2022-07-14 NOTE — Discharge Summary (Addendum)
Physician Discharge Summary  Beth Mcguire YIR:[id number] DOB: 1941-09-11 DOA: 07/12/2022  PCP: [name], MD  Admit date: 07/12/2022 Discharge date: 07/14/2022  Admitted From: home  Disposition:  home   Recommendations for Outpatient Follow-up:  Follow up with PCP in 1-2 weeks   Home Health: no  Equipment/Devices:  Discharge Condition: stable  CODE STATUS: full  Diet recommendation: Heart Healthy / Carb Modified  Brief/Interim Summary: HPI was taken from Dr. Jonelle Sidle: Beth Mcguire is a 81 y.o. female with medical history significant of colon cancer, dementia, diabetes, essential hypertension, peripheral vascular disease who presented with abdominal pain and pain in the tailbone.  Patient was admitted with shortness of breath at [facility].  Came to the ER where she was seen and evaluated.  Also has shortness of breath.  Patient was evaluated in the ER seems to have urinary retention.  Further evaluation including CT abdomen showed severe fecal impaction.  Patient was partially disimpacted in the ER.  Patient continues to have some mild shortness of breath.  In and out cath failed.  Urology consulted with recommendations for outpatient follow-up.  She has a residual about 300 after voiding.  Patient also has evidence of UTI.  Denied chest pain.  Denied any other complaints.  At this point patient is being admitted for further evaluation of urinary retention and fecal impaction.  As per Dr. Jimmye Norman 10/6-10/7/23: Pt was found to have constipation w/ possible fecal impaction. Pt did have a couple of bowel movements w/ miralax & mg citrate. Of note, pt was initially though to have urinary retention but then pt was able to urinate independently.   Discharge Diagnoses:  Principal Problem:   Fecal impaction of colon ([place]) Active Problems:   Type 2 diabetes mellitus with chronic kidney disease, with long-term current use of insulin (HCC)   Hypertension   Hyperlipidemia   Chronic kidney  disease (CKD), stage III (moderate) (HCC)   Acute urinary retention   Dementia with behavioral disturbance (HCC)   Thrombocytosis   Normocytic anemia   UTI (urinary tract infection)   Pressure injury of skin  Acute urinary retention: unable to empty bladder completely but able to urinate now.  Urology has recommended follow-up in 1 to 2 weeks.    Constipation: w/ possible fecal impaction. Resolved w/ miralax, mg citrate    Sacral decubitus: chronic and present on admission. Stage I pressure injury to sacrum. Continue w/ wound care. No osteomyelitis   Dementia: continue w/ supportive care.   HTN: continue on home dose of lisinopril. Hydralazine prn    DM2: poorly controlled, HbA1c 7.7. Continue on SSI w/ accuchecks    HLD: continue on statin    CKDIIIa: Cr is at baseline. Avoid nephrotoxic meds    ACD: likely secondary to CKD. No need for a transfusion currently    Elevated alkaline phosphatase: resolved    Leukocytosis: resolved    Thrombocytosis: resolved   Discharge Instructions  Discharge Instructions     Diet - low sodium heart healthy   Complete by: As directed    Diet Carb Modified   Complete by: As directed    Discharge instructions   Complete by: As directed    F/u w/ PCP in 1-2 weeks   Discharge wound care:   Complete by: As directed    Foam dressing to sacrum, change every 3 days or PRN soiling   Increase activity slowly   Complete by: As directed       Allergies as of  07/14/2022       Reactions   Amoxicillin-pot Clavulanate Rash   Zosyn [piperacillin Sod-tazobactam So] Rash        Medication List     STOP taking these medications    HYDROcodone-acetaminophen 5-325 MG tablet Commonly known as: NORCO/VICODIN       TAKE these medications    aspirin 81 MG tablet Take 1 tablet (81 mg total) by mouth daily.   atorvastatin 10 MG tablet Commonly known as: LIPITOR Take 10 mg every evening by mouth.   cefdinir 300 MG capsule Commonly  known as: OMNICEF Take 1 capsule (300 mg total) by mouth 2 (two) times daily for 3 days.   clopidogrel 75 MG tablet Commonly known as: PLAVIX TAKE 1 TABLET BY MOUTH EVERY DAY   glucose monitoring kit monitoring kit 1 each by Does not apply route 4 (four) times daily -  before meals and at bedtime.   lisinopril 10 MG tablet Commonly known as: ZESTRIL Take 10 mg by mouth in the morning.   loratadine 10 MG dissolvable tablet Commonly known as: CLARITIN REDITABS Take 10 mg by mouth daily as needed for allergies.   metFORMIN 500 MG 24 hr tablet Commonly known as: GLUCOPHAGE-XR Take 500 mg by mouth daily with supper.   NovoLOG Mix 70/30 FlexPen (70-30) 100 UNIT/ML FlexPen Generic drug: insulin aspart protamine - aspart Inject 22-28 Units into the skin See admin instructions. Inject 28u under the skin ever morning after breakfast and inject 22u under the skin every evening after supper   QUEtiapine 50 MG tablet Commonly known as: SEROQUEL Take 50 mg by mouth every evening.               Discharge Care Instructions  (From admission, onward)           Start     Ordered   07/14/22 0000  Discharge wound care:       Comments: Foam dressing to sacrum, change every 3 days or PRN soiling   07/14/22 1131            Allergies  Allergen Reactions   Amoxicillin-Pot Clavulanate Rash   Zosyn [Piperacillin Sod-Tazobactam So] Rash    Consultations:   Procedures/Studies: CT ABDOMEN PELVIS W CONTRAST  Result Date: 07/12/2022 CLINICAL DATA:  Abdominal pain, colon cancer EXAM: CT ABDOMEN AND PELVIS WITH CONTRAST TECHNIQUE: Multidetector CT imaging of the abdomen and pelvis was performed using the standard protocol following bolus administration of intravenous contrast. RADIATION DOSE REDUCTION: This exam was performed according to the departmental dose-optimization program which includes automated exposure control, adjustment of the mA and/or kV according to patient size  and/or use of iterative reconstruction technique. CONTRAST:  182m OMNIPAQUE IOHEXOL 300 MG/ML  SOLN COMPARISON:  12/02/2014 FINDINGS: Lower chest: No acute pleural or parenchymal lung disease. Hepatobiliary: Multiple calcified gallstones fill the gallbladder lumen. No evidence of acute cholecystitis. The liver is unremarkable. No biliary duct dilation or choledocholithiasis. Pancreas: Unremarkable. No pancreatic ductal dilatation or surrounding inflammatory changes. Spleen: Calcified splenic granulomata. Otherwise normal appearance and size of the spleen. Adrenals/Urinary Tract: Cortical scarring upper pole right kidney. Otherwise the kidneys enhance normally and symmetrically. No urinary tract calculi or obstructive uropathy. Left adrenals unremarkable. There is a stable right adrenal adenoma measuring 1.8 cm. Stomach/Bowel: No evidence of bowel obstruction or ileus. There is a large amount of retained stool throughout the colon, most pronounced in the rectal vault. This may reflect constipation and/or fecal impaction. Normal appendix right lower quadrant. No  bowel wall thickening or inflammatory change. Small hiatal hernia. Vascular/Lymphatic: Aortic atherosclerosis. No enlarged abdominal or pelvic lymph nodes. Reproductive: Uterus and bilateral adnexa are unremarkable. Other: No free fluid or free intraperitoneal gas. Postsurgical changes left lower anterior abdominal wall. No abdominal wall hernia. Musculoskeletal: No acute or destructive bony lesions. Reconstructed images demonstrate no additional findings. IMPRESSION: 1. Large amount of retained stool throughout the colon and rectal vault, consistent with constipation and possible fecal impaction. No bowel obstruction or ileus. 2. Cholelithiasis without cholecystitis. 3.  Aortic Atherosclerosis (ICD10-I70.0). Electronically Signed   By: Randa Ngo M.D.   On: 07/12/2022 16:42   DG Chest 2 View  Result Date: 07/12/2022 CLINICAL DATA:  Shortness of  breath EXAM: CHEST - 2 VIEW COMPARISON:  Chest CT 12/02/2014 FINDINGS: The cardiac silhouette, mediastinal and hilar contours are within normal limits given the AP projection. The lungs are clear of an acute process. No infiltrates, edema or effusions. No pulmonary lesions. No pneumothorax. The bony thorax is intact. IMPRESSION: No acute cardiopulmonary findings. Electronically Signed   By: Marijo Sanes M.D.   On: 07/12/2022 14:32   (Echo, Carotid, EGD, Colonoscopy, ERCP)    Subjective: Pt denies any complaints and is ready to go home    Discharge Exam: Vitals:   07/14/22 0439 07/14/22 0845  BP: (!) 135/44 (!) 163/52  Pulse: 95 82  Resp: 16 17  Temp: 99.3 F (37.4 C) 98.8 F (37.1 C)  SpO2: 96% 99%   Vitals:   07/13/22 1504 07/13/22 1945 07/14/22 0439 07/14/22 0845  BP: (!) 172/49 (!) 132/49 (!) 135/44 (!) 163/52  Pulse: 74 99 95 82  Resp: '18 20 16 17  ' Temp: 98.9 F (37.2 C) 99.3 F (37.4 C) 99.3 F (37.4 C) 98.8 F (37.1 C)  TempSrc: Oral Oral    SpO2: 99% 100% 96% 99%  Weight: 73 kg     Height: '5\' 2"'  (1.575 m)       General: Pt is alert, awake, not in acute distress Cardiovascular: S1/S2 +, no rubs, no gallops Respiratory: CTA bilaterally, no wheezing, no rhonchi Abdominal: Soft, NT, ND, bowel sounds + Extremities:  no cyanosis    The results of significant diagnostics from this hospitalization (including imaging, microbiology, ancillary and laboratory) are listed below for reference.     Microbiology: Recent Results (from the past 240 hour(s))  Urine Culture     Status: Abnormal   Collection Time: 07/12/22 11:36 PM   Specimen: Urine, Clean Catch  Result Value Ref Range Status   Specimen Description   Final    URINE, CLEAN CATCH Performed at [facility], [address] [place], [place] [address]    Special Requests   Final    NONE Performed at [facility], [address] [place], [place] [address]    Culture (A)  Final    <10,000  COLONIES/mL INSIGNIFICANT GROWTH Performed at [place] [address] [place], [place] [address]    Report Status 07/14/2022 FINAL  Final     Labs: BNP (last 3 results) No results for input(s): "BNP" in the last 8760 hours. Basic Metabolic Panel: Recent Labs  Lab 07/12/22 1344 07/12/22 2339 07/14/22 0615  NA 136 140 136  K 4.9 4.9 4.2  CL 107 110 111  CO2 21* 25 24  GLUCOSE 282* 133* 172*  BUN '22 20 19  ' CREATININE 1.01* 1.08* 1.09*  CALCIUM 8.9 9.1 8.1*   Liver Function Tests: Recent Labs  Lab 07/12/22  1344 07/12/22 2339 07/14/22 0615  AST 18 20 10*  ALT '10 13 10  ' ALKPHOS 150* 138* 123  BILITOT 0.5 0.3 0.3  PROT 6.6 6.2* 5.3*  ALBUMIN 3.1* 3.0* 2.5*   Recent Labs  Lab 07/12/22 1344  LIPASE 21   No results for input(s): "AMMONIA" in the last 168 hours. CBC: Recent Labs  Lab 07/12/22 1344 07/12/22 2339 07/14/22 0615  WBC 16.4* 16.2* 9.7  NEUTROABS 14.1*  --   --   HGB 10.3* 10.4* 9.1*  HCT 32.1* 33.3* 27.8*  MCV 92.2 91.5 90.6  PLT 419* 403* 330   Cardiac Enzymes: No results for input(s): "CKTOTAL", "CKMB", "CKMBINDEX", "TROPONINI" in the last 168 hours. BNP: Invalid input(s): "POCBNP" CBG: Recent Labs  Lab 07/13/22 0750 07/13/22 1159 07/13/22 1617 07/13/22 2104 07/14/22 0810  GLUCAP 107* 175* 152* 261* 143*   D-Dimer No results for input(s): "DDIMER" in the last 72 hours. Hgb A1c No results for input(s): "HGBA1C" in the last 72 hours. Lipid Profile No results for input(s): "CHOL", "HDL", "LDLCALC", "TRIG", "CHOLHDL", "LDLDIRECT" in the last 72 hours. Thyroid function studies No results for input(s): "TSH", "T4TOTAL", "T3FREE", "THYROIDAB" in the last 72 hours.  Invalid input(s): "FREET3" Anemia work up No results for input(s): "VITAMINB12", "FOLATE", "FERRITIN", "TIBC", "IRON", "RETICCTPCT" in the last 72 hours. Urinalysis    Component Value Date/Time   COLORURINE YELLOW (A) 07/12/2022 1708   APPEARANCEUR HAZY (A)  07/12/2022 1708   LABSPEC 1.016 07/12/2022 1708   PHURINE 8.0 07/12/2022 1708   GLUCOSEU 50 (A) 07/12/2022 1708   HGBUR NEGATIVE 07/12/2022 1708   BILIRUBINUR NEGATIVE 07/12/2022 1708   KETONESUR NEGATIVE 07/12/2022 1708   PROTEINUR NEGATIVE 07/12/2022 1708   NITRITE NEGATIVE 07/12/2022 1708   LEUKOCYTESUR LARGE (A) 07/12/2022 1708   Sepsis Labs Recent Labs  Lab 07/12/22 1344 07/12/22 2339 07/14/22 0615  WBC 16.4* 16.2* 9.7   Microbiology Recent Results (from the past 240 hour(s))  Urine Culture     Status: Abnormal   Collection Time: 07/12/22 11:36 PM   Specimen: Urine, Clean Catch  Result Value Ref Range Status   Specimen Description   Final    URINE, CLEAN CATCH Performed at [facility], [address], [place], [place] [address]    Special Requests   Final    NONE Performed at [facility], [address] [place], [place] [address]    Culture (A)  Final    <10,000 COLONIES/mL INSIGNIFICANT GROWTH Performed at [facility], [place] [address], [place],  [address]    Report Status 07/14/2022 FINAL  Final     Time coordinating discharge: Over 30 minutes  SIGNED:   Wyvonnia Dusky, MD  [facility] 07/14/2022, 1:11 PM Pager   If 7PM-7AM, please contact night-coverage [email or web address]

## 2022-07-21 ENCOUNTER — Emergency Department: Payer: Medicare HMO

## 2022-07-21 ENCOUNTER — Other Ambulatory Visit: Payer: Self-pay

## 2022-07-21 ENCOUNTER — Emergency Department
Admission: EM | Admit: 2022-07-21 | Discharge: 2022-07-21 | Disposition: A | Discharge status: Home / Self Care | Payer: Medicare HMO | Attending: Student in an Organized Health Care Education/Training Program | Admitting: Student in an Organized Health Care Education/Training Program

## 2022-07-21 DIAGNOSIS — R0600 Dyspnea, unspecified: Secondary | ICD-10-CM | POA: Diagnosis not present

## 2022-07-21 DIAGNOSIS — Z20822 Contact with and (suspected) exposure to covid-19: Secondary | ICD-10-CM | POA: Insufficient documentation

## 2022-07-21 DIAGNOSIS — R0602 Shortness of breath: Secondary | ICD-10-CM | POA: Insufficient documentation

## 2022-07-21 DIAGNOSIS — Z8616 Personal history of COVID-19: Secondary | ICD-10-CM | POA: Insufficient documentation

## 2022-07-21 LAB — URINALYSIS, ROUTINE W REFLEX MICROSCOPIC
Bilirubin Urine: NEGATIVE
Glucose, UA: NEGATIVE mg/dL
Ketones, ur: NEGATIVE mg/dL
Leukocytes,Ua: NEGATIVE
Nitrite: NEGATIVE
Protein, ur: NEGATIVE mg/dL
Specific Gravity, Urine: 1.011 (ref 1.005–1.030)
Squamous Epithelial / HPF: NONE SEEN (ref 0–5)
pH: 9 — ABNORMAL HIGH (ref 5.0–8.0)

## 2022-07-21 LAB — COMPREHENSIVE METABOLIC PANEL
ALT: 14 U/L (ref 0–44)
AST: 17 U/L (ref 15–41)
Albumin: 3.4 g/dL — ABNORMAL LOW (ref 3.5–5.0)
Alkaline Phosphatase: 147 U/L — ABNORMAL HIGH (ref 38–126)
Anion gap: 5 (ref 5–15)
BUN: 17 mg/dL (ref 8–23)
CO2: 23 mmol/L (ref 22–32)
Calcium: 8.7 mg/dL — ABNORMAL LOW (ref 8.9–10.3)
Chloride: 107 mmol/L (ref 98–111)
Creatinine, Ser: 1.15 mg/dL — ABNORMAL HIGH (ref 0.44–1.00)
GFR, Estimated: 47 mL/min — ABNORMAL LOW (ref >60)
Glucose, Bld: 220 mg/dL — ABNORMAL HIGH (ref 70–99)
Potassium: 5.1 mmol/L (ref 3.5–5.1)
Sodium: 135 mmol/L (ref 135–145)
Total Bilirubin: 0.3 mg/dL (ref 0.3–1.2)
Total Protein: 7.1 g/dL (ref 6.5–8.1)

## 2022-07-21 LAB — CBC WITH DIFFERENTIAL/PLATELET
Abs Immature Granulocytes: 0.08 10*3/uL — ABNORMAL HIGH (ref 0.00–0.07)
Basophils Absolute: 0.1 10*3/uL (ref 0.0–0.1)
Basophils Relative: 1 %
Eosinophils Absolute: 0.2 10*3/uL (ref 0.0–0.5)
Eosinophils Relative: 2 %
HCT: 35.7 % — ABNORMAL LOW (ref 36.0–46.0)
Hemoglobin: 11.2 g/dL — ABNORMAL LOW (ref 12.0–15.0)
Immature Granulocytes: 1 %
Lymphocytes Relative: 30 %
Lymphs Abs: 3.3 10*3/uL (ref 0.7–4.0)
MCH: 29 pg (ref 26.0–34.0)
MCHC: 31.4 g/dL (ref 30.0–36.0)
MCV: 92.5 fL (ref 80.0–100.0)
Monocytes Absolute: 0.4 10*3/uL (ref 0.1–1.0)
Monocytes Relative: 4 %
Neutro Abs: 6.9 10*3/uL (ref 1.7–7.7)
Neutrophils Relative %: 62 %
Platelets: 519 10*3/uL — ABNORMAL HIGH (ref 150–400)
RBC: 3.86 MIL/uL — ABNORMAL LOW (ref 3.87–5.11)
RDW: 13.7 % (ref 11.5–15.5)
WBC: 11 10*3/uL — ABNORMAL HIGH (ref 4.0–10.5)
nRBC: 0 % (ref 0.0–0.2)

## 2022-07-21 LAB — RESP PANEL BY RT-PCR (FLU A&B, COVID) ARPGX2
Influenza A by PCR: NEGATIVE
Influenza B by PCR: NEGATIVE
SARS Coronavirus 2 by RT PCR: NEGATIVE

## 2022-07-21 LAB — BRAIN NATRIURETIC PEPTIDE: B Natriuretic Peptide: 34.1 pg/mL (ref 0.0–100.0)

## 2022-07-21 LAB — BETA-HYDROXYBUTYRIC ACID: Beta-Hydroxybutyric Acid: 0.1 mmol/L (ref 0.05–0.27)

## 2022-07-21 LAB — TROPONIN I (HIGH SENSITIVITY)
Troponin I (High Sensitivity): 6 ng/L (ref <18)
Troponin I (High Sensitivity): 6 ng/L (ref <18)

## 2022-07-21 MED ORDER — IPRATROPIUM-ALBUTEROL 0.5-2.5 (3) MG/3ML IN SOLN
3.0000 mL | Freq: Once | RESPIRATORY_TRACT | Status: AC
  Administered 2022-07-21: 3 mL via RESPIRATORY_TRACT
  Filled 2022-07-21: qty 3

## 2022-07-21 MED ORDER — IOHEXOL 350 MG/ML SOLN
75.0000 mL | Freq: Once | INTRAVENOUS | Status: AC | PRN
  Administered 2022-07-21: 75 mL via INTRAVENOUS

## 2022-07-21 MED ORDER — QUETIAPINE FUMARATE 25 MG PO TABS
25.0000 mg | ORAL_TABLET | Freq: Every day | ORAL | Status: DC
  Administered 2022-07-21: 25 mg via ORAL
  Filled 2022-07-21: qty 1

## 2022-07-21 NOTE — ED Triage Notes (Signed)
Pt states she was sitting in her chair and started having SHOB- pt states this has never happened before- pt tachypnic- pt states she feels like she cannot catch her breath- pt cannot speak a whole sentence without catching her breath

## 2022-07-21 NOTE — ED Provider Triage Note (Signed)
  Emergency Medicine Provider Triage Evaluation Note  Beth Mcguire , a 81 y.o.female,  was evaluated in triage.  Pt complains of shortness of breath.  Patient states that she became short of breath earlier this morning when her family was putting her in a chair.  Denies any pain at this time.   Review of Systems  Positive: Shortness of breath Negative: Denies fever, chest pain, vomiting  Physical Exam  There were no vitals filed for this visit. Gen:   Awake, no distress   Resp:  Mildly labored breathing. MSK:   Moves extremities without difficulty  Other:    Medical Decision Making  Given the patient's initial medical screening exam, the following diagnostic evaluation has been ordered. The patient will be placed in the appropriate treatment space, once one is available, to complete the evaluation and treatment. I have discussed the plan of care with the patient and I have advised the patient that an ED physician or mid-level practitioner will reevaluate their condition after the test results have been received, as the results may give them additional insight into the type of treatment they may need.    Diagnostics: Labs, EKG, CXR.  Treatments: none immediately   Teodoro Spray, [place] 07/21/22 1322

## 2022-07-21 NOTE — ED Provider Notes (Signed)
[facility] Provider Note    Event Date/Time   First MD Initiated Contact with Patient 07/21/22 1739     (approximate)   History   Shortness of Breath   HPI  Beth Mcguire is a 81 y.o. female presents the ER for evaluation of shortness of breath that started around lunch today.  States that hit her all of a sudden.  She does have some discomfort with deep inspiration.  No fevers or chills.  Denies any dysuria no abdominal pain.  Just recently admitted not found to be hypoxic.  She is not hypoxic now denies any wheezing no productive cough.  Has been around some family members that were diagnosed with bronchitis.  She does not smoke.     Physical Exam   Triage Vital Signs: ED Triage Vitals  Enc Vitals Group     BP 07/21/22 1318 (!) 170/71     Pulse Rate 07/21/22 1318 89     Resp 07/21/22 1318 (!) 26     Temp 07/21/22 1321 97.8 F (36.6 C)     Temp Source 07/21/22 1321 Oral     SpO2 07/21/22 1318 100 %     Weight 07/21/22 1319 160 lb (72.6 kg)     Height 07/21/22 1319 '5\' 2"'$  (1.575 m)     Head Circumference --      Peak Flow --      Pain Score 07/21/22 1319 0     Pain Loc --      Pain Edu? --      Excl. in [place]? --     Most recent vital signs: Vitals:   07/21/22 2040 07/21/22 2104  BP: (!) 176/64 (!) 181/49  Pulse: 66 69  Resp:  20  Temp:    SpO2: 100% 100%     Constitutional: Alert, anxious appearing Eyes: Conjunctivae are normal.  Head: Atraumatic. Nose: No congestion/rhinnorhea. Mouth/Throat: Mucous membranes are moist.   Neck: Painless ROM.  Cardiovascular:   Good peripheral circulation. Respiratory: Mild tachypnea with clear breath sounds throughout.  No wheezing or rhonchi. Gastrointestinal: Soft and nontender.  Musculoskeletal:  no deformity bilateral lower extremity edema.  Neurologic:  MAE spontaneously. No gross focal neurologic deficits are appreciated.  Skin:  Skin is warm, dry and intact. No rash noted. Psychiatric:  Mood and affect are normal. Speech and behavior are normal.    ED Results / Procedures / Treatments   Labs (all labs ordered are listed, but only abnormal results are displayed) Labs Reviewed  COMPREHENSIVE METABOLIC PANEL - Abnormal; Notable for the following components:      Result Value   Glucose, Bld 220 (*)    Creatinine, Ser 1.15 (*)    Calcium 8.7 (*)    Albumin 3.4 (*)    Alkaline Phosphatase 147 (*)    GFR, Estimated 47 (*)    All other components within normal limits  CBC WITH DIFFERENTIAL/PLATELET - Abnormal; Notable for the following components:   WBC 11.0 (*)    RBC 3.86 (*)    Hemoglobin 11.2 (*)    HCT 35.7 (*)    Platelets 519 (*)    Abs Immature Granulocytes 0.08 (*)    All other components within normal limits  URINALYSIS, ROUTINE W REFLEX MICROSCOPIC - Abnormal; Notable for the following components:   Color, Urine STRAW (*)    APPearance CLEAR (*)    pH 9.0 (*)    Hgb urine dipstick SMALL (*)    Bacteria, UA  RARE (*)    All other components within normal limits  RESP PANEL BY RT-PCR (FLU A&B, COVID) ARPGX2  BETA-HYDROXYBUTYRIC ACID  BRAIN NATRIURETIC PEPTIDE  TROPONIN I (HIGH SENSITIVITY)  TROPONIN I (HIGH SENSITIVITY)     EKG  ED ECG REPORT I, Merlyn Lot, the attending physician, personally viewed and interpreted this ECG.   Date: 07/21/2022  EKG Time: 13:22  Rate: 75  Rhythm: sinus  Axis: normal  Intervals: normal qt  ST&T Change: no stemi, no depression    RADIOLOGY Please see ED Course for my review and interpretation.  I personally reviewed all radiographic images ordered to evaluate for the above acute complaints and reviewed radiology reports and findings.  These findings were personally discussed with the patient.  Please see medical record for radiology report.    PROCEDURES:  Critical Care performed: No  Procedures   MEDICATIONS ORDERED IN ED: Medications  QUEtiapine (SEROQUEL) tablet 25 mg (25 mg Oral Given  07/21/22 2004)  iohexol (OMNIPAQUE) 350 MG/ML injection 75 mL (75 mLs Intravenous Contrast Given 07/21/22 1952)  ipratropium-albuterol (DUONEB) 0.5-2.5 (3) MG/3ML nebulizer solution 3 mL (3 mLs Nebulization Given 07/21/22 2055)     IMPRESSION / MDM / ASSESSMENT AND PLAN / ED COURSE  I reviewed the triage vital signs and the nursing notes.                              Differential diagnosis includes, but is not limited to, Asthma, copd, CHF, pna, ptx, malignancy, Pe, anemia  Patient presenting to the ER for evaluation of symptoms as described above.  Based on symptoms, risk factors and considered above differential, this presenting complaint could reflect a potentially life-threatening illness therefore the patient will be placed on continuous pulse oximetry and telemetry for monitoring.  Laboratory evaluation will be sent to evaluate for the above complaints.  She is satting well on room air she is mildly tachypneic but has clear breath sounds throughout no history of COPD.  She does appear anxious.  We will give her evening Seroquel lower dose a little bit early.  Her troponin is negative.  EKG is nonischemic.  Will order CTA to evaluate for PE.   Clinical Course as of 07/21/22 2136  Sat Jul 21, 2022  1840 Lance Bosch is normal.  BNP is normal negative COVID.  She is not acidotic.  She not in DKA.  Will await CT imaging. [PR]  2015 CTA on my review and interpretation does not show any evidence of PE.  Do not appreciate any significant edema or consolidation. [PR]  2107 Patient feels that breathing is got improved after Seroquel.  Admits to feeling quite anxious.  Denies any abdominal pain.  No chest pain at this point.  Her work-up is reassuring she is not requiring any supplemental O2 after being observed in the ER now 8 hours on room air.  At this point believe she stable and appropriate for outpatient follow-up. [PR]    Clinical Course User Index [PR] Merlyn Lot, MD    FINAL CLINICAL  IMPRESSION(S) / ED DIAGNOSES   Final diagnoses:  Dyspnea, unspecified type     Rx / DC Orders   ED Discharge Orders     None        Note:  This document was prepared using Dragon voice recognition software and may include unintentional dictation errors.    Merlyn Lot, MD 07/21/22 2136

## 2022-07-21 NOTE — ED Triage Notes (Signed)
Pt in via EMS from home with c/o sob. EMS reports pt became SOB 1 hour PTA. Pt was 100% on RA butr was placed on 2L of 02 for comfort. Hx of dementia. 172/98, HR 78, CBG 332. Pt is diabetic and did take insulin this am. EMS reports there were several family members at the house and some had covid and some had bronchitis

## 2022-07-21 NOTE — ED Notes (Signed)
Family request bladder scan, amount 117 ml. Pt placed on external catheter. Room air saturation 100% RR 18.MD aware.

## 2022-07-26 ENCOUNTER — Ambulatory Visit: Payer: Self-pay | Admitting: Urology

## 2022-08-02 ENCOUNTER — Encounter: Payer: Self-pay | Admitting: Urology

## 2022-08-02 ENCOUNTER — Ambulatory Visit (INDEPENDENT_AMBULATORY_CARE_PROVIDER_SITE_OTHER): Payer: Medicare HMO | Admitting: Urology

## 2022-08-02 VITALS — BP 124/75 | HR 69 | Ht 63.0 in

## 2022-08-02 DIAGNOSIS — R338 Other retention of urine: Secondary | ICD-10-CM

## 2022-08-02 DIAGNOSIS — N3281 Overactive bladder: Secondary | ICD-10-CM | POA: Diagnosis not present

## 2022-08-02 LAB — BLADDER SCAN AMB NON-IMAGING

## 2022-08-02 NOTE — Progress Notes (Signed)
08/02/22 12:21 PM   Beth Mcguire 06-Oct-1941 [id number]  CC: Incomplete bladder emptying  HPI: 81 year old female who was seen in the ER on 07/12/2022 and ultimately admitted for fecal impaction.  She also had some bladder distention on CT at that time, mildly elevated bladder scan of 300 mL, and nurses were unable to place a Foley catheter.  Her urinary symptoms improved after disimpaction.  She was seen in the ER about a week later for shortness of breath and bladder scan at that time was normal at 100 mL.  Renal function is stable with creatinine 1.15, EGFR 47 over the last year.  She has some occasional urgency but really denies any urinary complaints today.  Her daughter helps provide most of the history.  She denies any gross hematuria or dysuria.  Urine culture from ER visit was negative.  Postvoid residual bladder can today normal at 42m.   PMH: Past Medical History:  Diagnosis Date   Anxiety    Colon cancer ([facility]    Dementia ([facility] 2022   Diabetes ([facility]    Hypertension    Peripheral vascular disease ([place]     Surgical History: Past Surgical History:  Procedure Laterality Date   AMPUTATION Right 01/14/2016   Procedure: AMPUTATION RAY;  Surgeon: MAlbertine Patricia DPM;  Location: [facility];  Service: Podiatry;  Laterality: Right;   AMPUTATION Left 11/26/2021   Procedure: AMPUTATION FIRST RAY;  Surgeon: CSharlotte Alamo DPM;  Location: [facility] OR[facility];  Service: Podiatry;  Laterality: Left;   AMPUTATION TOE Left 02/11/2022   Procedure: Left Second Toe Amputation;  Surgeon: FSamara Deist DPM;  Location: [facility];  Service: Podiatry;  Laterality: Left;   AMPUTATION TOE Left 05/30/2022   Procedure: AMPUTATION TOE , LEFT THIRD TOE;  Surgeon: CSharlotte Alamo DPM;  Location: [facility];  Service: Podiatry;  Laterality: Left;   COLON SURGERY     LOWER EXTREMITY ANGIOGRAPHY Left 08/14/2017   Procedure: Lower Extremity Angiography;  Surgeon: DAlgernon Huxley MD;  [facility];  Service:  Cardiovascular;  Laterality: Left;   LOWER EXTREMITY ANGIOGRAPHY Left 11/06/2021   Procedure: LOWER EXTREMITY ANGIOGRAPHY;  Surgeon: DAlgernon Huxley MD;  [facility];  Service: Cardiovascular;  Laterality: Left;   PERIPHERAL VASCULAR CATHETERIZATION N/A 01/05/2016   Procedure: Lower Extremity Angiography;  Surgeon: JAlgernon Huxley MD;  [facility];  Service: Cardiovascular;  Laterality: N/A;   PERIPHERAL VASCULAR CATHETERIZATION  01/05/2016   Procedure: Lower Extremity Intervention;  Surgeon: JAlgernon Huxley MD;  [facility];  Service: Cardiovascular;;   PERIPHERAL VASCULAR CATHETERIZATION Right 06/25/2016   Procedure: Lower Extremity Angiography;  Surgeon: JAlgernon Huxley MD;  [facility];  Service: Cardiovascular;  Laterality: Right;   PERIPHERAL VASCULAR CATHETERIZATION  06/25/2016   Procedure: Lower Extremity Intervention;  Surgeon: JAlgernon Huxley MD;  [facility];  Service: Cardiovascular;;   TONSILLECTOMY     Family History: Family History  Problem Relation Age of Onset   Diabetes Other     Social History:  reports that she has never smoked. She has never been exposed to tobacco smoke. She has never used smokeless tobacco. She reports that she does not drink alcohol and does not use drugs.  Physical Exam: BP 124/75   Pulse 69   Ht '5\' 3"'  (1.6 m)   BMI 28.34 kg/m    Constitutional:  Alert and oriented, No acute distress.  Frail-appearing, in  wheelchair Cardiovascular: No clubbing, cyanosis, or edema. Respiratory: Normal respiratory effort, no increased work of breathing. GI: Abdomen is soft, nontender, nondistended, no abdominal masses   Laboratory Data: Reviewed  Pertinent Imaging: I have personally viewed and interpreted the CT from 10/5 showing fecal impaction, distended bladder, no hydronephrosis.  Assessment & Plan:   81 year old female with brief episode of incomplete bladder emptying  associated with fecal impaction, since resolved.  Denies any significant urinary complaints today aside from some occasional urgency and urge incontinence.  We discussed the risks of OAB medications and they opt to defer at this time.  Behavioral strategies discussed including avoiding diet drinks and sodas, tea, and timed voiding.  Follow-up with urology as needed  Nickolas Madrid, MD 08/02/2022  [place] [address] [place], [place] [address] [phone]

## 2022-08-31 ENCOUNTER — Encounter: Payer: Self-pay | Admitting: Podiatry

## 2022-09-11 ENCOUNTER — Ambulatory Visit (INDEPENDENT_AMBULATORY_CARE_PROVIDER_SITE_OTHER): Payer: Medicare HMO | Admitting: Vascular Surgery

## 2022-09-11 ENCOUNTER — Ambulatory Visit (INDEPENDENT_AMBULATORY_CARE_PROVIDER_SITE_OTHER): Payer: Medicare HMO

## 2022-09-11 ENCOUNTER — Encounter (INDEPENDENT_AMBULATORY_CARE_PROVIDER_SITE_OTHER): Payer: Self-pay | Admitting: Vascular Surgery

## 2022-09-11 VITALS — BP 152/59 | HR 70 | Resp 17 | Ht 62.0 in | Wt 165.0 lb

## 2022-09-11 DIAGNOSIS — I10 Essential (primary) hypertension: Secondary | ICD-10-CM | POA: Diagnosis not present

## 2022-09-11 DIAGNOSIS — E785 Hyperlipidemia, unspecified: Secondary | ICD-10-CM

## 2022-09-11 DIAGNOSIS — Z794 Long term (current) use of insulin: Secondary | ICD-10-CM

## 2022-09-11 DIAGNOSIS — N183 Chronic kidney disease, stage 3 unspecified: Secondary | ICD-10-CM

## 2022-09-11 DIAGNOSIS — I7025 Atherosclerosis of native arteries of other extremities with ulceration: Secondary | ICD-10-CM

## 2022-09-11 DIAGNOSIS — E1122 Type 2 diabetes mellitus with diabetic chronic kidney disease: Secondary | ICD-10-CM

## 2022-09-11 NOTE — Progress Notes (Signed)
MRN : [id number]  Beth Mcguire is a 81 y.o. ([birth year]) female who presents with chief complaint of  Chief Complaint  Patient presents with   Follow-up    ultrasound  .  History of Present Illness: Patient returns today in follow up of her PAD.  She has undergone previous revascularization to the lower extremities on multiple occasions in the past.  She had a left lower extremity revascularization earlier this year and had a toe amputation about 4 months ago.  This has healed.  She had 1 subsequent ulceration which quickly healed on that left leg.  She has no new complaints today.  She denies rest pain or ulceration.  She really does not walk much. ABIs today are 0.74 on the right and 0.89 on the left with stable waveforms.  Current Outpatient Medications  Medication Sig Dispense Refill   aspirin 81 MG tablet Take 1 tablet (81 mg total) by mouth daily.     atorvastatin (LIPITOR) 10 MG tablet Take 10 mg every evening by mouth.      clopidogrel (PLAVIX) 75 MG tablet TAKE 1 TABLET BY MOUTH EVERY DAY 90 tablet 1   glucose monitoring kit (FREESTYLE) monitoring kit 1 each by Does not apply route 4 (four) times daily -  before meals and at bedtime. 1 each 0   lisinopril (ZESTRIL) 10 MG tablet Take 10 mg by mouth in the morning.     loratadine (CLARITIN REDITABS) 10 MG dissolvable tablet Take 10 mg by mouth daily as needed for allergies.     metFORMIN (GLUCOPHAGE-XR) 500 MG 24 hr tablet Take 500 mg by mouth daily with supper.  0   NOVOLOG MIX 70/30 FLEXPEN (70-30) 100 UNIT/ML FlexPen Inject 22-28 Units into the skin See admin instructions. Inject 28u under the skin ever morning after breakfast and inject 22u under the skin every evening after supper     QUEtiapine (SEROQUEL) 50 MG tablet Take 50 mg by mouth every evening.     No current facility-administered medications for this visit.    Past Medical History:  Diagnosis Date   Anxiety    Colon cancer ([place])    Dementia ([place]) 2022    Diabetes ([place])    Hypertension    Peripheral vascular disease ([place])     Past Surgical History:  Procedure Laterality Date   AMPUTATION Right 01/14/2016   Procedure: AMPUTATION RAY;  Surgeon: Albertine Patricia, DPM;  Location: [facility];  Service: Podiatry;  Laterality: Right;   AMPUTATION Left 11/26/2021   Procedure: AMPUTATION FIRST RAY;  Surgeon: Sharlotte Alamo, DPM;  Location: [facility];  Service: Podiatry;  Laterality: Left;   AMPUTATION TOE Left 02/11/2022   Procedure: Left Second Toe Amputation;  Surgeon: Samara Deist, DPM;  Location: [facility];  Service: Podiatry;  Laterality: Left;   AMPUTATION TOE Left 05/30/2022   Procedure: AMPUTATION TOE , LEFT THIRD TOE;  Surgeon: Sharlotte Alamo, DPM;  Location: [facility];  Service: Podiatry;  Laterality: Left;   COLON SURGERY     LOWER EXTREMITY ANGIOGRAPHY Left 08/14/2017   Procedure: Lower Extremity Angiography;  Surgeon: Algernon Huxley, MD;  Location: [place] [facility];  Service: Cardiovascular;  Laterality: Left;   LOWER EXTREMITY ANGIOGRAPHY Left 11/06/2021   Procedure: LOWER EXTREMITY ANGIOGRAPHY;  Surgeon: Algernon Huxley, MD;  Location: [place] [facility];  Service: Cardiovascular;  Laterality: Left;   PERIPHERAL VASCULAR CATHETERIZATION N/A 01/05/2016   Procedure: Lower Extremity Angiography;  Surgeon: Algernon Huxley, MD;  Location:  [place] [facility];  Service: Cardiovascular;  Laterality: N/A;   PERIPHERAL VASCULAR CATHETERIZATION  01/05/2016   Procedure: Lower Extremity Intervention;  Surgeon: Algernon Huxley, MD;  Location: [place] [facility];  Service: Cardiovascular;;   PERIPHERAL VASCULAR CATHETERIZATION Right 06/25/2016   Procedure: Lower Extremity Angiography;  Surgeon: Algernon Huxley, MD;  Location: [place] [facility];  Service: Cardiovascular;  Laterality: Right;   PERIPHERAL VASCULAR CATHETERIZATION  06/25/2016   Procedure: Lower Extremity Intervention;  Surgeon: Algernon Huxley, MD;  Location: [place] [facility];  Service: Cardiovascular;;    TONSILLECTOMY      Social History         Tobacco Use   Smoking status: Never   Smokeless tobacco: Never  Substance Use Topics   Alcohol use: No      Alcohol/week: 0.0 standard drinks of alcohol   Drug use: No               Family History  Problem Relation Age of Onset   Diabetes Other    No bleeding or clotting disorders No aneurysms        Allergies  Allergen Reactions   Amoxicillin-Pot Clavulanate Rash   Zosyn [Piperacillin Sod-Tazobactam So] Rash      REVIEW OF SYSTEMS (Negative unless checked)   Constitutional: _0 Weight loss  _1 Fever  _2 Chills Cardiac: _3 Chest pain   _4 Chest pressure   _5 Palpitations   _6 Shortness of breath when laying flat   _7 Shortness of breath at rest   _8 Shortness of breath with exertion. Vascular:  _9 Pain in legs with walking   _10 Pain in legs at rest   _11 Pain in legs when laying flat   _12 Claudication   _13 Pain in feet when walking  _14 Pain in feet at rest  _15 Pain in feet when laying flat   _16 History of DVT   _17 Phlebitis   _18 Swelling in legs   _19 Varicose veins   _20 Non-healing ulcers Pulmonary:   _21 Uses home oxygen   _22 Productive cough   _23 Hemoptysis   _24 Wheeze  _25 COPD   _26 Asthma Neurologic:  _27 Dizziness  _28 Blackouts   _29 Seizures   _30 History of stroke   _31 History of TIA  _32 Aphasia   _33 Temporary blindness   _34 Dysphagia   _35 Weakness or numbness in arms   _36 Weakness or numbness in legs Musculoskeletal:  _37 Arthritis   _38 Joint swelling   _39 Joint pain   _40 Low back pain Hematologic:  _41 Easy bruising  _42 Easy bleeding   _43 Hypercoagulable state   _44 Anemic   Gastrointestinal:  _45 Blood in stool   _46 Vomiting blood  _47 Gastroesophageal reflux/heartburn   _48 Abdominal pain Genitourinary:  _49 Chronic kidney disease   _50 Difficult urination  _51 Frequent urination  _52 Burning with urination   _53 Hematuria Skin:  _54 Rashes   _55 Ulcers   _56 Wounds Psychological:  _57 History of anxiety   _58  History of major depression.    Physical Examination  BP (!) 152/59 (BP  Location: Left Arm)   Pulse 70   Resp 17   Ht _59  (1.575 m)   Wt 165 lb (74.8 kg)   BMI 30.18 kg/m  Gen:  WD/WN, NAD Head: /AT, No temporalis wasting. Ear/Nose/Throat: Hearing grossly intact, nares w/o erythema or drainage Eyes: Conjunctiva clear. Sclera non-icteric Neck: Supple.  Trachea midline Pulmonary:  Good air movement, no use of accessory muscles.  Cardiac: irregular Vascular:  Vessel Right Left  Radial Palpable Palpable                          PT 1+ Palpable  1+ Palpable  DP 1+ Palpable 1+ Palpable    Musculoskeletal: M/S 5/5 throughout.  No deformity or atrophy. No ulcerations. Trace LE edema. Neurologic: Sensation grossly intact in extremities.  Symmetrical.  Speech is limited Psychiatric: Judgment and insight are poor.  Not a good historian Dermatologic: No rashes or ulcers noted.  No cellulitis or open wounds.      Labs Recent Results (from the past 2160 hour(s))  CBC with Differential     Status: Abnormal   Collection Time: 07/12/22  1:44 PM  Result Value Ref Range   WBC 16.4 (H) 4.0 - 10.5 K/uL   RBC 3.48 (L) 3.87 - 5.11 MIL/uL   Hemoglobin 10.3 (L) 12.0 - 15.0 g/dL   HCT 32.1 (L) 36.0 - 46.0 %   MCV 92.2 80.0 - 100.0 fL   MCH 29.6 26.0 - 34.0 pg   MCHC 32.1 30.0 - 36.0 g/dL   RDW 14.6 11.5 - 15.5 %   Platelets 419 (H) 150 - 400 K/uL   nRBC 0.0 0.0 - 0.2 %   Neutrophils Relative % 86 %   Neutro Abs 14.1 (H) 1.7 - 7.7 K/uL   Lymphocytes Relative 9 %   Lymphs Abs 1.5 0.7 - 4.0 K/uL   Monocytes Relative 4 %   Monocytes Absolute 0.6 0.1 - 1.0 K/uL   Eosinophils Relative 0 %   Eosinophils Absolute 0.1 0.0 - 0.5 K/uL   Basophils Relative 0 %   Basophils Absolute 0.1 0.0 - 0.1 K/uL   Immature Granulocytes 1 %   Abs Immature Granulocytes 0.09 (H) 0.00 - 0.07 K/uL    Comment: Performed at [facility] [address] [place], [place] [address]  Comprehensive metabolic panel     Status: Abnormal   Collection Time: 07/12/22  1:44 PM   Result Value Ref Range   Sodium 136 135 - 145 mmol/L   Potassium 4.9 3.5 - 5.1 mmol/L   Chloride 107 98 - 111 mmol/L   CO2 21 (L) 22 - 32 mmol/L   Glucose, Bld 282 (H) 70 - 99 mg/dL    Comment: Glucose reference range applies only to samples taken after fasting for at least 8 hours.   BUN 22 8 - 23 mg/dL   Creatinine, Ser 1.01 (H) 0.44 - 1.00 mg/dL   Calcium 8.9 8.9 - 10.3 mg/dL   Total Protein 6.6 6.5 - 8.1 g/dL   Albumin 3.1 (L) 3.5 - 5.0 g/dL   AST 18 15 - 41 U/L   ALT 10 0 - 44 U/L   Alkaline Phosphatase 150 (H) 38 - 126 U/L   Total Bilirubin 0.5 0.3 - 1.2 mg/dL   GFR, Estimated 55 (L) >60 mL/min    Comment: (NOTE) Calculated using the CKD-EPI Creatinine Equation (2021)    Anion gap 8 5 - 15    Comment: Performed at [facility], [address] [place], [place] [address]  Lipase, blood     Status: None   Collection Time: 07/12/22  1:44 PM  Result Value Ref Range   Lipase 21 11 - 51 U/L    Comment: Performed at [facility], [place], [place] [address]  Troponin I (High Sensitivity)     Status: None   Collection Time: 07/12/22  1:44 PM  Result Value Ref Range   Troponin I (High Sensitivity) 6 <18 ng/L    Comment: (NOTE) Elevated high sensitivity troponin I (hsTnI) values and significant  changes across serial measurements may  suggest ACS but many other  chronic and acute conditions are known to elevate hsTnI results.  Refer to the "Links" section for chest pain algorithms and additional  guidance. Performed at [facility], [address] [place], [place] [address]   Urinalysis, Routine w reflex microscopic     Status: Abnormal   Collection Time: 07/12/22  5:08 PM  Result Value Ref Range   Color, Urine YELLOW (A) YELLOW   APPearance HAZY (A) CLEAR   Specific Gravity, Urine 1.016 1.005 - 1.030   pH 8.0 5.0 - 8.0   Glucose, UA 50 (A) NEGATIVE mg/dL   Hgb urine dipstick NEGATIVE NEGATIVE   Bilirubin Urine NEGATIVE  NEGATIVE   Ketones, ur NEGATIVE NEGATIVE mg/dL   Protein, ur NEGATIVE NEGATIVE mg/dL   Nitrite NEGATIVE NEGATIVE   Leukocytes,Ua LARGE (A) NEGATIVE   RBC / HPF 0-5 0 - 5 RBC/hpf   WBC, UA >50 (H) 0 - 5 WBC/hpf   Bacteria, UA NONE SEEN NONE SEEN   Squamous Epithelial / LPF 0-5 0 - 5    Comment: Performed at [facility], [address] [place], [place] [address]  CBG monitoring, ED     Status: Abnormal   Collection Time: 07/12/22 11:32 PM  Result Value Ref Range   Glucose-Capillary 124 (H) 70 - 99 mg/dL    Comment: Glucose reference range applies only to samples taken after fasting for at least 8 hours.  Troponin I (High Sensitivity)     Status: None   Collection Time: 07/12/22 11:36 PM  Result Value Ref Range   Troponin I (High Sensitivity) 14 <18 ng/L    Comment: (NOTE) Elevated high sensitivity troponin I (hsTnI) values and significant  changes across serial measurements may suggest ACS but many other  chronic and acute conditions are known to elevate hsTnI results.  Refer to the "Links" section for chest pain algorithms and additional  guidance. Performed at [facility], [address], [place], [place] [address]   Urine Culture     Status: Abnormal   Collection Time: 07/12/22 11:36 PM   Specimen: Urine, Clean Catch  Result Value Ref Range   Specimen Description      URINE, CLEAN CATCH Performed at [facility], [address], [place], [place] [address]    Special Requests      NONE Performed at [facility], [place], [place] [address]    Culture (A)     <10,000 COLONIES/mL INSIGNIFICANT GROWTH Performed at [facility] [address], [place], [place] [address]    Report Status 07/14/2022 FINAL   CBC     Status: Abnormal   Collection Time: 07/12/22 11:39 PM  Result Value Ref Range   WBC 16.2 (H) 4.0 - 10.5 K/uL   RBC 3.64 (L) 3.87 - 5.11 MIL/uL   Hemoglobin 10.4 (L) 12.0 - 15.0 g/dL   HCT 33.3 (L)  36.0 - 46.0 %   MCV 91.5 80.0 - 100.0 fL   MCH 28.6 26.0 - 34.0 pg   MCHC 31.2 30.0 - 36.0 g/dL   RDW 14.5 11.5 - 15.5 %   Platelets 403 (H) 150 - 400 K/uL   nRBC 0.0 0.0 - 0.2 %    Comment: Performed at [facility], [address] [place], [place] [address]  Comprehensive metabolic panel     Status: Abnormal   Collection Time: 07/12/22 11:39 PM  Result Value Ref Range   Sodium 140 135 -  145 mmol/L   Potassium 4.9 3.5 - 5.1 mmol/L   Chloride 110 98 - 111 mmol/L   CO2 25 22 - 32 mmol/L   Glucose, Bld 133 (H) 70 - 99 mg/dL    Comment: Glucose reference range applies only to samples taken after fasting for at least 8 hours.   BUN 20 8 - 23 mg/dL   Creatinine, Ser 1.08 (H) 0.44 - 1.00 mg/dL   Calcium 9.1 8.9 - 10.3 mg/dL   Total Protein 6.2 (L) 6.5 - 8.1 g/dL   Albumin 3.0 (L) 3.5 - 5.0 g/dL   AST 20 15 - 41 U/L   ALT 13 0 - 44 U/L   Alkaline Phosphatase 138 (H) 38 - 126 U/L   Total Bilirubin 0.3 0.3 - 1.2 mg/dL   GFR, Estimated 51 (L) >60 mL/min    Comment: (NOTE) Calculated using the CKD-EPI Creatinine Equation (2021)    Anion gap 5 5 - 15    Comment: Performed at [facility], [address] [place], [place] [address]  CBG monitoring, ED     Status: Abnormal   Collection Time: 07/13/22  7:50 AM  Result Value Ref Range   Glucose-Capillary 107 (H) 70 - 99 mg/dL    Comment: Glucose reference range applies only to samples taken after fasting for at least 8 hours.   Comment 1 Notify RN   CBG monitoring, ED     Status: Abnormal   Collection Time: 07/13/22 11:59 AM  Result Value Ref Range   Glucose-Capillary 175 (H) 70 - 99 mg/dL    Comment: Glucose reference range applies only to samples taken after fasting for at least 8 hours.  Glucose, capillary     Status: Abnormal   Collection Time: 07/13/22  4:17 PM  Result Value Ref Range   Glucose-Capillary 152 (H) 70 - 99 mg/dL    Comment: Glucose reference range applies only to samples taken after fasting for  at least 8 hours.  Glucose, capillary     Status: Abnormal   Collection Time: 07/13/22  9:04 PM  Result Value Ref Range   Glucose-Capillary 261 (H) 70 - 99 mg/dL    Comment: Glucose reference range applies only to samples taken after fasting for at least 8 hours.  CBC     Status: Abnormal   Collection Time: 07/14/22  6:15 AM  Result Value Ref Range   WBC 9.7 4.0 - 10.5 K/uL   RBC 3.07 (L) 3.87 - 5.11 MIL/uL   Hemoglobin 9.1 (L) 12.0 - 15.0 g/dL   HCT 27.8 (L) 36.0 - 46.0 %   MCV 90.6 80.0 - 100.0 fL   MCH 29.6 26.0 - 34.0 pg   MCHC 32.7 30.0 - 36.0 g/dL   RDW 14.6 11.5 - 15.5 %   Platelets 330 150 - 400 K/uL   nRBC 0.0 0.0 - 0.2 %    Comment: Performed at [facility], [address] [place], [place] [address]  Comprehensive metabolic panel     Status: Abnormal   Collection Time: 07/14/22  6:15 AM  Result Value Ref Range   Sodium 136 135 - 145 mmol/L    Comment: ELECTROLYTES REPEATED TO VERIFY.PMF   Potassium 4.2 3.5 - 5.1 mmol/L   Chloride 111 98 - 111 mmol/L   CO2 24 22 - 32 mmol/L   Glucose, Bld 172 (H) 70 - 99 mg/dL    Comment: Glucose reference range applies only to samples taken after fasting for at least 8  hours.   BUN 19 8 - 23 mg/dL   Creatinine, Ser 1.09 (H) 0.44 - 1.00 mg/dL   Calcium 8.1 (L) 8.9 - 10.3 mg/dL   Total Protein 5.3 (L) 6.5 - 8.1 g/dL   Albumin 2.5 (L) 3.5 - 5.0 g/dL   AST 10 (L) 15 - 41 U/L   ALT 10 0 - 44 U/L   Alkaline Phosphatase 123 38 - 126 U/L   Total Bilirubin 0.3 0.3 - 1.2 mg/dL   GFR, Estimated 50 (L) >60 mL/min    Comment: (NOTE) Calculated using the CKD-EPI Creatinine Equation (2021)    Anion gap 1 (L) 5 - 15    Comment: Performed at [facility], [address] [place], [place] [address]  Glucose, capillary     Status: Abnormal   Collection Time: 07/14/22  8:10 AM  Result Value Ref Range   Glucose-Capillary 143 (H) 70 - 99 mg/dL    Comment: Glucose reference range applies only to samples taken after fasting  for at least 8 hours.  Comprehensive metabolic panel     Status: Abnormal   Collection Time: 07/21/22  1:27 PM  Result Value Ref Range   Sodium 135 135 - 145 mmol/L   Potassium 5.1 3.5 - 5.1 mmol/L   Chloride 107 98 - 111 mmol/L   CO2 23 22 - 32 mmol/L   Glucose, Bld 220 (H) 70 - 99 mg/dL    Comment: Glucose reference range applies only to samples taken after fasting for at least 8 hours.   BUN 17 8 - 23 mg/dL   Creatinine, Ser 1.15 (H) 0.44 - 1.00 mg/dL   Calcium 8.7 (L) 8.9 - 10.3 mg/dL   Total Protein 7.1 6.5 - 8.1 g/dL   Albumin 3.4 (L) 3.5 - 5.0 g/dL   AST 17 15 - 41 U/L   ALT 14 0 - 44 U/L   Alkaline Phosphatase 147 (H) 38 - 126 U/L   Total Bilirubin 0.3 0.3 - 1.2 mg/dL   GFR, Estimated 47 (L) >60 mL/min    Comment: (NOTE) Calculated using the CKD-EPI Creatinine Equation (2021)    Anion gap 5 5 - 15    Comment: Performed at [facility], [address] [place], [place] [address]  CBC with Differential     Status: Abnormal   Collection Time: 07/21/22  1:27 PM  Result Value Ref Range   WBC 11.0 (H) 4.0 - 10.5 K/uL   RBC 3.86 (L) 3.87 - 5.11 MIL/uL   Hemoglobin 11.2 (L) 12.0 - 15.0 g/dL   HCT 35.7 (L) 36.0 - 46.0 %   MCV 92.5 80.0 - 100.0 fL   MCH 29.0 26.0 - 34.0 pg   MCHC 31.4 30.0 - 36.0 g/dL   RDW 13.7 11.5 - 15.5 %   Platelets 519 (H) 150 - 400 K/uL   nRBC 0.0 0.0 - 0.2 %   Neutrophils Relative % 62 %   Neutro Abs 6.9 1.7 - 7.7 K/uL   Lymphocytes Relative 30 %   Lymphs Abs 3.3 0.7 - 4.0 K/uL   Monocytes Relative 4 %   Monocytes Absolute 0.4 0.1 - 1.0 K/uL   Eosinophils Relative 2 %   Eosinophils Absolute 0.2 0.0 - 0.5 K/uL   Basophils Relative 1 %   Basophils Absolute 0.1 0.0 - 0.1 K/uL   Immature Granulocytes 1 %   Abs Immature Granulocytes 0.08 (H) 0.00 - 0.07 K/uL    Comment: Performed at [facility], [address] [place], [place] [address]  Resp Panel by RT-PCR (Flu A&B, Covid) Anterior Nasal Swab     Status: None   Collection  Time: 07/21/22  1:27 PM   Specimen: Anterior Nasal Swab  Result Value Ref Range   SARS Coronavirus 2 by RT PCR NEGATIVE NEGATIVE    Comment: (NOTE) SARS-CoV-2 target nucleic acids are NOT DETECTED.  The SARS-CoV-2 RNA is generally detectable in upper respiratory specimens during the acute phase of infection. The lowest concentration of SARS-CoV-2 viral copies this assay can detect is 138 copies/mL. A negative result does not preclude SARS-Cov-2 infection and should not be used as the sole basis for treatment or other patient management decisions. A negative result may occur with  improper specimen collection/handling, submission of specimen other than nasopharyngeal swab, presence of viral mutation(s) within the areas targeted by this assay, and inadequate number of viral copies(<138 copies/mL). A negative result must be combined with clinical observations, patient history, and epidemiological information. The expected result is Negative.  Fact Sheet for Patients:  EntrepreneurPulse.com.au  Fact Sheet for Healthcare Providers:  IncredibleEmployment.be  This test is no t yet approved or cleared by the [place] FDA and  has been authorized for detection and/or diagnosis of SARS-CoV-2 by FDA under an Emergency Use Authorization (EUA). This EUA will remain  in effect (meaning this test can be used) for the duration of the COVID-19 declaration under Section 564(b)(1) of the Act, 21 U.S.C.section 360bbb-3(b)(1), unless the authorization is terminated  or revoked sooner.       Influenza A by PCR NEGATIVE NEGATIVE   Influenza B by PCR NEGATIVE NEGATIVE    Comment: (NOTE) The Xpert Xpress SARS-CoV-2/FLU/RSV plus assay is intended as an aid in the diagnosis of influenza from Nasopharyngeal swab specimens and should not be used as a sole basis for treatment. Nasal washings and aspirates are unacceptable for Xpert Xpress  SARS-CoV-2/FLU/RSV testing.  Fact Sheet for Patients: EntrepreneurPulse.com.au  Fact Sheet for Healthcare Providers: IncredibleEmployment.be  This test is not yet approved or cleared by the [place] FDA and has been authorized for detection and/or diagnosis of SARS-CoV-2 by FDA under an Emergency Use Authorization (EUA). This EUA will remain in effect (meaning this test can be used) for the duration of the COVID-19 declaration under Section 564(b)(1) of the Act, 21 U.S.C. section 360bbb-3(b)(1), unless the authorization is terminated or revoked.  Performed at [facility], [place], [place] [address]   Troponin I (High Sensitivity)     Status: None   Collection Time: 07/21/22  1:27 PM  Result Value Ref Range   Troponin I (High Sensitivity) 6 <18 ng/L    Comment: (NOTE) Elevated high sensitivity troponin I (hsTnI) values and significant  changes across serial measurements may suggest ACS but many other  chronic and acute conditions are known to elevate hsTnI results.  Refer to the "Links" section for chest pain algorithms and additional  guidance. Performed at [facility], [address] [place], [place] [address]   Beta-hydroxybutyric acid     Status: None   Collection Time: 07/21/22  1:27 PM  Result Value Ref Range   Beta-Hydroxybutyric Acid 0.10 0.05 - 0.27 mmol/L    Comment: Performed at [facility], [address] [place], [place] [address]  Brain natriuretic peptide     Status: None   Collection Time: 07/21/22  1:27 PM  Result Value Ref Range   B Natriuretic Peptide 34.1 0.0 - 100.0 pg/mL  Comment: Performed at [facility], [place], [place] [address]  Troponin I (High Sensitivity)     Status: None   Collection Time: 07/21/22  6:34 PM  Result Value Ref Range   Troponin I (High Sensitivity) 6 <18 ng/L    Comment: (NOTE) Elevated high sensitivity  troponin I (hsTnI) values and significant  changes across serial measurements may suggest ACS but many other  chronic and acute conditions are known to elevate hsTnI results.  Refer to the "Links" section for chest pain algorithms and additional  guidance. Performed at [facility], [address] [place], [place] [address]   Urinalysis, Routine w reflex microscopic Urine, Clean Catch     Status: Abnormal   Collection Time: 07/21/22  9:04 PM  Result Value Ref Range   Color, Urine STRAW (A) YELLOW   APPearance CLEAR (A) CLEAR   Specific Gravity, Urine 1.011 1.005 - 1.030   pH 9.0 (H) 5.0 - 8.0   Glucose, UA NEGATIVE NEGATIVE mg/dL   Hgb urine dipstick SMALL (A) NEGATIVE   Bilirubin Urine NEGATIVE NEGATIVE   Ketones, ur NEGATIVE NEGATIVE mg/dL   Protein, ur NEGATIVE NEGATIVE mg/dL   Nitrite NEGATIVE NEGATIVE   Leukocytes,Ua NEGATIVE NEGATIVE   RBC / HPF 0-5 0 - 5 RBC/hpf   WBC, UA 0-5 0 - 5 WBC/hpf   Bacteria, UA RARE (A) NONE SEEN   Squamous Epithelial / LPF NONE SEEN 0 - 5    Comment: Performed at [facility], [address], [place], [place] [address]  BLADDER SCAN AMB NON-IMAGING     Status: None   Collection Time: 08/02/22 11:40 AM  Result Value Ref Range   Scan Result 80m     Radiology No results found.  Assessment/Plan Hypertension blood pressure control important in reducing the progression of atherosclerotic disease. On appropriate oral medications.     Diabetes (HMineral blood glucose control important in reducing the progression of atherosclerotic disease. Also, involved in wound healing. On appropriate medications.     Hyperlipidemia lipid control important in reducing the progression of atherosclerotic disease. Continue statin therapy  Atherosclerosis of native arteries of the extremities with ulceration (HCC) ABIs today are 0.74 on the right and 0.89 on the left with stable waveforms.  No current symptoms or problems.  Continue current  medical regimen.  Recheck in 6 months.    JLeotis Pain MD  09/11/2022 3:07 PM    This note was created with Dragon medical transcription system.  Any errors from dictation are purely unintentional

## 2022-09-11 NOTE — Assessment & Plan Note (Signed)
ABIs today are 0.74 on the right and 0.89 on the left with stable waveforms.  No current symptoms or problems.  Continue current medical regimen.  Recheck in 6 months.

## 2022-10-19 ENCOUNTER — Emergency Department
Admission: EM | Admit: 2022-10-19 | Discharge: 2022-10-19 | Disposition: A | Discharge status: Home / Self Care | Payer: Medicare HMO | Attending: Emergency Medicine | Admitting: Emergency Medicine

## 2022-10-19 ENCOUNTER — Other Ambulatory Visit: Payer: Self-pay

## 2022-10-19 ENCOUNTER — Emergency Department: Payer: Medicare HMO

## 2022-10-19 DIAGNOSIS — I82442 Acute embolism and thrombosis of left tibial vein: Secondary | ICD-10-CM | POA: Diagnosis not present

## 2022-10-19 DIAGNOSIS — M79662 Pain in left lower leg: Secondary | ICD-10-CM | POA: Diagnosis present

## 2022-10-19 DIAGNOSIS — E119 Type 2 diabetes mellitus without complications: Secondary | ICD-10-CM | POA: Diagnosis not present

## 2022-10-19 NOTE — ED Triage Notes (Signed)
Pt comes with c/o left leg pain. Pt states this all started today. Pt does have stents in legs. Pt has swelling noted to legs. Pt states that is normal.   Pt is A*OX4.  BP-206/83 HR-76 CBG-113 O2-99% RA

## 2022-10-19 NOTE — ED Provider Notes (Signed)
[facility] Provider Note    Event Date/Time   First MD Initiated Contact with Patient 10/19/22 [phone]     (approximate)   History   Leg Pain   HPI  Beth Mcguire is a 82 y.o. female with a history of diabetes, peripheral arterial disease with reported stents in her lower extremity who presents with complaints of left-sided calf pain.  Patient reports this morning when she went to get out of bed her left calf was hurting whenever she moved it but it felt fine when she held it still.  She reports the pain is resolved and now she feels fine     Physical Exam   Triage Vital Signs: ED Triage Vitals  Enc Vitals Group     BP 10/19/22 0936 (!) 188/54     Pulse Rate 10/19/22 0936 76     Resp 10/19/22 0936 17     Temp 10/19/22 0936 97.7 F (36.5 C)     Temp Source 10/19/22 0936 Oral     SpO2 10/19/22 0936 100 %     Weight 10/19/22 0936 74.8 kg (165 lb)     Height 10/19/22 0936 1.575 m ('5\' 2"'$ )     Head Circumference --      Peak Flow --      Pain Score 10/19/22 0927 3     Pain Loc --      Pain Edu? --      Excl. in [place]? --     Most recent vital signs: Vitals:   10/19/22 1130 10/19/22 1153  BP: (!) 180/59 (!) 168/60  Pulse: 72   Resp: 13   Temp:    SpO2: 100%      General: Awake, no distress.  CV:  Good peripheral perfusion.  Resp:  Normal effort.  Abd:  No distention.  Other:  Left lower extremity: No calf tenderness, no swelling, warm and well-perfused distally, evidence of prior amputations noted, normal pulses  ED Results / Procedures / Treatments   Labs (all labs ordered are listed, but only abnormal results are displayed) Labs Reviewed - No data to display   EKG     RADIOLOGY Ultrasound left lower extremity    PROCEDURES:  Critical Care performed:   Procedures   MEDICATIONS ORDERED IN ED: Medications - No data to display   IMPRESSION / MDM / [place] / ED COURSE  I reviewed the triage vital signs and  the nursing notes. Patient's presentation is most consistent with acute presentation with potential threat to life or bodily function.  Patient presents with left leg pain as above.  Differential includes peripheral arterial disease, ischemia, DVT, musculoskeletal pain/cramping  Exam is overall reassuring, warm and well-perfused, symptoms have resolved without any intervention prior to arrival.  Pain apparently was primarily in the back of the calf, feels well now, will send for DVT study  Ultrasound demonstrates possible tibial vein DVT however is unclear.  Discussed with family, would not start any anticoagulation at this time, she is on Plavix on an aspirin, will have her follow-up with Dr. Lucky Cowboy, her vascular surgeon for further evaluation      FINAL CLINICAL IMPRESSION(S) / ED DIAGNOSES   Final diagnoses:  Acute deep vein thrombosis (DVT) of left tibial vein ([place])     Rx / DC Orders   ED Discharge Orders     None        Note:  This document was prepared using Dragon voice recognition  software and may include unintentional dictation errors.   Lavonia Drafts, MD 10/19/22 [phone]

## 2022-10-22 ENCOUNTER — Ambulatory Visit (INDEPENDENT_AMBULATORY_CARE_PROVIDER_SITE_OTHER): Payer: Medicare HMO | Admitting: Nurse Practitioner

## 2022-10-22 ENCOUNTER — Encounter (INDEPENDENT_AMBULATORY_CARE_PROVIDER_SITE_OTHER): Payer: Self-pay | Admitting: Nurse Practitioner

## 2022-10-22 VITALS — BP 113/71 | HR 78 | Resp 15

## 2022-10-22 DIAGNOSIS — I739 Peripheral vascular disease, unspecified: Secondary | ICD-10-CM | POA: Diagnosis not present

## 2022-10-22 DIAGNOSIS — I82442 Acute embolism and thrombosis of left tibial vein: Secondary | ICD-10-CM | POA: Diagnosis not present

## 2022-10-22 DIAGNOSIS — Z794 Long term (current) use of insulin: Secondary | ICD-10-CM

## 2022-10-22 DIAGNOSIS — E1122 Type 2 diabetes mellitus with diabetic chronic kidney disease: Secondary | ICD-10-CM | POA: Diagnosis not present

## 2022-10-22 DIAGNOSIS — N183 Chronic kidney disease, stage 3 unspecified: Secondary | ICD-10-CM

## 2022-10-22 MED ORDER — TRAMADOL HCL 50 MG PO TABS: 50.0000 mg | ORAL_TABLET | Freq: Four times a day (QID) | ORAL | 0 refills | Status: DC | PRN

## 2022-10-26 ENCOUNTER — Encounter: Payer: Self-pay | Admitting: Internal Medicine

## 2022-10-26 ENCOUNTER — Emergency Department: Payer: Medicare HMO

## 2022-10-26 ENCOUNTER — Encounter: Payer: Self-pay | Admitting: Emergency Medicine

## 2022-10-26 ENCOUNTER — Encounter
Admission: EM | Disposition: A | Discharge status: Hospice | Payer: Self-pay | Source: Home / Self Care | Attending: Internal Medicine

## 2022-10-26 ENCOUNTER — Inpatient Hospital Stay
Admission: EM | Admit: 2022-10-26 | Discharge: 2022-11-02 | DRG: 270 | Disposition: A | Discharge status: Hospice | Payer: Medicare HMO | Attending: Internal Medicine | Admitting: Internal Medicine

## 2022-10-26 ENCOUNTER — Other Ambulatory Visit: Payer: Self-pay

## 2022-10-26 ENCOUNTER — Encounter (INDEPENDENT_AMBULATORY_CARE_PROVIDER_SITE_OTHER): Payer: Self-pay | Admitting: Nurse Practitioner

## 2022-10-26 DIAGNOSIS — Z880 Allergy status to penicillin: Secondary | ICD-10-CM

## 2022-10-26 DIAGNOSIS — Z66 Do not resuscitate: Secondary | ICD-10-CM | POA: Diagnosis present

## 2022-10-26 DIAGNOSIS — E1165 Type 2 diabetes mellitus with hyperglycemia: Secondary | ICD-10-CM | POA: Diagnosis present

## 2022-10-26 DIAGNOSIS — Z9181 History of falling: Secondary | ICD-10-CM

## 2022-10-26 DIAGNOSIS — Z89422 Acquired absence of other left toe(s): Secondary | ICD-10-CM

## 2022-10-26 DIAGNOSIS — I70222 Atherosclerosis of native arteries of extremities with rest pain, left leg: Secondary | ICD-10-CM | POA: Diagnosis present

## 2022-10-26 DIAGNOSIS — J9601 Acute respiratory failure with hypoxia: Secondary | ICD-10-CM

## 2022-10-26 DIAGNOSIS — J181 Lobar pneumonia, unspecified organism: Secondary | ICD-10-CM | POA: Diagnosis not present

## 2022-10-26 DIAGNOSIS — E11621 Type 2 diabetes mellitus with foot ulcer: Secondary | ICD-10-CM | POA: Diagnosis present

## 2022-10-26 DIAGNOSIS — E1151 Type 2 diabetes mellitus with diabetic peripheral angiopathy without gangrene: Secondary | ICD-10-CM | POA: Diagnosis present

## 2022-10-26 DIAGNOSIS — N1831 Chronic kidney disease, stage 3a: Secondary | ICD-10-CM | POA: Diagnosis present

## 2022-10-26 DIAGNOSIS — Z89421 Acquired absence of other right toe(s): Secondary | ICD-10-CM

## 2022-10-26 DIAGNOSIS — D649 Anemia, unspecified: Secondary | ICD-10-CM | POA: Diagnosis not present

## 2022-10-26 DIAGNOSIS — I10 Essential (primary) hypertension: Secondary | ICD-10-CM | POA: Diagnosis present

## 2022-10-26 DIAGNOSIS — I129 Hypertensive chronic kidney disease with stage 1 through stage 4 chronic kidney disease, or unspecified chronic kidney disease: Secondary | ICD-10-CM | POA: Diagnosis present

## 2022-10-26 DIAGNOSIS — N189 Chronic kidney disease, unspecified: Secondary | ICD-10-CM | POA: Diagnosis present

## 2022-10-26 DIAGNOSIS — Z833 Family history of diabetes mellitus: Secondary | ICD-10-CM

## 2022-10-26 DIAGNOSIS — M79605 Pain in left leg: Secondary | ICD-10-CM | POA: Diagnosis present

## 2022-10-26 DIAGNOSIS — I739 Peripheral vascular disease, unspecified: Secondary | ICD-10-CM | POA: Diagnosis not present

## 2022-10-26 DIAGNOSIS — R531 Weakness: Secondary | ICD-10-CM | POA: Diagnosis not present

## 2022-10-26 DIAGNOSIS — Z515 Encounter for palliative care: Secondary | ICD-10-CM | POA: Diagnosis not present

## 2022-10-26 DIAGNOSIS — Z86718 Personal history of other venous thrombosis and embolism: Secondary | ICD-10-CM | POA: Diagnosis not present

## 2022-10-26 DIAGNOSIS — D631 Anemia in chronic kidney disease: Secondary | ICD-10-CM | POA: Diagnosis present

## 2022-10-26 DIAGNOSIS — I70245 Atherosclerosis of native arteries of left leg with ulceration of other part of foot: Secondary | ICD-10-CM | POA: Diagnosis not present

## 2022-10-26 DIAGNOSIS — Z794 Long term (current) use of insulin: Secondary | ICD-10-CM | POA: Diagnosis not present

## 2022-10-26 DIAGNOSIS — F0390 Unspecified dementia without behavioral disturbance: Secondary | ICD-10-CM | POA: Diagnosis present

## 2022-10-26 DIAGNOSIS — Z7902 Long term (current) use of antithrombotics/antiplatelets: Secondary | ICD-10-CM

## 2022-10-26 DIAGNOSIS — E1122 Type 2 diabetes mellitus with diabetic chronic kidney disease: Secondary | ICD-10-CM

## 2022-10-26 DIAGNOSIS — Z85038 Personal history of other malignant neoplasm of large intestine: Secondary | ICD-10-CM

## 2022-10-26 DIAGNOSIS — R000 Tachycardia, unspecified: Secondary | ICD-10-CM | POA: Diagnosis not present

## 2022-10-26 DIAGNOSIS — I743 Embolism and thrombosis of arteries of the lower extremities: Secondary | ICD-10-CM | POA: Diagnosis not present

## 2022-10-26 DIAGNOSIS — Z7984 Long term (current) use of oral hypoglycemic drugs: Secondary | ICD-10-CM

## 2022-10-26 DIAGNOSIS — Z888 Allergy status to other drugs, medicaments and biological substances status: Secondary | ICD-10-CM

## 2022-10-26 DIAGNOSIS — L97529 Non-pressure chronic ulcer of other part of left foot with unspecified severity: Secondary | ICD-10-CM | POA: Diagnosis present

## 2022-10-26 DIAGNOSIS — Z79899 Other long term (current) drug therapy: Secondary | ICD-10-CM

## 2022-10-26 DIAGNOSIS — Z7982 Long term (current) use of aspirin: Secondary | ICD-10-CM

## 2022-10-26 DIAGNOSIS — L89151 Pressure ulcer of sacral region, stage 1: Secondary | ICD-10-CM | POA: Insufficient documentation

## 2022-10-26 DIAGNOSIS — Z7901 Long term (current) use of anticoagulants: Secondary | ICD-10-CM | POA: Diagnosis not present

## 2022-10-26 DIAGNOSIS — I700 Atherosclerosis of aorta: Secondary | ICD-10-CM

## 2022-10-26 DIAGNOSIS — F419 Anxiety disorder, unspecified: Secondary | ICD-10-CM | POA: Diagnosis present

## 2022-10-26 DIAGNOSIS — Z7189 Other specified counseling: Secondary | ICD-10-CM | POA: Diagnosis not present

## 2022-10-26 DIAGNOSIS — N179 Acute kidney failure, unspecified: Secondary | ICD-10-CM

## 2022-10-26 HISTORY — PX: LOWER EXTREMITY ANGIOGRAPHY: CATH118251

## 2022-10-26 LAB — BASIC METABOLIC PANEL
Anion gap: 12 (ref 5–15)
BUN: 35 mg/dL — ABNORMAL HIGH (ref 8–23)
CO2: 22 mmol/L (ref 22–32)
Calcium: 9 mg/dL (ref 8.9–10.3)
Chloride: 104 mmol/L (ref 98–111)
Creatinine, Ser: 1.53 mg/dL — ABNORMAL HIGH (ref 0.44–1.00)
GFR, Estimated: 34 mL/min — ABNORMAL LOW (ref >60)
Glucose, Bld: 262 mg/dL — ABNORMAL HIGH (ref 70–99)
Potassium: 4.8 mmol/L (ref 3.5–5.1)
Sodium: 138 mmol/L (ref 135–145)

## 2022-10-26 LAB — CBC WITH DIFFERENTIAL/PLATELET
Abs Immature Granulocytes: 0.08 10*3/uL — ABNORMAL HIGH (ref 0.00–0.07)
Basophils Absolute: 0.1 10*3/uL (ref 0.0–0.1)
Basophils Relative: 1 %
Eosinophils Absolute: 0.1 10*3/uL (ref 0.0–0.5)
Eosinophils Relative: 1 %
HCT: 38.6 % (ref 36.0–46.0)
Hemoglobin: 12.3 g/dL (ref 12.0–15.0)
Immature Granulocytes: 1 %
Lymphocytes Relative: 8 %
Lymphs Abs: 1.2 10*3/uL (ref 0.7–4.0)
MCH: 28.5 pg (ref 26.0–34.0)
MCHC: 31.9 g/dL (ref 30.0–36.0)
MCV: 89.4 fL (ref 80.0–100.0)
Monocytes Absolute: 0.7 10*3/uL (ref 0.1–1.0)
Monocytes Relative: 5 %
Neutro Abs: 11.9 10*3/uL — ABNORMAL HIGH (ref 1.7–7.7)
Neutrophils Relative %: 84 %
Platelets: 403 10*3/uL — ABNORMAL HIGH (ref 150–400)
RBC: 4.32 MIL/uL (ref 3.87–5.11)
RDW: 13.2 % (ref 11.5–15.5)
WBC: 14 10*3/uL — ABNORMAL HIGH (ref 4.0–10.5)
nRBC: 0 % (ref 0.0–0.2)

## 2022-10-26 LAB — LACTIC ACID, PLASMA
Lactic Acid, Venous: 1.6 mmol/L (ref 0.5–1.9)
Lactic Acid, Venous: 1.5 mmol/L (ref 0.5–1.9)

## 2022-10-26 LAB — GLUCOSE, CAPILLARY
Glucose-Capillary: 250 mg/dL — ABNORMAL HIGH (ref 70–99)
Glucose-Capillary: 242 mg/dL — ABNORMAL HIGH (ref 70–99)

## 2022-10-26 LAB — PROTIME-INR
INR: 1.2 (ref 0.8–1.2)
Prothrombin Time: 14.9 seconds (ref 11.4–15.2)

## 2022-10-26 LAB — APTT: aPTT: 109 seconds — ABNORMAL HIGH (ref 24–36)

## 2022-10-26 SURGERY — LOWER EXTREMITY ANGIOGRAPHY
Anesthesia: Moderate Sedation | Laterality: Left

## 2022-10-26 MED ORDER — MIDAZOLAM HCL 2 MG/ML PO SYRP
8.0000 mg | ORAL_SOLUTION | Freq: Once | ORAL | Status: DC | PRN
  Filled 2022-10-26: qty 5

## 2022-10-26 MED ORDER — HYDRALAZINE HCL 20 MG/ML IJ SOLN
INTRAMUSCULAR | Status: DC | PRN
  Administered 2022-10-26 (×2): 10 mg via INTRAVENOUS

## 2022-10-26 MED ORDER — HEPARIN BOLUS VIA INFUSION
5000.0000 [IU] | Freq: Once | INTRAVENOUS | Status: AC
  Administered 2022-10-26: 5000 [IU] via INTRAVENOUS
  Filled 2022-10-26: qty 5000

## 2022-10-26 MED ORDER — TIROFIBAN (AGGRASTAT) BOLUS VIA INFUSION
25.0000 ug/kg | Freq: Once | INTRAVENOUS | Status: AC
  Filled 2022-10-26: qty 38

## 2022-10-26 MED ORDER — DIVALPROEX SODIUM ER 250 MG PO TB24
250.0000 mg | ORAL_TABLET | Freq: Every day | ORAL | Status: DC
  Administered 2022-10-27 – 2022-11-02 (×6): 250 mg via ORAL
  Filled 2022-10-26 (×7): qty 1

## 2022-10-26 MED ORDER — ONDANSETRON HCL 4 MG/2ML IJ SOLN: 4.0000 mg | Freq: Four times a day (QID) | INTRAMUSCULAR | Status: DC | PRN

## 2022-10-26 MED ORDER — TIROFIBAN HCL IV 12.5 MG/250 ML
INTRAVENOUS | Status: AC
  Administered 2022-10-26: 1870 ug via INTRAVENOUS
  Filled 2022-10-26: qty 250

## 2022-10-26 MED ORDER — METHYLPREDNISOLONE SODIUM SUCC 125 MG IJ SOLR: 125.0000 mg | Freq: Once | INTRAMUSCULAR | Status: DC | PRN

## 2022-10-26 MED ORDER — FAMOTIDINE 20 MG PO TABS: 40.0000 mg | ORAL_TABLET | Freq: Once | ORAL | Status: DC | PRN

## 2022-10-26 MED ORDER — FENTANYL CITRATE (PF) 100 MCG/2ML IJ SOLN
INTRAMUSCULAR | Status: DC | PRN
  Administered 2022-10-26 (×2): 12.5 ug via INTRAVENOUS
  Administered 2022-10-26 (×2): 25 ug via INTRAVENOUS

## 2022-10-26 MED ORDER — QUETIAPINE FUMARATE 25 MG PO TABS
50.0000 mg | ORAL_TABLET | Freq: Every evening | ORAL | Status: DC
  Administered 2022-10-27 – 2022-11-02 (×7): 50 mg via ORAL
  Filled 2022-10-26 (×8): qty 2

## 2022-10-26 MED ORDER — SODIUM CHLORIDE 0.9 % IV SOLN: INTRAVENOUS | Status: DC

## 2022-10-26 MED ORDER — SODIUM CHLORIDE 0.9% FLUSH: 3.0000 mL | INTRAVENOUS | Status: DC | PRN

## 2022-10-26 MED ORDER — MORPHINE SULFATE (PF) 2 MG/ML IV SOLN
2.0000 mg | INTRAVENOUS | Status: DC | PRN
  Administered 2022-10-29 – 2022-10-30 (×2): 2 mg via INTRAVENOUS
  Filled 2022-10-26 (×2): qty 1

## 2022-10-26 MED ORDER — CHLORHEXIDINE GLUCONATE CLOTH 2 % EX PADS
6.0000 | MEDICATED_PAD | Freq: Once | CUTANEOUS | Status: DC
  Filled 2022-10-26: qty 6

## 2022-10-26 MED ORDER — SODIUM CHLORIDE 0.9 % IV SOLN: 250.0000 mL | INTRAVENOUS | Status: DC | PRN

## 2022-10-26 MED ORDER — CIPROFLOXACIN IN D5W 400 MG/200ML IV SOLN: 400.0000 mg | INTRAVENOUS | Status: AC

## 2022-10-26 MED ORDER — HYDRALAZINE HCL 20 MG/ML IJ SOLN
INTRAMUSCULAR | Status: AC
  Filled 2022-10-26: qty 1

## 2022-10-26 MED ORDER — IODIXANOL 320 MG/ML IV SOLN
INTRAVENOUS | Status: DC | PRN
  Administered 2022-10-26: 75 mL via INTRA_ARTERIAL

## 2022-10-26 MED ORDER — HEPARIN (PORCINE) 25000 UT/250ML-% IV SOLN
INTRAVENOUS | Status: AC
  Administered 2022-10-26: 1200 [IU]/h via INTRAVENOUS
  Filled 2022-10-26: qty 250

## 2022-10-26 MED ORDER — HEPARIN SODIUM (PORCINE) 1000 UNIT/ML IJ SOLN
INTRAMUSCULAR | Status: AC
  Filled 2022-10-26: qty 10

## 2022-10-26 MED ORDER — SODIUM CHLORIDE 0.9 % IV BOLUS
500.0000 mL | Freq: Once | INTRAVENOUS | Status: AC
  Administered 2022-10-26: 500 mL via INTRAVENOUS

## 2022-10-26 MED ORDER — INSULIN ASPART 100 UNIT/ML IJ SOLN
0.0000 [IU] | INTRAMUSCULAR | Status: DC
  Administered 2022-10-26 (×2): 3 [IU] via SUBCUTANEOUS
  Administered 2022-10-27 (×2): 2 [IU] via SUBCUTANEOUS
  Administered 2022-10-27: 7 [IU] via SUBCUTANEOUS
  Filled 2022-10-26 (×5): qty 1

## 2022-10-26 MED ORDER — HYDROMORPHONE HCL 1 MG/ML IJ SOLN: 1.0000 mg | Freq: Once | INTRAMUSCULAR | Status: DC | PRN

## 2022-10-26 MED ORDER — FENTANYL CITRATE PF 50 MCG/ML IJ SOSY: 12.5000 ug | PREFILLED_SYRINGE | Freq: Once | INTRAMUSCULAR | Status: DC | PRN

## 2022-10-26 MED ORDER — HEPARIN (PORCINE) 25000 UT/250ML-% IV SOLN: 1200.0000 [IU]/h | INTRAVENOUS | Status: DC

## 2022-10-26 MED ORDER — FENTANYL CITRATE (PF) 100 MCG/2ML IJ SOLN
INTRAMUSCULAR | Status: AC
  Filled 2022-10-26: qty 2

## 2022-10-26 MED ORDER — MIDAZOLAM HCL 2 MG/2ML IJ SOLN
INTRAMUSCULAR | Status: AC
  Filled 2022-10-26: qty 2

## 2022-10-26 MED ORDER — DIPHENHYDRAMINE HCL 50 MG/ML IJ SOLN: 50.0000 mg | Freq: Once | INTRAMUSCULAR | Status: DC | PRN

## 2022-10-26 MED ORDER — TIROFIBAN HCL IN NACL 5-0.9 MG/100ML-% IV SOLN
0.0750 ug/kg/min | INTRAVENOUS | Status: DC
  Filled 2022-10-26: qty 100

## 2022-10-26 MED ORDER — ATORVASTATIN CALCIUM 10 MG PO TABS
10.0000 mg | ORAL_TABLET | Freq: Every evening | ORAL | Status: DC
  Administered 2022-10-27 – 2022-11-02 (×6): 10 mg via ORAL
  Filled 2022-10-26 (×7): qty 1

## 2022-10-26 MED ORDER — MIDAZOLAM HCL 2 MG/2ML IJ SOLN
INTRAMUSCULAR | Status: DC | PRN
  Administered 2022-10-26: 1 mg via INTRAVENOUS
  Administered 2022-10-26 (×2): .5 mg via INTRAVENOUS

## 2022-10-26 MED ORDER — HEPARIN SODIUM (PORCINE) 5000 UNIT/ML IJ SOLN
INTRAMUSCULAR | Status: AC
  Filled 2022-10-26: qty 1

## 2022-10-26 MED ORDER — HEPARIN SODIUM (PORCINE) 1000 UNIT/ML IJ SOLN
INTRAMUSCULAR | Status: DC | PRN
  Administered 2022-10-26: 6000 [IU] via INTRAVENOUS

## 2022-10-26 MED ORDER — SODIUM CHLORIDE 0.9% FLUSH
3.0000 mL | Freq: Two times a day (BID) | INTRAVENOUS | Status: DC
  Administered 2022-10-26 – 2022-11-02 (×11): 3 mL via INTRAVENOUS

## 2022-10-26 MED ORDER — CIPROFLOXACIN IN D5W 400 MG/200ML IV SOLN
INTRAVENOUS | Status: AC
  Administered 2022-10-26: 400 mg via INTRAVENOUS
  Filled 2022-10-26: qty 200

## 2022-10-26 SURGICAL SUPPLY — 41 items
BALLN LUTONIX 018 4X220X130 (BALLOONS) ×1
BALLN LUTONIX 5X220X130 (BALLOONS) ×1
BALLN LUTONIX DCB 4X80X130 (BALLOONS) ×1
BALLN ULTRVRSE 2X300X150 (BALLOONS) ×1
BALLN ULTRVRSE 2X300X150 OTW (BALLOONS) ×1
BALLN ULTRVRSE 3X100X130C (BALLOONS) ×1
BALLN ULTRVRSE 3X80X150 (BALLOONS) ×1
BALLN ULTRVRSE 3X80X150 OTW (BALLOONS) ×1
BALLOON LUTONIX 018 4X220X130 (BALLOONS) IMPLANT
BALLOON LUTONIX 5X220X130 (BALLOONS) IMPLANT
BALLOON LUTONIX DCB 4X80X130 (BALLOONS) IMPLANT
BALLOON ULTRVRSE 2X300X150 OTW (BALLOONS) IMPLANT
BALLOON ULTRVRSE 3X100X130C (BALLOONS) IMPLANT
BALLOON ULTRVRSE 3X80X150 OTW (BALLOONS) IMPLANT
CATH ANGIO 5F PIGTAIL 65CM (CATHETERS) IMPLANT
CATH BEACON 5 .035 65 KMP TIP (CATHETERS) IMPLANT
CATH ROTAREX 135 6FR (CATHETERS) IMPLANT
CATH SEEKER .018X150 (CATHETERS) IMPLANT
CATH SEEKER .035X135CM (CATHETERS) IMPLANT
CATH VERT 5X100 (CATHETERS) IMPLANT
COVER DRAPE FLUORO 36X44 (DRAPES) IMPLANT
COVER PROBE ULTRASOUND 5X96 (MISCELLANEOUS) IMPLANT
DEVICE SAFEGUARD 24CM (GAUZE/BANDAGES/DRESSINGS) IMPLANT
DEVICE STARCLOSE SE CLOSURE (Vascular Products) IMPLANT
GLIDEWIRE ADV .035X260CM (WIRE) IMPLANT
GOWN STRL REUS W/ TWL LRG LVL3 (GOWN DISPOSABLE) ×1 IMPLANT
GOWN STRL REUS W/TWL LRG LVL3 (GOWN DISPOSABLE) ×1
GUIDEWIRE PFTE-COATED .018X300 (WIRE) IMPLANT
KIT ENCORE 26 ADVANTAGE (KITS) IMPLANT
NDL ENTRY 21GA 7CM ECHOTIP (NEEDLE) IMPLANT
NEEDLE ENTRY 21GA 7CM ECHOTIP (NEEDLE) ×1 IMPLANT
PACK ANGIOGRAPHY (CUSTOM PROCEDURE TRAY) ×1 IMPLANT
SET INTRO CAPELLA COAXIAL (SET/KITS/TRAYS/PACK) IMPLANT
SHEATH ANL2 6FRX45 HC (SHEATH) IMPLANT
SHEATH BRITE TIP 5FRX11 (SHEATH) IMPLANT
STENT VIABAHN 5X100X120 (Permanent Stent) IMPLANT
SUT MNCRL AB 4-0 PS2 18 (SUTURE) IMPLANT
SYR MEDRAD MARK 7 150ML (SYRINGE) IMPLANT
TUBING CONTRAST HIGH PRESS 72 (TUBING) IMPLANT
WIRE G V18X300CM (WIRE) IMPLANT
WIRE GUIDERIGHT .035X150 (WIRE) IMPLANT

## 2022-10-26 NOTE — Consult Note (Signed)
  ANTICOAGULATION CONSULT NOTE - Initial Consult  Pharmacy Consult for Heparin infusion Indication: DVT and limb ischemia  Allergies  Allergen Reactions   Amoxicillin-Pot Clavulanate Rash   Zosyn [Piperacillin Sod-Tazobactam So] Rash    Patient Measurements: Height: '5\' 2"'$  (157.5 cm) IBW/kg (Calculated) : 50.1 Heparin Dosing Weight: 66.3 kg  Vital Signs: Temp: 98 F (36.7 C) (01/19 1321) Temp Source: Oral (01/19 1321) BP: 182/57 (01/19 1321) Pulse Rate: 78 (01/19 1321)  Labs: Recent Labs    10/26/22 1042  HGB 12.3  HCT 38.6  PLT 403*  LABPROT 14.9  INR 1.2  CREATININE 1.53*    Estimated Creatinine Clearance: 26.4 mL/min (A) (by C-G formula based on SCr of 1.53 mg/dL (H)).   Medical History: Past Medical History:  Diagnosis Date   Anxiety    Colon cancer ([place])    Dementia ([place]) 2022   Diabetes ([place])    Hypertension    Peripheral vascular disease ([place])     Medications:  No history of chronic anticoagulant use PTA  Assessment: 82 y.o. 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 Patient was seen in the ER on 10/19/22 for evaluation of left leg pain and had an ultrasound which showed a left tibial vein DVT.  She was discharged home and advised to follow-up with vascular surgery who continued her dual antiplatelet therapy and did not place patient on anticoagulation due to her age and increased risk of falls. Limb is now with purplish/blue discoloration extending from left foot to the ankle. Vascular has been consulted, planning for left lower extremity angiography. Pharmacy has been consulted to initiate and titrate heparin infusion.  Baseline  labs: aPTT pending, INR 1.2, Hgb 12.3, Plts 402  Goal of Therapy:  Heparin level 0.3-0.7 units/ml Monitor platelets by anticoagulation protocol: Yes   Plan:  Give 5000 units bolus x 1 Start heparin infusion at 1200 units/hr Check anti-Xa level in 8 hours and daily while on heparin Continue to monitor H&H and platelets  Beth Mcguire A Alizea Pell 10/26/2022,2:16 PM

## 2022-10-26 NOTE — ED Provider Notes (Addendum)
[facility] Provider Note    Event Date/Time   First MD Initiated Contact with Patient 10/26/22 [phone]     (approximate)   History   Leg Pain   HPI  Beth Mcguire is a 82 y.o. female   Past medical history of peripheral vascular disease with stenting done in January 2023, dementia, colon cancer, hypertension, diabetes, who presents to the emergency department with left foot and left lower extremity pain.  Pain radiates from behind the left knee down through the foot.  There is some new discoloration of the left ankle and foot but the patient states started this morning after removing the sock discovered that the left foot was blue, states that this was new.  She denies chest pain, shortness of breath, fever or any other acute medical complaints.   External Medical Documents Reviewed: Emergency department visit dated 10/19/2022 when she was found to have questionable DVT in the left lower extremity and discharged with vascular surgery follow-up continue on aspirin and Plavix      Physical Exam   Triage Vital Signs: ED Triage Vitals [10/26/22 0950]  Enc Vitals Group     BP (!) 121/53     Pulse Rate 96     Resp 18     Temp 97.6 F (36.4 C)     Temp Source Oral     SpO2 98 %     Weight      Height      Head Circumference      Peak Flow      Pain Score      Pain Loc      Pain Edu?      Excl. in [place]?     Most recent vital signs: Vitals:   10/26/22 0950 10/26/22 0952  BP: (!) 121/53   Pulse: 96   Resp: 18   Temp: 97.6 F (36.4 C)   SpO2: 98% 98%    General: Awake, no distress.  CV:  Good peripheral perfusion.  Resp:  Normal effort. Abd:  No distention.  Other:  She has mild tenderness in the popliteal left side, along the cath, and blue discoloration with a sharp delineation around the ankle throughout her feet no crepitus or pain out of proportion but the foot does feel cold and I am not able to Doppler a pulse.   ED Results /  Procedures / Treatments   Labs (all labs ordered are listed, but only abnormal results are displayed) Labs Reviewed  BASIC METABOLIC PANEL - Abnormal; Notable for the following components:      Result Value   Glucose, Bld 262 (*)    BUN 35 (*)    Creatinine, Ser 1.53 (*)    GFR, Estimated 34 (*)    All other components within normal limits  CBC WITH DIFFERENTIAL/PLATELET - Abnormal; Notable for the following components:   WBC 14.0 (*)    Platelets 403 (*)    Neutro Abs 11.9 (*)    Abs Immature Granulocytes 0.08 (*)    All other components within normal limits  PROTIME-INR  LACTIC ACID, PLASMA  LACTIC ACID, PLASMA   I reviewed labs significant for glucose of 260, normal anion gap, increased creatinine 1.5 from 1, elevated white blood cell count of 14  EKG  ED ECG REPORT I, Lucillie Garfinkel, the attending physician, personally viewed and interpreted this ECG.   Date: 10/26/2022  EKG Time: 1045  Rate: 79  Rhythm: normal sinus rhythm  Axis: nl  Intervals:none  ST&T Change: no ischemic changes    RADIOLOGY I independently reviewed and interpreted x-ray of the foot and see no obvious acute fractures or dislocation.   PROCEDURES:  Critical Care performed: Yes, see critical care procedure note(s)  .Critical Care  Performed by: Lucillie Garfinkel, MD Authorized by: Lucillie Garfinkel, MD   Critical care provider statement:    Critical care time (minutes):  30   Critical care was necessary to treat or prevent imminent or life-threatening deterioration of the following conditions: Critical limb ischemia.   Critical care was time spent personally by me on the following activities:  Development of treatment plan with patient or surrogate, discussions with consultants, evaluation of patient's response to treatment, examination of patient, ordering and review of laboratory studies, ordering and review of radiographic studies, ordering and performing treatments and interventions, pulse oximetry,  re-evaluation of patient's condition and review of old charts    [place] ED: Medications  sodium chloride 0.9 % bolus 500 mL (has no administration in time range)    External physician / consultants:  I spoke with Dr. Delana Meyer of vascular surgery regarding care plan for this patient.   IMPRESSION / MDM / ASSESSMENT AND PLAN / ED COURSE  I reviewed the triage vital signs and the nursing notes.                                Patient's presentation is most consistent with acute presentation with potential threat to life or bodily function.  Differential diagnosis includes, but is not limited to, critical limb ischemia, restenosis of stent, DVT, infection, fracture dislocation    MDM: Patient with concern of restenosis of peripheral vascular disease stent performed previously, cold limb, no dopplerable pulses, so I contacted Dr. Delana Meyer vascular surgery and she will likely go angiography today.  He advised to hold off on CT angiogram to reduce contrast load at this time but agrees with DVT ultrasound.  Will get a x-ray as well.  Does not appear infectious.  No other medical complaints.  Will need admission.         FINAL CLINICAL IMPRESSION(S) / ED DIAGNOSES   Final diagnoses:  Left leg pain  AKI (acute kidney injury) ([place])     Rx / DC Orders   ED Discharge Orders     None        Note:  This document was prepared using Dragon voice recognition software and may include unintentional dictation errors.    Lucillie Garfinkel, MD 10/26/22 [place], MD 10/26/22 1125

## 2022-10-26 NOTE — Assessment & Plan Note (Addendum)
Transition heparin drip >> Eliquis.   Continue aspirin and Plavix.

## 2022-10-26 NOTE — Assessment & Plan Note (Addendum)
Status post procedure 1/19 by Dr. Delana Meyer to try to obtain the best circulation to her left foot.  Left foot demarcated from the low sock line down.  Dr. Leslye Peer discussed with vascular team and they stated that the foot actually looks better than what it did when she came in.  No further intervention planned.   Transitioned from IV heparin to Eliquis. Also needs aspirin and Plavix.

## 2022-10-26 NOTE — Assessment & Plan Note (Addendum)
Hold antihypertensive medications

## 2022-10-26 NOTE — Op Note (Signed)
[name]  Percutaneous Study/Intervention Procedural Note   Date of Surgery: 10/26/2022  Surgeon:  Hortencia Pilar  Pre-operative Diagnosis: Atherosclerotic occlusive disease bilateral lower extremities with left lower extremity ischemia and rest pain.  Post-operative diagnosis:  Same  Procedure(s) Performed:             1.  Introduction catheter into left lower extremity 3rd order catheter placement               2.    Contrast injection left lower extremity for distal runoff             3.  Percutaneous transluminal angioplasty left superficial femoral and popliteal arteries to 5 mm             4.  Percutaneous transluminal angioplasty and stent placement left posterior tibial             5.  Mechanical thrombectomy of the entire SFA and popliteal using the [place] Rex device.               6.  Star close closure right common femoral arteriotomy  Anesthesia: Conscious sedation was administered under my direct supervision by the interventional radiology RN. IV Versed plus fentanyl were utilized. Continuous ECG, pulse oximetry and blood pressure was monitored throughout the entire procedure.  Conscious sedation was for a total of 97 minutes.  Sheath: 6 [place] Ansell right common femoral retrograde  Contrast: 75 cc  Fluoroscopy Time: 23.3 minutes  Indications:  Beth Mcguire presents to the [facility] regional emergency room with an ischemic foot.  She notes the pain began this morning.  On presentation she had a frankly ischemic foot with no pulses.  This suggests the patient is having limb threatening ischemia.  Angiography with the hope for limb salvage is recommended.  The risks and benefits are reviewed all questions answered patient agrees to proceed.  Procedure:   Beth Mcguire is a 82 y.o. y.o. female who was identified and appropriate procedural time out was performed.  The patient was then placed supine on the table and prepped and draped in the usual  sterile fashion.    Ultrasound was placed in the sterile sleeve and the right groin was evaluated the right common femoral artery was echolucent and pulsatile indicating patency.  Image was recorded for the permanent record and under real-time visualization a microneedle was inserted into the common femoral artery followed by the microwire and then the micro-sheath.  A J-wire was then advanced through the micro-sheath and a  5 [place] sheath was then inserted over a J-wire. J-wire was then advanced and a 5 French pigtail catheter was positioned at the level of T12.  AP projection of the aorta was then obtained. Pigtail catheter was repositioned to above the bifurcation and a RAO view of the pelvis was obtained.  Subsequently a pigtail catheter with an Advantage wire was used to cross the aortic bifurcation.  The catheter and wire were advanced down into the left distal external iliac artery. Oblique view of the femoral bifurcation was then obtained and subsequently the wire was reintroduced and the pigtail catheter negotiated into the SFA representing third order catheter placement and then down into the tibioperoneal trunk. Distal runoff was then performed.  5000 units of heparin was then given and allowed to circulate for several minutes.  A 6 French Ansell sheath was advanced up and over the bifurcation and positioned in the femoral artery  KMP catheter and  advantage Glidewire were then negotiated down into the posterior tibial.  The [place] Rex catheter was then prepped on the field and thrombectomy was performed from the origin of the SFA down into the tibioperoneal trunk.  Follow-up imaging demonstrated restoration of flow through the SFA and popliteal with a significant reduction of the thrombus within the SFA and popliteal.  At the level of the knee there was still some thrombus noted.  Next, I angioplastied the SFA and popliteal using a 4 mm x 100 mm Lutonix drug-eluting balloon to treat the tibioperoneal  trunk and distal popliteal and then a 5 mm x 220 mm Lutonix drug-eluting balloon to treat the mid and proximal popliteal as well as the entire length of the SFA.  Follow-up imaging demonstrated a marked improvement there was still significant residual stenosis at the level of the knee within the popliteal and the tibial outflow tract was still very diseased.  However I felt that treating the posterior tibial to get better outflow would be the next best option.  The detector was then positioned distally. A 2 mm x 300 mm Ultraverse balloon was advanced into the posterior tibial.  Inflation was to 12 atmospheres for 1 minute. Follow-up imaging demonstrated patency with less than 10% residual stenosis throughout the course of the posterior tibial except for the first 20 mm at the origin of the posterior tibial this remained with greater than 50% stenosis.  I then readdressed the distal popliteal and tibioperoneal trunk.  Follow-up angioplasty did not improve and I had no choice but to extend the Viabahn stents.  I elected to use a 5 mm x 150 mm Viabahn stent and deployed it 2 cm into the posterior tibial artery all the way back to the above-knee popliteal.  It was then post dilated with a 4 mm Lutonix drug-eluting balloon distally and a 5 mm balloon proximally.  Distal runoff was then reassessed and noted to be patent although flow was sluggish considering the single-vessel runoff.  For this reason I elected to initiate Aggrastat overnight.  After review of these images the sheath is pulled into the right external iliac oblique of the common femoral is obtained and a Star close device deployed. There no immediate Complications.  Findings:  The abdominal aorta is opacified with a bolus injection contrast. Renal arteries are single and widely patent without evidence of hemodynamically significant stenosis.  The aorta itself has diffuse disease but no hemodynamically significant lesions. The common and external iliac  arteries are widely patent bilaterally.  The left common femoral is patent as is the profunda femoris.  The profunda femoris is indeed patent but there is diffuse severe disease which seems to worsen distally.  The SFA is occluded at its origin and remains occluded throughout its entire length including the entire length of the popliteal and the tibioperoneal trunk.  There is no reconstitution of any tibial vessels on the initial imaging.  After negotiating a catheter into the posterior tibial I am able to see that there is occlusion at the origin extending for 15 to 20 mm there are then 3 or 4 string signs (greater than 90% lesions) throughout its course.  At the level of the medial malleolus the posterior tibial has normal caliber and it does fill the medial and lateral plantar arteries.  Following thrombectomy with the [place] Rex I have been able to reestablish flow but it uncovered numerous greater than 70% lesions within the SFA and popliteal as well as the tibioperoneal trunk.  Following angioplasty posterior tibial is patent with less than 20% residual stenosis with the exception of the proximal 15 to 20 mm this remains greater than 50% therefore a stent is deployed approximately 20 mm into the posterior tibial and balloon to 4 mm. Angioplasty of the SFA and the popliteal artery yields resolution of the majority lesions.  The Viabahn stent is extended from the origin of the posterior tibial into the mid popliteal with resolution of the residual high-grade lesions.  Summary: Successful recanalization left lower extremity for limb salvage.  However, the patient has very disadvantaged outflow and the current reconstruction is of unknown durability.  In the event that her ischemia recurs she would require amputation.                           Disposition: Patient was taken to the recovery room in stable condition having tolerated the procedure well.  Belenda Cruise Ewell Benassi 10/26/2022,5:50 PM

## 2022-10-26 NOTE — Assessment & Plan Note (Addendum)
AKI on CKD stage III A.  Baseline creatinine around 1.15.  She came in at 1.53.  Creatinine 1.77 on 1/22.  Creatinine 1.45 on 1/23..  On IV fluid hydration.

## 2022-10-26 NOTE — H&P (View-Only) (Signed)
Hospital Consult    Reason for Consult:  Cold Left [place] Requesting Physician:  Dr. Lavonia Drafts MD MRN #:  [id number]  History of Present Illness: This is a 82 y.o. female Past medical history of peripheral vascular disease with stenting done in January 2023, dementia, colon cancer, hypertension, diabetes, who presents to the emergency department with left foot and left lower extremity pain.  Pain radiates from behind the left knee down through the foot.  There is some new discoloration of the left ankle and foot but the patient states started this morning after removing the sock discovered that the left foot was blue, states that this was new.   She denies chest pain, shortness of breath, fever or any other acute medical complaints. She states she has not eaten today. Confirmed with Daughter on the phone.   Past Medical History:  Diagnosis Date   Anxiety    Colon cancer ([place])    Dementia ([place]) 2022   Diabetes ([place])    Hypertension    Peripheral vascular disease ([place])     Past Surgical History:  Procedure Laterality Date   AMPUTATION Right 01/14/2016   Procedure: AMPUTATION RAY;  Surgeon: Albertine Patricia, DPM;  Location: [facility];  Service: Podiatry;  Laterality: Right;   AMPUTATION Left 11/26/2021   Procedure: AMPUTATION FIRST RAY;  Surgeon: Sharlotte Alamo, DPM;  Location: [facility];  Service: Podiatry;  Laterality: Left;   AMPUTATION TOE Left 02/11/2022   Procedure: Left Second Toe Amputation;  Surgeon: Samara Deist, DPM;  Location: [facility];  Service: Podiatry;  Laterality: Left;   AMPUTATION TOE Left 05/30/2022   Procedure: AMPUTATION TOE , LEFT THIRD TOE;  Surgeon: Sharlotte Alamo, DPM;  Location: [facility];  Service: Podiatry;  Laterality: Left;   COLON SURGERY     LOWER EXTREMITY ANGIOGRAPHY Left 08/14/2017   Procedure: Lower Extremity Angiography;  Surgeon: Algernon Huxley, MD;  Location: [place] [facility];  Service: Cardiovascular;  Laterality: Left;   LOWER EXTREMITY  ANGIOGRAPHY Left 11/06/2021   Procedure: LOWER EXTREMITY ANGIOGRAPHY;  Surgeon: Algernon Huxley, MD;  Location: [place] [facility];  Service: Cardiovascular;  Laterality: Left;   PERIPHERAL VASCULAR CATHETERIZATION N/A 01/05/2016   Procedure: Lower Extremity Angiography;  Surgeon: Algernon Huxley, MD;  Location: [place] [facility];  Service: Cardiovascular;  Laterality: N/A;   PERIPHERAL VASCULAR CATHETERIZATION  01/05/2016   Procedure: Lower Extremity Intervention;  Surgeon: Algernon Huxley, MD;  Location: [place] [facility];  Service: Cardiovascular;;   PERIPHERAL VASCULAR CATHETERIZATION Right 06/25/2016   Procedure: Lower Extremity Angiography;  Surgeon: Algernon Huxley, MD;  Location: [place] [facility];  Service: Cardiovascular;  Laterality: Right;   PERIPHERAL VASCULAR CATHETERIZATION  06/25/2016   Procedure: Lower Extremity Intervention;  Surgeon: Algernon Huxley, MD;  Location: [place] [facility];  Service: Cardiovascular;;   TONSILLECTOMY      Allergies  Allergen Reactions   Amoxicillin-Pot Clavulanate Rash   Zosyn [Piperacillin Sod-Tazobactam So] Rash    Prior to Admission medications   Medication Sig Start Date End Date Taking? Authorizing Provider  aspirin 81 MG tablet Take 1 tablet (81 mg total) by mouth daily. 01/06/16  Yes Sudini, Alveta Heimlich, MD  atorvastatin (LIPITOR) 10 MG tablet Take 10 mg every evening by mouth.    Yes [provider]  clopidogrel (PLAVIX) 75 MG tablet TAKE 1 TABLET BY MOUTH EVERY DAY 09/22/20  Yes Kris Hartmann, NP  divalproex (DEPAKOTE ER) 250 MG 24 hr tablet Take  250 mg by mouth daily.   Yes [provider]  lisinopril (ZESTRIL) 10 MG tablet Take 10 mg by mouth in the morning.   Yes [provider]  loratadine (CLARITIN REDITABS) 10 MG dissolvable tablet Take 10 mg by mouth daily as needed for allergies.   Yes [provider]  metFORMIN (GLUCOPHAGE-XR) 500 MG 24 hr tablet Take 500 mg by mouth daily with supper.   Yes [provider]  NOVOLOG MIX 70/30 FLEXPEN (70-30) 100 UNIT/ML FlexPen Inject 22-28 Units into the skin See admin instructions. Inject 28u under the skin ever morning after breakfast and inject 22u under the skin every evening after supper   Yes [provider]  QUEtiapine (SEROQUEL) 50 MG tablet Take 50 mg by mouth every evening.   Yes [provider]  HYDROcodone-acetaminophen (NORCO/VICODIN) 5-325 MG tablet Take 1 tablet by mouth every 4 (four) hours as needed for moderate pain. Patient not taking: Reported on 10/26/2022    [provider]  traMADol (ULTRAM) 50 MG tablet Take 1 tablet (50 mg total) by mouth every 6 (six) hours as needed. Patient not taking: Reported on 10/26/2022 10/22/22   Kris Hartmann, NP    Social History   Socioeconomic History   Marital status: Widowed    Spouse name: Not on file   Number of children: 3   Years of education: Not on file   Highest education level: Not on file  Occupational History   Not on file  Tobacco Use   Smoking status: Never    Passive exposure: Never   Smokeless tobacco: Never  Substance and Sexual Activity   Alcohol use: No    Alcohol/week: 0.0 standard drinks of alcohol   Drug use: No   Sexual activity: Not Currently  Other Topics Concern   Not on file  Social History Narrative   Daughter And her 2 kids    lives with pt.    Social Determinants of Health   Financial Resource Strain: Not on file  Food Insecurity: No Food Insecurity (07/13/2022)   Hunger Vital Sign    Worried About Running Out of Food in the Last Year: Never true    Ran Out of Food in the Last Year: Never true  Transportation Needs: No Transportation Needs (07/13/2022)   PRAPARE - Hydrologist (Medical): No    Lack of Transportation (Non-Medical): No  Physical Activity: Not on file  Stress: Not on file  Social Connections: Not on file  Intimate Partner Violence: Not At Risk (07/13/2022)   Humiliation,  Afraid, Rape, and Kick questionnaire    Fear of Current or Ex-Partner: No    Emotionally Abused: No    Physically Abused: No    Sexually Abused: No     Family History  Problem Relation Age of Onset   Diabetes Other     ROS: Otherwise negative unless mentioned in HPI  Physical Examination  Vitals:   10/26/22 0950 10/26/22 0952  BP: (!) 121/53   Pulse: 96   Resp: 18   Temp: 97.6 F (36.4 C)   SpO2: 98% 98%   There is no height or weight on file to calculate BMI.  General:  WDWN in NAD Gait: Not observed HENT: WNL, normocephalic Pulmonary: normal non-labored breathing, without Rales, rhonchi,  wheezing Cardiac: regular, without  Murmurs, rubs or gallops; without carotid bruits Abdomen: Positive bowel sounds, soft, NT/ND, no masses Skin: without rashes Vascular Exam/Pulses: Non Palpable post tibial  and dorsalis pedis pulses  Extremities: with ischemic changes, without Gangrene , with cellulitis; with open wounds;  Musculoskeletal: no muscle wasting or atrophy  Neurologic: A&O X 3;  No focal weakness or paresthesias are detected; speech is fluent/normal Psychiatric:  The pt has Normal affect. Lymph:  Unremarkable  CBC    Component Value Date/Time   WBC 11.0 (H) 07/21/2022 1327   RBC 3.86 (L) 07/21/2022 1327   HGB 11.2 (L) 07/21/2022 1327   HGB 13.2 12/04/2013 0953   HCT 35.7 (L) 07/21/2022 1327   HCT 41.0 12/04/2013 0953   PLT 519 (H) 07/21/2022 1327   PLT 324 12/04/2013 0953   MCV 92.5 07/21/2022 1327   MCV 85 12/04/2013 0953   MCH 29.0 07/21/2022 1327   MCHC 31.4 07/21/2022 1327   RDW 13.7 07/21/2022 1327   RDW 14.1 12/04/2013 0953   LYMPHSABS 3.3 07/21/2022 1327   LYMPHSABS 2.3 12/04/2013 0953   MONOABS 0.4 07/21/2022 1327   MONOABS 0.4 12/04/2013 0953   EOSABS 0.2 07/21/2022 1327   EOSABS 0.2 12/04/2013 0953   BASOSABS 0.1 07/21/2022 1327   BASOSABS 0.1 12/04/2013 0953    BMET    Component Value Date/Time   NA 135 07/21/2022 1327   NA 142  11/13/2011 1110   K 5.1 07/21/2022 1327   K 4.6 11/13/2011 1110   CL 107 07/21/2022 1327   CL 106 11/13/2011 1110   CO2 23 07/21/2022 1327   CO2 28 11/13/2011 1110   GLUCOSE 220 (H) 07/21/2022 1327   GLUCOSE 123 (H) 11/13/2011 1110   BUN 17 07/21/2022 1327   BUN 12 11/13/2011 1110   CREATININE 1.15 (H) 07/21/2022 1327   CREATININE 1.10 12/04/2013 0953   CALCIUM 8.7 (L) 07/21/2022 1327   CALCIUM 8.7 11/13/2011 1110   GFRNONAA 47 (L) 07/21/2022 1327   GFRNONAA 49 (L) 12/04/2013 0953   GFRAA 54 (L) 08/14/2017 0912   GFRAA 57 (L) 12/04/2013 0953    COAGS: Lab Results  Component Value Date   INR 1.1 05/30/2022     Non-Invasive Vascular Imaging:   EXAM: LEFT LOWER EXTREMITY VENOUS DOPPLER ULTRASOUND  IMPRESSION: Persistent isolated calf DVT without evidence of propagation into the popliteal vein.  Statin:  No. Beta Blocker:  No. Aspirin:  Yes.   ACEI:  No. ARB:  No. CCB use:  No Other antiplatelets/anticoagulants:  Yes.   ASA and Plavix   ASSESSMENT/PLAN: This is a 82 y.o. female with a history of diabetes, peripheral arterial disease with reported stents in her lower extremity who presents with complaints of left-sided calf pain. Patient reports this morning when she went to get out of bed her left calf was hurting whenever she moved it but it felt fine when she held it still.   Plan: Patient needs an angiogram of the left lower extremity.  Known history of tibial DVT.  Patient's foot is very cold to the touch today and erythemic and purple.  Patient has pain at rest.  Patient has history of surgical removal of toes on the left foot.  Discussed in detail with her today the procedure, benefits, risks, and complications.  All the patient's questions.  Patient verbalizes she would like to proceed with the procedure to help her left lower foot.  Patient has been n.p.o. since 8:00 last night.      Drema P[facility] 10/26/2022 11:03 AM

## 2022-10-26 NOTE — Interval H&P Note (Signed)
History and Physical Interval Note:  10/26/2022 3:58 PM  Beth Mcguire  has presented today for surgery, with the diagnosis of PAD.  The various methods of treatment have been discussed with the patient and family. After consideration of risks, benefits and other options for treatment, the patient has consented to  Procedure(s): Lower Extremity Angiography (Left) as a surgical intervention.  The patient's history has been reviewed, patient examined, no change in status, stable for surgery.  I have reviewed the patient's chart and labs.  Questions were answered to the patient's satisfaction.     Beth Mcguire

## 2022-10-26 NOTE — Progress Notes (Signed)
Subjective:    Patient ID: Beth Mcguire, female    DOB: [birth year], 82 y.o.   MRN: [id number] Chief Complaint  Patient presents with   Follow-up    [facility] ED follow up    Beth Mcguire is an 82 year old female who presents today following development of DVT in her left lower extremity.  This DVT is located in her tibial vessels.  She does note that she has some aching in her lower extremity along with swelling.  She is also been endorsing pain with cramping with walking and discomfort.  This is what prompted her visit to the emergency department where she was diagnosed with a tibial vessel DVT.  She notes that at rest the pain is improved.  Currently there is no open wounds or ulcerations.    Review of Systems  Cardiovascular:  Positive for leg swelling.  Neurological:  Positive for weakness.  All other systems reviewed and are negative.      Objective:   Physical Exam Vitals reviewed.  HENT:     Head: Normocephalic.  Cardiovascular:     Rate and Rhythm: Normal rate.  Pulmonary:     Effort: Pulmonary effort is normal.  Musculoskeletal:     Left lower leg: Edema present.  Skin:    General: Skin is warm and dry.     Capillary Refill: Capillary refill takes 2 to 3 seconds.  Neurological:     Mental Status: She is alert and oriented to person, place, and time.  Psychiatric:        Mood and Affect: Mood normal.        Behavior: Behavior normal.        Thought Content: Thought content normal.        Judgment: Judgment normal.     BP 113/71 (BP Location: Left Arm)   Pulse 78   Resp 15   Past Medical History:  Diagnosis Date   Anxiety    Colon cancer ([place])    Dementia ([place]) 2022   Diabetes ([place])    Hypertension    Peripheral vascular disease ([place])     Social History   Socioeconomic History   Marital status: Widowed    Spouse name: Not on file   Number of children: 3   Years of education: Not on file   Highest education level: Not on file  Occupational  History   Not on file  Tobacco Use   Smoking status: Never    Passive exposure: Never   Smokeless tobacco: Never  Substance and Sexual Activity   Alcohol use: No    Alcohol/week: 0.0 standard drinks of alcohol   Drug use: No   Sexual activity: Not Currently  Other Topics Concern   Not on file  Social History Narrative   Daughter And her 2 kids    lives with pt.    Social Determinants of Health   Financial Resource Strain: Not on file  Food Insecurity: No Food Insecurity (07/13/2022)   Hunger Vital Sign    Worried About Running Out of Food in the Last Year: Never true    Ran Out of Food in the Last Year: Never true  Transportation Needs: No Transportation Needs (07/13/2022)   PRAPARE - Hydrologist (Medical): No    Lack of Transportation (Non-Medical): No  Physical Activity: Not on file  Stress: Not on file  Social Connections: Not on file  Intimate Partner Violence: Not At Risk (07/13/2022)   Humiliation,  Afraid, Rape, and Kick questionnaire    Fear of Current or Ex-Partner: No    Emotionally Abused: No    Physically Abused: No    Sexually Abused: No    Past Surgical History:  Procedure Laterality Date   AMPUTATION Right 01/14/2016   Procedure: AMPUTATION RAY;  Surgeon: Albertine Patricia, DPM;  Location: [facility];  Service: Podiatry;  Laterality: Right;   AMPUTATION Left 11/26/2021   Procedure: AMPUTATION FIRST RAY;  Surgeon: Sharlotte Alamo, DPM;  Location: [facility];  Service: Podiatry;  Laterality: Left;   AMPUTATION TOE Left 02/11/2022   Procedure: Left Second Toe Amputation;  Surgeon: Samara Deist, DPM;  Location: [facility];  Service: Podiatry;  Laterality: Left;   AMPUTATION TOE Left 05/30/2022   Procedure: AMPUTATION TOE , LEFT THIRD TOE;  Surgeon: Sharlotte Alamo, DPM;  Location: [facility];  Service: Podiatry;  Laterality: Left;   COLON SURGERY     LOWER EXTREMITY ANGIOGRAPHY Left 08/14/2017   Procedure: Lower Extremity Angiography;  Surgeon: Algernon Huxley, MD;  Location: [place] [facility];  Service: Cardiovascular;  Laterality: Left;   LOWER EXTREMITY ANGIOGRAPHY Left 11/06/2021   Procedure: LOWER EXTREMITY ANGIOGRAPHY;  Surgeon: Algernon Huxley, MD;  Location: [place] [facility];  Service: Cardiovascular;  Laterality: Left;   PERIPHERAL VASCULAR CATHETERIZATION N/A 01/05/2016   Procedure: Lower Extremity Angiography;  Surgeon: Algernon Huxley, MD;  Location: [place] [facility];  Service: Cardiovascular;  Laterality: N/A;   PERIPHERAL VASCULAR CATHETERIZATION  01/05/2016   Procedure: Lower Extremity Intervention;  Surgeon: Algernon Huxley, MD;  Location: [place] [facility];  Service: Cardiovascular;;   PERIPHERAL VASCULAR CATHETERIZATION Right 06/25/2016   Procedure: Lower Extremity Angiography;  Surgeon: Algernon Huxley, MD;  Location: [place] [facility];  Service: Cardiovascular;  Laterality: Right;   PERIPHERAL VASCULAR CATHETERIZATION  06/25/2016   Procedure: Lower Extremity Intervention;  Surgeon: Algernon Huxley, MD;  Location: [place] [facility];  Service: Cardiovascular;;   TONSILLECTOMY      Family History  Problem Relation Age of Onset   Diabetes Other     Allergies  Allergen Reactions   Amoxicillin-Pot Clavulanate Rash   Zosyn [Piperacillin Sod-Tazobactam So] Rash       Latest Ref Rng & Units 07/21/2022    1:27 PM 07/14/2022    6:15 AM 07/12/2022   11:39 PM  CBC  WBC 4.0 - 10.5 K/uL 11.0  9.7  16.2   Hemoglobin 12.0 - 15.0 g/dL 11.2  9.1  10.4   Hematocrit 36.0 - 46.0 % 35.7  27.8  33.3   Platelets 150 - 400 K/uL 519  330  403       CMP     Component Value Date/Time   NA 135 07/21/2022 1327   NA 142 11/13/2011 1110   K 5.1 07/21/2022 1327   K 4.6 11/13/2011 1110   CL 107 07/21/2022 1327   CL 106 11/13/2011 1110   CO2 23 07/21/2022 1327   CO2 28 11/13/2011 1110   GLUCOSE 220 (H) 07/21/2022 1327   GLUCOSE 123 (H) 11/13/2011 1110   BUN 17 07/21/2022 1327   BUN 12 11/13/2011 1110   CREATININE 1.15 (H) 07/21/2022  1327   CREATININE 1.10 12/04/2013 0953   CALCIUM 8.7 (L) 07/21/2022 1327   CALCIUM 8.7 11/13/2011 1110   PROT 7.1 07/21/2022 1327   PROT 7.6 12/04/2013 0953   ALBUMIN 3.4 (L) 07/21/2022 1327   ALBUMIN 3.6 12/04/2013 0953   AST 17 07/21/2022  1327   AST 15 12/04/2013 0953   ALT 14 07/21/2022 1327   ALT 19 12/04/2013 0953   ALKPHOS 147 (H) 07/21/2022 1327   ALKPHOS 112 12/04/2013 0953   BILITOT 0.3 07/21/2022 1327   BILITOT 0.3 12/04/2013 0953   GFRNONAA 47 (L) 07/21/2022 1327   GFRNONAA 49 (L) 12/04/2013 0953   GFRAA 54 (L) 08/14/2017 0912   GFRAA 57 (L) 12/04/2013 0953     VAS [place] ABI WITH/WO TBI  Result Date: 09/14/2022  LOWER EXTREMITY DOPPLER STUDY Patient Name:  Beth Mcguire  Date of Exam:   09/11/2022 Medical Rec #: [id number]       Accession #:    [id number] Date of Birth: [birth year]       Patient Gender: F Patient Age:   28 years Exam Location:  [facility] Procedure:      VAS [place] ABI WITH/WO TBI Referring Phys: Corene Cornea DEW --------------------------------------------------------------------------------  Indications: Peripheral artery disease. High Risk Factors: Hyperlipidemia, no history of smoking.  Vascular Interventions: 06/25/16: Right SFA/popliteal artery stent;                         08/14/17: Left SFA/popliteal artery PTA/stent with left                         TP trunk & distal posterior tibial artery PTA;                         11/06/21: Left SFA thrombectomy/PTA/stent with left                         popliteal artery thrombecomty/PTA;                         05/2022 left 2nd toe removed by podiatrist. Performing Technologist: Delorise Shiner RVT  Examination Guidelines: A complete evaluation includes at minimum, Doppler waveform signals and systolic blood pressure reading at the level of bilateral brachial, anterior tibial, and posterior tibial arteries, when vessel segments are accessible. Bilateral testing is considered an integral part of a complete examination.  Photoelectric Plethysmograph (PPG) waveforms and toe systolic pressure readings are included as required and additional duplex testing as needed. Limited examinations for reoccurring indications may be performed as noted.  ABI Findings: +--------+------------------+-----+--------+--------+ Right   Rt Pressure (mmHg)IndexWaveformComment  +--------+------------------+-----+--------+--------+ [id number]                                     +--------+------------------+-----+--------+--------+ PTA     108               0.59 biphasic         +--------+------------------+-----+--------+--------+ PERO    126               0.69 biphasic         +--------+------------------+-----+--------+--------+ DP      136               0.74 biphasic         +--------+------------------+-----+--------+--------+ +--------+------------------+-----+----------+-------+ Left    Lt Pressure (mmHg)IndexWaveform  Comment +--------+------------------+-----+----------+-------+ [id number]                                      +--------+------------------+-----+----------+-------+  PTA     163               0.89 monophasic        +--------+------------------+-----+----------+-------+ DP      139               0.76 monophasic        +--------+------------------+-----+----------+-------+ +-------+-----------+-----------+------------+------------+ ABI/TBIToday's ABIToday's TBIPrevious ABIPrevious TBI +-------+-----------+-----------+------------+------------+ Right  0.74       Amputation 1.04        Amputation   +-------+-----------+-----------+------------+------------+ Left   0.89       Amputation 1.07        Amputation   +-------+-----------+-----------+------------+------------+ Limited imaging showed patent stents bilaterally with a >50% stenosis of the proximal right SFA stent. Bilateral ABIs appear decreased compared to prior study on 06/12/2022.  Summary: Right: Resting  right ankle-brachial index indicates moderate right lower extremity arterial disease. Left: Resting left ankle-brachial index indicates mild left lower extremity arterial disease. *See table(s) above for measurements and observations.  Electronically signed by Leotis Pain MD on 09/14/2022 at 12:02:25 PM.    Final        Assessment & Plan:   1. Acute deep vein thrombosis (DVT) of tibial vein of left lower extremity ([place]) Patient has a tibial level DVT and is currently on aspirin and Plavix.  Given the patient's advanced age I do not believe that moving forward with Eliquis will provide her a drastic amount of improvement given that this is a tibial level DVT.  Will continue with Plavix and aspirin have her return in 2 weeks to repeat studies to evaluate for propagation.  2. PAD (peripheral artery disease) ([place]) Based on some of the pain and discomfort that she is having I suspect some of this may be related to her PAD.  Will have her return with ABIs as well.  3. Type 2 diabetes mellitus with stage 3 chronic kidney disease, with long-term current use of insulin, unspecified whether stage 3a or 3b CKD ([place]) Continue hypoglycemic medications as already ordered, these medications have been reviewed and there are no changes at this time.  Hgb A1C to be monitored as already arranged by primary service   Current Outpatient Medications on File Prior to Visit  Medication Sig Dispense Refill   aspirin 81 MG tablet Take 1 tablet (81 mg total) by mouth daily.     atorvastatin (LIPITOR) 10 MG tablet Take 10 mg every evening by mouth.      clopidogrel (PLAVIX) 75 MG tablet TAKE 1 TABLET BY MOUTH EVERY DAY 90 tablet 1   HYDROcodone-acetaminophen (NORCO/VICODIN) 5-325 MG tablet Take 1 tablet by mouth every 4 (four) hours as needed for moderate pain. (Patient not taking: Reported on 10/26/2022)     lisinopril (ZESTRIL) 10 MG tablet Take 10 mg by mouth in the morning.     loratadine (CLARITIN REDITABS) 10 MG  dissolvable tablet Take 10 mg by mouth daily as needed for allergies.     metFORMIN (GLUCOPHAGE-XR) 500 MG 24 hr tablet Take 500 mg by mouth daily with supper.  0   NOVOLOG MIX 70/30 FLEXPEN (70-30) 100 UNIT/ML FlexPen Inject 22-28 Units into the skin See admin instructions. Inject 28u under the skin ever morning after breakfast and inject 22u under the skin every evening after supper     QUEtiapine (SEROQUEL) 50 MG tablet Take 50 mg by mouth every evening.     No current facility-administered medications on file prior to visit.    There are  no Patient Instructions on file for this visit. No follow-ups on file.   Kris Hartmann, NP

## 2022-10-26 NOTE — H&P (Signed)
History and Physical    Patient: Beth Mcguire UKG:[id number] DOB: 04/09/1941 DOA: 10/26/2022 DOS: the patient was seen and examined on 10/26/2022 PCP: [name], MD  Patient coming from: Home  Chief Complaint:  Chief Complaint  Patient presents with   Leg Pain   Most of the history was obtained from [name] over the phone HPI: Beth Mcguire is a 82 y.o. 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 Patient was seen in the ER on 10/19/22 for evaluation of left leg pain and had an ultrasound which showed a left tibial vein DVT.  She was discharged home and advised to follow-up with vascular surgery who continued her dual antiplatelet therapy and did not place patient on anticoagulation due to her age and increased risk of falls. Daughter notes that she complained of worsening back pain on the day of admission and she took off her sock and noted that her left foot had purplish blue discoloration extending from the left foot to the ankle and so she called EMS and patient was brought into the ER for further evaluation. She rates the pain in her leg a 10 x 10 in intensity at its worst. She denies having any chest pain, no shortness of breath, no nausea, no vomiting, no dizziness, no lightheadedness, no headache, no urinary symptoms, no blurred vision no focal deficit Abnormal labs include glucose of 262, white count 14.0, platelet count 403, serum creatinine 1.53 above a baseline of 1.15 Patient has been started on a heparin drip and vascular surgery has been consulted    Review of Systems: As mentioned in the history of present illness. All  other systems reviewed and are negative. Past Medical History:  Diagnosis Date   Anxiety    Colon cancer ([place])    Dementia ([place]) 2022   Diabetes ([place])    Hypertension    Peripheral vascular disease ([place])    Past Surgical History:  Procedure Laterality Date   AMPUTATION Right 01/14/2016   Procedure: AMPUTATION RAY;  Surgeon: Albertine Patricia, DPM;  Location: [facility];  Service: Podiatry;  Laterality: Right;   AMPUTATION Left 11/26/2021   Procedure: AMPUTATION FIRST RAY;  Surgeon: Sharlotte Alamo, DPM;  Location: [facility];  Service: Podiatry;  Laterality: Left;   AMPUTATION TOE Left 02/11/2022   Procedure: Left Second Toe Amputation;  Surgeon: Samara Deist, DPM;  Location: [facility];  Service: Podiatry;  Laterality: Left;   AMPUTATION TOE Left 05/30/2022   Procedure: AMPUTATION TOE , LEFT THIRD TOE;  Surgeon: Sharlotte Alamo, DPM;  Location: [facility];  Service: Podiatry;  Laterality: Left;   COLON SURGERY     LOWER EXTREMITY ANGIOGRAPHY Left 08/14/2017   Procedure: Lower Extremity Angiography;  Surgeon: Algernon Huxley, MD;  Location: [place] [facility];  Service: Cardiovascular;  Laterality: Left;   LOWER EXTREMITY ANGIOGRAPHY Left 11/06/2021   Procedure: LOWER EXTREMITY ANGIOGRAPHY;  Surgeon: Algernon Huxley, MD;  Location: [place] [facility];  Service: Cardiovascular;  Laterality: Left;   PERIPHERAL VASCULAR CATHETERIZATION N/A 01/05/2016   Procedure: Lower Extremity Angiography;  Surgeon: Algernon Huxley, MD;  Location: [place] [facility];  Service: Cardiovascular;  Laterality: N/A;   PERIPHERAL VASCULAR CATHETERIZATION  01/05/2016  Procedure: Lower Extremity Intervention;  Surgeon: Algernon Huxley, MD;  Location: [place] [facility];  Service: Cardiovascular;;   PERIPHERAL VASCULAR CATHETERIZATION Right 06/25/2016   Procedure: Lower Extremity Angiography;  Surgeon: Algernon Huxley, MD;  Location: [place] [facility];  Service: Cardiovascular;  Laterality: Right;   PERIPHERAL VASCULAR CATHETERIZATION  06/25/2016    Procedure: Lower Extremity Intervention;  Surgeon: Algernon Huxley, MD;  Location: [place] [facility];  Service: Cardiovascular;;   TONSILLECTOMY     Social History:  reports that she has never smoked. She has never been exposed to tobacco smoke. She has never used smokeless tobacco. She reports that she does not drink alcohol and does not use drugs.  Allergies  Allergen Reactions   Amoxicillin-Pot Clavulanate Rash   Zosyn [Piperacillin Sod-Tazobactam So] Rash    Family History  Problem Relation Age of Onset   Diabetes Other     Prior to Admission medications   Medication Sig Start Date End Date Taking? Authorizing Provider  aspirin 81 MG tablet Take 1 tablet (81 mg total) by mouth daily. 01/06/16  Yes Sudini, Alveta Heimlich, MD  atorvastatin (LIPITOR) 10 MG tablet Take 10 mg every evening by mouth.    Yes [provider]  clopidogrel (PLAVIX) 75 MG tablet TAKE 1 TABLET BY MOUTH EVERY DAY 09/22/20  Yes Kris Hartmann, NP  divalproex (DEPAKOTE ER) 250 MG 24 hr tablet Take 250 mg by mouth daily.   Yes [provider]  lisinopril (ZESTRIL) 10 MG tablet Take 10 mg by mouth in the morning.   Yes [provider]  loratadine (CLARITIN REDITABS) 10 MG dissolvable tablet Take 10 mg by mouth daily as needed for allergies.   Yes [provider]  metFORMIN (GLUCOPHAGE-XR) 500 MG 24 hr tablet Take 500 mg by mouth daily with supper.   Yes [provider]  NOVOLOG MIX 70/30 FLEXPEN (70-30) 100 UNIT/ML FlexPen Inject 22-28 Units into the skin See admin instructions. Inject 28u under the skin ever morning after breakfast and inject 22u under the skin every evening after supper   Yes [provider]  QUEtiapine (SEROQUEL) 50 MG tablet Take 50 mg by mouth every evening.   Yes [provider]  HYDROcodone-acetaminophen (NORCO/VICODIN) 5-325 MG tablet Take 1 tablet by mouth every 4 (four) hours as needed for moderate pain. Patient not taking: Reported on  10/26/2022    [provider]  traMADol (ULTRAM) 50 MG tablet Take 1 tablet (50 mg total) by mouth every 6 (six) hours as needed. Patient not taking: Reported on 10/26/2022 10/22/22   Kris Hartmann, NP    Physical Exam: Vitals:   10/26/22 0950 10/26/22 0952  BP: (!) 121/53   Pulse: 96   Resp: 18   Temp: 97.6 F (36.4 C)   TempSrc: Oral   SpO2: 98% 98%   Physical Exam Vitals and nursing note reviewed.  Constitutional:      Appearance: Normal appearance.  HENT:     Head: Normocephalic and atraumatic.     Nose: Nose normal.     Mouth/Throat:     Mouth: Mucous membranes are moist.  Eyes:     Conjunctiva/sclera: Conjunctivae normal.  Cardiovascular:     Rate and Rhythm: Normal rate and regular rhythm.  Pulmonary:     Effort: Pulmonary effort is normal.     Breath sounds: Normal breath sounds.  Abdominal:     General: Abdomen is flat. Bowel sounds are normal.     Palpations:  Abdomen is soft.  Musculoskeletal:        General: Swelling present.     Cervical back: Normal range of motion and neck supple.     Left lower leg: Edema present.     Comments: Purplish blue discoloration involving the left foot extending to the ankle (stocking distribution).  Prior amputation of toes on the left foot  Skin:    General: Skin is warm and dry.  Neurological:     General: No focal deficit present.     Mental Status: She is alert and oriented to person, place, and time.  Psychiatric:        Mood and Affect: Mood normal.        Behavior: Behavior normal.     Data Reviewed: Relevant notes from primary care and specialist visits, past discharge summaries as available in EHR, including Care Everywhere. Prior diagnostic testing as pertinent to current admission diagnoses Updated medications and problem lists for reconciliation ED course, including vitals, labs, imaging, treatment and response to treatment Triage notes, nursing and pharmacy notes and ED provider's notes Notable  results as noted in HPI Labs reviewed.  Lactic acid 1.5, sodium 138, potassium 4.8, chloride 104, bicarb 22, glucose 262, BUN 35, creatinine 1.53 compared to baseline of 1.15, calcium 9.0, white count 14, hemoglobin 12.3, hematocrit 38.6, platelet count 403 Twelve-lead EKG reviewed by me shows normal sinus rhythm with low voltage QRS There are no new results to review at this time.  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 Patient has been started on a heparin drip Vascular surgery has been consulted  PAD (peripheral artery disease) (HCC) Aspirin and Plavix are currently on hold since patient is on a heparin drip for procedure Continue statins Further treatment plan per vascular surgery  Type 2 diabetes mellitus with chronic kidney disease, with long-term current use of insulin ([place]) Patient has a history of diabetes mellitus with complications of stage III chronic kidney disease Hold metformin Blood sugar checks every 4 hours while NPO with sliding scale coverage  Hypertension Hold lisinopril due to worsening of renal function Monitor renal function closely  AKI (acute kidney injury) ([place]) Unclear etiology Patient has stage III chronic kidney disease secondary to diabetes with a baseline serum creatinine of 1.15% on admission her creatinine is 1.53 Hold lisinopril Continue hydration Repeat renal parameters in a.m.       Advance Care Planning:   Code Status: DNR   Consults: Vascular surgery  Family Communication: Greater than 50% of time was spent discussing patient's condition and plan of care with her daughter Ms. Horris Latino over the phone.  All questions and concerns  have been addressed.  She verbalizes understanding and agrees with the plan.  CODE STATUS was discussed and patient is a DNR.  Severity of Illness: The appropriate patient status for this patient is INPATIENT. Inpatient status is judged to be reasonable and necessary in order to provide the required intensity of service to ensure the patient's safety. The patient's presenting symptoms, physical exam findings, and initial radiographic and laboratory data in the context of their chronic comorbidities is felt to place them at high risk for further clinical deterioration. Furthermore, it is not anticipated that the patient will be medically stable  for discharge from the hospital within 2 midnights of admission.   * I certify that at the point of admission it is my clinical judgment that the patient will require inpatient hospital care spanning beyond 2 midnights from the point of admission due to high intensity of service, high risk for further deterioration and high frequency of surveillance required.*  Author: Collier Bullock, MD 10/26/2022 1:28 PM  For on call review [email or web address].

## 2022-10-26 NOTE — Assessment & Plan Note (Addendum)
Decreased 70/30 insulin 10 units twice a day.  Continue 0-6 units sliding scale Novolog.  Hold Glucophage.  Last hemoglobin A1c 7.4.

## 2022-10-26 NOTE — Consult Note (Signed)
Hospital Consult    Reason for Consult:  Cold Left [place] Requesting Physician:  Dr. Lavonia Drafts MD MRN #:  [id number]  History of Present Illness: This is a 82 y.o. female Past medical history of peripheral vascular disease with stenting done in January 2023, dementia, colon cancer, hypertension, diabetes, who presents to the emergency department with left foot and left lower extremity pain.  Pain radiates from behind the left knee down through the foot.  There is some new discoloration of the left ankle and foot but the patient states started this morning after removing the sock discovered that the left foot was blue, states that this was new.   She denies chest pain, shortness of breath, fever or any other acute medical complaints. She states she has not eaten today. Confirmed with Daughter on the phone.   Past Medical History:  Diagnosis Date   Anxiety    Colon cancer ([place])    Dementia ([place]) 2022   Diabetes ([place])    Hypertension    Peripheral vascular disease ([place])     Past Surgical History:  Procedure Laterality Date   AMPUTATION Right 01/14/2016   Procedure: AMPUTATION RAY;  Surgeon: Albertine Patricia, DPM;  Location: [facility];  Service: Podiatry;  Laterality: Right;   AMPUTATION Left 11/26/2021   Procedure: AMPUTATION FIRST RAY;  Surgeon: Sharlotte Alamo, DPM;  Location: [facility];  Service: Podiatry;  Laterality: Left;   AMPUTATION TOE Left 02/11/2022   Procedure: Left Second Toe Amputation;  Surgeon: Samara Deist, DPM;  Location: [facility];  Service: Podiatry;  Laterality: Left;   AMPUTATION TOE Left 05/30/2022   Procedure: AMPUTATION TOE , LEFT THIRD TOE;  Surgeon: Sharlotte Alamo, DPM;  Location: [facility];  Service: Podiatry;  Laterality: Left;   COLON SURGERY     LOWER EXTREMITY ANGIOGRAPHY Left 08/14/2017   Procedure: Lower Extremity Angiography;  Surgeon: Algernon Huxley, MD;  Location: [place] [facility];  Service: Cardiovascular;  Laterality: Left;   LOWER EXTREMITY  ANGIOGRAPHY Left 11/06/2021   Procedure: LOWER EXTREMITY ANGIOGRAPHY;  Surgeon: Algernon Huxley, MD;  Location: [place] [facility];  Service: Cardiovascular;  Laterality: Left;   PERIPHERAL VASCULAR CATHETERIZATION N/A 01/05/2016   Procedure: Lower Extremity Angiography;  Surgeon: Algernon Huxley, MD;  Location: [place] [facility];  Service: Cardiovascular;  Laterality: N/A;   PERIPHERAL VASCULAR CATHETERIZATION  01/05/2016   Procedure: Lower Extremity Intervention;  Surgeon: Algernon Huxley, MD;  Location: [place] [facility];  Service: Cardiovascular;;   PERIPHERAL VASCULAR CATHETERIZATION Right 06/25/2016   Procedure: Lower Extremity Angiography;  Surgeon: Algernon Huxley, MD;  Location: [place] [facility];  Service: Cardiovascular;  Laterality: Right;   PERIPHERAL VASCULAR CATHETERIZATION  06/25/2016   Procedure: Lower Extremity Intervention;  Surgeon: Algernon Huxley, MD;  Location: [place] [facility];  Service: Cardiovascular;;   TONSILLECTOMY      Allergies  Allergen Reactions   Amoxicillin-Pot Clavulanate Rash   Zosyn [Piperacillin Sod-Tazobactam So] Rash    Prior to Admission medications   Medication Sig Start Date End Date Taking? Authorizing Provider  aspirin 81 MG tablet Take 1 tablet (81 mg total) by mouth daily. 01/06/16  Yes Sudini, Alveta Heimlich, MD  atorvastatin (LIPITOR) 10 MG tablet Take 10 mg every evening by mouth.    Yes [provider]  clopidogrel (PLAVIX) 75 MG tablet TAKE 1 TABLET BY MOUTH EVERY DAY 09/22/20  Yes Kris Hartmann, NP  divalproex (DEPAKOTE ER) 250 MG 24 hr tablet Take  250 mg by mouth daily.   Yes [provider]  lisinopril (ZESTRIL) 10 MG tablet Take 10 mg by mouth in the morning.   Yes [provider]  loratadine (CLARITIN REDITABS) 10 MG dissolvable tablet Take 10 mg by mouth daily as needed for allergies.   Yes [provider]  metFORMIN (GLUCOPHAGE-XR) 500 MG 24 hr tablet Take 500 mg by mouth daily with supper.   Yes [provider]  NOVOLOG MIX 70/30 FLEXPEN (70-30) 100 UNIT/ML FlexPen Inject 22-28 Units into the skin See admin instructions. Inject 28u under the skin ever morning after breakfast and inject 22u under the skin every evening after supper   Yes [provider]  QUEtiapine (SEROQUEL) 50 MG tablet Take 50 mg by mouth every evening.   Yes [provider]  HYDROcodone-acetaminophen (NORCO/VICODIN) 5-325 MG tablet Take 1 tablet by mouth every 4 (four) hours as needed for moderate pain. Patient not taking: Reported on 10/26/2022    [provider]  traMADol (ULTRAM) 50 MG tablet Take 1 tablet (50 mg total) by mouth every 6 (six) hours as needed. Patient not taking: Reported on 10/26/2022 10/22/22   Kris Hartmann, NP    Social History   Socioeconomic History   Marital status: Widowed    Spouse name: Not on file   Number of children: 3   Years of education: Not on file   Highest education level: Not on file  Occupational History   Not on file  Tobacco Use   Smoking status: Never    Passive exposure: Never   Smokeless tobacco: Never  Substance and Sexual Activity   Alcohol use: No    Alcohol/week: 0.0 standard drinks of alcohol   Drug use: No   Sexual activity: Not Currently  Other Topics Concern   Not on file  Social History Narrative   Daughter And her 2 kids    lives with pt.    Social Determinants of Health   Financial Resource Strain: Not on file  Food Insecurity: No Food Insecurity (07/13/2022)   Hunger Vital Sign    Worried About Running Out of Food in the Last Year: Never true    Ran Out of Food in the Last Year: Never true  Transportation Needs: No Transportation Needs (07/13/2022)   PRAPARE - Hydrologist (Medical): No    Lack of Transportation (Non-Medical): No  Physical Activity: Not on file  Stress: Not on file  Social Connections: Not on file  Intimate Partner Violence: Not At Risk (07/13/2022)   Humiliation,  Afraid, Rape, and Kick questionnaire    Fear of Current or Ex-Partner: No    Emotionally Abused: No    Physically Abused: No    Sexually Abused: No     Family History  Problem Relation Age of Onset   Diabetes Other     ROS: Otherwise negative unless mentioned in HPI  Physical Examination  Vitals:   10/26/22 0950 10/26/22 0952  BP: (!) 121/53   Pulse: 96   Resp: 18   Temp: 97.6 F (36.4 C)   SpO2: 98% 98%   There is no height or weight on file to calculate BMI.  General:  WDWN in NAD Gait: Not observed HENT: WNL, normocephalic Pulmonary: normal non-labored breathing, without Rales, rhonchi,  wheezing Cardiac: regular, without  Murmurs, rubs or gallops; without carotid bruits Abdomen: Positive bowel sounds, soft, NT/ND, no masses Skin: without rashes Vascular Exam/Pulses: Non Palpable post tibial  and dorsalis pedis pulses  Extremities: with ischemic changes, without Gangrene , with cellulitis; with open wounds;  Musculoskeletal: no muscle wasting or atrophy  Neurologic: A&O X 3;  No focal weakness or paresthesias are detected; speech is fluent/normal Psychiatric:  The pt has Normal affect. Lymph:  Unremarkable  CBC    Component Value Date/Time   WBC 11.0 (H) 07/21/2022 1327   RBC 3.86 (L) 07/21/2022 1327   HGB 11.2 (L) 07/21/2022 1327   HGB 13.2 12/04/2013 0953   HCT 35.7 (L) 07/21/2022 1327   HCT 41.0 12/04/2013 0953   PLT 519 (H) 07/21/2022 1327   PLT 324 12/04/2013 0953   MCV 92.5 07/21/2022 1327   MCV 85 12/04/2013 0953   MCH 29.0 07/21/2022 1327   MCHC 31.4 07/21/2022 1327   RDW 13.7 07/21/2022 1327   RDW 14.1 12/04/2013 0953   LYMPHSABS 3.3 07/21/2022 1327   LYMPHSABS 2.3 12/04/2013 0953   MONOABS 0.4 07/21/2022 1327   MONOABS 0.4 12/04/2013 0953   EOSABS 0.2 07/21/2022 1327   EOSABS 0.2 12/04/2013 0953   BASOSABS 0.1 07/21/2022 1327   BASOSABS 0.1 12/04/2013 0953    BMET    Component Value Date/Time   NA 135 07/21/2022 1327   NA 142  11/13/2011 1110   K 5.1 07/21/2022 1327   K 4.6 11/13/2011 1110   CL 107 07/21/2022 1327   CL 106 11/13/2011 1110   CO2 23 07/21/2022 1327   CO2 28 11/13/2011 1110   GLUCOSE 220 (H) 07/21/2022 1327   GLUCOSE 123 (H) 11/13/2011 1110   BUN 17 07/21/2022 1327   BUN 12 11/13/2011 1110   CREATININE 1.15 (H) 07/21/2022 1327   CREATININE 1.10 12/04/2013 0953   CALCIUM 8.7 (L) 07/21/2022 1327   CALCIUM 8.7 11/13/2011 1110   GFRNONAA 47 (L) 07/21/2022 1327   GFRNONAA 49 (L) 12/04/2013 0953   GFRAA 54 (L) 08/14/2017 0912   GFRAA 57 (L) 12/04/2013 0953    COAGS: Lab Results  Component Value Date   INR 1.1 05/30/2022     Non-Invasive Vascular Imaging:   EXAM: LEFT LOWER EXTREMITY VENOUS DOPPLER ULTRASOUND  IMPRESSION: Persistent isolated calf DVT without evidence of propagation into the popliteal vein.  Statin:  No. Beta Blocker:  No. Aspirin:  Yes.   ACEI:  No. ARB:  No. CCB use:  No Other antiplatelets/anticoagulants:  Yes.   ASA and Plavix   ASSESSMENT/PLAN: This is a 82 y.o. female with a history of diabetes, peripheral arterial disease with reported stents in her lower extremity who presents with complaints of left-sided calf pain. Patient reports this morning when she went to get out of bed her left calf was hurting whenever she moved it but it felt fine when she held it still.   Plan: Patient needs an angiogram of the left lower extremity.  Known history of tibial DVT.  Patient's foot is very cold to the touch today and erythemic and purple.  Patient has pain at rest.  Patient has history of surgical removal of toes on the left foot.  Discussed in detail with her today the procedure, benefits, risks, and complications.  All the patient's questions.  Patient verbalizes she would like to proceed with the procedure to help her left lower foot.  Patient has been n.p.o. since 8:00 last night.      Drema P[facility] 10/26/2022 11:03 AM

## 2022-10-26 NOTE — ED Triage Notes (Signed)
Patient to ED via [facility] from home for left foot pain. Patient does have a blood clot and currently on blood thinners. Foot is purple/blue and swollen which is new. Does have 3 stents in leg. AOx4

## 2022-10-27 ENCOUNTER — Inpatient Hospital Stay: Payer: Medicare HMO

## 2022-10-27 DIAGNOSIS — N179 Acute kidney failure, unspecified: Secondary | ICD-10-CM

## 2022-10-27 DIAGNOSIS — N189 Chronic kidney disease, unspecified: Secondary | ICD-10-CM

## 2022-10-27 DIAGNOSIS — I739 Peripheral vascular disease, unspecified: Secondary | ICD-10-CM | POA: Diagnosis not present

## 2022-10-27 DIAGNOSIS — E1122 Type 2 diabetes mellitus with diabetic chronic kidney disease: Secondary | ICD-10-CM

## 2022-10-27 DIAGNOSIS — I70222 Atherosclerosis of native arteries of extremities with rest pain, left leg: Secondary | ICD-10-CM | POA: Diagnosis not present

## 2022-10-27 DIAGNOSIS — Z794 Long term (current) use of insulin: Secondary | ICD-10-CM

## 2022-10-27 DIAGNOSIS — J9601 Acute respiratory failure with hypoxia: Secondary | ICD-10-CM | POA: Diagnosis present

## 2022-10-27 DIAGNOSIS — N1831 Chronic kidney disease, stage 3a: Secondary | ICD-10-CM

## 2022-10-27 DIAGNOSIS — I10 Essential (primary) hypertension: Secondary | ICD-10-CM

## 2022-10-27 LAB — GLUCOSE, CAPILLARY
Glucose-Capillary: 158 mg/dL — ABNORMAL HIGH (ref 70–99)
Glucose-Capillary: 187 mg/dL — ABNORMAL HIGH (ref 70–99)
Glucose-Capillary: 313 mg/dL — ABNORMAL HIGH (ref 70–99)
Glucose-Capillary: 274 mg/dL — ABNORMAL HIGH (ref 70–99)
Glucose-Capillary: 296 mg/dL — ABNORMAL HIGH (ref 70–99)

## 2022-10-27 LAB — BASIC METABOLIC PANEL
Anion gap: 9 (ref 5–15)
BUN: 36 mg/dL — ABNORMAL HIGH (ref 8–23)
CO2: 19 mmol/L — ABNORMAL LOW (ref 22–32)
Calcium: 8 mg/dL — ABNORMAL LOW (ref 8.9–10.3)
Chloride: 108 mmol/L (ref 98–111)
Creatinine, Ser: 1.47 mg/dL — ABNORMAL HIGH (ref 0.44–1.00)
GFR, Estimated: 35 mL/min — ABNORMAL LOW (ref >60)
Glucose, Bld: 227 mg/dL — ABNORMAL HIGH (ref 70–99)
Potassium: 3.9 mmol/L (ref 3.5–5.1)
Sodium: 136 mmol/L (ref 135–145)

## 2022-10-27 LAB — CBC
HCT: 31.5 % — ABNORMAL LOW (ref 36.0–46.0)
Hemoglobin: 10.3 g/dL — ABNORMAL LOW (ref 12.0–15.0)
MCH: 29.3 pg (ref 26.0–34.0)
MCHC: 32.7 g/dL (ref 30.0–36.0)
MCV: 89.5 fL (ref 80.0–100.0)
Platelets: 353 10*3/uL (ref 150–400)
RBC: 3.52 MIL/uL — ABNORMAL LOW (ref 3.87–5.11)
RDW: 13.5 % (ref 11.5–15.5)
WBC: 15.9 10*3/uL — ABNORMAL HIGH (ref 4.0–10.5)
nRBC: 0 % (ref 0.0–0.2)

## 2022-10-27 LAB — HEPARIN LEVEL (UNFRACTIONATED): Heparin Unfractionated: <0.1 IU/mL — ABNORMAL LOW (ref 0.30–0.70)

## 2022-10-27 MED ORDER — HEPARIN (PORCINE) 25000 UT/250ML-% IV SOLN
1850.0000 [IU]/h | INTRAVENOUS | Status: AC
  Administered 2022-10-27: 1450 [IU]/h via INTRAVENOUS
  Administered 2022-10-27: 1200 [IU]/h via INTRAVENOUS
  Administered 2022-10-28 – 2022-10-29 (×2): 1450 [IU]/h via INTRAVENOUS
  Administered 2022-10-31: 1850 [IU]/h via INTRAVENOUS
  Filled 2022-10-27 (×6): qty 250

## 2022-10-27 MED ORDER — INSULIN ASPART PROT & ASPART (70-30 MIX) 100 UNIT/ML ~~LOC~~ SUSP
20.0000 [IU] | Freq: Two times a day (BID) | SUBCUTANEOUS | Status: DC
  Administered 2022-10-27 – 2022-10-29 (×4): 20 [IU] via SUBCUTANEOUS
  Filled 2022-10-27: qty 10

## 2022-10-27 MED ORDER — OXYCODONE HCL 5 MG PO TABS
5.0000 mg | ORAL_TABLET | ORAL | Status: DC | PRN
  Administered 2022-10-28 – 2022-10-31 (×3): 5 mg via ORAL
  Filled 2022-10-27 (×3): qty 1

## 2022-10-27 MED ORDER — HEPARIN BOLUS VIA INFUSION
2000.0000 [IU] | Freq: Once | INTRAVENOUS | Status: AC
  Administered 2022-10-27: 2000 [IU] via INTRAVENOUS
  Filled 2022-10-27: qty 2000

## 2022-10-27 MED ORDER — LEVOFLOXACIN IN D5W 750 MG/150ML IV SOLN
750.0000 mg | INTRAVENOUS | Status: DC
  Administered 2022-10-28 – 2022-10-29 (×2): 750 mg via INTRAVENOUS
  Filled 2022-10-27 (×2): qty 150

## 2022-10-27 MED ORDER — SODIUM CHLORIDE 0.9 % IV BOLUS
250.0000 mL | Freq: Once | INTRAVENOUS | Status: AC
  Administered 2022-10-27: 250 mL via INTRAVENOUS

## 2022-10-27 MED ORDER — ORAL CARE MOUTH RINSE: 15.0000 mL | OROMUCOSAL | Status: DC | PRN

## 2022-10-27 NOTE — Consult Note (Signed)
  ANTICOAGULATION CONSULT NOTE - Initial Consult  Pharmacy Consult for Heparin infusion Indication: DVT and limb ischemia  Allergies  Allergen Reactions   Amoxicillin-Pot Clavulanate Rash   Zosyn [Piperacillin Sod-Tazobactam So] Rash    Patient Measurements: Height: '5\' 2"'$  (157.5 cm) Weight: 70.2 kg (154 lb 12.2 oz) IBW/kg (Calculated) : 50.1 Heparin Dosing Weight: 66.3 kg  Vital Signs: Temp: 99 F (37.2 C) (01/20 0335) Temp Source: Oral (01/20 0335) BP: 132/51 (01/20 0335) Pulse Rate: 87 (01/20 0335)  Labs: Recent Labs    10/26/22 1042 10/26/22 1925 10/27/22 0651  HGB 12.3  --  10.3*  HCT 38.6  --  31.5*  PLT 403*  --  353  APTT  --  109*  --   LABPROT 14.9  --   --   INR 1.2  --   --   CREATININE 1.53*  --  1.47*     Estimated Creatinine Clearance: 26.6 mL/min (A) (by C-G formula based on SCr of 1.47 mg/dL (H)).   Medical History: Past Medical History:  Diagnosis Date   Anxiety    Colon cancer ([place])    Dementia ([place]) 2022   Diabetes ([place])    Hypertension    Peripheral vascular disease ([place])     Medications:  No history of chronic anticoagulant use PTA  Assessment: 82 y.o. 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 Patient was seen in the ER on 10/19/22 for evaluation of left leg pain and had an ultrasound which showed a left tibial vein DVT.  She was discharged home and advised to follow-up with vascular surgery who continued her dual antiplatelet therapy and did not place patient on anticoagulation due to her age and increased risk of falls. Limb is now with purplish/blue discoloration extending from left foot to the ankle. Vascular  has been consulted, planning for left lower extremity angiography. Pharmacy has been consulted to initiate and titrate heparin infusion.   Goal of Therapy:  Heparin level 0.3-0.7 units/ml Monitor platelets by anticoagulation protocol: Yes   Plan:  Tirofiban to stop at 08:00. Per Dr. Delana Meyer start heparin after tirofiban is completed. Will start heparin at 1200 units/hr. Check heparin level in 8 hours. CBC daily while on heparin.   Oswald Hillock, PharmD, BCPS 10/27/2022,7:49 AM

## 2022-10-27 NOTE — Progress Notes (Signed)
Skin color slowing returning to left heel and side of left foot. Pedal and Tibialis pulses of left heel audible via doppler and pedal pulse is now present via doppler yet faint. Left Foot remains purple and cool to touch. Left foot elevated on pilled, heel off of the bed/pillow. Right femoral remained a Level 0 with no s/s of bleeding. Patient confused, noncompliant and agitated and now in mitts as she continued to remove telemetry wires, gown, pulled on IV and tubing, removed purewick, and removed oxygen.

## 2022-10-27 NOTE — Assessment & Plan Note (Addendum)
Patient had a pulse ox of 89% on 4 L of oxygen.  Weaned off O2, stable sats on room air

## 2022-10-27 NOTE — Consult Note (Signed)
  ANTICOAGULATION CONSULT NOTE - Initial Consult  Pharmacy Consult for Heparin infusion Indication: DVT and limb ischemia  Allergies  Allergen Reactions   Amoxicillin-Pot Clavulanate Rash   Zosyn [Piperacillin Sod-Tazobactam So] Rash    Patient Measurements: Height: '5\' 2"'$  (157.5 cm) Weight: 70.2 kg (154 lb 12.2 oz) IBW/kg (Calculated) : 50.1 Heparin Dosing Weight: 66.3 kg  Vital Signs: Temp: 97.5 F (36.4 C) (01/20 1734) BP: 133/98 (01/20 1734) Pulse Rate: 91 (01/20 1734)  Labs: Recent Labs    10/26/22 1042 10/26/22 1925 10/27/22 0651 10/27/22 1725  HGB 12.3  --  10.3*  --   HCT 38.6  --  31.5*  --   PLT 403*  --  353  --   APTT  --  109*  --   --   LABPROT 14.9  --   --   --   INR 1.2  --   --   --   HEPARINUNFRC  --   --   --  <0.10*  CREATININE 1.53*  --  1.47*  --      Estimated Creatinine Clearance: 26.6 mL/min (A) (by C-G formula based on SCr of 1.47 mg/dL (H)).   Medical History: Past Medical History:  Diagnosis Date   Anxiety    Colon cancer ([place])    Dementia ([place]) 2022   Diabetes ([place])    Hypertension    Peripheral vascular disease ([place])     Medications:  No history of chronic anticoagulant use PTA  Assessment: 82 y.o. 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 Patient was seen in the ER on 10/19/22 for evaluation of left leg pain and had an ultrasound which showed a left tibial vein DVT.  She was discharged home and advised to follow-up with vascular surgery who continued her dual antiplatelet therapy and did not place patient on anticoagulation due to her age and increased risk of falls. Limb is now with purplish/blue  discoloration extending from left foot to the ankle. Vascular has been consulted, planning for left lower extremity angiography. Pharmacy has been consulted to initiate and titrate heparin infusion.   Goal of Therapy:  Heparin level 0.3-0.7 units/ml Monitor platelets by anticoagulation protocol: Yes   Plan:  Tirofiban to stop at 08:00. Per Dr. Delana Meyer start heparin after tirofiban is completed. Will start heparin at 1200 units/hr. Check heparin level in 8 hours. CBC daily while on heparin.   10/27/22 '@1725'$ : heparin level < 0.10. subtherapeutic. Give heparin bolus 2,000 units and increase heparin infusion to 1450 units/hr Heparin level 8 hours after rate change  CBC daily   Wynelle Cleveland, PharmD, BCPS 10/27/2022,7:27 PM

## 2022-10-27 NOTE — Hospital Course (Addendum)
82 y.o. 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 Patient was seen in the ER on 10/19/22 for evaluation of left leg pain and had an ultrasound which showed a left tibial vein DVT.  She was discharged home and advised to follow-up with vascular surgery who continued her dual antiplatelet therapy and did not place patient on anticoagulation due to her age and increased risk of falls. Daughter notes that she complained of worsening back pain on the day of admission and she took off her sock and noted that her left foot had purplish blue discoloration extending from the left foot to the ankle and so she called EMS and patient was brought into the ER for further evaluation. She rates the pain in her leg a 10 x 10 in intensity at its worst.... Abnormal labs include glucose of 262, white count 14.0, platelet count 403, serum creatinine 1.53 above a baseline of 1.15  Dr. Delana Meyer took to the vascular lab on 10/26/2022.  Angioplasty of the left superficial femoral and popliteal arteries, angioplasty and stent of left posterior tibial, mechanical thrombectomy of the entire SFA and popliteal using Rota Rex device.  1/20.  Left foot demarcated from the low sock line down.  Continue heparin drip.  Chest x-ray showing pneumonia started Levaquin. 1/21.  Had fever last night.  Will get blood cultures today.  Add vancomycin. 1/22.  Patient complaining of right-sided weakness.  Stat CT scan of the head was negative.  MRI of the brain was negative for acute stroke. 1/23.  Case discussed with vascular team.  They are not going to do any further interventions at this time and recommended continued care  consult for her foot.  They are okay to switch over to Eliquis for tomorrow and recommended triple blood thinners.  1/24 >> 1/26 - met with daughter at bedside 1/24, she indicated wish to take patient home with hospice.  Hospice liaison was informed and patient was accepted to their service.  She remains stable on Eliquis in addition to DAPT.    Patient is stable for discharge home with hospice today.  Comfort medications sent to her pharmacy as requested.  Confirmed DME including hospital bed were delivered.

## 2022-10-27 NOTE — Consult Note (Signed)
Pharmacy Antibiotic Note  Beth Mcguire is a 82 y.o. female admitted on 10/26/2022 with pneumonia.  Pharmacy has been consulted for levofloxacin dosing.   Assessment:  Pt hypoxic. CXR reveals heterogeneous opacities left mid and lower lung may represent atelectasis or infection.  AoCKD- Scr 1.47 (Baseline ~1.15).  Plan: Levofloxacin 750 mg IV every 48 hours  Height: '5\' 2"'$  (157.5 cm) Weight: 70.2 kg (154 lb 12.2 oz) IBW/kg (Calculated) : 50.1  Temp (24hrs), Avg:99 F (37.2 C), Min:97.5 F (36.4 C), Max:100.4 F (38 C)  Recent Labs  Lab 10/26/22 1042 10/26/22 1225 10/27/22 0651  WBC 14.0*  --  15.9*  CREATININE 1.53*  --  1.47*  LATICACIDVEN 1.6 1.5  --     Estimated Creatinine Clearance: 26.6 mL/min (A) (by C-G formula based on SCr of 1.47 mg/dL (H)).    Allergies  Allergen Reactions   Amoxicillin-Pot Clavulanate Rash   Zosyn [Piperacillin Sod-Tazobactam So] Rash    Antimicrobials this admission: Levofloxacin 1/20 >>  Cipro 1/19 x 1 dose pre-op  Dose adjustments this admission: N/A  Microbiology results: N/A  Thank you for allowing pharmacy to be a part of this patient's care.  Alison Murray 10/27/2022 8:33 PM

## 2022-10-27 NOTE — Progress Notes (Signed)
Progress Note   Patient: Beth Mcguire [id number] DOB: 08-10-1941 DOA: 10/26/2022     1 DOS: the patient was seen and examined on 10/27/2022   Brief hospital course: 82 y.o. 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 Patient was seen in the ER on 10/19/22 for evaluation of left leg pain and had an ultrasound which showed a left tibial vein DVT.  She was discharged home and advised to follow-up with vascular surgery who continued her dual antiplatelet therapy and did not place patient on anticoagulation due to her age and increased risk of falls. Daughter notes that she complained of worsening back pain on the day of admission and she took off her sock and noted that her left foot had purplish blue discoloration extending from the left foot to the ankle and so she called EMS and patient was brought into the ER for further evaluation. She rates the pain in her leg a 10 x 10 in intensity at its worst. She denies having any chest pain, no shortness of breath, no nausea, no vomiting, no dizziness, no lightheadedness, no headache, no urinary symptoms, no blurred vision no focal deficit Abnormal labs include glucose of 262, white count 14.0, platelet count 403, serum creatinine 1.53 above a baseline of 1.15  Dr. Delana Meyer took to the vascular lab on 10/26/2022.  Angioplasty of the left superficial femoral and popliteal arteries, angioplasty and stent of left posterior tibial, mechanical thrombectomy of the entire SFA and popliteal using Rota Rex device.  1/20.  Left foot demarcated from the low sock line down.  Continue heparin drip.  Assessment and Plan: * Critical limb ischemia  of left lower extremity ([place]) Status post procedure yesterday by Dr. Delana Meyer to try to obtain the best circulation to her left foot.  Left foot demarcated from the low sock line down.  High risk for amputation.  Continue heparin drip.  Acute kidney injury superimposed on CKD ([place]) AKI on CKD stage III A.  Baseline creatinine around 1.15.  She came in at 1.53.  Today's creatinine 1.47.  Will give a fluid bolus today.  Hold lisinopril.   PAD (peripheral artery disease) (HCC) Continue heparin drip.  Type 2 diabetes mellitus with chronic kidney disease, with long-term current use of insulin (HCC) Restart 70/30 insulin 20 units twice a day.  Hold Glucophage.  Last hemoglobin A1c 7.4.  Hypertension Hold lisinopril  Acute hypoxic respiratory failure ([place]) Obtain a chest x-ray.  Patient had a pulse ox of 89% on 4 L of oxygen.  Currently on 2 L of oxygen and saturation is 92%.        Subjective: Patient admitted with limb threatening ischemia of her left foot.  Patient had vascular procedure last night.  Foot demarcated from a low sock line down.  Physical Exam: Vitals:   10/27/22 0335 10/27/22 0821 10/27/22 1208 10/27/22 1300  BP: (!) 132/51 (!) 89/62 (!) 141/64   Pulse: 87 77 93   Resp: '18 16 16   '$ Temp: 99 F (37.2 C) 98.7 F (37.1 C) 99.4 F (37.4 C)   TempSrc: Oral     SpO2: 99% (!) 88% (!) 89% 92%  Weight:      Height:  Physical Exam HENT:     Head: Normocephalic.     Mouth/Throat:     Pharynx: No oropharyngeal exudate.  Eyes:     General: Lids are normal.     Conjunctiva/sclera: Conjunctivae normal.  Cardiovascular:     Rate and Rhythm: Normal rate and regular rhythm.     Heart sounds: Normal heart sounds, S1 normal and S2 normal.  Pulmonary:     Breath sounds: No decreased breath sounds, wheezing, rhonchi or rales.  Abdominal:     Palpations: Abdomen is soft.     Tenderness: There is no abdominal tenderness.  Musculoskeletal:     Right lower leg: No  swelling.     Left lower leg: No swelling.  Skin:    General: Skin is warm.     Comments: Left foot demarcated purpleish reddish From a low sock line down.  Neurological:     Mental Status: She is alert.     Comments: Answers questions appropriately and was feeding herself breakfast.     Data Reviewed: Creatinine 1.47, white blood cell count 15.9, hemoglobin 10.3, platelet count 353  Family Communication: Updated patient's daughter on the phone  Disposition: Status is: Inpatient Remains inpatient appropriate because: Watching left foot demarcate.  High risk for amputation.  Continue heparin drip  Planned Discharge Destination: Home    Time spent: 28 minutes  Author: Loletha Grayer, MD 10/27/2022 2:50 PM  For on call review [email or web address].

## 2022-10-27 NOTE — Progress Notes (Signed)
Daily Progress Note   Assessment/Planning:   POD #1 s/p L PTA PTA+S, Mech TE SFA/pop  Access site without complication No further revascularization options per Dr. Ronalee Belts Dr. Ronalee Belts will be back on Monday   Subjective  - 1 Day Post-Op   No complications, confusion overnight   Objective   Vitals:   10/26/22 1842 10/26/22 2122 10/26/22 2306 10/27/22 0335  BP: (!) 194/59 (!) 163/46 (!) 166/45 (!) 132/51  Pulse: (!) 115 92 97 87  Resp: '18 17 16 18  '$ Temp: 98 F (36.7 C) (!) 100.4 F (38 C) 99.1 F (37.3 C) 99 F (37.2 C)  TempSrc:  Oral Oral Oral  SpO2: 100% 100% 100% 99%  Weight:  70.2 kg    Height:  '5\' 2"'$  (1.575 m)       Intake/Output Summary (Last 24 hours) at 10/27/2022 0740 Last data filed at 10/27/2022 0644 Gross per 24 hour  Intake 1908.09 ml  Output --  Net 1908.09 ml    VASC R groin: no hematoma, no active bleeding, inflated compression device removed; R DP dopplerable, faintly dopplerable L PTA    Laboratory   CBC    Latest Ref Rng & Units 10/27/2022    6:51 AM 10/26/2022   10:42 AM 07/21/2022    1:27 PM  CBC  WBC 4.0 - 10.5 K/uL 15.9  14.0  11.0   Hemoglobin 12.0 - 15.0 g/dL 10.3  12.3  11.2   Hematocrit 36.0 - 46.0 % 31.5  38.6  35.7   Platelets 150 - 400 K/uL 353  403  519     BMET    Component Value Date/Time   NA 136 10/27/2022 0651   NA 142 11/13/2011 1110   K 3.9 10/27/2022 0651   K 4.6 11/13/2011 1110   CL 108 10/27/2022 0651   CL 106 11/13/2011 1110   CO2 19 (L) 10/27/2022 0651   CO2 28 11/13/2011 1110   GLUCOSE 227 (H) 10/27/2022 0651   GLUCOSE 123 (H) 11/13/2011 1110   BUN 36 (H) 10/27/2022 0651   BUN 12 11/13/2011 1110   CREATININE 1.47 (H) 10/27/2022 0651   CREATININE 1.10 12/04/2013 0953   CALCIUM 8.0 (L) 10/27/2022 0651   CALCIUM 8.7 11/13/2011 1110   GFRNONAA 35 (L) 10/27/2022 0651   GFRNONAA 49 (L) 12/04/2013 0953   GFRAA 54 (L) 08/14/2017 0912   GFRAA 57 (L) 12/04/2013 7622     Adele Barthel, MD, FACS,  [facility] Covering for [facility]: [phone]  10/27/2022, 7:40 AM

## 2022-10-28 DIAGNOSIS — I70222 Atherosclerosis of native arteries of extremities with rest pain, left leg: Secondary | ICD-10-CM | POA: Diagnosis not present

## 2022-10-28 DIAGNOSIS — R000 Tachycardia, unspecified: Secondary | ICD-10-CM | POA: Diagnosis present

## 2022-10-28 DIAGNOSIS — J9601 Acute respiratory failure with hypoxia: Secondary | ICD-10-CM | POA: Diagnosis not present

## 2022-10-28 DIAGNOSIS — J181 Lobar pneumonia, unspecified organism: Secondary | ICD-10-CM

## 2022-10-28 DIAGNOSIS — N179 Acute kidney failure, unspecified: Secondary | ICD-10-CM | POA: Diagnosis not present

## 2022-10-28 LAB — CBC
HCT: 28.4 % — ABNORMAL LOW (ref 36.0–46.0)
Hemoglobin: 9.1 g/dL — ABNORMAL LOW (ref 12.0–15.0)
MCH: 28.4 pg (ref 26.0–34.0)
MCHC: 32 g/dL (ref 30.0–36.0)
MCV: 88.8 fL (ref 80.0–100.0)
Platelets: 303 10*3/uL (ref 150–400)
RBC: 3.2 MIL/uL — ABNORMAL LOW (ref 3.87–5.11)
RDW: 13.3 % (ref 11.5–15.5)
WBC: 18.4 10*3/uL — ABNORMAL HIGH (ref 4.0–10.5)
nRBC: 0 % (ref 0.0–0.2)

## 2022-10-28 LAB — HEPARIN LEVEL (UNFRACTIONATED)
Heparin Unfractionated: 0.44 IU/mL (ref 0.30–0.70)
Heparin Unfractionated: 0.43 IU/mL (ref 0.30–0.70)

## 2022-10-28 LAB — GLUCOSE, CAPILLARY
Glucose-Capillary: 128 mg/dL — ABNORMAL HIGH (ref 70–99)
Glucose-Capillary: 145 mg/dL — ABNORMAL HIGH (ref 70–99)
Glucose-Capillary: 149 mg/dL — ABNORMAL HIGH (ref 70–99)
Glucose-Capillary: 217 mg/dL — ABNORMAL HIGH (ref 70–99)
Glucose-Capillary: 253 mg/dL — ABNORMAL HIGH (ref 70–99)
Glucose-Capillary: 277 mg/dL — ABNORMAL HIGH (ref 70–99)

## 2022-10-28 MED ORDER — VANCOMYCIN HCL 1500 MG/300ML IV SOLN
1500.0000 mg | Freq: Once | INTRAVENOUS | Status: AC
  Administered 2022-10-28: 1500 mg via INTRAVENOUS
  Filled 2022-10-28: qty 300

## 2022-10-28 MED ORDER — SODIUM CHLORIDE 0.9 % IV SOLN: INTRAVENOUS | Status: DC

## 2022-10-28 MED ORDER — METOPROLOL TARTRATE 5 MG/5ML IV SOLN: 5.0000 mg | INTRAVENOUS | Status: DC | PRN

## 2022-10-28 MED ORDER — VANCOMYCIN VARIABLE DOSE PER UNSTABLE RENAL FUNCTION (PHARMACIST DOSING): Status: DC

## 2022-10-28 MED ORDER — METOPROLOL SUCCINATE ER 25 MG PO TB24
25.0000 mg | ORAL_TABLET | Freq: Every day | ORAL | Status: DC
  Administered 2022-10-28: 25 mg via ORAL
  Filled 2022-10-28: qty 1

## 2022-10-28 MED ORDER — SODIUM CHLORIDE 0.9 % IV BOLUS
500.0000 mL | Freq: Once | INTRAVENOUS | Status: AC
  Administered 2022-10-28: 500 mL via INTRAVENOUS

## 2022-10-28 NOTE — Consult Note (Signed)
ANTICOAGULATION CONSULT NOTE   Pharmacy Consult for Heparin infusion Indication: DVT and limb ischemia  Allergies  Allergen Reactions   Amoxicillin-Pot Clavulanate Rash   Zosyn [Piperacillin Sod-Tazobactam So] Rash    Patient Measurements: Height: '5\' 2"'$  (157.5 cm) Weight: 70.2 kg (154 lb 12.2 oz) IBW/kg (Calculated) : 50.1 Heparin Dosing Weight: 66.3 kg  Vital Signs: Temp: 97.8 F (36.6 C) (01/21 1220) Temp Source: Oral (01/21 1220) BP: 107/41 (01/21 1220) Pulse Rate: 96 (01/21 1220)  Labs: Recent Labs    10/26/22 1042 10/26/22 1925 10/27/22 0651 10/27/22 1725 10/28/22 0512 10/28/22 1249  HGB 12.3  --  10.3*  --  9.1*  --   HCT 38.6  --  31.5*  --  28.4*  --   PLT 403*  --  353  --  303  --   APTT  --  109*  --   --   --   --   LABPROT 14.9  --   --   --   --   --   INR 1.2  --   --   --   --   --   HEPARINUNFRC  --   --   --  <0.10* 0.44 0.43  CREATININE 1.53*  --  1.47*  --   --   --      Estimated Creatinine Clearance: 26.6 mL/min (A) (by C-G formula based on SCr of 1.47 mg/dL (H)).   Medical History: Past Medical History:  Diagnosis Date   Anxiety    Colon cancer ([place])    Dementia ([place]) 2022   Diabetes ([place])    Hypertension    Peripheral vascular disease ([place])     Medications:  No history of chronic anticoagulant use PTA  Assessment: 82 y.o. 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 Patient was seen in the ER on 10/19/22 for evaluation of left leg pain and had an ultrasound which showed a left tibial vein DVT.  She was discharged home and advised to follow-up with vascular surgery who continued her dual antiplatelet therapy and  did not place patient on anticoagulation due to her age and increased risk of falls. Limb is now with purplish/blue discoloration extending from left foot to the ankle. Vascular has been consulted, planning for left lower extremity angiography. Pharmacy has been consulted to initiate and titrate heparin infusion.   10/27/22 '@1725'$ : heparin level < 0.10. subtherapeutic. 10/28/22 '@0512'$ : HL = 0.44, therapeutic X 1  10/28/22 '@1249'$ : HL = 0.43, therapeutic. X 2   Goal of Therapy:  Heparin level 0.3-0.7 units/ml Monitor platelets by anticoagulation protocol: Yes   Plan:  Heparin level is therapeutic. Will continue heparin infusion at 1450 units/hr. Recheck heparin level and CBC with AM labs.   Oswald Hillock, PharmD 10/28/2022,1:11 PM

## 2022-10-28 NOTE — Progress Notes (Addendum)
Made Dr. Leslye Peer heart rate is ranging st 120-130. Bp 107/41. Wbc worse today at 18.4. Md turned page and placed orders for scheduled po metoprol '25mg'$  daily as well as prn iv metoprolol for heart rate greater than 130

## 2022-10-28 NOTE — Consult Note (Signed)
Pharmacy Antibiotic Note  Beth Mcguire is a 82 y.o. female admitted on 10/26/2022 with  foot wound .  Pharmacy has been consulted for vancomycin dosing.  Assessment: 82 yo F with PMH PAD s/sp LLE revascularization in 2023 and amputation, DM, HTN, h/o colon cancer, dementia, and recently diagnosed left tibial vein DVT. CXR shows heterogeneous opacities possibly c/f PNA. Patient currently on levofloxacin 750 mg IV q48H for that. Today pharmacy has been consulted to initiate vancomycin due to foot wound, possibly infected. Patient currently in AKI superimposed on CKD. Baseline Scr 1.1-1.2. Serum creatinine has slightly improved, from 1.53 >> 1.47 but still in AKI. Will dose for variable renal function with a one-time dose now and will re-assess renal function tomorrow.  Plan: Initiate vancomycin 1500 mg x 1 as a load Vanc random ordered with AM labs tomorrow Follow up culture results to assess for antibiotic optimization Monitor renal function to assess for any necessary antibiotic dosing changes  Height: '5\' 2"'$  (157.5 cm) Weight: 70.2 kg (154 lb 12.2 oz) IBW/kg (Calculated) : 50.1  Temp (24hrs), Avg:99 F (37.2 C), Min:97.5 F (36.4 C), Max:100.8 F (38.2 C)  Recent Labs  Lab 10/26/22 1042 10/26/22 1225 10/27/22 0651 10/28/22 0512  WBC 14.0*  --  15.9* 18.4*  CREATININE 1.53*  --  1.47*  --   LATICACIDVEN 1.6 1.5  --   --     Estimated Creatinine Clearance: 26.6 mL/min (A) (by C-G formula based on SCr of 1.47 mg/dL (H)).    Allergies  Allergen Reactions   Amoxicillin-Pot Clavulanate Rash   Zosyn [Piperacillin Sod-Tazobactam So] Rash    Antimicrobials this admission: Levofloxacin 1/20 >> Vancomycin 1/21 >>  Dose adjustments this admission: N/A  Microbiology results: 1/21 BCx: sent  Thank you for allowing pharmacy to be a part of this patient's care.  Will M. Ouida Sills, PharmD PGY-1 Pharmacy Resident 10/28/2022 2:23 PM

## 2022-10-28 NOTE — Progress Notes (Signed)
Progress Note   Patient: Beth Mcguire [id number]:[id number] DOB: 1940-11-23 DOA: 10/26/2022     2 DOS: the patient was seen and examined on 10/28/2022   Brief hospital course: 82 y.o. 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 Patient was seen in the ER on 10/19/22 for evaluation of left leg pain and had an ultrasound which showed a left tibial vein DVT.  She was discharged home and advised to follow-up with vascular surgery who continued her dual antiplatelet therapy and did not place patient on anticoagulation due to her age and increased risk of falls. Daughter notes that she complained of worsening back pain on the day of admission and she took off her sock and noted that her left foot had purplish blue discoloration extending from the left foot to the ankle and so she called EMS and patient was brought into the ER for further evaluation. She rates the pain in her leg a 10 x 10 in intensity at its worst. She denies having any chest pain, no shortness of breath, no nausea, no vomiting, no dizziness, no lightheadedness, no headache, no urinary symptoms, no blurred vision no focal deficit Abnormal labs include glucose of 262, white count 14.0, platelet count 403, serum creatinine 1.53 above a baseline of 1.15  Dr. Delana Meyer took to the vascular lab on 10/26/2022.  Angioplasty of the left superficial femoral and popliteal arteries, angioplasty and stent of left posterior tibial, mechanical thrombectomy of the entire SFA and popliteal using Rota Rex device.  1/20.  Left foot demarcated from the low sock line down.  Continue heparin drip.  Chest x-ray showing pneumonia started  Levaquin. 1/21.  Had fever last night.  Will get blood cultures today.  Add vancomycin.  Assessment and Plan: * Critical limb ischemia of left lower extremity ([place]) Status post procedure 1/19 by Dr. Delana Meyer to try to obtain the best circulation to her left foot.  Left foot demarcated from the low sock line down.  High risk for amputation.  Continue heparin drip.  Lobar pneumonia [facility]) May be progressing to sepsis with fever and tachycardia.  Started Levaquin on 1/21.  Will add vancomycin today (to cover possible infection of the foot).  Blood cultures today.  Acute hypoxic respiratory failure ([place]) Patient had a pulse ox of 89% on 4 L of oxygen.  Currently on 3 L of oxygen.  Acute kidney injury superimposed on CKD ([place]) AKI on CKD stage III A.  Baseline creatinine around 1.15.  She came in at 1.53.  Last creatinine 1.47.  Will give another fluid bolus today.   PAD (peripheral artery disease) ([facility]) Continue heparin drip.  Type 2 diabetes mellitus with chronic kidney disease, with long-term current use of insulin ([facility]) Restart 70/30 insulin 20 units twice a day.  Hold Glucophage.  Last hemoglobin A1c 7.4.  Hypertension Hold lisinopril.  Will use low-dose Toprol.  Tachycardia Will give a fluid bolus and as needed IV metoprolol and low-dose Toprol.        Subjective: Patient feeling okay.  Not complaining of any pain in her foot but unable to flex up and down with her left ankle.  Came in with left foot ischemia and needed urgent procedure  Physical Exam:  Vitals:   10/28/22 0823 10/28/22 0930 10/28/22 1203 10/28/22 1220  BP: (!) 154/52   (!) 107/41  Pulse: 90   96  Resp: 18   16  Temp: (!) 100.4 F (38 C)   97.8 F (36.6 C)  TempSrc: Oral   Oral  SpO2: 99% 99% 98% 100%  Weight:      Height:       Physical Exam HENT:     Head: Normocephalic.     Mouth/Throat:     Pharynx: No oropharyngeal exudate.  Eyes:     General: Lids are normal.     Conjunctiva/sclera:  Conjunctivae normal.  Cardiovascular:     Rate and Rhythm: Regular rhythm. Tachycardia present.     Heart sounds: Normal heart sounds, S1 normal and S2 normal.  Pulmonary:     Breath sounds: No decreased breath sounds, wheezing, rhonchi or rales.  Abdominal:     Palpations: Abdomen is soft.     Tenderness: There is no abdominal tenderness.  Musculoskeletal:     Right lower leg: No swelling.     Left lower leg: No swelling.  Skin:    General: Skin is warm.     Comments: Left foot demarcated purpleish reddish From a low sock line down.  Neurological:     Mental Status: She is alert.     Comments: Answers questions appropriately.     Data Reviewed: Hemoglobin 9.1, white blood cell count 18.4  Family Communication: Updated patient's daughter on the phone  Disposition: Status is: Inpatient Remains inpatient appropriate because: Had fever last night.  Started on antibiotics last night.  Add vancomycin today.  Still on heparin drip for ischemic foot.  High risk for amputation.  Planned Discharge Destination: To be determined    Time spent: 28 minutes  Author: Loletha Grayer, MD 10/28/2022 1:49 PM  For on call review [email or web address].

## 2022-10-28 NOTE — Assessment & Plan Note (Addendum)
Continue gentle fluids and as needed metoprolol IV for tachycardia.  This has improved.

## 2022-10-28 NOTE — Consult Note (Signed)
  ANTICOAGULATION CONSULT NOTE - Initial Consult  Pharmacy Consult for Heparin infusion Indication: DVT and limb ischemia  Allergies  Allergen Reactions   Amoxicillin-Pot Clavulanate Rash   Zosyn [Piperacillin Sod-Tazobactam So] Rash    Patient Measurements: Height: '5\' 2"'$  (157.5 cm) Weight: 70.2 kg (154 lb 12.2 oz) IBW/kg (Calculated) : 50.1 Heparin Dosing Weight: 66.3 kg  Vital Signs: Temp: 98 F (36.7 C) (01/21 0524) Temp Source: Oral (01/21 0524) BP: 148/44 (01/21 0412) Pulse Rate: 88 (01/21 0412)  Labs: Recent Labs    10/26/22 1042 10/26/22 1925 10/27/22 0651 10/27/22 1725 10/28/22 0512  HGB 12.3  --  10.3*  --  9.1*  HCT 38.6  --  31.5*  --  28.4*  PLT 403*  --  353  --  303  APTT  --  109*  --   --   --   LABPROT 14.9  --   --   --   --   INR 1.2  --   --   --   --   HEPARINUNFRC  --   --   --  <0.10* 0.44  CREATININE 1.53*  --  1.47*  --   --      Estimated Creatinine Clearance: 26.6 mL/min (A) (by C-G formula based on SCr of 1.47 mg/dL (H)).   Medical History: Past Medical History:  Diagnosis Date   Anxiety    Colon cancer ([place])    Dementia ([place]) 2022   Diabetes ([place])    Hypertension    Peripheral vascular disease ([place])     Medications:  No history of chronic anticoagulant use PTA  Assessment: 82 y.o. 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 Patient was seen in the ER on 10/19/22 for evaluation of left leg pain and had an ultrasound which showed a left tibial vein DVT.  She was discharged home and advised to follow-up with vascular surgery who continued her dual antiplatelet therapy and did not place patient on  anticoagulation due to her age and increased risk of falls. Limb is now with purplish/blue discoloration extending from left foot to the ankle. Vascular has been consulted, planning for left lower extremity angiography. Pharmacy has been consulted to initiate and titrate heparin infusion.   Goal of Therapy:  Heparin level 0.3-0.7 units/ml Monitor platelets by anticoagulation protocol: Yes   Plan:  Tirofiban to stop at 08:00. Per Dr. Delana Meyer start heparin after tirofiban is completed. Will start heparin at 1200 units/hr. Check heparin level in 8 hours. CBC daily while on heparin.   10/27/22 '@1725'$ : heparin level < 0.10. subtherapeutic. 10/28/22 '@0512'$ : HL = 0.44, therapeutic X 1   -Will continue pt on current rate and recheck HL on 1/21 @ 1300.  CBC daily   Christabella Alvira D, PharmD 10/28/2022,5:56 AM

## 2022-10-28 NOTE — Assessment & Plan Note (Addendum)
Changed Levaquin to Rocephin (abx started on 1/20), given profound side effects in elderly dementia patients.  She is tolerating Rocephin with no sign of allergic reaction.  Discontinued vancomycin previously since vascular team thinks the left foot is looking better and not a source of infection.

## 2022-10-29 ENCOUNTER — Encounter: Payer: Self-pay | Admitting: Vascular Surgery

## 2022-10-29 ENCOUNTER — Inpatient Hospital Stay: Payer: Medicare HMO

## 2022-10-29 DIAGNOSIS — J9601 Acute respiratory failure with hypoxia: Secondary | ICD-10-CM | POA: Diagnosis not present

## 2022-10-29 DIAGNOSIS — J181 Lobar pneumonia, unspecified organism: Secondary | ICD-10-CM | POA: Diagnosis not present

## 2022-10-29 DIAGNOSIS — R531 Weakness: Secondary | ICD-10-CM | POA: Diagnosis not present

## 2022-10-29 DIAGNOSIS — I70222 Atherosclerosis of native arteries of extremities with rest pain, left leg: Secondary | ICD-10-CM | POA: Diagnosis not present

## 2022-10-29 LAB — COMPREHENSIVE METABOLIC PANEL
ALT: 19 U/L (ref 0–44)
AST: 24 U/L (ref 15–41)
Albumin: 2.1 g/dL — ABNORMAL LOW (ref 3.5–5.0)
Alkaline Phosphatase: 90 U/L (ref 38–126)
Anion gap: 7 (ref 5–15)
BUN: 48 mg/dL — ABNORMAL HIGH (ref 8–23)
CO2: 19 mmol/L — ABNORMAL LOW (ref 22–32)
Calcium: 8.1 mg/dL — ABNORMAL LOW (ref 8.9–10.3)
Chloride: 108 mmol/L (ref 98–111)
Creatinine, Ser: 1.77 mg/dL — ABNORMAL HIGH (ref 0.44–1.00)
GFR, Estimated: 28 mL/min — ABNORMAL LOW (ref >60)
Glucose, Bld: 133 mg/dL — ABNORMAL HIGH (ref 70–99)
Potassium: 3.9 mmol/L (ref 3.5–5.1)
Sodium: 134 mmol/L — ABNORMAL LOW (ref 135–145)
Total Bilirubin: 0.6 mg/dL (ref 0.3–1.2)
Total Protein: 5.3 g/dL — ABNORMAL LOW (ref 6.5–8.1)

## 2022-10-29 LAB — CBC
HCT: 27.7 % — ABNORMAL LOW (ref 36.0–46.0)
Hemoglobin: 8.7 g/dL — ABNORMAL LOW (ref 12.0–15.0)
MCH: 28.1 pg (ref 26.0–34.0)
MCHC: 31.4 g/dL (ref 30.0–36.0)
MCV: 89.4 fL (ref 80.0–100.0)
Platelets: 293 10*3/uL (ref 150–400)
RBC: 3.1 MIL/uL — ABNORMAL LOW (ref 3.87–5.11)
RDW: 13.6 % (ref 11.5–15.5)
WBC: 16.9 10*3/uL — ABNORMAL HIGH (ref 4.0–10.5)
nRBC: 0 % (ref 0.0–0.2)

## 2022-10-29 LAB — GLUCOSE, CAPILLARY
Glucose-Capillary: 112 mg/dL — ABNORMAL HIGH (ref 70–99)
Glucose-Capillary: 130 mg/dL — ABNORMAL HIGH (ref 70–99)
Glucose-Capillary: 135 mg/dL — ABNORMAL HIGH (ref 70–99)
Glucose-Capillary: 201 mg/dL — ABNORMAL HIGH (ref 70–99)
Glucose-Capillary: 74 mg/dL (ref 70–99)
Glucose-Capillary: 89 mg/dL (ref 70–99)
Glucose-Capillary: 98 mg/dL (ref 70–99)

## 2022-10-29 LAB — VANCOMYCIN, RANDOM
Vancomycin Rm: 14 ug/mL
Vancomycin Rm: 26 ug/mL

## 2022-10-29 LAB — HEPARIN LEVEL (UNFRACTIONATED)
Heparin Unfractionated: 0.28 IU/mL — ABNORMAL LOW (ref 0.30–0.70)
Heparin Unfractionated: <0.1 IU/mL — ABNORMAL LOW (ref 0.30–0.70)

## 2022-10-29 MED ORDER — METOPROLOL SUCCINATE ER 25 MG PO TB24
12.5000 mg | ORAL_TABLET | Freq: Every day | ORAL | Status: DC
  Administered 2022-10-29: 12.5 mg via ORAL
  Filled 2022-10-29: qty 1

## 2022-10-29 MED ORDER — INSULIN ASPART PROT & ASPART (70-30 MIX) 100 UNIT/ML ~~LOC~~ SUSP
12.0000 [IU] | Freq: Two times a day (BID) | SUBCUTANEOUS | Status: DC
  Administered 2022-10-29: 12 [IU] via SUBCUTANEOUS

## 2022-10-29 MED ORDER — PROSOURCE PLUS PO LIQD
30.0000 mL | Freq: Three times a day (TID) | ORAL | Status: DC
  Administered 2022-10-29 – 2022-11-01 (×6): 30 mL via ORAL
  Filled 2022-10-29 (×15): qty 30

## 2022-10-29 MED ORDER — ZINC SULFATE 220 (50 ZN) MG PO CAPS
220.0000 mg | ORAL_CAPSULE | Freq: Every day | ORAL | Status: DC
  Administered 2022-10-29 – 2022-11-02 (×4): 220 mg via ORAL
  Filled 2022-10-29 (×5): qty 1

## 2022-10-29 MED ORDER — HEPARIN BOLUS VIA INFUSION
1000.0000 [IU] | Freq: Once | INTRAVENOUS | Status: AC
  Administered 2022-10-29: 1000 [IU] via INTRAVENOUS
  Filled 2022-10-29: qty 1000

## 2022-10-29 MED ORDER — VANCOMYCIN HCL 1750 MG/350ML IV SOLN
1750.0000 mg | Freq: Once | INTRAVENOUS | Status: AC
  Administered 2022-10-29: 1750 mg via INTRAVENOUS
  Filled 2022-10-29: qty 350

## 2022-10-29 MED ORDER — VITAMIN C 500 MG PO TABS
500.0000 mg | ORAL_TABLET | Freq: Two times a day (BID) | ORAL | Status: DC
  Administered 2022-10-29 – 2022-11-02 (×7): 500 mg via ORAL
  Filled 2022-10-29 (×9): qty 1

## 2022-10-29 MED ORDER — ADULT MULTIVITAMIN W/MINERALS CH
1.0000 | ORAL_TABLET | Freq: Every day | ORAL | Status: DC
  Administered 2022-10-29 – 2022-11-02 (×4): 1 via ORAL
  Filled 2022-10-29 (×5): qty 1

## 2022-10-29 NOTE — Progress Notes (Signed)
Progress Note   Patient: Beth Mcguire:[id number] DOB: 1941-09-29 DOA: 10/26/2022     3 DOS: the patient was seen and examined on 10/29/2022   Brief hospital course: 82 y.o. 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 Patient was seen in the ER on 10/19/22 for evaluation of left leg pain and had an ultrasound which showed a left tibial vein DVT.  She was discharged home and advised to follow-up with vascular surgery who continued her dual antiplatelet therapy and did not place patient on anticoagulation due to her age and increased risk of falls. Daughter notes that she complained of worsening back pain on the day of admission and she took off her sock and noted that her left foot had purplish blue discoloration extending from the left foot to the ankle and so she called EMS and patient was brought into the ER for further evaluation. She rates the pain in her leg a 10 x 10 in intensity at its worst. She denies having any chest pain, no shortness of breath, no nausea, no vomiting, no dizziness, no lightheadedness, no headache, no urinary symptoms, no blurred vision no focal deficit Abnormal labs include glucose of 262, white count 14.0, platelet count 403, serum creatinine 1.53 above a baseline of 1.15  Dr. Delana Meyer took to the vascular lab on 10/26/2022.  Angioplasty of the left superficial femoral and popliteal arteries, angioplasty and stent of left posterior tibial, mechanical thrombectomy of the entire SFA and popliteal using Rota Rex device.  1/20.  Left foot demarcated from the low sock line down.  Continue heparin drip.  Chest x-ray showing pneumonia started  Levaquin. 1/21.  Had fever last night.  Will get blood cultures today.  Add vancomycin. 1/22.  Patient complaining of right-sided weakness.  Stat CT scan of the head was negative.  MRI of the brain and MRA of the head and neck was ordered by neurology.  Assessment and Plan: * Acute right-sided weakness Stat CT scan of the head negative.  MRI and MRA brain and MRA neck ordered by neurology.  Currently on heparin drip.  Critical limb ischemia of left lower extremity (HCC) Status post procedure 1/19 by Dr. Delana Meyer to try to obtain the best circulation to her left foot.  Left foot demarcated from the low sock line down.  High risk for amputation.  Continue heparin drip.  Lobar pneumonia [facility]) May be progressing to sepsis with fever and tachycardia.  Started Levaquin on 1/20.  Added vancomycin on 1/21 (to cover possible infection of the foot).  Blood cultures negative for less than 24 hours.  IV fluids.  Acute hypoxic respiratory failure ([place]) Patient had a pulse ox of 89% on 4 L of oxygen.  Now on room air  Acute kidney injury superimposed on CKD ([place]) AKI on CKD stage III A.  Baseline creatinine around 1.15.  She came in at 1.53.  Last creatinine 1.77.  On IV fluid hydration.   PAD (peripheral artery disease) (HCC) Continue heparin drip.  Type 2 diabetes mellitus with chronic kidney disease, with long-term current use of insulin (HCC) Decrease 70/30 insulin 12 units twice a day with creatinine creeping up.  Hold Glucophage.  Last hemoglobin A1c 7.4.  Hypertension  Hold antihypertensive medications  Tachycardia Continue gentle fluids and as needed metoprolol IV for tachycardia        Subjective: Patient complains of right-sided weakness.  Unable to lift her right leg up off the bed but able to flex up and down with her ankle.  Able to squeeze my hand but unable to lift her arm up off the bed.  Case discussed with neurology and stroke workup underway  Physical Exam: Vitals:    10/28/22 1913 10/29/22 0027 10/29/22 0349 10/29/22 0827  BP: (!) 128/53 (!) 111/50 (!) 129/37 (!) 128/41  Pulse: 84 96 82 83  Resp: '18 20 18 '$ (!) 22  Temp: 98.4 F (36.9 C) 98.1 F (36.7 C) 99.7 F (37.6 C) 99.8 F (37.7 C)  TempSrc: Oral  Axillary Oral  SpO2: 98% 100% 97% 96%  Weight:      Height:       Physical Exam HENT:     Head: Normocephalic.     Mouth/Throat:     Pharynx: No oropharyngeal exudate.  Eyes:     General: Lids are normal.     Conjunctiva/sclera: Conjunctivae normal.  Cardiovascular:     Rate and Rhythm: Regular rhythm. Tachycardia present.     Heart sounds: Normal heart sounds, S1 normal and S2 normal.  Pulmonary:     Breath sounds: No decreased breath sounds, wheezing, rhonchi or rales.  Abdominal:     Palpations: Abdomen is soft.     Tenderness: There is no abdominal tenderness.  Musculoskeletal:     Right lower leg: Swelling present.     Left lower leg: Swelling present.  Skin:    General: Skin is warm.     Comments: Left foot demarcated purpleish reddish From a low sock line down.  Neurological:     Mental Status: She is alert.     Comments: Answers questions appropriately.  Unable to straight leg raise with right leg but able to flex up and down with the ankle.  Able to have grip strength but unable to lift her right arm this morning.  Right facial droop     Data Reviewed: Creatinine 1.77, sodium 134, hemoglobin 8.7  Family Communication: Updated patient's daughter on the phone  Disposition: Status is: Inpatient Remains inpatient appropriate because: New right-sided weakness today.  Stroke workup undergoing.  Planned Discharge Destination: To be determined    Time spent: 35 minutes Case discussed with neurologist.  Author: Loletha Grayer, MD 10/29/2022 4:07 PM  For on call review [email or web address].

## 2022-10-29 NOTE — Progress Notes (Signed)
Nutrition Follow-up  DOCUMENTATION CODES:   Not applicable  INTERVENTION:   -Liberalize diet to regular for widest variety of meal selections -MVI with minerals daily -500 mg vitamin C BID -220 mg zinc sulfate daily x 14 days -Magic cup TID with meals, each supplement provides 290 kcal and 9 grams of protein  -30 ml Prosource Plus TID, each supplement provides 100 kcals and 15 grams protein  NUTRITION DIAGNOSIS:   Increased nutrient needs related to wound healing as evidenced by  .  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  Pt admitted with critical limb ischemia of lt lower extremity.   1/19- s/p s/p L PTA PTA+S, Mech TE SFA/pop   Reviewed I/O's: +1.6 L x 24 hours and +4 L since admission  UOP: 400 ml x 24 hours   Per podiatry notes, no further revascularization options at this time. Pt is at high risk for amputation.   Case discussed with RN; pt complaining of rt upper extremity weakness, however, work-up negative for stroke.   Spoke with pt and daughter at beside. Pt lethargic, often falling asleep once woken up, so daughter (who is also her caregiver at home) provided history. Per daughter, pt with very good appetite PTA, often consuming 3 meals per day (Breakfast: eggs with cheese, toast, sugar free jelly; Lunch: canned soup or beef stew; Dinner: same as lunch). Over the past 2 days, pt with poor oral intake. Daughter shares that pt ate very little yesterday. Noted pt consumed a few bites of pancake off  of meal tray.   Pt daughter reports wt stable been stable; her UBW is around 167#. Reviewed wt hx; pt has experienced a 3.3% wt loss over the past 3 months, which is not significant for time frame.   Discussed importance of good meal and supplement intake to promote healing. Pt has tried Ensure and Boost supplements in the past, which she did not accept well. Reviewed options on formulary; pt amenable to [employer].   Medications reviewed and include vitamin C and 0.9% sodium chloride infusion @ 40 ml/hr.   Lab Results  Component Value Date   HGBA1C 7.4 (H) 05/29/2022   PTA DM medications are 500 mg metformin BID, 28 units insulin aspart- protamine aspart daily and 22 units insulin aspart-protamine aspart daily. Per daughter, pt CBGS are usually erratic, but noticed this more so over the past few weeks. Her PCP recently increased her insulin doses, however, daughter has not noticed a lot of difference since the change.   Labs reviewed: Na: 134, CBGS: 112-135 (inpatient orders for glycemic control are 20 units insulin aspart protamine-aspart BID).    NUTRITION - FOCUSED PHYSICAL EXAM:  Flowsheet Row Most Recent Value  Orbital Region No depletion  Upper Arm Region No depletion  Thoracic and Lumbar Region No depletion  Buccal Region No depletion  Temple Region No depletion  Clavicle Bone Region No depletion  Clavicle and Acromion Bone Region No depletion  Scapular Bone Region No depletion  Dorsal Hand No depletion  Patellar Region No depletion  Anterior Thigh Region No depletion  Posterior Calf Region No depletion  Edema (RD Assessment) Mild  Hair Reviewed  Eyes Reviewed  Mouth Reviewed  Skin Reviewed  Nails Reviewed       Diet Order:   Diet Order             Diet regular Fluid consistency: Thin  Diet effective now                   EDUCATION NEEDS:   Education needs have been addressed  Skin:  Skin Assessment: Reviewed RN Assessment  Last BM:  10/27/22 (type  7)  Height:   Ht Readings from Last 1 Encounters:  10/26/22 '5\' 2"'$  (1.575 m)    Weight:   Wt Readings from Last 1 Encounters:  10/26/22 70.2 kg    Ideal Body Weight:  50 kg  BMI:  Body mass index is 28.31 kg/m.  Estimated Nutritional Needs:   Kcal:  1550-1750  Protein:  85-100 grams  Fluid:  > 1.5 L    Loistine Chance, RD, LDN, [place] Registered Dietitian II Certified Diabetes Care and Education Specialist Please refer to [facility] for RD and/or RD on-call/weekend/after hours pager

## 2022-10-29 NOTE — Assessment & Plan Note (Addendum)
Stat CT scan of the head negative.  MRI negative for stroke.  Appreciate neurology consultation.

## 2022-10-29 NOTE — Consult Note (Signed)
Pharmacy Antibiotic Note  Beth Mcguire is a 82 y.o. female admitted on 10/26/2022 with  foot wound .  Pharmacy has been consulted for vancomycin dosing.  Assessment: 82 yo F with PMH PAD s/sp LLE revascularization in 2023 and amputation, DM, HTN, h/o colon cancer, dementia, and recently diagnosed left tibial vein DVT. CXR shows heterogeneous opacities possibly c/f PNA. Patient currently on levofloxacin 750 mg IV q48H for that. Today pharmacy has been consulted to initiate vancomycin due to foot wound, possibly infected. Patient currently in AKI superimposed on CKD. Baseline Scr 1.1-1.2. Serum creatinine has slightly improved, from 1.53 >> 1.47 but still in AKI. Will dose for variable renal function with a one-time dose now and will re-assess renal function tomorrow.  Plan: Initiate vancomycin 1500 mg x 1 as a load Vanc random ordered with AM labs tomorrow Follow up culture results to assess for antibiotic optimization Monitor renal function to assess for any necessary antibiotic dosing changes  Vancomycin 1500 mg IV X 1 given on 1/21 @ 1637.  1/22:  Vanc random @ 0256 = 14 - SUBtherapeutic (goal 15 - 20)  Will order Vancomycin 1750 mg IV X 1 on 1/22 @ 0359. Will recheck Vanc level in 12 hrs on 1/22 @ 1600.   Height: '5\' 2"'$  (157.5 cm) Weight: 70.2 kg (154 lb 12.2 oz) IBW/kg (Calculated) : 50.1  Temp (24hrs), Avg:98.8 F (37.1 C), Min:97.8 F (36.6 C), Max:100.4 F (38 C)  Recent Labs  Lab 10/26/22 1042 10/26/22 1225 10/27/22 0651 10/28/22 0512 10/29/22 0256  WBC 14.0*  --  15.9* 18.4* 16.9*  CREATININE 1.53*  --  1.47*  --  1.77*  LATICACIDVEN 1.6 1.5  --   --   --   VANCORANDOM  --   --   --   --  14     Estimated Creatinine Clearance: 22.1 mL/min (A) (by C-G formula based on SCr of 1.77 mg/dL (H)).    Allergies  Allergen Reactions   Amoxicillin-Pot Clavulanate Rash   Zosyn [Piperacillin Sod-Tazobactam So] Rash    Antimicrobials this admission: Levofloxacin 1/20  >> Vancomycin 1/21 >>  Dose adjustments this admission: N/A  Microbiology results: 1/21 BCx: sent  Thank you for allowing pharmacy to be a part of this patient's care.  Will M. Ouida Sills, PharmD PGY-1 Pharmacy Resident 10/29/2022 4:16 AM

## 2022-10-29 NOTE — Progress Notes (Addendum)
Mobility Specialist - Progress Note   10/29/22 1100  Mobility  Activity Off unit     2nd attempt today. Pt being taken off unit for MRI at time of arrival. Will attempt another date/time.    Kathee Delton Mobility Specialist 10/29/22, 11:31 AM

## 2022-10-29 NOTE — Consult Note (Signed)
Pharmacy Antibiotic Note  Beth Mcguire is a 82 y.o. female admitted on 10/26/2022 with  foot wound .  Pharmacy has been consulted for vancomycin dosing.  Assessment: 82 yo F with PMH PAD s/sp LLE revascularization in 2023 and amputation, DM, HTN, h/o colon cancer, dementia, and recently diagnosed left tibial vein DVT. CXR shows heterogeneous opacities possibly c/f PNA. Patient currently on levofloxacin 750 mg IV q48H for that. Today pharmacy has been consulted to initiate vancomycin due to foot wound, possibly infected. Patient currently in AKI superimposed on CKD. Baseline Scr 1.1-1.2. Serum creatinine has slightly improved, from 1.53 >> 1.47 but still in AKI. Will dose for variable renal function with a one-time dose now and will re-assess renal function tomorrow.  Plan: Random level 1/22 @ 1745 Supratherapeutic at 26 ug/ml Order random level for 1/23 @ 1400 Continue to dose based on levels  Height: '5\' 2"'$  (157.5 cm) Weight: 70.2 kg (154 lb 12.2 oz) IBW/kg (Calculated) : 50.1  Temp (24hrs), Avg:99.2 F (37.3 C), Min:98.1 F (36.7 C), Max:99.8 F (37.7 C)  Recent Labs  Lab 10/26/22 1042 10/26/22 1225 10/27/22 0651 10/28/22 0512 10/29/22 0256 10/29/22 1745  WBC 14.0*  --  15.9* 18.4* 16.9*  --   CREATININE 1.53*  --  1.47*  --  1.77*  --   LATICACIDVEN 1.6 1.5  --   --   --   --   VANCORANDOM  --   --   --   --  14 26     Estimated Creatinine Clearance: 22.1 mL/min (A) (by C-G formula based on SCr of 1.77 mg/dL (H)).    Allergies  Allergen Reactions   Amoxicillin-Pot Clavulanate Rash   Zosyn [Piperacillin Sod-Tazobactam So] Rash    Antimicrobials this admission: Levofloxacin 1/20 >> Vancomycin 1/21 >>  Dose adjustments this admission: N/A  Microbiology results: 1/21 BCx: ngtd  Thank you for allowing pharmacy to be a part of this patient's care.  Beatris Si, PharmD 10/29/2022 7:20 PM

## 2022-10-29 NOTE — Progress Notes (Signed)
Patient complaint of right sided weakness.  No neglect or visual field deficit, so as per neurology not code stroke.  CT head stat ordered.  Neurology consult.  DR Leslye Peer

## 2022-10-29 NOTE — Consult Note (Signed)
ANTICOAGULATION CONSULT NOTE   Pharmacy Consult for Heparin infusion Indication: DVT and limb ischemia  Allergies  Allergen Reactions   Amoxicillin-Pot Clavulanate Rash   Zosyn [Piperacillin Sod-Tazobactam So] Rash    Patient Measurements: Height: '5\' 2"'$  (157.5 cm) Weight: 70.2 kg (154 lb 12.2 oz) IBW/kg (Calculated) : 50.1 Heparin Dosing Weight: 66.3 kg  Vital Signs: Temp: 98.8 F (37.1 C) (01/22 2005) BP: 151/58 (01/22 2005) Pulse Rate: 82 (01/22 2005)  Labs: Recent Labs    10/27/22 0651 10/27/22 1725 10/28/22 0512 10/28/22 1249 10/29/22 0256  HGB 10.3*  --  9.1*  --  8.7*  HCT 31.5*  --  28.4*  --  27.7*  PLT 353  --  303  --  293  HEPARINUNFRC  --    < > 0.44 0.43 0.28*  CREATININE 1.47*  --   --   --  1.77*   < > = values in this interval not displayed.     Estimated Creatinine Clearance: 22.1 mL/min (A) (by C-G formula based on SCr of 1.77 mg/dL (H)).   Medical History: Past Medical History:  Diagnosis Date   Anxiety    Colon cancer ([place])    Dementia ([place]) 2022   Diabetes ([place])    Hypertension    Peripheral vascular disease ([place])     Medications:  No history of chronic anticoagulant use PTA  Assessment: 82 y.o. 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 Patient was seen in the ER on 10/19/22 for evaluation of left leg pain and had an ultrasound which showed a left tibial vein DVT.  She was discharged home and advised to follow-up with vascular surgery who continued her dual antiplatelet therapy and did not place patient on anticoagulation due to her age and increased risk of falls. Limb is now with purplish/blue discoloration  extending from left foot to the ankle. Vascular has been consulted, planning for left lower extremity angiography. Pharmacy has been consulted to initiate and titrate heparin infusion.   10/27/22 '@1725'$ : heparin level < 0.10. subtherapeutic. 10/28/22 '@0512'$ : HL = 0.44, therapeutic X 1  10/28/22 '@1249'$ : HL = 0.43, therapeutic. X 2 10/29/22 '@0256'$ : HL = 0.28, SUBtherapeutic 10/29/22'@1745'$ : HL < 0.10 SUBtherapeutic    Goal of Therapy:  Heparin level 0.3-0.7 units/ml Monitor platelets by anticoagulation protocol: Yes   Plan:  Heparin undetectable despite last dose increase. Suspect possible undocumented interruption in heparin infusion. Will order heparin 1000 units IV X 1 bolus and slightly increase drip rate to 1600 units/hr. Will recheck HL 8 hrs after rate change.   Dorothe Pea, PharmD, BCPS Clinical Pharmacist   10/29/2022,8:31 PM

## 2022-10-29 NOTE — Consult Note (Signed)
ANTICOAGULATION CONSULT NOTE   Pharmacy Consult for Heparin infusion Indication: DVT and limb ischemia  Allergies  Allergen Reactions   Amoxicillin-Pot Clavulanate Rash   Zosyn [Piperacillin Sod-Tazobactam So] Rash    Patient Measurements: Height: '5\' 2"'$  (157.5 cm) Weight: 70.2 kg (154 lb 12.2 oz) IBW/kg (Calculated) : 50.1 Heparin Dosing Weight: 66.3 kg  Vital Signs: Temp: 98.1 F (36.7 C) (01/22 0027) Temp Source: Oral (01/21 1913) BP: 111/50 (01/22 0027) Pulse Rate: 96 (01/22 0027)  Labs: Recent Labs    10/26/22 1042 10/26/22 1925 10/27/22 0651 10/27/22 1725 10/28/22 0512 10/28/22 1249 10/29/22 0256  HGB 12.3  --  10.3*  --  9.1*  --  8.7*  HCT 38.6  --  31.5*  --  28.4*  --  27.7*  PLT 403*  --  353  --  303  --  293  APTT  --  109*  --   --   --   --   --   LABPROT 14.9  --   --   --   --   --   --   INR 1.2  --   --   --   --   --   --   HEPARINUNFRC  --   --   --    < > 0.44 0.43 0.28*  CREATININE 1.53*  --  1.47*  --   --   --  1.77*   < > = values in this interval not displayed.     Estimated Creatinine Clearance: 22.1 mL/min (A) (by C-G formula based on SCr of 1.77 mg/dL (H)).   Medical History: Past Medical History:  Diagnosis Date   Anxiety    Colon cancer ([place])    Dementia ([place]) 2022   Diabetes ([place])    Hypertension    Peripheral vascular disease ([place])     Medications:  No history of chronic anticoagulant use PTA  Assessment: 82 y.o. 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 Patient was seen in the ER on 10/19/22 for evaluation of left leg pain and had an ultrasound which showed a left tibial vein DVT.  She was  discharged home and advised to follow-up with vascular surgery who continued her dual antiplatelet therapy and did not place patient on anticoagulation due to her age and increased risk of falls. Limb is now with purplish/blue discoloration extending from left foot to the ankle. Vascular has been consulted, planning for left lower extremity angiography. Pharmacy has been consulted to initiate and titrate heparin infusion.   10/27/22 '@1725'$ : heparin level < 0.10. subtherapeutic. 10/28/22 '@0512'$ : HL = 0.44, therapeutic X 1  10/28/22 '@1249'$ : HL = 0.43, therapeutic. X 2 10/29/22 '@0256'$ : HL = 0.28, SUBtherapeutic   Goal of Therapy:  Heparin level 0.3-0.7 units/ml Monitor platelets by anticoagulation protocol: Yes   Plan:  1/22: HL @ 0256 = 0.28, SUBtherapeutic  Will order heparin 1000 units IV X 1 bolus and increase drip rate to 1550 units/hr. Will recheck HL 8 hrs after rate change.   Makyia Erxleben D, PharmD 10/29/2022,3:47 AM

## 2022-10-29 NOTE — Consult Note (Signed)
Neurology Consultation Reason for Consult: Right sided weakness Referring Physician: Leslye Peer, R  CC: Confusion  History is obtained from: Patient, family member  HPI: Beth Mcguire is a 82 y.o. female with a history of diabetes, dementia, hypertension who was admitted with an ischemic left foot, currently on heparin drip.  At baseline, she is able to dress herself, and take care of some of her own activities of daily living, but does need significant help.   LKW: Unclear given confusion even reported 2 days ago tnk given?: no, anticoagulation Modified Rankin score: 3   Past Medical History:  Diagnosis Date   Anxiety    Colon cancer ([place])    Dementia ([place]) 2022   Diabetes ([place])    Hypertension    Peripheral vascular disease ([employer])      Family History  Problem Relation Age of Onset   Diabetes Other      Social History:  reports that she has never smoked. She has never been exposed to tobacco smoke. She has never used smokeless tobacco. She reports that she does not drink alcohol and does not use drugs.   Exam: Current vital signs: BP (!) 128/41 (BP Location: Right Arm)   Pulse 83   Temp 99.8 F (37.7 C) (Oral)   Resp (!) 22   Ht '5\' 2"'$  (1.575 m)   Wt 70.2 kg   SpO2 96%   BMI 28.31 kg/m  Vital signs in last 24 hours: Temp:  [97.8 F (36.6 C)-99.8 F (37.7 C)] 99.8 F (37.7 C) (01/22 0827) Pulse Rate:  [70-96] 83 (01/22 0827) Resp:  [16-22] 22 (01/22 0827) BP: (107-129)/(37-59) 128/41 (01/22 0827) SpO2:  [94 %-100 %] 96 % (01/22 0827)   Physical Exam  Appears well-developed and well-nourished.   Neuro: Mental Status: Patient is awake, alert, oriented to person, able to give month but not age, unable to give location.  Her speech is relatively fluent, unclear if she is having some degree of perseveration. Cranial Nerves: II: She appears to respond to visual stimuli in both hemifield, though counting is inconsistent. Pupils are equal, round, and reactive to  light.   III,IV, VI: EOMI without ptosis, she keeps her left eye closed, though unclear to me why, they appear conjugate when she opens both. V: Facial sensation is symmetric to temperature VII: She does appear to have a facial asymmetry with less movement on the right than left Motor: She gives very poor effort throughout, along both arms to drift relatively symmetrically, but I do question mild right weakness she is unable to move her left leg due to pain, she is able to lift her right leg against gravity. sensory: Sensation is symmetric to light touch and temperature in the arms and legs. Cerebellar: Does not perform    I have reviewed labs in epic and the results pertinent to this consultation are: Creatinine 1.77  I have reviewed the images obtained:CT -pending  Impression: 82 year old female with a history of dementia who is confused and complaining of right-sided weakness in the setting of multiple physiological stressors.  It is possible that she has had an acute ischemic stroke, but I do not think this is definite.  Given she has been confused for a few days, I do not think she would be a candidate for any type of thrombectomy.  She is on anticoagulation to try and salvage her left foot, and therefore this is a immediate compelling reason for anticoagulation.  Therefore if she has an ischemic stroke,  I think that the risk would be relatively low of continuing heparin and therefore would not favor stopping it just for the stroke.  Recommendations: 1) stat CT head to rule out hemorrhage 2) if CT is negative, would perform MRI/MRA head and neck 3) Stroke workup will be undertaken if MRI is positive.  4) neurology will follow  Roland Rack, MD [facility] [phone]  If 7pm- 7am, please page neurology on call as listed in [place].

## 2022-10-30 DIAGNOSIS — L89151 Pressure ulcer of sacral region, stage 1: Secondary | ICD-10-CM

## 2022-10-30 DIAGNOSIS — J181 Lobar pneumonia, unspecified organism: Secondary | ICD-10-CM | POA: Diagnosis not present

## 2022-10-30 DIAGNOSIS — D649 Anemia, unspecified: Secondary | ICD-10-CM

## 2022-10-30 DIAGNOSIS — J9601 Acute respiratory failure with hypoxia: Secondary | ICD-10-CM | POA: Diagnosis not present

## 2022-10-30 DIAGNOSIS — I70222 Atherosclerosis of native arteries of extremities with rest pain, left leg: Secondary | ICD-10-CM | POA: Diagnosis not present

## 2022-10-30 DIAGNOSIS — R531 Weakness: Secondary | ICD-10-CM | POA: Diagnosis not present

## 2022-10-30 LAB — LIPID PANEL
Cholesterol: 81 mg/dL (ref 0–200)
HDL: 20 mg/dL — ABNORMAL LOW (ref >40)
LDL Cholesterol: 37 mg/dL (ref 0–99)
Total CHOL/HDL Ratio: 4.1 RATIO
Triglycerides: 118 mg/dL (ref <150)
VLDL: 24 mg/dL (ref 0–40)

## 2022-10-30 LAB — HEPARIN LEVEL (UNFRACTIONATED)
Heparin Unfractionated: 0.27 IU/mL — ABNORMAL LOW (ref 0.30–0.70)
Heparin Unfractionated: 0.29 IU/mL — ABNORMAL LOW (ref 0.30–0.70)

## 2022-10-30 LAB — CBC
HCT: 27 % — ABNORMAL LOW (ref 36.0–46.0)
Hemoglobin: 8.6 g/dL — ABNORMAL LOW (ref 12.0–15.0)
MCH: 28.6 pg (ref 26.0–34.0)
MCHC: 31.9 g/dL (ref 30.0–36.0)
MCV: 89.7 fL (ref 80.0–100.0)
Platelets: 355 10*3/uL (ref 150–400)
RBC: 3.01 MIL/uL — ABNORMAL LOW (ref 3.87–5.11)
RDW: 13.8 % (ref 11.5–15.5)
WBC: 16 10*3/uL — ABNORMAL HIGH (ref 4.0–10.5)
nRBC: 0 % (ref 0.0–0.2)

## 2022-10-30 LAB — BASIC METABOLIC PANEL
Anion gap: 8 (ref 5–15)
BUN: 44 mg/dL — ABNORMAL HIGH (ref 8–23)
CO2: 18 mmol/L — ABNORMAL LOW (ref 22–32)
Calcium: 8.4 mg/dL — ABNORMAL LOW (ref 8.9–10.3)
Chloride: 109 mmol/L (ref 98–111)
Creatinine, Ser: 1.45 mg/dL — ABNORMAL HIGH (ref 0.44–1.00)
GFR, Estimated: 36 mL/min — ABNORMAL LOW (ref >60)
Glucose, Bld: 230 mg/dL — ABNORMAL HIGH (ref 70–99)
Potassium: 3.9 mmol/L (ref 3.5–5.1)
Sodium: 135 mmol/L (ref 135–145)

## 2022-10-30 LAB — GLUCOSE, CAPILLARY
Glucose-Capillary: 90 mg/dL (ref 70–99)
Glucose-Capillary: 85 mg/dL (ref 70–99)
Glucose-Capillary: 214 mg/dL — ABNORMAL HIGH (ref 70–99)
Glucose-Capillary: 319 mg/dL — ABNORMAL HIGH (ref 70–99)
Glucose-Capillary: 376 mg/dL — ABNORMAL HIGH (ref 70–99)

## 2022-10-30 LAB — VANCOMYCIN, RANDOM: Vancomycin Rm: 16 ug/mL

## 2022-10-30 MED ORDER — HEPARIN BOLUS VIA INFUSION
1000.0000 [IU] | Freq: Once | INTRAVENOUS | Status: AC
  Administered 2022-10-30: 1000 [IU] via INTRAVENOUS
  Filled 2022-10-30: qty 1000

## 2022-10-30 MED ORDER — APIXABAN 2.5 MG PO TABS
2.5000 mg | ORAL_TABLET | Freq: Two times a day (BID) | ORAL | Status: DC
  Administered 2022-10-31 – 2022-11-02 (×4): 2.5 mg via ORAL
  Filled 2022-10-30 (×5): qty 1

## 2022-10-30 MED ORDER — VANCOMYCIN HCL IN DEXTROSE 1-5 GM/200ML-% IV SOLN
1000.0000 mg | Freq: Once | INTRAVENOUS | Status: AC
  Administered 2022-10-30: 1000 mg via INTRAVENOUS
  Filled 2022-10-30: qty 200

## 2022-10-30 MED ORDER — ASPIRIN 81 MG PO TBEC
81.0000 mg | DELAYED_RELEASE_TABLET | Freq: Every day | ORAL | Status: DC
  Administered 2022-10-30 – 2022-11-01 (×3): 81 mg via ORAL
  Filled 2022-10-30 (×3): qty 1

## 2022-10-30 MED ORDER — CLOPIDOGREL BISULFATE 75 MG PO TABS
75.0000 mg | ORAL_TABLET | Freq: Every day | ORAL | Status: DC
  Administered 2022-10-30 – 2022-11-02 (×3): 75 mg via ORAL
  Filled 2022-10-30 (×4): qty 1

## 2022-10-30 MED ORDER — INSULIN ASPART PROT & ASPART (70-30 MIX) 100 UNIT/ML ~~LOC~~ SUSP
10.0000 [IU] | Freq: Two times a day (BID) | SUBCUTANEOUS | Status: DC
  Administered 2022-10-30 – 2022-11-02 (×7): 10 [IU] via SUBCUTANEOUS

## 2022-10-30 NOTE — Evaluation (Signed)
Physical Therapy Evaluation Patient Details Name: Beth Mcguire MRN: [id number] DOB: 1941/04/29 Today's Date: 10/30/2022  History of Present Illness  82 y.o. 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 Patient was seen in the ER on 10/19/22 for evaluation of left leg pain and had an ultrasound which showed a left tibial vein DVT.  She was discharged home and advised to follow-up with vascular surgery who continued her dual antiplatelet therapy and did not place patient on anticoagulation due to her age and increased risk of falls. Limb is now with purplish/blue discoloration extending from left foot to the ankle.  Clinical Impression  Pt received in bed lethargic but arousable agreeable to participate in PT evaluation. Pt in severe pain with touch. L foot with bluish purple discoloration, open  sore with epidermis pilled off in the medial lower leg, and open pocketed blister and blisters. Pt Participated in bed mobility and attempted to stand with NWB to LLE requiring 2 people assist and max to maintain NWB to LLE.  Pt unable to stand with  2 person assist 2/2 to pain and lethargy. Pt required Max assist of 2 to position the pt in bed. B heels positioned and suspended to prevent pressure. PT will continue in acute. Pt will benefit from SNF after acute care.      Recommendations for follow up therapy are one component of a multi-disciplinary discharge planning process, led by the attending physician.  Recommendations may be updated based on patient status, additional functional criteria and insurance authorization.  Follow Up Recommendations Skilled nursing-short term rehab (<3  hours/day) Can patient physically be transported by private vehicle: No    Assistance Recommended at Discharge Other (comment)  Patient can return home with the following  Two people to help with walking and/or transfers;Two people to help with bathing/dressing/bathroom;Assistance with cooking/housework;Assistance with feeding;Direct supervision/assist for medications management;Assist for transportation;Help with stairs or ramp for entrance    Equipment Recommendations None recommended by PT  Recommendations for Other Services       Functional Status Assessment Patient has had a recent decline in their functional status and demonstrates the ability to make significant improvements in function in a reasonable and predictable amount of time.     Precautions / Restrictions Precautions Precautions: Fall Restrictions Weight Bearing Restrictions: Yes LLE Weight Bearing: Non weight bearing      Mobility  Bed Mobility               General bed mobility comments: assistance for B LEs and trunk and in pain with mild palpation.    Transfers Overall transfer level: Needs assistance                 General transfer comment: one person keeping L LE NWB but pt is unable to clear buttocks from bed with +2 assistance. Unable to stand fully due to lethargy and pain.    Ambulation/Gait               General Gait Details: unsafe  Stairs: N/A            Wheelchair Mobility    Modified Rankin (Stroke Patients Only)       Balance Overall balance assessment: Needs assistance  Sitting-balance support: Bilateral upper extremity supported Sitting balance-Leahy Scale: Fair       Standing balance-Leahy Scale: Zero Standing balance comment: unable to achieve standing position.                             Pertinent Vitals/Pain Pain Assessment Pain Assessment: No/denies pain Faces Pain Scale: Hurts whole lot Breathing: occasional labored breathing,  short period of hyperventilation Pain Location: L LE Pain Descriptors / Indicators: Discomfort, Grimacing, Guarding Pain Intervention(s): Limited activity within patient's tolerance, Monitored during session    Home Living Family/patient expects to be discharged to:: Skilled nursing facility Living Arrangements: Children                      Prior Function Prior Level of Function : Needs assist             Mobility Comments: Pt is ambulatory with RW per her report and use of w/c with family assisting outside of the home. ADLs Comments: Pt reports she needs some help for shoes and does not bath herself at baseline. Daughter assists with meds and IADLs     Hand Dominance   Dominant Hand: Right    Extremity/Trunk Assessment   Upper Extremity Assessment Upper Extremity Assessment: Defer to OT evaluation    Lower Extremity Assessment Lower Extremity Assessment: Generalized weakness       Communication   Communication: No difficulties (lethargic)  Cognition Arousal/Alertness: Lethargic Behavior During Therapy: WFL for tasks assessed/performed Overall Cognitive Status: No family/caregiver present to determine baseline cognitive functioning                                 General Comments: short term memory deficits noted        General Comments General comments (skin integrity, edema, etc.): Discoloration bluish purple to L heel, blisters, pocket ulcer  but covered with soft tissue ulcer  and open skin in Medial aspect of the lower leg with weeping edema.    Exercises     Assessment/Plan    PT Assessment Patient needs continued PT services  PT Problem List Decreased strength;Decreased range of motion;Decreased activity tolerance;Decreased balance;Decreased coordination;Decreased cognition;Decreased mobility;Decreased knowledge of use of DME;Decreased safety awareness;Pain;Obesity;Decreased skin integrity;Decreased knowledge of precautions        PT Treatment Interventions Gait training;Functional mobility training;Therapeutic activities;Therapeutic exercise;Balance training;Neuromuscular re-education;Cognitive remediation;Patient/family education    PT Goals (Current goals can be found in the Care Plan section)  Acute Rehab PT Goals Patient Stated Goal: unable PT Goal Formulation: Patient unable to participate in goal setting Potential to Achieve Goals: Fair    Frequency Min 2X/week     Co-evaluation   Reason for Co-Treatment: For patient/therapist safety;To address functional/ADL transfers;Complexity of the patient's impairments (multi-system involvement) PT goals addressed during session: Mobility/safety with mobility OT goals addressed during session: ADL's and self-care       AM-PAC PT "6 Clicks" Mobility  Outcome Measure Help needed turning from your back to your side while in a flat bed without using bedrails?: A Lot Help needed moving from lying on your back to sitting on the side of a flat bed without using bedrails?: A Lot Help needed moving to and from a bed to a chair (including a wheelchair)?: A Lot Help needed standing up from a chair using your arms (e.g., wheelchair or bedside chair)?: Total Help needed to  walk in hospital room?: Total Help needed climbing 3-5 steps with a railing? : Total 6 Click Score: 9    End of Session Equipment Utilized During Treatment: Gait belt Activity Tolerance: Patient limited by pain;Patient limited by lethargy;Treatment limited secondary to medical complications (Comment) Patient left: in bed;with call bell/phone within reach;with bed alarm set;with restraints reapplied Nurse Communication: Mobility status;Precautions PT Visit Diagnosis: Muscle weakness (generalized) (M62.81);Pain;Difficulty in walking, not elsewhere classified (R26.2);Unsteadiness on feet (R26.81) Pain - Right/Left: Left Pain - part of body: Leg    Time: 6861-6837 PT Time Calculation (min) (ACUTE  ONLY): 17 min   Charges:   PT Evaluation $PT Eval Moderate Complexity: 1 Mod         Oden Lindaman PT DPT 2:27 PM,10/30/22

## 2022-10-30 NOTE — TOC Progression Note (Signed)
Transition of Care [facility] - Progression Note    Patient Details  Name: Beth Mcguire MRN: [id number] Date of Birth: [birth year]  [facility]) CM/SW Contact  Laurena Slimmer, RN Phone Number: 10/30/2022, 1:56 PM  Clinical Narrative:    Case reviewed for needs and disposition.         Expected Discharge Plan and Services                                               Social Determinants of Health (SDOH) Interventions SDOH Screenings   Food Insecurity: No Food Insecurity (10/26/2022)  Housing: Low Risk  (10/26/2022)  Transportation Needs: No Transportation Needs (10/26/2022)  Utilities: Not At Risk (10/26/2022)  Tobacco Use: Low Risk  (10/29/2022)    Readmission Risk Interventions     No data to display

## 2022-10-30 NOTE — Evaluation (Signed)
Occupational Therapy Evaluation Patient Details Name: Beth Mcguire MRN: [id number] DOB: 04-May-1941 Today's Date: 10/30/2022   History of Present Illness 82 y.o. 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 Patient was seen in the ER on 10/19/22 for evaluation of left leg pain and had an ultrasound which showed a left tibial vein DVT.  She was discharged home and advised to follow-up with vascular surgery who continued her dual antiplatelet therapy and did not place patient on anticoagulation due to her age and increased risk of falls. Limb is now with purplish/blue discoloration extending from left foot to the ankle.   Clinical Impression   Patient presenting with decreased Ind in self care,balance, functional mobility/transfers, endurance, and safety awareness. Patient reports living at home with daughter and having assistance for self care and mobility. She states she was walking with RW up until hospital admission. Pt having difficulty tolerating L LE movement for functional tasks and unable to maintain NWB without assistance to do so. +2 assistance for bed mobility and attempt to stand with pt being unable to clear buttocks from bed. Patient will benefit from acute OT to increase overall independence in the areas of ADLs, functional mobility, and safety awareness in order to safely discharge to next venue of care.      Recommendations for follow up therapy are one component of a multi-disciplinary discharge planning process, led by the attending physician.  Recommendations may be updated based on patient status, additional functional criteria and insurance authorization.    Follow Up Recommendations  Skilled nursing-short term rehab (<3 hours/day)     Assistance Recommended at Discharge Frequent or constant Supervision/Assistance  Patient can return home with the following Two people to help with walking and/or transfers;A lot of help with bathing/dressing/bathroom;Help with stairs or ramp for entrance;Assist for transportation;Direct supervision/assist for financial management;Direct supervision/assist for medications management    Functional Status Assessment  Patient has had a recent decline in their functional status and demonstrates the ability to make significant improvements in function in a reasonable and predictable amount of time.  Equipment Recommendations  Other (comment) (defer to next venue of care)    Recommendations for Other Services       Precautions / Restrictions Precautions Precautions: Fall Restrictions Weight Bearing Restrictions: Yes LLE Weight Bearing: Non weight bearing      Mobility Bed Mobility Overal bed mobility: Needs Assistance Bed Mobility: Supine to Sit, Sit to Supine     Supine to sit: +2 for physical assistance, Mod assist Sit to supine: +2 for physical assistance, Max assist   General bed mobility comments: assistance for B LEs and trunk    Transfers Overall transfer level: Needs assistance Equipment used: 2 person hand held assist               General transfer comment: one person keeping L LE NWB but pt is unable to clear buttocks from bed with +2 assistance      Balance Overall balance assessment: Needs assistance Sitting-balance support: Bilateral upper extremity supported Sitting balance-Leahy Scale: Fair  ADL either performed or assessed with clinical judgement   ADL Overall ADL's : Needs assistance/impaired                                       General ADL Comments: unable to transfer safely this session. Pt  would need extensive assistance for ADLs     Vision Patient Visual Report: No change from baseline              Pertinent Vitals/Pain Pain Assessment Pain Assessment: Faces Faces Pain Scale: Hurts whole lot Pain Location: L LE Pain Descriptors / Indicators: Discomfort, Grimacing, Guarding Pain Intervention(s): Limited activity within patient's tolerance, Premedicated before session, Repositioned     Hand Dominance Right   Extremity/Trunk Assessment Upper Extremity Assessment Upper Extremity Assessment: Defer to OT evaluation   Lower Extremity Assessment Lower Extremity Assessment: Generalized weakness       Communication Communication Communication: No difficulties (lethargic)   Cognition Arousal/Alertness: Lethargic Behavior During Therapy: WFL for tasks assessed/performed Overall Cognitive Status: No family/caregiver present to determine baseline cognitive functioning                                                  Home Living Family/patient expects to be discharged to:: Skilled nursing facility Living Arrangements: Children                                      Prior Functioning/Environment Prior Level of Function : Needs assist             Mobility Comments: Pt is ambulatory with RW per her report and use of w/c with family assisting outside of the home. ADLs Comments: Pt reports she needs some help for shoes and does not bath herself at baseline. Daughter assists with meds and IADLs        OT Problem List: Decreased strength;Decreased activity tolerance;Impaired balance (sitting and/or standing);Decreased safety awareness;Decreased knowledge of use of DME or AE;Decreased cognition      OT Treatment/Interventions: Self-care/ADL training;Therapeutic exercise;Therapeutic activities;DME and/or AE instruction;Patient/family education;Balance training;Energy conservation    OT Goals(Current goals can be found in the care  plan section) Acute Rehab OT Goals Patient Stated Goal: none stated OT Goal Formulation: Patient unable to participate in goal setting Time For Goal Achievement: 11/13/22 Potential to Achieve Goals: Fair  OT Frequency: Min 2X/week    Co-evaluation PT/OT/SLP Co-Evaluation/Treatment: Yes Reason for Co-Treatment: For patient/therapist safety;To address functional/ADL transfers;Complexity of the patient's impairments (multi-system involvement) PT goals addressed during session: Mobility/safety with mobility OT goals addressed during session: ADL's and self-care      AM-PAC OT "6 Clicks" Daily Activity     Outcome Measure Help from another person eating meals?: A Little Help from another person taking care of personal grooming?: A Little Help from another person toileting, which includes using toliet, bedpan, or urinal?: Total Help from another person bathing (including washing, rinsing, drying)?: A Lot Help from another person to put on and taking off regular upper body clothing?: A Little Help from another person to put on and taking off regular lower body clothing?: Total 6 Click Score: 13   End of Session Nurse Communication: Mobility status  Activity  Tolerance: Patient limited by pain Patient left: in bed;with call bell/phone within reach;with bed alarm set  OT Visit Diagnosis: Unsteadiness on feet (R26.81);Repeated falls (R29.6);Muscle weakness (generalized) (M62.81)                Time: 6269-4854 OT Time Calculation (min): 23 min Charges:  OT General Charges $OT Visit: 1 Visit OT Evaluation $OT Eval Moderate Complexity: 1 [address] [place], OTR/L , CBIS ascom [phone]  10/30/22, 2:06 PM

## 2022-10-30 NOTE — Plan of Care (Signed)

## 2022-10-30 NOTE — Consult Note (Signed)
Pharmacy Antibiotic Note  Beth Mcguire is a 82 y.o. female admitted on 10/26/2022 with  foot wound .  Pharmacy has been consulted for vancomycin dosing.  Assessment: 82 yo F with PMH PAD s/sp LLE revascularization in 2023 and amputation, DM, HTN, h/o colon cancer, dementia, and recently diagnosed left tibial vein DVT. CXR shows heterogeneous opacities possibly c/f PNA. Patient currently on levofloxacin 750 mg IV q48H for that. Today pharmacy has been consulted to initiate vancomycin due to foot wound, possibly infected. Patient currently in AKI superimposed on CKD. Baseline Scr 1.1-1.2. Serum creatinine has slightly improved, from 1.53 >> 1.47 but still in AKI. Will dose for variable renal function with a one-time dose now and will re-assess renal function tomorrow.  Plan: Vancomycin random level 1/23 '@11'$ :40 (~ 32 hours post 1750 mg dose) was 16. Will give a 15 mg/kg dose x 1 and follow up with a 24 hour level. Scr is trending down. Once renal function is at baseline, then we can use AUC dosing.   Height: '5\' 2"'$  (157.5 cm) Weight: 70.2 kg (154 lb 12.2 oz) IBW/kg (Calculated) : 50.1  Temp (24hrs), Avg:98.7 F (37.1 C), Min:98.4 F (36.9 C), Max:99.1 F (37.3 C)  Recent Labs  Lab 10/26/22 1042 10/26/22 1225 10/27/22 0651 10/28/22 0512 10/29/22 0256 10/29/22 0256 10/29/22 1745 10/30/22 1140  WBC 14.0*  --  15.9* 18.4* 16.9*  --   --  16.0*  CREATININE 1.53*  --  1.47*  --  1.77*  --   --  1.45*  LATICACIDVEN 1.6 1.5  --   --   --   --   --   --   VANCORANDOM  --   --   --   --  14   < > 26 16   < > = values in this interval not displayed.     Estimated Creatinine Clearance: 27 mL/min (A) (by C-G formula based on SCr of 1.45 mg/dL (H)).    Allergies  Allergen Reactions   Amoxicillin-Pot Clavulanate Rash   Zosyn [Piperacillin Sod-Tazobactam So] Rash    Antimicrobials this admission: Levofloxacin 1/20 >> Vancomycin 1/21 >>  Dose adjustments this  admission: N/A  Microbiology results: 1/21 BCx: ngtd  Thank you for allowing pharmacy to be a part of this patient's care.  Eleonore Chiquito, PharmD 10/30/2022 12:30 PM

## 2022-10-30 NOTE — Progress Notes (Signed)
Subjective: No significant changes, no stroke on MRI  Exam: Vitals:   10/30/22 0435 10/30/22 0946  BP: (!) 136/52 (!) 161/60  Pulse: 77 65  Resp: 18 20  Temp:  98.4 F (36.9 C)  SpO2: 97% 99%   Gen: In bed, NAD Resp: non-labored breathing, no acute distress Abd: soft, nt  Neuro: MS: Awake, alert, able to give month, but not year CN: She is more cooperative today and does have a mild abduction deficit of the right eye which she states is her baseline, also has mild right facial weakness Motor: She gives poor effort bilaterally, but relatively symmetrically Sensory:.  Intact to light touch   Pertinent results: She has multifocal intracranial atherosclerotic disease, but no acute findings    Impression: 82 year old female with confusion who was noted to have some right facial weakness yesterday.  She continues to have this despite negative MRI, and I suspect she has mild underlying predisposition to this with recrudescence in the setting of her acute physiological stressors.  Certainly with her intracranial atherosclerotic disease, this is very possible.  Management at this time consists solely of addressing her other medical issues, no further specific workup from a neurologic perspective at this time.  Recommendations: 1) management of infection/ischemic limb per primary/vascular 2) neurology will be available on an as-needed basis, please call with further questions or concerns.  Roland Rack, MD [facility] [phone]  If 7pm- 7am, please page neurology on call as listed in [place].

## 2022-10-30 NOTE — Consult Note (Signed)
ANTICOAGULATION CONSULT NOTE - Initial Consult  Pharmacy Consult for Apixaban Indication:  arterial thrombus  Allergies  Allergen Reactions   Amoxicillin-Pot Clavulanate Rash   Zosyn [Piperacillin Sod-Tazobactam So] Rash    Patient Measurements: Height: '5\' 2"'$  (157.5 cm) Weight: 70.2 kg (154 lb 12.2 oz) IBW/kg (Calculated) : 50.1   Vital Signs: Temp: 98.5 F (36.9 C) (01/23 1259) Temp Source: Oral (01/23 0946) BP: 168/62 (01/23 1259) Pulse Rate: 82 (01/23 1259)  Labs: Recent Labs    10/28/22 0512 10/28/22 1249 10/29/22 0256 10/29/22 1745 10/30/22 0924 10/30/22 1140  HGB 9.1*  --  8.7*  --   --  8.6*  HCT 28.4*  --  27.7*  --   --  27.0*  PLT 303  --  293  --   --  355  HEPARINUNFRC 0.44   < > 0.28* <0.10* 0.27*  --   CREATININE  --   --  1.77*  --   --  1.45*   < > = values in this interval not displayed.    Estimated Creatinine Clearance: 27 mL/min (A) (by C-G formula based on SCr of 1.45 mg/dL (H)).   Medical History: Past Medical History:  Diagnosis Date   Anxiety    Colon cancer ([place])    Dementia ([place]) 2022   Diabetes ([place])    Hypertension    Peripheral vascular disease ([place])     Medications:  No chronic anticoagulants PTA  Assessment: 82 y.o. 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 Patient was seen in the ER on 10/19/22 for evaluation of left leg pain and had an ultrasound which showed a left tibial vein DVT.  She was discharged home and advised to follow-up with vascular surgery who continued her dual antiplatelet therapy and did not place patient on anticoagulation due to her age and increased risk of falls. Limb is now with  purplish/blue discoloration extending from left foot to the ankle. Vascular has been consulted, planning for left lower extremity angiography. Pharmacy has been consulted to initiate apixaban on 10/31/22  Goal of Therapy:  Monitor platelets by anticoagulation protocol: Yes   Plan:  --Scheduled heparin drip to stop 10/31/22 @ 0900 --Spoke with Annalee Genta, NP from vascular and they want Apixiban started at 2.5 mg po BID.  Scheduled to start 10/31/22 @ 1000. --CBC at least every 72 hours  Lorin Picket, PharmD 10/30/2022,5:44 PM

## 2022-10-30 NOTE — Assessment & Plan Note (Signed)
Hemoglobin down to 8.6.  Ferritin normal 135.  Vascular surgery recommending triple blood thinners which would make her higher risk for bleeding. Hbg has been stable. No active bleeding

## 2022-10-30 NOTE — Progress Notes (Signed)
Progress Note    10/30/2022 12:57 PM 4 Days Post-Op  Subjective:  82 y.o. 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   Patient underwent on 10/26/22 Angioplasty of the left superficial femoral and popliteal arteries, angioplasty and stent of left posterior tibial, mechanical thrombectomy of the entire SFA and popliteal using Rota Rex device.   Today on evaluation patient states she feels much better overall. States her left leg feels better and her foot feels much warmer. No complaints overnight and all her vitals remain the same.    Vitals:   10/30/22 0435 10/30/22 0946  BP: (!) 136/52 (!) 161/60  Pulse: 77 65  Resp: 18 20  Temp:  98.4 F (36.9 C)  SpO2: 97% 99%   Physical Exam: Cardiac:  RRR Lungs:  Clear but diminished in the bases  Incisions:  Left Lower C/D/I No complications to note Extremities:  Left Lower remains slightly ecchymotic with blistering of the skin in multiple small places. Foot is pink and warm today. Post Tibial pulse is palpable but weak.  Abdomen:  Positive bowel sounds, soft, NT/ND  Neurologic: AAOX3  CBC    Component Value Date/Time   WBC 16.0 (H) 10/30/2022 1140   RBC 3.01 (L) 10/30/2022 1140   HGB 8.6 (L) 10/30/2022 1140   HGB 13.2 12/04/2013 0953   HCT 27.0 (L) 10/30/2022 1140   HCT 41.0 12/04/2013 0953   PLT 355 10/30/2022 1140   PLT 324 12/04/2013 0953   MCV 89.7 10/30/2022 1140   MCV 85 12/04/2013 0953   MCH 28.6 10/30/2022 1140   MCHC 31.9 10/30/2022 1140   RDW 13.8 10/30/2022 1140   RDW 14.1 12/04/2013 0953   LYMPHSABS 1.2 10/26/2022 1042   LYMPHSABS 2.3 12/04/2013 0953   MONOABS 0.7 10/26/2022 1042   MONOABS  0.4 12/04/2013 0953   EOSABS 0.1 10/26/2022 1042   EOSABS 0.2 12/04/2013 0953   BASOSABS 0.1 10/26/2022 1042   BASOSABS 0.1 12/04/2013 0953    BMET    Component Value Date/Time   NA 135 10/30/2022 1140   NA 142 11/13/2011 1110   K 3.9 10/30/2022 1140   K 4.6 11/13/2011 1110   CL 109 10/30/2022 1140   CL 106 11/13/2011 1110   CO2 18 (L) 10/30/2022 1140   CO2 28 11/13/2011 1110   GLUCOSE 230 (H) 10/30/2022 1140   GLUCOSE 123 (H) 11/13/2011 1110   BUN 44 (H) 10/30/2022 1140   BUN 12 11/13/2011 1110   CREATININE 1.45 (H) 10/30/2022 1140   CREATININE 1.10 12/04/2013 0953   CALCIUM 8.4 (L) 10/30/2022 1140   CALCIUM 8.7 11/13/2011 1110   GFRNONAA 36 (L) 10/30/2022 1140   GFRNONAA 49 (L) 12/04/2013 0953   GFRAA 54 (L) 08/14/2017 0912   GFRAA 57 (L) 12/04/2013 0953    INR    Component Value Date/Time   INR 1.2 10/26/2022 1042     Intake/Output Summary (Last 24 hours) at 10/30/2022 1257 Last data filed at 10/30/2022 1038 Gross per 24 hour  Intake 1726.57 ml  Output 225 ml  Net 1501.57 ml     Assessment/Plan:  82 y.o. female is s/p Angioplasty of the left superficial femoral and popliteal arteries, angioplasty and stent of left posterior  tibial, mechanical thrombectomy of the entire SFA and popliteal using Rota Rex device.  4 Days Post-Op   Assessment: Left foot appears to be healing slowly. There is much less ecchymotic/purple area of the foot. Foot is warm to touch and pink. There are multiple areas of skin blistering, heel skin breakdown as well.   Plan: Access site is without complications.  Left Lower extremity healing slowly. No other revascularization options per Dr Delana Meyer.   Antibiotics per Primary Team Patient will need to go home on ASA '81mg'$  QD, Plavix '75mg'$  QD and Eliquis 2.5 mg BID  DVT prophylaxis:  Lovenox    Cyndie Chime [facility] 10/30/2022 12:57 PM

## 2022-10-30 NOTE — Assessment & Plan Note (Signed)
Present on admission, see full description below.

## 2022-10-30 NOTE — Consult Note (Signed)
ANTICOAGULATION CONSULT NOTE   Pharmacy Consult for Heparin infusion Indication: DVT and limb ischemia  Allergies  Allergen Reactions   Amoxicillin-Pot Clavulanate Rash   Zosyn [Piperacillin Sod-Tazobactam So] Rash    Patient Measurements: Height: '5\' 2"'$  (157.5 cm) Weight: 70.2 kg (154 lb 12.2 oz) IBW/kg (Calculated) : 50.1 Heparin Dosing Weight: 66.3 kg  Vital Signs: Temp: 98.5 F (36.9 C) (01/23 1259) Temp Source: Oral (01/23 0946) BP: 168/62 (01/23 1259) Pulse Rate: 82 (01/23 1259)  Labs: Recent Labs    10/28/22 0512 10/28/22 1249 10/29/22 0256 10/29/22 1745 10/30/22 0924 10/30/22 1140 10/30/22 1856  HGB 9.1*  --  8.7*  --   --  8.6*  --   HCT 28.4*  --  27.7*  --   --  27.0*  --   PLT 303  --  293  --   --  355  --   HEPARINUNFRC 0.44   < > 0.28* <0.10* 0.27*  --  0.29*  CREATININE  --   --  1.77*  --   --  1.45*  --    < > = values in this interval not displayed.     Estimated Creatinine Clearance: 27 mL/min (A) (by C-G formula based on SCr of 1.45 mg/dL (H)).   Medical History: Past Medical History:  Diagnosis Date   Anxiety    Colon cancer ([place])    Dementia ([place]) 2022   Diabetes ([place])    Hypertension    Peripheral vascular disease ([place])     Medications:  No history of chronic anticoagulant use PTA  Assessment: 82 y.o. 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 Patient was seen in the ER on 10/19/22 for evaluation of left leg pain and had an ultrasound which showed a left tibial vein DVT.  She was discharged home and advised to follow-up with vascular surgery who continued her dual antiplatelet therapy and did not place patient  on anticoagulation due to her age and increased risk of falls. Limb is now with purplish/blue discoloration extending from left foot to the ankle. Vascular has been consulted, planning for left lower extremity angiography. Pharmacy has been consulted to initiate and titrate heparin infusion.   10/27/22 '@1725'$ : heparin level < 0.10. subtherapeutic. 10/28/22 '@0512'$ : HL = 0.44, therapeutic X 1  10/28/22 '@1249'$ : HL = 0.43, therapeutic. X 2 10/29/22 '@0256'$ : HL = 0.28, SUBtherapeutic 10/29/22'@1745'$ : HL < 0.10 SUBtherapeutic  10/30/22'@0924'$ : HL = 0.27 subtherapeutic.  10/30/22 '@1856'$  HL = 0.29, Subtherapeutic   Goal of Therapy:  Heparin level 0.3-0.7 units/ml Monitor platelets by anticoagulation protocol: Yes   Plan:  Heparin level is subtherapeutic. Will give heparin bolus of 1000 units x 1 and increase heparin infusion to 1850 unit/hr. Recheck heparin level in 8 hours. CBC daily while on heparin.  Plan on transitioning to apixaban tomorrow [place], PharmD, BCPS Clinical Pharmacist   10/30/2022,7:30 PM

## 2022-10-30 NOTE — Progress Notes (Signed)
Progress Note   Patient: Beth Mcguire YOV:[id number] DOB: 02-Nov-1940 DOA: 10/26/2022     4 DOS: the patient was seen and examined on 10/30/2022   Brief hospital course: 82 y.o. 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 Patient was seen in the ER on 10/19/22 for evaluation of left leg pain and had an ultrasound which showed a left tibial vein DVT.  She was discharged home and advised to follow-up with vascular surgery who continued her dual antiplatelet therapy and did not place patient on anticoagulation due to her age and increased risk of falls. Daughter notes that she complained of worsening back pain on the day of admission and she took off her sock and noted that her left foot had purplish blue discoloration extending from the left foot to the ankle and so she called EMS and patient was brought into the ER for further evaluation. She rates the pain in her leg a 10 x 10 in intensity at its worst. She denies having any chest pain, no shortness of breath, no nausea, no vomiting, no dizziness, no lightheadedness, no headache, no urinary symptoms, no blurred vision no focal deficit Abnormal labs include glucose of 262, white count 14.0, platelet count 403, serum creatinine 1.53 above a baseline of 1.15  Dr. Delana Meyer took to the vascular lab on 10/26/2022.  Angioplasty of the left superficial femoral and popliteal arteries, angioplasty and stent of left posterior tibial, mechanical thrombectomy of the entire SFA and popliteal using Rota Rex device.  1/20.  Left foot demarcated from the low sock line down.  Continue heparin drip.  Chest x-ray showing pneumonia started  Levaquin. 1/21.  Had fever last night.  Will get blood cultures today.  Add vancomycin. 1/22.  Patient complaining of right-sided weakness.  Stat CT scan of the head was negative.  MRI of the brain was negative for acute stroke. 1/23.  Case discussed with vascular team.  They are not going to do any further interventions at this time and recommended continued care consult for her foot.  They are okay to switch over to Eliquis for tomorrow and recommended triple blood thinners.  Assessment and Plan: * Critical limb ischemia of left lower extremity ([place]) Status post procedure 1/19 by Dr. Delana Meyer to try to obtain the best circulation to her left foot.  Left foot demarcated from the low sock line down.  Case discussed with vascular team today and they stated that the foot actually looks better than what it did when she came in.  No further intervention planned.  Okay to switch over to [employer].  Will need aspirin and Plavix also.  Acute right-sided weakness Stat CT scan of the head negative.  MRI negative for stroke.  Appreciate neurology consultation.  Lobar pneumonia ([place]) Started on Levaquin on 1/20.  Will discontinue vancomycin since vascular team thinks the left foot is looking better and not a source of infection.  Acute hypoxic respiratory failure ([place]) Patient had a pulse ox of 89% on 4 L of oxygen.  Now on room air  Acute kidney injury superimposed on CKD ([place]) AKI on CKD stage III A.  Baseline creatinine around 1.15.  She came in at 1.53.  Creatinine 1.77 on  1/22.  Creatinine 1.45 on 1/23..  On IV fluid hydration.   PAD (peripheral artery disease) ([facility]) Continue heparin drip.  Vascular team states we can convert over to Eliquis tomorrow.  Will also need aspirin and Plavix.  Type 2 diabetes mellitus with chronic kidney disease, with long-term current use of insulin ([facility]) Decrease 70/30 insulin 10 units twice a day.  Hold Glucophage.  Last hemoglobin A1c  7.4.  Hypertension Hold antihypertensive medications  Tachycardia Continue gentle fluids and as needed metoprolol IV for tachycardia.  This has improved.  Anemia, unspecified Hemoglobin down to 8.6.  Check a ferritin.  Continue to watch with giving IV fluids.  Vascular surgery recommending triple blood thinners which would make her higher risk for bleeding.        Subjective: Patient feeling little bit better today.  Was admitted with ischemic left foot and then yesterday had right-sided weakness and CT and MRI were negative for stroke.  Physical Exam: Vitals:   10/29/22 2308 10/30/22 0435 10/30/22 0946 10/30/22 1259  BP: (!) 139/46 (!) 136/52 (!) 161/60 (!) 168/62  Pulse: 77 77 65 82  Resp: '20 18 20 18  '$ Temp: 98.6 F (37 C)  98.4 F (36.9 C) 98.5 F (36.9 C)  TempSrc:   Oral   SpO2: 98% 97% 99% 97%  Weight:      Height:       Physical Exam HENT:     Head: Normocephalic.     Mouth/Throat:     Pharynx: No oropharyngeal exudate.  Eyes:     General: Lids are normal.     Conjunctiva/sclera: Conjunctivae normal.  Cardiovascular:     Rate and Rhythm: Regular rhythm. Tachycardia present.     Heart sounds: Normal heart sounds, S1 normal and S2 normal.  Pulmonary:     Breath sounds: No decreased breath sounds, wheezing, rhonchi or rales.  Abdominal:     Palpations: Abdomen is soft.     Tenderness: There is no abdominal tenderness.  Musculoskeletal:     Right lower leg: Swelling present.     Left lower leg: Swelling present.  Skin:    General: Skin is warm.     Comments: Left foot demarcated purpleish reddish From a low sock line down.  Blistering on her foot.  Neurological:     Mental Status: She is alert.     Comments: Answers questions appropriately.  Unable to straight leg raise with right leg but able to flex up and down with the ankle.  Able to have grip strength but unable to lift her right arm this morning.  Right facial droop     Data Reviewed: Creatinine  1.45, white blood cell count 16, hemoglobin 8.6, platelet count 355  Family Communication: Updated patient's daughter on the phone  Disposition: Status is: Inpatient Remains inpatient appropriate because: Being followed closely for anemia, ischemic left foot, had symptoms of right-sided weakness yesterday.  Physical therapy recommending rehab but patient's daughter would like to take home.  Planned Discharge Destination: Home with Home Health    Time spent: 28 minutes Case discussed with vascular surgery team.  Author: Loletha Grayer, MD 10/30/2022 3:39 PM  For on call review [email or web address].

## 2022-10-30 NOTE — Consult Note (Signed)
[facility] for Heparin infusion Indication: DVT and limb ischemia  Allergies  Allergen Reactions   Amoxicillin-Pot Clavulanate Rash   Zosyn [Piperacillin Sod-Tazobactam So] Rash    Patient Measurements: Height: '5\' 2"'$  (157.5 cm) Weight: 70.2 kg (154 lb 12.2 oz) IBW/kg (Calculated) : 50.1 Heparin Dosing Weight: 66.3 kg  Vital Signs: Temp: 98.4 F (36.9 C) (01/23 0946) Temp Source: Oral (01/23 0946) BP: 161/60 (01/23 0946) Pulse Rate: 65 (01/23 0946)  Labs: Recent Labs    10/28/22 0512 10/28/22 1249 10/29/22 0256 10/29/22 1745 10/30/22 0924  HGB 9.1*  --  8.7*  --   --   HCT 28.4*  --  27.7*  --   --   PLT 303  --  293  --   --   HEPARINUNFRC 0.44   < > 0.28* <0.10* 0.27*  CREATININE  --   --  1.77*  --   --    < > = values in this interval not displayed.     Estimated Creatinine Clearance: 22.1 mL/min (A) (by C-G formula based on SCr of 1.77 mg/dL (H)).   Medical History: Past Medical History:  Diagnosis Date   Anxiety    Colon cancer ([place])    Dementia ([place]) 2022   Diabetes ([place])    Hypertension    Peripheral vascular disease ([place])     Medications:  No history of chronic anticoagulant use PTA  Assessment: 82 y.o. 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 Patient was seen in the ER on 10/19/22 for evaluation of left leg pain and had an ultrasound which showed a left tibial vein DVT.  She was discharged home and advised to follow-up with vascular surgery who continued her dual antiplatelet therapy and did not place patient on anticoagulation due to her age and increased risk of falls. Limb is now with  purplish/blue discoloration extending from left foot to the ankle. Vascular has been consulted, planning for left lower extremity angiography. Pharmacy has been consulted to initiate and titrate heparin infusion.   10/27/22 '@1725'$ : heparin level < 0.10. subtherapeutic. 10/28/22 '@0512'$ : HL = 0.44, therapeutic X 1  10/28/22 '@1249'$ : HL = 0.43, therapeutic. X 2 10/29/22 '@0256'$ : HL = 0.28, SUBtherapeutic 10/29/22'@1745'$ : HL < 0.10 SUBtherapeutic  10/30/22'@0924'$ : HL = 0.27 subtherapeutic.    Goal of Therapy:  Heparin level 0.3-0.7 units/ml Monitor platelets by anticoagulation protocol: Yes   Plan:  Heparin level is therapeutic. Will give heparin bolus of 1000 units x 1 and increase heparin infusion to 1700 unit/hr. Recheck heparin level in 8 hours. CBC daily while on heparin.    Oswald Hillock, PharmD, BCPS Clinical Pharmacist   10/30/2022,10:17 AM

## 2022-10-30 NOTE — Consult Note (Signed)
[facility] Nurse Consult Note: Reason for Consult: left limb ischemia Patient presented with discoloration of the left foot and weeping from the dorsal foot Underwent limited re-vascularization 10/26/22 Wound type: PAD Pressure Injury POA: NA Measurement: see nursing flow sheets Wound bed: ruptured blister; pink, moist Drainage (amount, consistency, odor) none documented Periwound: edema and discoloration Dressing procedure/placement/frequency: Single layer xeroform per the nursing skin care order set, top with foam. Change every other day.   Discussed POC with patient and bedside nurse.  Re consult if needed, will not follow at this time. Thanks  Solei Wubben [employer], RN,CWOCN, CNS, [place] [phone])

## 2022-10-31 ENCOUNTER — Other Ambulatory Visit (HOSPITAL_COMMUNITY): Payer: Self-pay

## 2022-10-31 DIAGNOSIS — J181 Lobar pneumonia, unspecified organism: Secondary | ICD-10-CM | POA: Diagnosis not present

## 2022-10-31 DIAGNOSIS — Z7189 Other specified counseling: Secondary | ICD-10-CM | POA: Diagnosis not present

## 2022-10-31 DIAGNOSIS — I70222 Atherosclerosis of native arteries of extremities with rest pain, left leg: Secondary | ICD-10-CM | POA: Diagnosis not present

## 2022-10-31 DIAGNOSIS — I739 Peripheral vascular disease, unspecified: Secondary | ICD-10-CM | POA: Diagnosis not present

## 2022-10-31 LAB — FERRITIN: Ferritin: 135 ng/mL (ref 11–307)

## 2022-10-31 LAB — CBC
HCT: 25.3 % — ABNORMAL LOW (ref 36.0–46.0)
Hemoglobin: 8.2 g/dL — ABNORMAL LOW (ref 12.0–15.0)
MCH: 28.5 pg (ref 26.0–34.0)
MCHC: 32.4 g/dL (ref 30.0–36.0)
MCV: 87.8 fL (ref 80.0–100.0)
Platelets: 345 10*3/uL (ref 150–400)
RBC: 2.88 MIL/uL — ABNORMAL LOW (ref 3.87–5.11)
RDW: 13.7 % (ref 11.5–15.5)
WBC: 14.2 10*3/uL — ABNORMAL HIGH (ref 4.0–10.5)
nRBC: 0 % (ref 0.0–0.2)

## 2022-10-31 LAB — BASIC METABOLIC PANEL
Anion gap: 7 (ref 5–15)
BUN: 41 mg/dL — ABNORMAL HIGH (ref 8–23)
CO2: 17 mmol/L — ABNORMAL LOW (ref 22–32)
Calcium: 8.3 mg/dL — ABNORMAL LOW (ref 8.9–10.3)
Chloride: 109 mmol/L (ref 98–111)
Creatinine, Ser: 1.3 mg/dL — ABNORMAL HIGH (ref 0.44–1.00)
GFR, Estimated: 41 mL/min — ABNORMAL LOW (ref >60)
Glucose, Bld: 301 mg/dL — ABNORMAL HIGH (ref 70–99)
Potassium: 3.7 mmol/L (ref 3.5–5.1)
Sodium: 133 mmol/L — ABNORMAL LOW (ref 135–145)

## 2022-10-31 LAB — GLUCOSE, CAPILLARY
Glucose-Capillary: 183 mg/dL — ABNORMAL HIGH (ref 70–99)
Glucose-Capillary: 186 mg/dL — ABNORMAL HIGH (ref 70–99)
Glucose-Capillary: 207 mg/dL — ABNORMAL HIGH (ref 70–99)
Glucose-Capillary: 227 mg/dL — ABNORMAL HIGH (ref 70–99)
Glucose-Capillary: 294 mg/dL — ABNORMAL HIGH (ref 70–99)
Glucose-Capillary: 370 mg/dL — ABNORMAL HIGH (ref 70–99)

## 2022-10-31 LAB — HEPARIN LEVEL (UNFRACTIONATED)
Heparin Unfractionated: 0.39 IU/mL (ref 0.30–0.70)
Heparin Unfractionated: 0.75 IU/mL — ABNORMAL HIGH (ref 0.30–0.70)

## 2022-10-31 MED ORDER — INSULIN ASPART 100 UNIT/ML IJ SOLN
0.0000 [IU] | Freq: Three times a day (TID) | INTRAMUSCULAR | Status: DC
  Administered 2022-10-31 – 2022-11-02 (×4): 1 [IU] via SUBCUTANEOUS
  Filled 2022-10-31 (×4): qty 1

## 2022-10-31 MED ORDER — SODIUM CHLORIDE 0.9 % IV SOLN
2.0000 g | INTRAVENOUS | Status: AC
  Administered 2022-10-31 – 2022-11-01 (×2): 2 g via INTRAVENOUS
  Filled 2022-10-31 (×2): qty 20

## 2022-10-31 MED ORDER — HEPARIN (PORCINE) 25000 UT/250ML-% IV SOLN
1850.0000 [IU]/h | INTRAVENOUS | Status: DC
  Administered 2022-10-31: 1850 [IU]/h via INTRAVENOUS
  Filled 2022-10-31: qty 250

## 2022-10-31 NOTE — Progress Notes (Signed)
[facility]) Hospital Liaison Note  Received request from Transitions of Care Manager, Donny Pique, for hospice services at home after discharge. Chart and patient information under review by [facility] physician.   Spoke with daughter/Bonnie to initiate education related to hospice philosophy, services, and team approach to care. Horris Latino verbalized understanding of information given. Per discussion, the plan is for patient to discharge home via [facility] once cleared to DC.    DME needs discussed. Patient has the following equipment in the home: TBD Patient requests the following equipment for delivery: O2 @ 2L BST Hospital Bed  Address verified and is correct in the chart. Horris Latino is the family member to contact to arrange time of equipment delivery.    Please send signed and completed DNR home with patient/family. Please provide prescriptions at discharge as needed to ensure ongoing symptom management.    [facility] information and contact numbers given to family & above information shared with TOC.   Please call with any questions/concerns.    Thank you for the opportunity to participate in this patient's care.   Phillis Haggis, MSW [place] Hospital Liaison  [phone]

## 2022-10-31 NOTE — TOC Progression Note (Signed)
Transition of Care [facility]) - Progression Note    Patient Details  Name: Beth Mcguire MRN: [id number] Date of Birth: 03/23/1941  [facility]) CM/SW Contact  Laurena Slimmer, RN Phone Number: 10/31/2022, 2:49 PM  Clinical Narrative:    Spoke with patient's daughter at the bedside regarding discharge planning. She was initially open to SNF but is also interested in hospice at home. Horris Latino would prefer [facility]. Referral made to [facility] from [employer]        Expected Discharge Plan and Services                                               Social Determinants of Health (SDOH) Interventions SDOH Screenings   Food Insecurity: No Food Insecurity (10/26/2022)  Housing: Low Risk  (10/26/2022)  Transportation Needs: No Transportation Needs (10/26/2022)  Utilities: Not At Risk (10/26/2022)  Tobacco Use: Low Risk  (10/29/2022)    Readmission Risk Interventions     No data to display

## 2022-10-31 NOTE — Care Management Important Message (Signed)
Important Message  Patient Details  Name: Beth Mcguire MRN: [id number] Date of Birth: [birth year]   Medicare Important Message Given:  Yes     Dannette Barbara 10/31/2022, 2:58 PM

## 2022-10-31 NOTE — TOC Benefit Eligibility Note (Signed)
Patient Teacher, English as a foreign language completed.    The patient is currently admitted and upon discharge could be taking Eliquis 5 mg.  The current 30 day co-pay is $0.00.   The patient is insured through [place], [place] Patient Advocate Specialist [place] Patient Advocate Team Direct Number: [phone]  Fax: [phone]

## 2022-10-31 NOTE — Progress Notes (Signed)
Mobility Specialist - Progress Note   10/31/22 1119  Mobility  Activity Transferred from bed to chair  Level of Assistance +2 (takes two people)  Assistive Device Other (Comment)  Distance Ambulated (ft) 0 ft  Activity Response Tolerated fair  $Mobility charge 1 Mobility     Pt sleeping in bed upon arrival, utilizing RA. Pt awakened to voice and agreeable to transfer to recliner for breakfast. Pt falls asleep at times during session but verbally responsive throughout. Pt initially denied pain, but does yell out when gently touching skin or lines/tubes. Pt slide transfer to chair with totalA +2. HR/O2 WFL. Pt left in chair with alarm set, needs in reach. Breakfast tray set up in front of pt. RN notified.    Kathee Delton Mobility Specialist 10/31/22, 11:30 AM

## 2022-10-31 NOTE — Inpatient Diabetes Management (Signed)
Inpatient Diabetes Program Recommendations  AACE/ADA: New Consensus Statement on Inpatient Glycemic Control (2015)  Target Ranges:  Prepandial:   less than 140 mg/dL      Peak postprandial:   less than 180 mg/dL (1-2 hours)      Critically ill patients:  140 - 180 mg/dL   Lab Results  Component Value Date   GLUCAP 227 (H) 10/31/2022   HGBA1C 7.4 (H) 05/29/2022    Latest Reference Range & Units 10/30/22 04:20 10/30/22 08:53 10/30/22 12:57 10/30/22 17:13 10/30/22 20:18 10/31/22 00:22 10/31/22 03:07 10/31/22 08:56 10/31/22 11:48  Glucose-Capillary 70 - 99 mg/dL 90 85 214 (H) 319 (H) 376 (H) 370 (H) 294 (H) 207 (H) 227 (H)  (H): Data is abnormally high  Diabetes history: DM2 Outpatient Diabetes medications: 70/30 28 units ac breakfast, 22 units ac supper Current orders for Inpatient glycemic control: 70/30 10 units ac bid  Inpatient Diabetes Program Recommendations:   Noted hyperglycemia today. -Add Novolog 0-6 units tid + hs 0-5 units correction. Will follow while inpatient.  Thank you, Nani Gasser. Arayna Illescas, RN, MSN, CDE  Diabetes Coordinator Inpatient Glycemic Control Team Team Pager [phone] (8am-5pm) 10/31/2022 12:27 PM

## 2022-10-31 NOTE — Consult Note (Addendum)
Consultation Note Date: 10/31/2022   Patient Name: Beth Mcguire  DOB: [birth year]  MRN: [id number]  Age / Sex: 82 y.o., female  PCP: [name], MD Referring Physician: Ezekiel Slocumb, DO  Reason for Consultation: Establishing goals of care  HPI/Patient Profile: 82 y.o. 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 Patient was seen in the ER on 10/19/22 for evaluation of left leg pain and had an ultrasound which showed a left tibial vein DVT.  She was discharged home and advised to follow-up with vascular surgery who continued her dual antiplatelet therapy and did not place patient on anticoagulation due to her age and increased risk of falls. Daughter notes that she complained of worsening back pain on the day of admission and she took off her sock and noted that her left foot had purplish blue discoloration extending from the left foot to the ankle and so she called EMS and patient was brought into the ER for further evaluation.  Clinical Assessment and Goals of Care: Notes and labs reviewed.  In to see patient.  Patient's daughter is at bedside with her.  Patient is confused.  Daughter states she moved in with her mother (patient) and father around 3 years ago to help them.  She states her mother was widowed around a year ago, and she is now her mother's full-time caretaker.  Daughter states patient has 2 sons.  She states at baseline her mother does not really leave the house except for to go to doctors appointments.  She states she has a bedside commode that she only has to take a few  steps to get to.  She states at baseline, her mother has always been "mean", which was the opposite of her father.  She states her mother can be mean and hurtful in her interactions with others.  She states she prior to most both her mother and father that she would not put them in a nursing facility.  She states her father died at home with hospice care.  Daughter verbalizes concern for her mother's ability to provide any level of care for herself.  We discussed her diagnoses, prognosis, GOC, EOL wishes disposition and options.  Created space and opportunity for patient  to explore thoughts and feelings regarding current medical information.   A detailed discussion was had today regarding advanced directives.  Concepts specific to code status, artifical feeding and hydration, IV antibiotics and rehospitalization were discussed.  The difference between an aggressive medical intervention path and a comfort care path was discussed.  Values and goals of care important to patient and family were attempted to be elicited.  Discussed limitations of medical interventions to prolong quality of life in some situations and discussed the concept of human  mortality.  She states her mother would want to continue a DNR/DNI status.  No feeding tube.  Continue to treat the treatable at this time.  Daughter would like for her mother to proceed to skilled nursing in order to gain strength and work on mobility.If things do not go well, or if she declines, she would want to transition to hospice level care.    SUMMARY OF RECOMMENDATIONS   Recommend palliative to follow at discharge with transition to hospice when patient and family are ready.  Prognosis:  Unable to determine       Primary Diagnoses: Present on Admission:  Critical limb ischemia of left lower extremity ([place])  PAD (peripheral artery disease) ([place])  Hypertension  Acute kidney injury superimposed on CKD ([place])  Anemia, unspecified   I have  reviewed the medical record, interviewed the patient and family, and examined the patient. The following aspects are pertinent.  Past Medical History:  Diagnosis Date   Anxiety    Colon cancer ([place])    Dementia ([place]) 2022   Diabetes ([place])    Hypertension    Peripheral vascular disease ([place])    Social History   Socioeconomic History   Marital status: Widowed    Spouse name: Not on file   Number of children: 3   Years of education: Not on file   Highest education level: Not on file  Occupational History   Not on file  Tobacco Use   Smoking status: Never    Passive exposure: Never   Smokeless tobacco: Never  Substance and Sexual Activity   Alcohol use: No    Alcohol/week: 0.0 standard drinks of alcohol   Drug use: No   Sexual activity: Not Currently  Other Topics Concern   Not on file  Social History Narrative   Daughter And her 2 kids    lives with pt.    Social Determinants of Health   Financial Resource Strain: Not on file  Food Insecurity: No Food Insecurity (10/26/2022)   Hunger Vital Sign    Worried About Running Out of Food in the Last Year: Never true    Ran Out of Food in the Last Year: Never true  Transportation Needs: No Transportation Needs (10/26/2022)   PRAPARE - Hydrologist (Medical): No    Lack of Transportation (Non-Medical): No  Physical Activity: Not on file  Stress: Not on file  Social Connections: Not on file   Family History  Problem Relation Age of Onset   Diabetes Other    Scheduled Meds:  (feeding supplement) PROSource Plus  30 mL Oral TID BM   apixaban  2.5 mg Oral BID   vitamin C  500 mg Oral BID   aspirin EC  81 mg Oral QHS   atorvastatin  10 mg Oral QPM   clopidogrel  75 mg Oral Daily   divalproex  250 mg Oral Daily   insulin aspart  0-6 Units Subcutaneous TID WC   insulin aspart protamine- aspart  10 Units Subcutaneous BID WC   multivitamin with minerals  1 tablet Oral Daily   QUEtiapine  50 mg  Oral QPM   sodium chloride flush  3 mL Intravenous Q12H   zinc sulfate  220 mg Oral Daily   Continuous Infusions:  sodium chloride     sodium chloride 40 mL/hr at 10/30/22 0435   heparin 1,850 Units/hr (10/31/22 1421)   levofloxacin (LEVAQUIN) IV Stopped (10/29/22 2241)   PRN Meds:.sodium chloride, metoprolol  tartrate, morphine injection, ondansetron (ZOFRAN) IV, mouth rinse, oxyCODONE, sodium chloride flush Medications Prior to Admission:  Prior to Admission medications   Medication Sig Start Date End Date Taking? Authorizing Provider  aspirin 81 MG tablet Take 1 tablet (81 mg total) by mouth daily. 01/06/16  Yes Sudini, Alveta Heimlich, MD  atorvastatin (LIPITOR) 10 MG tablet Take 10 mg every evening by mouth.    Yes [provider]  clopidogrel (PLAVIX) 75 MG tablet TAKE 1 TABLET BY MOUTH EVERY DAY 09/22/20  Yes Kris Hartmann, NP  divalproex (DEPAKOTE ER) 250 MG 24 hr tablet Take 250 mg by mouth daily.   Yes [provider]  lisinopril (ZESTRIL) 10 MG tablet Take 10 mg by mouth in the morning.   Yes [provider]  loratadine (CLARITIN REDITABS) 10 MG dissolvable tablet Take 10 mg by mouth daily as needed for allergies.   Yes [provider]  metFORMIN (GLUCOPHAGE-XR) 500 MG 24 hr tablet Take 500 mg by mouth daily with supper.   Yes [provider]  NOVOLOG MIX 70/30 FLEXPEN (70-30) 100 UNIT/ML FlexPen Inject 22-28 Units into the skin See admin instructions. Inject 28u under the skin ever morning after breakfast and inject 22u under the skin every evening after supper   Yes [provider]  QUEtiapine (SEROQUEL) 50 MG tablet Take 50 mg by mouth every evening.   Yes [provider]  HYDROcodone-acetaminophen (NORCO/VICODIN) 5-325 MG tablet Take 1 tablet by mouth every 4 (four) hours as needed for moderate pain. Patient not taking: Reported on 10/26/2022    [provider]  traMADol (ULTRAM) 50 MG tablet Take 1 tablet (50 mg  total) by mouth every 6 (six) hours as needed. Patient not taking: Reported on 10/26/2022 10/22/22   Kris Hartmann, NP   Allergies  Allergen Reactions   Amoxicillin-Pot Clavulanate Rash   Zosyn [Piperacillin Sod-Tazobactam So] Rash   Review of Systems  Unable to perform ROS   Physical Exam Pulmonary:     Effort: Pulmonary effort is normal.  Neurological:     Mental Status: She is alert.     Vital Signs: BP 134/60 (BP Location: Right Arm)   Pulse 75   Temp (!) 97.5 F (36.4 C) (Oral)   Resp 16   Ht '5\' 2"'$  (1.575 m)   Wt 70.2 kg   SpO2 90%   BMI 28.31 kg/m  Pain Scale: 0-10 POSS *See Group Information*: S-Acceptable,Sleep, easy to arouse Pain Score: 0-No pain   SpO2: SpO2: 90 % O2 Device:SpO2: 90 % O2 Flow Rate: .O2 Flow Rate (L/min): 2 L/min  IO: Intake/output summary:  Intake/Output Summary (Last 24 hours) at 10/31/2022 1441 Last data filed at 10/31/2022 0600 Gross per 24 hour  Intake 240 ml  Output --  Net 240 ml    LBM: Last BM Date : 10/26/22 Baseline Weight: Weight: 70.2 kg Most recent weight: Weight: 70.2 kg      Signed by: Asencion Gowda, NP   Please contact Palliative Medicine Team phone at [phone] for questions and concerns.  For individual provider: See Shea Evans

## 2022-10-31 NOTE — Progress Notes (Addendum)
Progress Note   Patient: Beth Mcguire YCX:[id number] DOB: 02/01/1941 DOA: 10/26/2022     5 DOS: the patient was seen and examined on 10/31/2022   Brief hospital course: 82 y.o. 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 Patient was seen in the ER on 10/19/22 for evaluation of left leg pain and had an ultrasound which showed a left tibial vein DVT.  She was discharged home and advised to follow-up with vascular surgery who continued her dual antiplatelet therapy and did not place patient on anticoagulation due to her age and increased risk of falls. Daughter notes that she complained of worsening back pain on the day of admission and she took off her sock and noted that her left foot had purplish blue discoloration extending from the left foot to the ankle and so she called EMS and patient was brought into the ER for further evaluation. She rates the pain in her leg a 10 x 10 in intensity at its worst. She denies having any chest pain, no shortness of breath, no nausea, no vomiting, no dizziness, no lightheadedness, no headache, no urinary symptoms, no blurred vision no focal deficit Abnormal labs include glucose of 262, white count 14.0, platelet count 403, serum creatinine 1.53 above a baseline of 1.15  Dr. Delana Meyer took to the vascular lab on 10/26/2022.  Angioplasty of the left superficial femoral and popliteal arteries, angioplasty and stent of left posterior tibial, mechanical thrombectomy of the entire SFA and popliteal using Rota Rex device.  1/20.  Left foot demarcated from the low sock line down.  Continue heparin drip.  Chest x-ray showing pneumonia started  Levaquin. 1/21.  Had fever last night.  Will get blood cultures today.  Add vancomycin. 1/22.  Patient complaining of right-sided weakness.  Stat CT scan of the head was negative.  MRI of the brain was negative for acute stroke. 1/23.  Case discussed with vascular team.  They are not going to do any further interventions at this time and recommended continued care consult for her foot.  They are okay to switch over to Eliquis for tomorrow and recommended triple blood thinners.  Assessment and Plan: * Critical limb ischemia of left lower extremity ([place]) Status post procedure 1/19 by Dr. Delana Meyer to try to obtain the best circulation to her left foot.  Left foot demarcated from the low sock line down.  Dr. Leslye Peer discussed with vascular team and they stated that the foot actually looks better than what it did when she came in.  No further intervention planned.   Transition heparin drip to Eliquis today. Also needs aspirin and Plavix.  Acute right-sided weakness Stat CT scan of the head negative.  MRI negative for stroke.  Appreciate neurology consultation.  Lobar pneumonia ([place]) Continue Levaquin (started on 1/20).  Will discontinue vancomycin since vascular team thinks the left foot is looking better and not a source of infection.  Acute hypoxic respiratory failure ([place]) Patient had a pulse ox of 89% on 4 L of oxygen.  Weaned off O2, stable sats on room air  Acute kidney injury superimposed on CKD ([place]) AKI on CKD stage III A.  Baseline creatinine around 1.15.   Cr on admission was 1.53.  Creatinine 1.77 >> 1.45 >> 1.30.   On IV fluid hydration.   PAD (peripheral artery disease) ([facility]) Transition heparin drip >> Eliquis.   Continue aspirin and Plavix.  Type 2 diabetes mellitus with chronic kidney disease, with long-term current use of insulin ([facility]) Decrease 70/30 insulin 10 units twice a day.  Add 0-6 units sliding scale Novolog.  Hold Glucophage.  Last hemoglobin A1c  7.4.  Hypertension Hold antihypertensive medications  Decubitus ulcer of sacral region, stage 1 Present on admission, see full description below.  Tachycardia Continue gentle fluids and as needed metoprolol IV for tachycardia.  This has improved.  Anemia, unspecified Hemoglobin down to 8.6.  Ferritin normal 135.  Continue to watch with giving IV fluids.  Vascular surgery recommending triple blood thinners which would make her higher risk for bleeding.        Subjective: Patient in recliner eating applesauce, daughter at bedside.  Daughter wants to take pt home with hospice, declines SNF stating she promised patient she would never place her in a facility after a bad experience a family member had at a facility.  Pt denies complaints including foot pain.  Daughter reports intermittent agitation.    Physical Exam: Vitals:   10/31/22 0026 10/31/22 0311 10/31/22 0802 10/31/22 1211  BP: (!) 136/51 (!) 136/57 112/76 134/60  Pulse: 82 80 100 75  Resp: '20 20 16 16  '$ Temp: 99.2 F (37.3 C) 98.5 F (36.9 C) 98 F (36.7 C) (!) 97.5 F (36.4 C)  TempSrc: Axillary Oral Oral Oral  SpO2: 100% 100% 92% 90%  Weight:      Height:       General exam: awake, no acute distress, confused HEENT: moist mucus membranes, hearing grossly normal  Respiratory system: CTAB with referred coarse upper airway sounds, no wheezes, normal respiratory effort. Cardiovascular system: normal S1/S2, RRR,  Gastrointestinal system: soft, NT, ND Central nervous system: no gross focal neurologic deficits, normal speech Extremities: left foot and lwoer extremity edema, blister on superior aspect of left foot Skin: dry, intact, normal temperature, left foot darkening and erythema that appears to be receding Psychiatry: normal mood, congruent affect, abnormal judgement and insight due to dementia     Data Reviewed:   Notable labs: Sodium 133, bicarb 17, glucose 301, BUN 41, creatinine 1.30 improved from 1.45.   White count improved 14.2 from 16.0.  Hemoglobin 8.2  Family Communication: Updated patient's daughter at bedside on rounds  Disposition: Status is: Inpatient Remains inpatient appropriate because: Being followed closely for anemia, ischemic left foot, remains on heparin.  Physical therapy recommending rehab but patient's daughter would like to take home with hospice   Planned Discharge Destination: Home with Home Health    Time spent: 36 minutes   Author: Ezekiel Slocumb, DO 10/31/2022 2:35 PM  For on call review [email or web address].

## 2022-10-31 NOTE — Consult Note (Signed)
ANTICOAGULATION CONSULT NOTE   Pharmacy Consult for Heparin Infusion Indication: DVT and limb ischemia  Patient Measurements: Height: '5\' 2"'$  (157.5 cm) Weight: 70.2 kg (154 lb 12.2 oz) IBW/kg (Calculated) : 50.1 Heparin Dosing Weight: 66.3 kg  Labs: Recent Labs    10/29/22 0256 10/29/22 1745 10/30/22 0924 10/30/22 1140 10/30/22 1856 10/31/22 0350 10/31/22 1242  HGB 8.7*  --   --  8.6*  --  8.2*  --   HCT 27.7*  --   --  27.0*  --  25.3*  --   PLT 293  --   --  355  --  345  --   HEPARINUNFRC 0.28*   < > 0.27*  --  0.29*  --  0.39  CREATININE 1.77*  --   --  1.45*  --  1.30*  --    < > = values in this interval not displayed.    Estimated Creatinine Clearance: 30.1 mL/min (A) (by C-G formula based on SCr of 1.3 mg/dL (H)).  Medical History: Past Medical History:  Diagnosis Date   Anxiety    Colon cancer ([place])    Dementia ([place]) 2022   Diabetes ([place])    Hypertension    Peripheral vascular disease ([place])     Medications:  No history of chronic anticoagulant use PTA  Assessment: 82 y.o. 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 Patient was seen in the ER on 10/19/22 for evaluation of left leg pain and had an ultrasound which showed a left tibial vein DVT.  She was discharged home and advised to follow-up with vascular surgery who continued her dual antiplatelet therapy and did not place patient on anticoagulation due to her age and increased risk of falls. Limb is now with purplish/blue discoloration extending from left foot to the ankle. Vascular has been consulted, planning for left lower extremity angiography. Pharmacy has been consulted to initiate  and titrate heparin infusion.  10/27/22 '@1725'$ : HL < 0.10, SUBtherapeutic 10/28/22 '@0512'$ : HL = 0.44, Therapeutic x 1  10/28/22 '@1249'$ : HL = 0.43, Therapeutic x 2 10/29/22 '@0256'$ : HL = 0.28, SUBtherapeutic 10/29/22'@1745'$ : HL < 0.10, SUBtherapeutic  10/30/22'@0924'$ : HL = 0.27, SUBtherapeutic  10/30/22 '@1856'$  HL = 0.29, SUBtherapeutic 10/31/22 '@1242'$  HL = 0.39, Therapeutic x 1  Goal of Therapy:  Heparin level 0.3-0.7 units/ml Monitor platelets by anticoagulation protocol: Yes   Plan:  --Plan was to switch patient from IV heparin to apixaban today, 10/31/22, however, per discussion with RN, patient refused all PO medications this morning --Heparin level is therapeutic x 1 --Will continue with IV heparin at 1850 units/hr for now --Re-check confirmatory HL in 8 hours --Daily CBC per protocol while on IV heparin --Plan to discontinue heparin when patient agreeable to take apixaban  Benita Gutter  10/31/2022,2:06 PM

## 2022-10-31 NOTE — Consult Note (Addendum)
ANTICOAGULATION CONSULT NOTE   Pharmacy Consult for Heparin Infusion Indication: DVT and limb ischemia  Patient Measurements: Height: '5\' 2"'$  (157.5 cm) Weight: 70.2 kg (154 lb 12.2 oz) IBW/kg (Calculated) : 50.1 Heparin Dosing Weight: 66.3 kg  Labs: Recent Labs    10/29/22 0256 10/29/22 1745 10/30/22 1140 10/30/22 1856 10/31/22 0350 10/31/22 1242 10/31/22 2042  HGB 8.7*  --  8.6*  --  8.2*  --   --   HCT 27.7*  --  27.0*  --  25.3*  --   --   PLT 293  --  355  --  345  --   --   HEPARINUNFRC 0.28*   < >  --  0.29*  --  0.39 0.75*  CREATININE 1.77*  --  1.45*  --  1.30*  --   --    < > = values in this interval not displayed.    Estimated Creatinine Clearance: 30.1 mL/min (A) (by C-G formula based on SCr of 1.3 mg/dL (H)).  Medical History: Past Medical History:  Diagnosis Date   Anxiety    Colon cancer ([place])    Dementia ([place]) 2022   Diabetes ([place])    Hypertension    Peripheral vascular disease ([place])     Medications:  No history of chronic anticoagulant use PTA  Assessment: 82 y.o. 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 Patient was seen in the ER on 10/19/22 for evaluation of left leg pain and had an ultrasound which showed a left tibial vein DVT.  She was discharged home and advised to follow-up with vascular surgery who continued her dual antiplatelet therapy and did not place patient on anticoagulation due to her age and increased risk of falls. Limb is now with purplish/blue discoloration extending from left foot to the ankle. Vascular has been consulted, planning for left lower extremity angiography. Pharmacy has been consulted to initiate  and titrate heparin infusion.  10/27/22 '@1725'$ : HL < 0.10, SUBtherapeutic 10/28/22 '@0512'$ : HL = 0.44, Therapeutic x 1  10/28/22 '@1249'$ : HL = 0.43, Therapeutic x 2 10/29/22 '@0256'$ : HL = 0.28, SUBtherapeutic 10/29/22'@1745'$ : HL < 0.10, SUBtherapeutic  10/30/22'@0924'$ : HL = 0.27, SUBtherapeutic  10/30/22 '@1856'$  HL = 0.29, SUBtherapeutic 10/31/22 '@1242'$  HL = 0.39, Therapeutic x 1 10/31/22 @ 2045 HL = 0.75, SUPRAtherapeutic  .asmhepad  Goal of Therapy:  Heparin level 0.3-0.7 units/ml Monitor platelets by anticoagulation protocol: Yes   Plan:  --Patient taking PO eliquis. D/c heparin infusion  Darrick Penna  10/31/2022,9:51 PM

## 2022-10-31 NOTE — Progress Notes (Signed)
Progress Note    10/31/2022 2:40 PM 5 Days Post-Op  Subjective:  82 y.o. 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    Patient underwent on 10/26/22 Angioplasty of the left superficial femoral and popliteal arteries, angioplasty and stent of left posterior tibial, mechanical thrombectomy of the entire SFA and popliteal arteries.     Today on evaluation patient states she feels much better overall. Sitting in the bedside chair. States her left leg feels better and her foot feels much warmer. Denies any resting pain. No complaints overnight and all her vitals remain the same.    Vitals:   10/31/22 0802 10/31/22 1211  BP: 112/76 134/60  Pulse: 100 75  Resp: 16 16  Temp: 98 F (36.7 C) (!) 97.5 F (36.4 C)  SpO2: 92% 90%   Physical Exam: Cardiac:  RRR Lungs:  Clear but diminished in the bases   Incisions:  Left groin C/D/I No complications to note  Extremities:  Left Lower remains slightly ecchymotic with blistering of the skin in multiple small places. Foot is pink and warm today. Post Tibial pulse is strong by doppler today.   Abdomen:   Positive bowel sounds, soft, NT/ND   Neurologic: AAOX3  CBC    Component Value Date/Time   WBC 14.2 (H) 10/31/2022 0350   RBC 2.88 (L) 10/31/2022 0350   HGB 8.2 (L) 10/31/2022 0350   HGB 13.2 12/04/2013 0953   HCT 25.3 (L) 10/31/2022 0350   HCT 41.0 12/04/2013 0953   PLT 345 10/31/2022 0350   PLT 324 12/04/2013 0953   MCV 87.8 10/31/2022 0350   MCV 85 12/04/2013 0953   MCH 28.5 10/31/2022 0350   MCHC 32.4 10/31/2022 0350   RDW 13.7 10/31/2022 0350   RDW 14.1 12/04/2013 0953   LYMPHSABS 1.2 10/26/2022 1042    LYMPHSABS 2.3 12/04/2013 0953   MONOABS 0.7 10/26/2022 1042   MONOABS 0.4 12/04/2013 0953   EOSABS 0.1 10/26/2022 1042   EOSABS 0.2 12/04/2013 0953   BASOSABS 0.1 10/26/2022 1042   BASOSABS 0.1 12/04/2013 0953    BMET    Component Value Date/Time   NA 133 (L) 10/31/2022 0350   NA 142 11/13/2011 1110   K 3.7 10/31/2022 0350   K 4.6 11/13/2011 1110   CL 109 10/31/2022 0350   CL 106 11/13/2011 1110   CO2 17 (L) 10/31/2022 0350   CO2 28 11/13/2011 1110   GLUCOSE 301 (H) 10/31/2022 0350   GLUCOSE 123 (H) 11/13/2011 1110   BUN 41 (H) 10/31/2022 0350   BUN 12 11/13/2011 1110   CREATININE 1.30 (H) 10/31/2022 0350   CREATININE 1.10 12/04/2013 0953   CALCIUM 8.3 (L) 10/31/2022 0350   CALCIUM 8.7 11/13/2011 1110   GFRNONAA 41 (L) 10/31/2022 0350   GFRNONAA 49 (L) 12/04/2013 0953   GFRAA 54 (L) 08/14/2017 0912   GFRAA 57 (L) 12/04/2013 0953    INR    Component Value Date/Time   INR 1.2 10/26/2022 1042     Intake/Output Summary (Last 24 hours) at 10/31/2022 1440 Last data filed at 10/31/2022 0600 Gross per 24 hour  Intake 240 ml  Output --  Net 240 ml     Assessment/Plan:  82 y.o. female  is s/p Angioplasty of the left superficial femoral and popliteal arteries, angioplasty and stent of left posterior tibial, mechanical thrombectomy of the entire SFA and popliteal arteries.   5 Days Post-Op   PLAN: Access site is without complications.  Left Lower extremity healing slowly. No other revascularization options per Dr Delana Meyer.  Positive Strong PT doppler signal today.  Wound Care: Treat as as Burn, Silvadene cream BID to foot with light guaze dressing. Do not use adhesive dressing as it will tear skin.  Antibiotics per Primary Team Patient will need to go home on ASA '81mg'$  QD, Plavix '75mg'$  QD and Eliquis 2.5 mg BID  DVT prophylaxis:  Eliquis, ASA and Plavix    Drema Pry [facility] 10/31/2022 2:40 PM

## 2022-10-31 NOTE — Progress Notes (Signed)
Nutrition Follow-up  DOCUMENTATION CODES:   Not applicable  INTERVENTION:   -Continue with regular diet -Continue MVI with minerals daily -Continue 500 mg vitamin C BID -Continue 220 mg zinc sulfate daily x 14 days -Continue Magic cup TID with meals, each supplement provides 290 kcal and 9 grams of protein  -Continue 30 ml Prosource Plus TID, each supplement provides 100 kcals and 15 grams protein  NUTRITION DIAGNOSIS:   Increased nutrient needs related to wound healing as evidenced by estimated needs.  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  1/19- s/p s/p L PTA PTA+S, Mech TE SFA/pop    Reviewed I/O's: +15 ml x 24 hours and +5.8 L since admission  UOP: 225 ml x 24 hours  Vascular surgery recommending no further interventions at this time.   Pt working with mobility team at time of visit. Pt remains sleepy and will not open eyes per chart review.   Pt remains with poor oral intake. Noted meal completion 0-25%. Pt with variable acceptance of supplements.   Palliative care consulted for goals of care discussions. Given pt's advanced age and dementia, would not recommend artificial means of nutrition and hydration, as this would not enhance pt's quality of life.    Medications reviewed and include vitamin C and zinc sulfate.   Labs reviewed: Na: 133, CBGS: 207-227 (inpatient orders for glycemic control are 10 units inuslin aspart protamine-aspart BID).    Diet Order:   Diet Order              Diet regular Fluid consistency: Thin  Diet effective now                   EDUCATION NEEDS:   Education needs have been addressed  Skin:  Skin Assessment: Reviewed RN Assessment  Last BM:  10/26/22  Height:   Ht Readings from Last 1 Encounters:  10/26/22 '5\' 2"'$  (1.575 m)    Weight:   Wt Readings from Last 1 Encounters:  10/26/22 70.2 kg    Ideal Body Weight:  50 kg  BMI:  Body mass index is 28.31 kg/m.  Estimated Nutritional Needs:   Kcal:  1550-1750  Protein:  85-100 grams  Fluid:  > 1.5 L    Loistine Chance, RD, LDN, [place] Registered Dietitian II Certified Diabetes Care and Education Specialist Please refer to [facility] for RD and/or RD on-call/weekend/after hours pager

## 2022-11-01 DIAGNOSIS — I70222 Atherosclerosis of native arteries of extremities with rest pain, left leg: Secondary | ICD-10-CM | POA: Diagnosis not present

## 2022-11-01 LAB — BASIC METABOLIC PANEL
Anion gap: 3 — ABNORMAL LOW (ref 5–15)
BUN: 31 mg/dL — ABNORMAL HIGH (ref 8–23)
CO2: 20 mmol/L — ABNORMAL LOW (ref 22–32)
Calcium: 8.6 mg/dL — ABNORMAL LOW (ref 8.9–10.3)
Chloride: 115 mmol/L — ABNORMAL HIGH (ref 98–111)
Creatinine, Ser: 1.1 mg/dL — ABNORMAL HIGH (ref 0.44–1.00)
GFR, Estimated: 50 mL/min — ABNORMAL LOW (ref >60)
Glucose, Bld: 137 mg/dL — ABNORMAL HIGH (ref 70–99)
Potassium: 3.6 mmol/L (ref 3.5–5.1)
Sodium: 138 mmol/L (ref 135–145)

## 2022-11-01 LAB — CBC
HCT: 25.6 % — ABNORMAL LOW (ref 36.0–46.0)
Hemoglobin: 8.4 g/dL — ABNORMAL LOW (ref 12.0–15.0)
MCH: 28.6 pg (ref 26.0–34.0)
MCHC: 32.8 g/dL (ref 30.0–36.0)
MCV: 87.1 fL (ref 80.0–100.0)
Platelets: 412 10*3/uL — ABNORMAL HIGH (ref 150–400)
RBC: 2.94 MIL/uL — ABNORMAL LOW (ref 3.87–5.11)
RDW: 14 % (ref 11.5–15.5)
WBC: 14.2 10*3/uL — ABNORMAL HIGH (ref 4.0–10.5)
nRBC: 0 % (ref 0.0–0.2)

## 2022-11-01 LAB — GLUCOSE, CAPILLARY
Glucose-Capillary: 116 mg/dL — ABNORMAL HIGH (ref 70–99)
Glucose-Capillary: 138 mg/dL — ABNORMAL HIGH (ref 70–99)
Glucose-Capillary: 165 mg/dL — ABNORMAL HIGH (ref 70–99)
Glucose-Capillary: 191 mg/dL — ABNORMAL HIGH (ref 70–99)
Glucose-Capillary: 87 mg/dL (ref 70–99)
Glucose-Capillary: 88 mg/dL (ref 70–99)

## 2022-11-01 LAB — HEPARIN LEVEL (UNFRACTIONATED): Heparin Unfractionated: >1.1 IU/mL — ABNORMAL HIGH (ref 0.30–0.70)

## 2022-11-01 LAB — MAGNESIUM: Magnesium: 1.8 mg/dL (ref 1.7–2.4)

## 2022-11-01 NOTE — Progress Notes (Signed)
[place] ([facility])    At this time, plans remains for [facility] to initiate hospice services at discharge. Per MD, Dr. Arbutus Ped, goal is for discharge date of 1.26.   Once medically clear, if applicable, please send signed and completed DNR with patient/family upon discharge. Please provide prescriptions at discharge as needed to ensure ongoing symptom management and a transport packet.   [facility] information and contact numbers given to family and above information shared with TOC.    Please call with any questions/concerns.    Thank you for the opportunity to participate in this patient's care   Phillis Haggis, MSW [facility] Liaison  [phone]

## 2022-11-01 NOTE — Progress Notes (Signed)
Progress Note   Patient: Beth Mcguire QBH:[id number] DOB: [birth year] DOA: 10/26/2022     6 DOS: the patient was seen and examined on 11/01/2022   Brief hospital course: 82 y.o. 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 Patient was seen in the ER on 10/19/22 for evaluation of left leg pain and had an ultrasound which showed a left tibial vein DVT.  She was discharged home and advised to follow-up with vascular surgery who continued her dual antiplatelet therapy and did not place patient on anticoagulation due to her age and increased risk of falls. Daughter notes that she complained of worsening back pain on the day of admission and she took off her sock and noted that her left foot had purplish blue discoloration extending from the left foot to the ankle and so she called EMS and patient was brought into the ER for further evaluation. She rates the pain in her leg a 10 x 10 in intensity at its worst. She denies having any chest pain, no shortness of breath, no nausea, no vomiting, no dizziness, no lightheadedness, no headache, no urinary symptoms, no blurred vision no focal deficit Abnormal labs include glucose of 262, white count 14.0, platelet count 403, serum creatinine 1.53 above a baseline of 1.15  Dr. Delana Meyer took to the vascular lab on 10/26/2022.  Angioplasty of the left superficial femoral and popliteal arteries, angioplasty and stent of left posterior tibial, mechanical thrombectomy of the entire SFA and popliteal using Rota Rex device.  1/20.  Left foot demarcated from the low sock line down.  Continue heparin drip.  Chest x-ray showing pneumonia started  Levaquin. 1/21.  Had fever last night.  Will get blood cultures today.  Add vancomycin. 1/22.  Patient complaining of right-sided weakness.  Stat CT scan of the head was negative.  MRI of the brain was negative for acute stroke. 1/23.  Case discussed with vascular team.  They are not going to do any further interventions at this time and recommended continued care consult for her foot.  They are okay to switch over to Eliquis for tomorrow and recommended triple blood thinners.  Assessment and Plan: * Critical limb ischemia of left lower extremity ([place]) Status post procedure 1/19 by Dr. Delana Meyer to try to obtain the best circulation to her left foot.  Left foot demarcated from the low sock line down.  Dr. Leslye Peer discussed with vascular team and they stated that the foot actually looks better than what it did when she came in.  No further intervention planned.   Transitioned from IV heparin to Eliquis. Also needs aspirin and Plavix.  Acute right-sided weakness Stat CT scan of the head negative.  MRI negative for stroke.  Appreciate neurology consultation.  Lobar pneumonia ([place]) Changed Levaquin to Rocephin (abx started on 1/20), given profound side effects in elderly dementia patients.  She is tolerating Rocephin with no sign of allergic reaction.  Discontinued vancomycin previously since vascular team thinks the left foot is looking better and not a source of infection.  Acute hypoxic respiratory failure ([place]) Patient had a pulse ox of 89% on 4 L of oxygen.  Weaned off O2, stable sats on room air  Acute kidney injury superimposed  on CKD ([place]) AKI on CKD stage III A.  Baseline creatinine around 1.15.   Cr on admission was 1.53.   Creatinine 1.77 >> 1.45 >> 1.30 >> 1.10.   Stop IV fluids.  PAD (peripheral artery disease) (HCC) Continue Eliquis, aspirin and Plavix per vascular surgery.   Type 2 diabetes mellitus with chronic kidney disease, with long-term current use of insulin  (HCC) Decreased 70/30 insulin 10 units twice a day.  Continue 0-6 units sliding scale Novolog.  Hold Glucophage.  Last hemoglobin A1c 7.4.  Hypertension Hold antihypertensive medications  Decubitus ulcer of sacral region, stage 1 Present on admission, see full description below.  Tachycardia Continue gentle fluids and as needed metoprolol IV for tachycardia.  This has improved.  Anemia, unspecified Hemoglobin down to 8.6.  Ferritin normal 135.  Vascular surgery recommending triple blood thinners which would make her higher risk for bleeding. Hbg has been stable. No active bleeding        Subjective: Patient seen with NT in room to check BP.  NT reports patient gets agitated when attempting to take vitals and check blood sugars this morning.  Pt asleep sitting up, hunched over to the right.  She wakes up easily but falls back to sleep quickly.  Did not eat much if any breakfast. NT reports attempting to assist with feeding her breakfast but pt stated she wasn't hungry.    Physical Exam: Vitals:   11/01/22 0347 11/01/22 0747 11/01/22 0750 11/01/22 1147  BP: (!) 117/29 (!) 90/46 (!) 156/66 (!) 94/46  Pulse: 64 (!) 114  69  Resp:  16  16  Temp: 97.6 F (36.4 C) 98.5 F (36.9 C)  98.2 F (36.8 C)  TempSrc: Oral     SpO2: 99% 95%  100%  Weight:      Height:       General exam: sleeping sitting up in bed, arouses easily to voice but does not stay awake long, confused HEENT: moist mucus membranes, hearing grossly normal  Respiratory system: CTAB with diminished bases, no wheezes, normal respiratory effort. Poor inspiratory effort. Cardiovascular system: normal S1/S2, RRR, LLE pitting edema, trace on RLE Gastrointestinal system: soft, NT, ND Central nervous system: no gross focal neurologic deficits, normal speech Extremities: left foot and lwoer extremity edema, blister on superior aspect of left foot, left foot persistent erythema but improved overall Skin: left foot darkening  and erythema that appears to be receding Psychiatry: normal mood, congruent affect, abnormal judgement and insight due to dementia     Data Reviewed:   Notable labs: Cl 115, bicarb 20 from 17 improved, Cr 1.10 from 1.30 improved. Glucose 137.  WBC stable 14.2k, Hbg 8.4 from 8.2 stable.  Platelets 412k  likely reactive   Family Communication: Updated patient's daughter at bedside on rounds 1/24.  Not present today, will attempt to call.   Disposition: Status is: Inpatient Remains inpatient appropriate because: Pending DME delivery to home.  Likely home with hospice tomorrow.    Planned Discharge Destination: Home with hospice    Time spent: 40 minutes   Author: Ezekiel Slocumb, DO 11/01/2022 4:47 PM  For on call review [email or web address].

## 2022-11-02 DIAGNOSIS — Z515 Encounter for palliative care: Secondary | ICD-10-CM

## 2022-11-02 DIAGNOSIS — I70222 Atherosclerosis of native arteries of extremities with rest pain, left leg: Secondary | ICD-10-CM | POA: Diagnosis not present

## 2022-11-02 LAB — CULTURE, BLOOD (ROUTINE X 2)
Culture: NO GROWTH
Culture: NO GROWTH
Special Requests: ADEQUATE
Special Requests: ADEQUATE

## 2022-11-02 LAB — BASIC METABOLIC PANEL
Anion gap: 6 (ref 5–15)
BUN: 22 mg/dL (ref 8–23)
CO2: 19 mmol/L — ABNORMAL LOW (ref 22–32)
Calcium: 8.6 mg/dL — ABNORMAL LOW (ref 8.9–10.3)
Chloride: 115 mmol/L — ABNORMAL HIGH (ref 98–111)
Creatinine, Ser: 0.98 mg/dL (ref 0.44–1.00)
GFR, Estimated: 57 mL/min — ABNORMAL LOW (ref >60)
Glucose, Bld: 98 mg/dL (ref 70–99)
Potassium: 3.4 mmol/L — ABNORMAL LOW (ref 3.5–5.1)
Sodium: 140 mmol/L (ref 135–145)

## 2022-11-02 LAB — CBC
HCT: 26.3 % — ABNORMAL LOW (ref 36.0–46.0)
Hemoglobin: 8.6 g/dL — ABNORMAL LOW (ref 12.0–15.0)
MCH: 28.5 pg (ref 26.0–34.0)
MCHC: 32.7 g/dL (ref 30.0–36.0)
MCV: 87.1 fL (ref 80.0–100.0)
Platelets: 489 10*3/uL — ABNORMAL HIGH (ref 150–400)
RBC: 3.02 MIL/uL — ABNORMAL LOW (ref 3.87–5.11)
RDW: 14.2 % (ref 11.5–15.5)
WBC: 16.2 10*3/uL — ABNORMAL HIGH (ref 4.0–10.5)
nRBC: 0 % (ref 0.0–0.2)

## 2022-11-02 LAB — GLUCOSE, CAPILLARY
Glucose-Capillary: 97 mg/dL (ref 70–99)
Glucose-Capillary: 119 mg/dL — ABNORMAL HIGH (ref 70–99)
Glucose-Capillary: 133 mg/dL — ABNORMAL HIGH (ref 70–99)
Glucose-Capillary: 163 mg/dL — ABNORMAL HIGH (ref 70–99)

## 2022-11-02 MED ORDER — ZINC SULFATE 220 (50 ZN) MG PO CAPS: 220.0000 mg | ORAL_CAPSULE | Freq: Every day | ORAL | 2 refills | Status: DC

## 2022-11-02 MED ORDER — MORPHINE SULFATE (CONCENTRATE) 20 MG/ML PO SOLN: 10.0000 mg | ORAL | 0 refills | Status: DC | PRN

## 2022-11-02 MED ORDER — ADULT MULTIVITAMIN W/MINERALS CH: 1.0000 | ORAL_TABLET | Freq: Every day | ORAL | Status: DC

## 2022-11-02 MED ORDER — APIXABAN 2.5 MG PO TABS: 2.5000 mg | ORAL_TABLET | Freq: Two times a day (BID) | ORAL | 2 refills | Status: DC

## 2022-11-02 MED ORDER — HALOPERIDOL 5 MG PO TABS: 5.0000 mg | ORAL_TABLET | ORAL | 0 refills | Status: DC | PRN

## 2022-11-02 MED ORDER — ASCORBIC ACID 500 MG PO TABS: 500.0000 mg | ORAL_TABLET | Freq: Two times a day (BID) | ORAL | Status: DC

## 2022-11-02 MED ORDER — PROSOURCE PLUS PO LIQD: 30.0000 mL | Freq: Three times a day (TID) | ORAL | 1 refills | Status: DC

## 2022-11-02 MED ORDER — POTASSIUM CHLORIDE CRYS ER 20 MEQ PO TBCR
40.0000 meq | EXTENDED_RELEASE_TABLET | Freq: Once | ORAL | Status: AC
  Administered 2022-11-02: 40 meq via ORAL
  Filled 2022-11-02: qty 2

## 2022-11-02 MED ORDER — OXYCODONE HCL 5 MG PO TABS: 5.0000 mg | ORAL_TABLET | ORAL | 0 refills | Status: DC | PRN

## 2022-11-02 MED ORDER — LORAZEPAM 0.5 MG PO TABS: 0.5000 mg | ORAL_TABLET | ORAL | 0 refills | Status: DC | PRN

## 2022-11-02 MED ORDER — PROCHLORPERAZINE 25 MG RE SUPP: 25.0000 mg | RECTAL | 0 refills | Status: DC | PRN

## 2022-11-02 NOTE — Progress Notes (Signed)
[facility] ([facility])   PCG/Bonnie confirmed with MSW that all DME has arrived and that she is prepared for patients arrival back home. TOC/[place] notified of this & to proceed with [facility] transportation scheduling.    If applicable, please send signed and completed DNR with patient/family upon discharge. Please provide prescriptions at discharge as needed to ensure ongoing symptom management and a transport packet.   [facility] information and contact numbers given to family and above information shared with TOC.    Please call with any questions/concerns.    Thank you for the opportunity to participate in this patient's care   Phillis Haggis, MSW [facility] Liaison  [phone]

## 2022-11-02 NOTE — TOC Progression Note (Addendum)
Transition of Care [facility]) - Progression Note    Patient Details  Name: Beth Mcguire MRN: [id number] Date of Birth: 25-Dec-1940  [facility] CM/SW Contact  Laurena Slimmer, RN Phone Number: 11/02/2022, 10:27 AM  Clinical Narrative:    Patient discharging home with [facility].  Patient being transported by [facility]. Face sheet and medical necessity forms printed to the floor to be added to the discharge packet.  EMS arranged.  Spoke with patient's daughter to confirm she is at home waiting on patient.   TOC signing off .           Expected Discharge Plan and Services                                               Social Determinants of Health (SDOH) Interventions SDOH Screenings   Food Insecurity: No Food Insecurity (10/26/2022)  Housing: Low Risk  (10/26/2022)  Transportation Needs: No Transportation Needs (10/26/2022)  Utilities: Not At Risk (10/26/2022)  Tobacco Use: Low Risk  (10/29/2022)    Readmission Risk Interventions     No data to display

## 2022-11-02 NOTE — Discharge Summary (Signed)
Physician Discharge Summary   Patient: Beth Mcguire MRN: [id number] DOB: [birth year]  Admit date:     10/26/2022  Discharge date: 11/02/22  Discharge Physician: Ezekiel Slocumb   PCP: [name], MD   Recommendations at discharge:   Follow up with [facility] for ongoing home hospice support Follow up with vascular surgery as scheduled, if in line with goals of care Follow up with primary care   Discharge Diagnoses: Principal Problem:   Critical limb ischemia of left lower extremity ([place]) Active Problems:   Acute right-sided weakness   Lobar pneumonia ([place])   Acute hypoxic respiratory failure (H[facility])   PAD (peripheral artery disease) ([place])   Acute kidney injury superimposed on CKD ([place])   Type 2 diabetes mellitus with chronic kidney disease, with long-term current use of insulin ([place])   Hypertension   Anemia, unspecified   Tachycardia   Decubitus ulcer of sacral region, stage 1   Hospice care patient  Resolved Problems:   * No resolved hospital problems. *  Hospital Course: 82 y.o. 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 Patient was seen in the ER on 10/19/22 for evaluation of left leg pain and had an ultrasound which showed a left tibial vein DVT.  She was discharged home and advised to follow-up with vascular surgery who continued her dual antiplatelet therapy and did not place patient on anticoagulation due to her age and increased risk of falls. Daughter notes that she complained of worsening back pain on the day of admission and she took off her sock and noted that her left foot had purplish blue discoloration extending from the left foot to  the ankle and so she called EMS and patient was brought into the ER for further evaluation. She rates the pain in her leg a 10 x 10 in intensity at its worst.... Abnormal labs include glucose of 262, white count 14.0, platelet count 403, serum creatinine 1.53 above a baseline of 1.15  Dr. Delana Meyer took to the vascular lab on 10/26/2022.  Angioplasty of the left superficial femoral and popliteal arteries, angioplasty and stent of left posterior tibial, mechanical thrombectomy of the entire SFA and popliteal using Rota Rex device.  1/20.  Left foot demarcated from the low sock line down.  Continue heparin drip.  Chest x-ray showing pneumonia started Levaquin. 1/21.  Had fever last night.  Will get blood cultures today.  Add vancomycin. 1/22.  Patient complaining of right-sided weakness.  Stat CT scan of the head was negative.  MRI of the brain was negative for acute stroke. 1/23.  Case discussed with vascular team.  They are not going to do any further interventions at this time and recommended continued care consult for her foot.  They are okay to switch over to Eliquis for tomorrow and recommended triple blood thinners.  1/24 >> 1/26 - met with daughter at bedside 1/24, she indicated wish to take patient home with hospice.  Hospice liaison was informed and patient was accepted to their service.  She remains stable on Eliquis in addition to DAPT.    Patient is stable for discharge home with hospice today.  Comfort medications sent to her pharmacy as requested.  Confirmed DME including hospital bed  were delivered.  Assessment and Plan: * Critical limb ischemia of left lower extremity ([place]) Status post procedure 1/19 by Dr. Delana Meyer to try to obtain the best circulation to her left foot.  Left foot demarcated from the low sock line down.  Dr. Leslye Peer discussed with vascular team and they stated that the foot actually looks better than what it did when she came in.  No further intervention planned.    Transitioned from IV heparin to Eliquis. Also needs aspirin and Plavix.  Acute right-sided weakness Stat CT scan of the head negative.  MRI negative for stroke.  Appreciate neurology consultation.  Lobar pneumonia ([place]) Changed Levaquin to Rocephin (abx started on 1/20), given profound side effects in elderly dementia patients.  She is tolerating Rocephin with no sign of allergic reaction.  Discontinued vancomycin previously since vascular team thinks the left foot is looking better and not a source of infection.  Acute hypoxic respiratory failure ([place]) Patient had a pulse ox of 89% on 4 L of oxygen.  Weaned off O2, stable sats on room air  Acute kidney injury superimposed on CKD ([place]) AKI on CKD stage III A.  Baseline creatinine around 1.15.   Cr on admission was 1.53.   Creatinine 1.77 >> 1.45 >> 1.30 >> 1.10.   Stop IV fluids.  PAD (peripheral artery disease) (HCC) Continue Eliquis, aspirin and Plavix per vascular surgery.   Type 2 diabetes mellitus with chronic kidney disease, with long-term current use of insulin (HCC) Decreased 70/30 insulin 10 units twice a day.  Continue 0-6 units sliding scale Novolog.  Hold Glucophage.  Last hemoglobin A1c 7.4.  Hypertension Hold antihypertensive medications  Decubitus ulcer of sacral region, stage 1 Present on admission, see full description below.  Tachycardia Continue gentle fluids and as needed metoprolol IV for tachycardia.  This has improved.  Anemia, unspecified Hemoglobin down to 8.6.  Ferritin normal 135.  Vascular surgery recommending triple blood thinners which would make her higher risk for bleeding. Hbg has been stable. No active bleeding         Consultants: Vascular surgery Procedures performed: Angioplasty of the left superficial femoral and popliteal arteries, angioplasty and stent of left posterior tibial, mechanical thrombectomy of the entire SFA and popliteal arteries.     Disposition: Hospice care Diet  recommendation:  Discharge Diet Orders (From admission, onward)     Start     Ordered   11/02/22 0000  Diet - low sodium heart healthy        11/02/22 1032            DISCHARGE MEDICATION: Allergies as of 11/02/2022       Reactions   Amoxicillin-pot Clavulanate Rash   Zosyn [piperacillin Sod-tazobactam So] Rash        Medication List     STOP taking these medications    HYDROcodone-acetaminophen 5-325 MG tablet Commonly known as: NORCO/VICODIN   lisinopril 10 MG tablet Commonly known as: ZESTRIL   traMADol 50 MG tablet Commonly known as: ULTRAM       TAKE these medications    (feeding supplement) PROSource Plus liquid Take 30 mLs by mouth 3 (three) times daily between meals.   apixaban 2.5 MG Tabs tablet Commonly known as: ELIQUIS Take 1 tablet (2.5 mg total) by mouth 2 (two) times daily.   ascorbic acid 500 MG tablet Commonly known as: VITAMIN C Take 1 tablet (500 mg total) by mouth 2 (two) times daily.   aspirin 81 MG tablet Take 1  tablet (81 mg total) by mouth daily.   atorvastatin 10 MG tablet Commonly known as: LIPITOR Take 10 mg every evening by mouth.   clopidogrel 75 MG tablet Commonly known as: PLAVIX TAKE 1 TABLET BY MOUTH EVERY DAY   divalproex 250 MG 24 hr tablet Commonly known as: DEPAKOTE ER Take 250 mg by mouth daily.   haloperidol 5 MG tablet Commonly known as: HALDOL Take 1 tablet (5 mg total) by mouth every 4 (four) hours as needed for agitation. May crush, mix with water and give sublingually if needed.   loratadine 10 MG dissolvable tablet Commonly known as: CLARITIN REDITABS Take 10 mg by mouth daily as needed for allergies.   LORazepam 0.5 MG tablet Commonly known as: Ativan Take 1-2 tablets (0.5-1 mg total) by mouth every 4 (four) hours as needed for anxiety.   metFORMIN 500 MG 24 hr tablet Commonly known as: GLUCOPHAGE-XR Take 500 mg by mouth daily with supper.   morphine 20 MG/ML concentrated  solution Commonly known as: ROXANOL Take 0.5 mLs (10 mg total) by mouth every 4 (four) hours as needed for severe pain or shortness of breath. May give sublingually if needed.   multivitamin with minerals Tabs tablet Take 1 tablet by mouth daily. Start taking on: November 03, 2022   NovoLOG Mix 70/30 FlexPen (70-30) 100 UNIT/ML FlexPen Generic drug: insulin aspart protamine - aspart Inject 22-28 Units into the skin See admin instructions. Inject 28u under the skin ever morning after breakfast and inject 22u under the skin every evening after supper   oxyCODONE 5 MG immediate release tablet Commonly known as: Oxy IR/ROXICODONE Take 1 tablet (5 mg total) by mouth every 4 (four) hours as needed for moderate pain.   prochlorperazine 25 MG suppository Commonly known as: COMPAZINE Place 1 suppository (25 mg total) rectally every 4 (four) hours as needed for nausea or vomiting.   QUEtiapine 50 MG tablet Commonly known as: SEROQUEL Take 50 mg by mouth every evening.   zinc sulfate 220 (50 Zn) MG capsule Take 1 capsule (220 mg total) by mouth daily. Start taking on: November 03, 2022               Durable Medical Equipment  (From admission, onward)           Start     Ordered   11/02/22 0857  For home use only DME Hospital bed  Once       Question Answer Comment  Length of Need Lifetime   The above medical condition requires: Patient requires the ability to reposition immediately   Bed type Semi-electric      11/02/22 0856              Discharge Care Instructions  (From admission, onward)           Start     Ordered   11/02/22 0000  Discharge wound care:       Comments: Apply silvadene cream BID to all areas of left foot include heel, and cover with light guaze dressing.  Do not apply any adhesive as it will tear patients skin.   11/02/22 1032            Discharge Exam: Filed Weights   10/26/22 2122  Weight: 70.2 kg   General exam: awake,  appears drowsy, no acute distress HEENT: atraumatic, clear conjunctiva, anicteric sclera, moist mucus membranes, hearing grossly normal  Respiratory system: CTAB with diminished bases, no wheezes, rales or rhonchi, normal respiratory  effort. Cardiovascular system: normal S1/S2,  RRR, no JVD, murmurs, rubs, gallops, R>L LE edema.   Gastrointestinal system: soft, NT, ND, no HSM felt, +bowel sounds. Central nervous system: A&O x self. no gross focal neurologic deficits, normal speech Extremities: right foot discoloration stable, RLE edema persistent, right foot dressing in place, no edema, normal tone Skin: dry, intact, normal temperature Psychiatry: normal mood, congruent affect, judgement and insight appear normal   Condition at discharge: stable  The results of significant diagnostics from this hospitalization (including imaging, microbiology, ancillary and laboratory) are listed below for reference.   Imaging Studies: MR BRAIN WO CONTRAST  Result Date: 10/29/2022 CLINICAL DATA:  Stroke suspected, confusion, right-sided weakness EXAM: MRI HEAD WITHOUT CONTRAST MRA HEAD WITHOUT CONTRAST MRA NECK WITHOUT CONTRAST TECHNIQUE: Multiplanar, multi-echo pulse sequences of the brain and surrounding structures were acquired without intravenous contrast. Angiographic images of the Circle of Willis were acquired using MRA technique without intravenous contrast. Angiographic images of the neck were acquired using MRA technique withoutintravenous contrast. Carotid stenosis measurements (when applicable) are obtained utilizing NASCET criteria, using the distal internal carotid diameter as the denominator. COMPARISON:  No prior MRI available, correlation is made with CT head 10/29/2022 FINDINGS: MRI HEAD FINDINGS Brain: No restricted diffusion to suggest acute or subacute infarct. No acute hemorrhage, mass, mass effect, or midline shift. No hydrocephalus or extra-axial collection. No hemosiderin deposition to  suggest remote hemorrhage. Normal pituitary and craniocervical junction. Advanced cerebral volume loss for age, left greater than right. Remote lacunar infarct in the right basal ganglia. T2 hyperintense signal in the periventricular white matter and pons, likely the sequela of moderate chronic small vessel ischemic disease. Vascular: Please see MRA findings below. Skull and upper cervical spine: Normal marrow signal. Sinuses/Orbits: Minimal mucosal thickening in the maxillary sinuses and ethmoid air cells. Status post bilateral lens replacements. Other: None. MRA HEAD FINDINGS Evaluation is somewhat limited by motion artifact. Anterior circulation: Both internal carotid arteries are patent to the termini, with moderate to severe stenosis in the proximal right cavernous ICA and mild stenosis in the proximal right supraclinoid ICA A1 segments patent. Normal anterior communicating artery. Poor visualization of the A2 segments is favored to be motion related. Anterior cerebral arteries are otherwise patent to their distal aspects. No M1 stenosis or occlusion. Multifocal irregularity, with poor visualization of the right greater than left M2 and M3 branching, possibly secondary to motion. Distal MCA branches perfused and symmetric. Posterior circulation: The left V4 is not visualized. The right V4 is patent to the vertebrobasilar junction. The right PICA is patent. The left PICA is not visualized. Basilar patent to its distal aspect. Bilateral AICAs patent proximally. Superior cerebellar arteries patent bilaterally. Patent left P1 segment. Fetal origin of the right PCA from the right posterior communicating artery, with mild stenosis proximally (series 1, image 112). Additional mild stenosis in the bilateral P2 segments (series 1, image 116 and 117), both proximally and distally. The left posterior communicating artery may also be patent. Anatomic variants: Fetal origin of the right PCA. MRA NECK FINDINGS Aortic arch:  Two-vessel arch with a common origin of the brachiocephalic and left common carotid arteries. Imaged portion shows no evidence of aneurysm or dissection. No significant stenosis of the major arch vessel origins. Right carotid system: No evidence of dissection, occlusion, or hemodynamically significant stenosis (greater than 50%). Left carotid system: No evidence of dissection, occlusion, or hemodynamically significant stenosis (greater than 50%). Vertebral arteries: The right vertebral artery is patent, without evidence of significant  stenosis, dissection, or occlusion. Poor signal in the left vertebral artery from its origin to the skull base, likely indicating significant stenosis. Other: None IMPRESSION: 1. No acute intracranial process. No evidence of acute or subacute infarct. 2. 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 This could be further evaluated with a CTA head and neck clinically indicated. 3. Evaluation of the intracranial vasculature is limited by motion. Within this limitation, there is moderate to severe stenosis in the proximal right cavernous ICA, and mild stenosis in the proximal right supraclinoid ICA, right posterior communicating artery, and bilateral P2 segments. 4. Multifocal irregularity in the intracranial vasculature, with poor visualization of the bilateral A2 segments and M2 and M3 branching, possibly secondary to motion. This could also be evaluated with CTA if indicated. 5. No other hemodynamically significant stenosis in the neck. Electronically Signed   By: Merilyn Baba M.D.   On: 10/29/2022 13:37   MR ANGIO HEAD WO CONTRAST  Result Date: 10/29/2022 CLINICAL DATA:  Stroke suspected, confusion, right-sided weakness EXAM: MRI HEAD WITHOUT CONTRAST MRA HEAD WITHOUT CONTRAST MRA NECK WITHOUT CONTRAST TECHNIQUE: Multiplanar, multi-echo pulse  sequences of the brain and surrounding structures were acquired without intravenous contrast. Angiographic images of the Circle of Willis were acquired using MRA technique without intravenous contrast. Angiographic images of the neck were acquired using MRA technique withoutintravenous contrast. Carotid stenosis measurements (when applicable) are obtained utilizing NASCET criteria, using the distal internal carotid diameter as the denominator. COMPARISON:  No prior MRI available, correlation is made with CT head 10/29/2022 FINDINGS: MRI HEAD FINDINGS Brain: No restricted diffusion to suggest acute or subacute infarct. No acute hemorrhage, mass, mass effect, or midline shift. No hydrocephalus or extra-axial collection. No hemosiderin deposition to suggest remote hemorrhage. Normal pituitary and craniocervical junction. Advanced cerebral volume loss for age, left greater than right. Remote lacunar infarct in the right basal ganglia. T2 hyperintense signal in the periventricular white matter and pons, likely the sequela of moderate chronic small vessel ischemic disease. Vascular: Please see MRA findings below. Skull and upper cervical spine: Normal marrow signal. Sinuses/Orbits: Minimal mucosal thickening in the maxillary sinuses and ethmoid air cells. Status post bilateral lens replacements. Other: None. MRA HEAD FINDINGS Evaluation is somewhat limited by motion artifact. Anterior circulation: Both internal carotid arteries are patent to the termini, with moderate to severe stenosis in the proximal right cavernous ICA and mild stenosis in the proximal right supraclinoid ICA A1 segments patent. Normal anterior communicating artery. Poor visualization of the A2 segments is favored to be motion related. Anterior cerebral arteries are otherwise patent to their distal aspects. No M1 stenosis or occlusion. Multifocal irregularity, with poor visualization of the right greater than left M2 and M3 branching, possibly secondary  to motion. Distal MCA branches perfused and symmetric. Posterior circulation: The left V4 is not visualized. The right V4 is patent to the vertebrobasilar junction. The right PICA is patent. The left PICA is not visualized. Basilar patent to its distal aspect. Bilateral AICAs patent proximally. Superior cerebellar arteries patent bilaterally. Patent left P1 segment. Fetal origin of the right PCA from the right posterior communicating artery, with mild stenosis proximally (series 1, image 112). Additional mild stenosis in the bilateral P2 segments (series 1, image 116 and 117), both proximally and distally. The left posterior communicating artery may also be patent. Anatomic variants: Fetal origin of the  right PCA. MRA NECK FINDINGS Aortic arch: Two-vessel arch with a common origin of the brachiocephalic and left common carotid arteries. Imaged portion shows no evidence of aneurysm or dissection. No significant stenosis of the major arch vessel origins. Right carotid system: No evidence of dissection, occlusion, or hemodynamically significant stenosis (greater than 50%). Left carotid system: No evidence of dissection, occlusion, or hemodynamically significant stenosis (greater than 50%). Vertebral arteries: The right vertebral artery is patent, without evidence of significant stenosis, dissection, or occlusion. Poor signal in the left vertebral artery from its origin to the skull base, likely indicating significant stenosis. Other: None IMPRESSION: 1. No acute intracranial process. No evidence of acute or subacute infarct. 2. 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 This could be further evaluated with a CTA head and neck clinically indicated. 3. Evaluation of the intracranial vasculature is limited by motion. Within this limitation, there is moderate to severe stenosis in the  proximal right cavernous ICA, and mild stenosis in the proximal right supraclinoid ICA, right posterior communicating artery, and bilateral P2 segments. 4. Multifocal irregularity in the intracranial vasculature, with poor visualization of the bilateral A2 segments and M2 and M3 branching, possibly secondary to motion. This could also be evaluated with CTA if indicated. 5. No other hemodynamically significant stenosis in the neck. Electronically Signed   By: Merilyn Baba M.D.   On: 10/29/2022 13:37   MR ANGIO NECK WO CONTRAST  Result Date: 10/29/2022 CLINICAL DATA:  Stroke suspected, confusion, right-sided weakness EXAM: MRI HEAD WITHOUT CONTRAST MRA HEAD WITHOUT CONTRAST MRA NECK WITHOUT CONTRAST TECHNIQUE: Multiplanar, multi-echo pulse sequences of the brain and surrounding structures were acquired without intravenous contrast. Angiographic images of the Circle of Willis were acquired using MRA technique without intravenous contrast. Angiographic images of the neck were acquired using MRA technique withoutintravenous contrast. Carotid stenosis measurements (when applicable) are obtained utilizing NASCET criteria, using the distal internal carotid diameter as the denominator. COMPARISON:  No prior MRI available, correlation is made with CT head 10/29/2022 FINDINGS: MRI HEAD FINDINGS Brain: No restricted diffusion to suggest acute or subacute infarct. No acute hemorrhage, mass, mass effect, or midline shift. No hydrocephalus or extra-axial collection. No hemosiderin deposition to suggest remote hemorrhage. Normal pituitary and craniocervical junction. Advanced cerebral volume loss for age, left greater than right. Remote lacunar infarct in the right basal ganglia. T2 hyperintense signal in the periventricular white matter and pons, likely the sequela of moderate chronic small vessel ischemic disease. Vascular: Please see MRA findings below. Skull and upper cervical spine: Normal marrow signal. Sinuses/Orbits:  Minimal mucosal thickening in the maxillary sinuses and ethmoid air cells. Status post bilateral lens replacements. Other: None. MRA HEAD FINDINGS Evaluation is somewhat limited by motion artifact. Anterior circulation: Both internal carotid arteries are patent to the termini, with moderate to severe stenosis in the proximal right cavernous ICA and mild stenosis in the proximal right supraclinoid ICA A1 segments patent. Normal anterior communicating artery. Poor visualization of the A2 segments is favored to be motion related. Anterior cerebral arteries are otherwise patent to their distal aspects. No M1 stenosis or occlusion. Multifocal irregularity, with poor visualization of the right greater than left M2 and M3 branching, possibly secondary to motion. Distal MCA branches perfused and symmetric. Posterior circulation: The left V4 is not visualized. The right V4 is patent to the vertebrobasilar junction. The right  PICA is patent. The left PICA is not visualized. Basilar patent to its distal aspect. Bilateral AICAs patent proximally. Superior cerebellar arteries patent bilaterally. Patent left P1 segment. Fetal origin of the right PCA from the right posterior communicating artery, with mild stenosis proximally (series 1, image 112). Additional mild stenosis in the bilateral P2 segments (series 1, image 116 and 117), both proximally and distally. The left posterior communicating artery may also be patent. Anatomic variants: Fetal origin of the right PCA. MRA NECK FINDINGS Aortic arch: Two-vessel arch with a common origin of the brachiocephalic and left common carotid arteries. Imaged portion shows no evidence of aneurysm or dissection. No significant stenosis of the major arch vessel origins. Right carotid system: No evidence of dissection, occlusion, or hemodynamically significant stenosis (greater than 50%). Left carotid system: No evidence of dissection, occlusion, or hemodynamically significant stenosis (greater  than 50%). Vertebral arteries: The right vertebral artery is patent, without evidence of significant stenosis, dissection, or occlusion. Poor signal in the left vertebral artery from its origin to the skull base, likely indicating significant stenosis. Other: None IMPRESSION: 1. No acute intracranial process. No evidence of acute or subacute infarct. 2. 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 This could be further evaluated with a CTA head and neck clinically indicated. 3. Evaluation of the intracranial vasculature is limited by motion. Within this limitation, there is moderate to severe stenosis in the proximal right cavernous ICA, and mild stenosis in the proximal right supraclinoid ICA, right posterior communicating artery, and bilateral P2 segments. 4. Multifocal irregularity in the intracranial vasculature, with poor visualization of the bilateral A2 segments and M2 and M3 branching, possibly secondary to motion. This could also be evaluated with CTA if indicated. 5. No other hemodynamically significant stenosis in the neck. Electronically Signed   By: Merilyn Baba M.D.   On: 10/29/2022 13:37   CT HEAD WO CONTRAST (5MM)  Result Date: 10/29/2022 CLINICAL DATA:  Right-sided weakness EXAM: CT HEAD WITHOUT CONTRAST TECHNIQUE: Contiguous axial images were obtained from the base of the skull through the vertex without intravenous contrast. RADIATION DOSE REDUCTION: This exam was performed according to the departmental dose-optimization program which includes automated exposure control, adjustment of the mA and/or kV according to patient size and/or use of iterative reconstruction technique. COMPARISON:  None Available. FINDINGS: Brain: No evidence of acute infarction, hemorrhage, mass, mass effect, or midline shift. No hydrocephalus or extra-axial fluid collection. Remote  lacunar infarct in the right basal ganglia. Periventricular white matter changes, likely the sequela of chronic small vessel ischemic disease. Vascular: No hyperdense vessel. Atherosclerotic calcifications in the intracranial carotid and vertebral arteries. Skull: Normal. Negative for fracture or focal lesion. Sinuses/Orbits: Mild mucosal thickening in the right maxillary sinus and ethmoid air cells. Status post bilateral lens replacements. Other: Trace fluid in the right mastoid air cells. IMPRESSION: No acute intracranial process. No evidence of acute infarct or hemorrhage. Electronically Signed   By: Merilyn Baba M.D.   On: 10/29/2022 12:00   DG Chest Port 1 View  Result Date: 10/27/2022 CLINICAL DATA:  Patient with hypoxia. EXAM: PORTABLE CHEST 1 VIEW COMPARISON:  Chest radiograph 07/21/2022 FINDINGS: Patient is kyphotic. Stable cardiac and mediastinal contours. Heterogeneous opacities left mid and lower lung. No pleural effusion there are pneumothorax. IMPRESSION: Heterogeneous opacities left mid and lower lung may represent atelectasis or infection. Electronically Signed  By: Lovey Newcomer M.D.   On: 10/27/2022 17:05   PERIPHERAL VASCULAR CATHETERIZATION  Result Date: 10/26/2022 See surgical note for result.  US Venous Img Lower Unilateral Left  Result Date: 10/26/2022 CLINICAL DATA:  Left lower extremity pain.  Known DVT. EXAM: LEFT LOWER EXTREMITY VENOUS DOPPLER ULTRASOUND TECHNIQUE: Gray-scale sonography with graded compression, as well as color Doppler and duplex ultrasound were performed to evaluate the lower extremity deep venous systems from the level of the common femoral vein and including the common femoral, femoral, profunda femoral, popliteal and calf veins including the posterior tibial, peroneal and gastrocnemius veins when visible. The superficial great saphenous vein was also interrogated. Spectral Doppler was utilized to evaluate flow at rest and with distal augmentation maneuvers  in the common femoral, femoral and popliteal veins. COMPARISON:  Recent lower extremity duplex venous ultrasound 10/19/2022 FINDINGS: Contralateral Common Femoral Vein: Respiratory phasicity is normal and symmetric with the symptomatic side. No evidence of thrombus. Normal compressibility. Common Femoral Vein: No evidence of thrombus. Normal compressibility, respiratory phasicity and response to augmentation. Saphenofemoral Junction: No evidence of thrombus. Normal compressibility and flow on color Doppler imaging. Profunda Femoral Vein: No evidence of thrombus. Normal compressibility and flow on color Doppler imaging. Femoral Vein: No evidence of thrombus. Normal compressibility, respiratory phasicity and response to augmentation. Popliteal Vein: No evidence of thrombus. Normal compressibility, respiratory phasicity and response to augmentation. Calf Veins: Absence of color flow in the posterior tibial veins, similar compared to prior. Findings remain consistent with isolated calf DVT. No evidence of propagation. Superficial Great Saphenous Vein: No evidence of thrombus. Normal compressibility. Venous Reflux:  None. Other Findings:  None. 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 Electronically Signed   By: Jacqulynn Cadet M.D.   On: 10/26/2022 11:46   DG Foot Complete Left  Result Date: 10/26/2022 CLINICAL DATA:  History of DVT.  Foot swelling and discoloration. EXAM: LEFT FOOT - COMPLETE 3+ VIEW COMPARISON:  05/29/2022 FINDINGS: There is diffuse soft tissue edema which appears increased from comparison exam. Postsurgical changes from first ray amputation at the level of the mid metatarsal bone. Second ray amputation has been performed at the level of the proximal phalangeal head. Third ray amputation has been performed at the MTP joint. No sign of acute fracture or dislocation. No focal areas of bone erosion. Plantar heel spur. Vascular calcifications.  IMPRESSION: 1. Diffuse soft tissue edema 2. No acute bone abnormality. 3. Status post first, second and third ray amputations. Electronically Signed   By: Kerby Moors M.D.   On: 10/26/2022 10:46   US Venous Img Lower Unilateral Left  Result Date: 10/19/2022 CLINICAL DATA:  Left calf pain. EXAM: LEFT LOWER EXTREMITY VENOUS DOPPLER ULTRASOUND TECHNIQUE: Gray-scale sonography with graded compression, as well as color Doppler and duplex ultrasound were performed to evaluate the lower extremity deep venous systems from the level of the common femoral vein and including the common femoral, femoral, profunda femoral, popliteal and calf veins including the posterior tibial, peroneal and gastrocnemius veins when visible. The superficial great saphenous vein was also interrogated. Spectral Doppler was utilized to evaluate flow at rest and with distal augmentation maneuvers in the common femoral, femoral and popliteal veins. COMPARISON:  None Available. FINDINGS: Contralateral Common Femoral Vein: Respiratory phasicity is normal and symmetric with the symptomatic side. No evidence of thrombus. Normal compressibility. Common Femoral Vein: No evidence of thrombus. Normal compressibility, respiratory phasicity and response to augmentation. Saphenofemoral Junction: No evidence of thrombus.  Normal compressibility and flow on color Doppler imaging. Profunda Femoral Vein: No evidence of thrombus. Normal compressibility and flow on color Doppler imaging. Femoral Vein: No evidence of thrombus. Normal compressibility, respiratory phasicity and response to augmentation. Popliteal Vein: No evidence of thrombus. Normal compressibility, respiratory phasicity and response to augmentation. Calf Veins: Although the calf veins are very suboptimally visualized, it is suspected that there is thrombus in the left posterior tibial vein. The peroneal veins are not adequately visualized. Superficial Great Saphenous Vein: No evidence of  thrombus. Normal compressibility. Venous Reflux:  None. Other Findings: No evidence of superficial thrombophlebitis or abnormal fluid collection. IMPRESSION: Probable left calf DVT in the left posterior tibial vein. Calf veins are very suboptimally visualized and a follow-up venous duplex study may be warranted. Electronically Signed   By: Aletta Edouard M.D.   On: 10/19/2022 11:34    Microbiology: Results for orders placed or performed during the hospital encounter of 10/26/22  Culture, blood (Routine X 2) w Reflex to ID Panel     Status: None   Collection Time: 10/28/22  2:01 PM   Specimen: BLOOD  Result Value Ref Range Status   Specimen Description BLOOD [facility]  Final   Special Requests   Final    BOTTLES DRAWN AEROBIC AND ANAEROBIC Blood Culture adequate volume   Culture   Final    NO GROWTH 5 DAYS Performed at [facility], [address] [place], [place] [address]    Report Status 11/02/2022 FINAL  Final  Culture, blood (Routine X 2) w Reflex to ID Panel     Status: None   Collection Time: 10/28/22  2:01 PM   Specimen: BLOOD  Result Value Ref Range Status   Specimen Description BLOOD [facility]  Final   Special Requests   Final    BOTTLES DRAWN AEROBIC AND ANAEROBIC Blood Culture adequate volume   Culture   Final    NO GROWTH 5 DAYS Performed at [facility], [address] [place], [place] [address]    Report Status 11/02/2022 FINAL  Final    Labs: CBC: Recent Labs  Lab 10/29/22 0256 10/30/22 1140 10/31/22 0350 11/01/22 0551 11/02/22 0535  WBC 16.9* 16.0* 14.2* 14.2* 16.2*  HGB 8.7* 8.6* 8.2* 8.4* 8.6*  HCT 27.7* 27.0* 25.3* 25.6* 26.3*  MCV 89.4 89.7 87.8 87.1 87.1  PLT 293 355 345 412* 678*   Basic Metabolic Panel: Recent Labs  Lab 10/29/22 0256 10/30/22 1140 10/31/22 0350 11/01/22 0551 11/02/22 0535  NA 134* 135 133* 138 140  K 3.9 3.9 3.7 3.6 3.4*  CL 108 109 109 115* 115*  CO2 19* 18* 17* 20* 19*  GLUCOSE 133* 230* 301* 137* 98  BUN  48* 44* 41* 31* 22  CREATININE 1.77* 1.45* 1.30* 1.10* 0.98  CALCIUM 8.1* 8.4* 8.3* 8.6* 8.6*  MG  --   --   --  1.8  --    Liver Function Tests: Recent Labs  Lab 10/29/22 0256  AST 24  ALT 19  ALKPHOS 90  BILITOT 0.6  PROT 5.3*  ALBUMIN 2.1*   CBG: Recent Labs  Lab 11/01/22 2335 11/02/22 0355 11/02/22 0812 11/02/22 1147 11/02/22 1557  GLUCAP 88 97 119* 133* 163*    Discharge time spent: greater than 30 minutes.  Signed: Ezekiel Slocumb, DO [facility] 11/02/2022

## 2022-11-07 ENCOUNTER — Encounter (INDEPENDENT_AMBULATORY_CARE_PROVIDER_SITE_OTHER): Payer: Medicare HMO

## 2022-11-07 ENCOUNTER — Ambulatory Visit (INDEPENDENT_AMBULATORY_CARE_PROVIDER_SITE_OTHER): Payer: Medicare HMO | Admitting: Nurse Practitioner

## 2022-12-07 DEATH — deceased

## 2023-03-12 ENCOUNTER — Ambulatory Visit (INDEPENDENT_AMBULATORY_CARE_PROVIDER_SITE_OTHER): Payer: Medicare HMO | Admitting: Vascular Surgery

## 2023-03-12 ENCOUNTER — Encounter (INDEPENDENT_AMBULATORY_CARE_PROVIDER_SITE_OTHER): Payer: Medicare HMO
